# Patient Record
Sex: Male | Born: 1975
Health system: Southern US, Community
[De-identification: ages and names within clinical notes are randomized; demographics above are authoritative.]

## PROBLEM LIST (undated history)

## (undated) ENCOUNTER — Emergency Department (HOSPITAL_COMMUNITY): Payer: BC Managed Care – PPO

## (undated) DIAGNOSIS — C140 Malignant neoplasm of pharynx, unspecified: Secondary | ICD-10-CM

## (undated) DIAGNOSIS — E119 Type 2 diabetes mellitus without complications: Secondary | ICD-10-CM

## (undated) DIAGNOSIS — G4700 Insomnia, unspecified: Secondary | ICD-10-CM

## (undated) DIAGNOSIS — L989 Disorder of the skin and subcutaneous tissue, unspecified: Secondary | ICD-10-CM

## (undated) DIAGNOSIS — M1990 Unspecified osteoarthritis, unspecified site: Secondary | ICD-10-CM

## (undated) DIAGNOSIS — N529 Male erectile dysfunction, unspecified: Secondary | ICD-10-CM

## (undated) DIAGNOSIS — K219 Gastro-esophageal reflux disease without esophagitis: Secondary | ICD-10-CM

## (undated) HISTORY — DX: Male erectile dysfunction, unspecified: N52.9

## (undated) HISTORY — DX: Insomnia, unspecified: G47.00

---

## 2005-09-29 ENCOUNTER — Emergency Department: Payer: Self-pay | Admitting: Emergency Medicine

## 2005-11-01 ENCOUNTER — Emergency Department: Payer: Self-pay | Admitting: Emergency Medicine

## 2010-11-06 ENCOUNTER — Emergency Department (HOSPITAL_COMMUNITY)
Admission: EM | Admit: 2010-11-06 | Discharge: 2010-11-06 | Payer: Self-pay | Source: Home / Self Care | Admitting: Emergency Medicine

## 2014-05-23 ENCOUNTER — Encounter (HOSPITAL_COMMUNITY): Payer: Self-pay | Admitting: Emergency Medicine

## 2014-05-23 ENCOUNTER — Emergency Department (HOSPITAL_COMMUNITY)
Admission: EM | Admit: 2014-05-23 | Discharge: 2014-05-24 | Discharge status: Against Medical Advice | Payer: BC Managed Care – PPO | Attending: Emergency Medicine | Admitting: Emergency Medicine

## 2014-05-23 DIAGNOSIS — H9319 Tinnitus, unspecified ear: Secondary | ICD-10-CM | POA: Insufficient documentation

## 2014-05-23 DIAGNOSIS — R0602 Shortness of breath: Secondary | ICD-10-CM | POA: Insufficient documentation

## 2014-05-23 NOTE — ED Notes (Signed)
Call no answer 

## 2014-05-23 NOTE — ED Notes (Addendum)
Radiology transport came twice to transport pt to xray. Paged pt twice for transport no answer.

## 2014-05-23 NOTE — ED Notes (Signed)
Call and no answer

## 2014-05-23 NOTE — ED Notes (Signed)
Presents with SOb and ringing in ears began approx 2 hours associated with nausea and ringing in ears as well as dizziness that caused patient to pull over while driving. He denies tinnitus and SOB at this time. Alert and orietentd.speaking in full sentences.

## 2015-01-24 ENCOUNTER — Ambulatory Visit (INDEPENDENT_AMBULATORY_CARE_PROVIDER_SITE_OTHER): Payer: BLUE CROSS/BLUE SHIELD | Admitting: Internal Medicine

## 2015-01-24 ENCOUNTER — Encounter: Payer: Self-pay | Admitting: Internal Medicine

## 2015-01-24 DIAGNOSIS — Z7189 Other specified counseling: Secondary | ICD-10-CM | POA: Diagnosis not present

## 2015-01-24 DIAGNOSIS — Z789 Other specified health status: Secondary | ICD-10-CM | POA: Diagnosis not present

## 2015-01-24 DIAGNOSIS — G4725 Circadian rhythm sleep disorder, jet lag type: Secondary | ICD-10-CM

## 2015-01-24 DIAGNOSIS — Z23 Encounter for immunization: Secondary | ICD-10-CM

## 2015-01-24 DIAGNOSIS — Z9189 Other specified personal risk factors, not elsewhere classified: Secondary | ICD-10-CM

## 2015-01-24 DIAGNOSIS — Z7184 Encounter for health counseling related to travel: Secondary | ICD-10-CM

## 2015-01-24 MED ORDER — AZITHROMYCIN 500 MG PO TABS: 500.0000 mg | ORAL_TABLET | Freq: Every day | ORAL | Status: DC

## 2015-01-24 MED ORDER — DIAZEPAM 5 MG PO TABS: 5.0000 mg | ORAL_TABLET | Freq: Every evening | ORAL | Status: DC | PRN

## 2015-01-24 NOTE — Progress Notes (Signed)
Patient ID: Justin Baldwin, male   DOB: Mar 13, 1976, 39 y.o.   MRN: [id number] Subjective:   Justin Baldwin is a 39 y.o. male who presents to the Infectious Disease clinic for travel consultation. Planned departure date: may 1st Planned return date: may 9th Countries of travel: [place], [place] and [place] Areas in country: urban Accommodations:  Company secretary and ryoka Purpose of travel: honeymoon Prior travel out of [place]:  [place], [place], and [place]   Allergies: No Known Allergies   Previously prescribed Lorrin Mais that made him feel too groggy. Has tried melatonin without effect. Has usually taken valium   Prior vaccinations = he has had hep b series   Objective:   Medications: does not take any medication    Assessment:    No contraindications to travel.    Plan:   Traveler's diarrhea = will give rx for azithromycin if needed, gave instructions how to use  Jet lag = valium # 5 tabs, if needed  Pre-travel vaccination = gave rx for hep A, recommended he gets tetanus prior to travel

## 2015-01-31 ENCOUNTER — Encounter: Payer: Self-pay | Admitting: Internal Medicine

## 2016-02-05 DIAGNOSIS — F4323 Adjustment disorder with mixed anxiety and depressed mood: Secondary | ICD-10-CM | POA: Diagnosis not present

## 2016-02-11 DIAGNOSIS — J111 Influenza due to unidentified influenza virus with other respiratory manifestations: Secondary | ICD-10-CM | POA: Diagnosis not present

## 2016-02-16 ENCOUNTER — Ambulatory Visit: Payer: BLUE CROSS/BLUE SHIELD | Admitting: Family Medicine

## 2016-02-23 ENCOUNTER — Ambulatory Visit: Payer: BLUE CROSS/BLUE SHIELD | Admitting: Primary Care

## 2016-03-15 ENCOUNTER — Encounter: Payer: Self-pay | Admitting: Primary Care

## 2016-03-15 ENCOUNTER — Ambulatory Visit (INDEPENDENT_AMBULATORY_CARE_PROVIDER_SITE_OTHER): Payer: BLUE CROSS/BLUE SHIELD | Admitting: Primary Care

## 2016-03-15 VITALS — BP 132/78 | HR 79 | Temp 98.4°F | Ht 72.0 in | Wt 214.4 lb

## 2016-03-15 DIAGNOSIS — G4700 Insomnia, unspecified: Secondary | ICD-10-CM

## 2016-03-15 DIAGNOSIS — N529 Male erectile dysfunction, unspecified: Secondary | ICD-10-CM | POA: Diagnosis not present

## 2016-03-15 MED ORDER — TRAZODONE HCL 50 MG PO TABS: 25.0000 mg | ORAL_TABLET | Freq: Every evening | ORAL | Status: DC | PRN

## 2016-03-15 NOTE — Progress Notes (Signed)
Pre visit review using our clinic review tool, if applicable. No additional management support is needed unless otherwise documented below in the visit note. 

## 2016-03-15 NOTE — Assessment & Plan Note (Signed)
Maintenance insomnia for 4-5 months. No improvement with OTC and conservative treatment. Will initiate low dose Trazodone, discussed potential side effects. He is to communicate with me via My Chart with an update.

## 2016-03-15 NOTE — Patient Instructions (Signed)
Start Trazodone tablets for insomnia. Take 1/2 to 1 full tablet at bedtime.  Please e-mail me with an update in several weeks. If you do not notice improvement with 1 tablet, then you may increase to taking 2 tablets.  Please schedule a physical with me within the next 3-6 months. You may also schedule a lab only appointment 3-4 days prior. We will discuss your lab results in detail during your physical.  It was a pleasure to meet you today! Please don't hesitate to call me with any questions. Welcome to [facility]!

## 2016-03-15 NOTE — Progress Notes (Signed)
Subjective:    Patient ID: Justin Baldwin, male    DOB: Dec 26, 1975, 40 y.o.   MRN: [id number]  HPI  Mr. Ajello is a 40 year old male who presents today to establish care and discuss the problems mentioned below. Will obtain old records. His last physical was several years ago.   1) Insomnia: He has no difficulty falling asleep, but will wake up often. He falls asleep at 10pm, wakes at 2:30am, cannot sleep for 1-2 hours, falls back asleep, and wake up again prior to his alarm. This has been ongoing for the past 4-5 months. He denies increased stress or anxiety, caffeine consumption prior to bed, changes in medications or food. He will watch TV prior to falling asleep and has always done without problems in sleep.   He's tried Melatonin, magnesium, vitamins and supplements without much improvement. He's never been on prescription medication for his sleep. Denies snoring, periods of sleep apnea, daytime napping.  2) Erectile Dysfunction: Present for the past 10 years. Currently following with Urology. Managed on Sildenafil 20 mg tablets. He takes the Sildenafil occasionally, not each time he has intercourse.  Review of Systems  Constitutional: Negative for unexpected weight change.  Respiratory: Negative for shortness of breath.   Cardiovascular: Negative for chest pain.  Genitourinary:       Erectile dysfunction  Musculoskeletal: Negative for myalgias and arthralgias.  Allergic/Immunologic: Negative for environmental allergies.  Psychiatric/Behavioral: Positive for sleep disturbance.       Past Medical History  Diagnosis Date  . Erectile dysfunction   . Insomnia      Social History   Social History  . Marital Status: Married    Spouse Name: N/A  . Number of Children: N/A  . Years of Education: N/A   Occupational History  . Not on file.   Social History Main Topics  . Smoking status: Former Smoker    Types: Cigarettes  . Smokeless tobacco: Former Systems developer    Quit date:  02/14/2016  . Alcohol Use: 0.0 oz/week    0 Standard drinks or equivalent per week     Comment: rarely  . Drug Use: No  . Sexual Activity: Not on file   Other Topics Concern  . Not on file   Social History Narrative   Married.   2 children.   Works as a Nutritional therapist   Enjoys spending time with family.     No past surgical history on file.  Family History  Problem Relation Age of Onset  . Heart attack Father   . Emphysema Paternal Grandmother   . Arthritis Mother     No Known Allergies  No current outpatient prescriptions on file prior to visit.   No current facility-administered medications on file prior to visit.    BP 132/78 mmHg  Pulse 79  Temp(Src) 98.4 F (36.9 C) (Oral)  Ht 6' (1.829 m)  Wt 214 lb 6.4 oz (97.251 kg)  BMI 29.07 kg/m2  SpO2 97%    Objective:   Physical Exam  Constitutional: He is oriented to person, place, and time. He appears well-nourished.  Neck: Neck supple.  Cardiovascular: Normal rate and regular rhythm.   Pulmonary/Chest: Effort normal and breath sounds normal. He has no wheezes. He has no rales.  Neurological: He is alert and oriented to person, place, and time.  Skin: Skin is warm and dry.  Psychiatric: He has a normal mood and affect.          Assessment &  Plan:

## 2016-03-15 NOTE — Assessment & Plan Note (Signed)
Manages with Urology, takes Sildenafil 20 mg PRN.

## 2016-03-18 DIAGNOSIS — F4323 Adjustment disorder with mixed anxiety and depressed mood: Secondary | ICD-10-CM | POA: Diagnosis not present

## 2016-03-29 ENCOUNTER — Ambulatory Visit (INDEPENDENT_AMBULATORY_CARE_PROVIDER_SITE_OTHER): Payer: BLUE CROSS/BLUE SHIELD | Admitting: Primary Care

## 2016-03-29 VITALS — BP 134/82 | HR 82 | Temp 98.9°F | Ht 72.0 in | Wt 215.6 lb

## 2016-03-29 DIAGNOSIS — G4700 Insomnia, unspecified: Secondary | ICD-10-CM

## 2016-03-29 DIAGNOSIS — Z87438 Personal history of other diseases of male genital organs: Secondary | ICD-10-CM

## 2016-03-29 DIAGNOSIS — Z0000 Encounter for general adult medical examination without abnormal findings: Secondary | ICD-10-CM | POA: Diagnosis not present

## 2016-03-29 LAB — COMPREHENSIVE METABOLIC PANEL
ALT: 113 U/L — AB (ref 0–53)
AST: 72 U/L — AB (ref 0–37)
Albumin: 4.1 g/dL (ref 3.5–5.2)
Alkaline Phosphatase: 56 U/L (ref 39–117)
BILIRUBIN TOTAL: 0.6 mg/dL (ref 0.2–1.2)
BUN: 19 mg/dL (ref 6–23)
CALCIUM: 9.4 mg/dL (ref 8.4–10.5)
CHLORIDE: 101 meq/L (ref 96–112)
CO2: 31 mEq/L (ref 19–32)
CREATININE: 1.4 mg/dL (ref 0.40–1.50)
GFR: 59.73 mL/min — ABNORMAL LOW (ref >60.00)
Glucose, Bld: 95 mg/dL (ref 70–99)
Potassium: 4.7 mEq/L (ref 3.5–5.1)
Sodium: 139 mEq/L (ref 135–145)
Total Protein: 6.7 g/dL (ref 6.0–8.3)

## 2016-03-29 LAB — LIPID PANEL
CHOLESTEROL: 285 mg/dL — AB (ref 0–200)
HDL: 15.7 mg/dL — ABNORMAL LOW (ref >39.00)
LDL Cholesterol: 230 mg/dL — ABNORMAL HIGH (ref 0–99)
NonHDL: 269.53
Total CHOL/HDL Ratio: 18
Triglycerides: 200 mg/dL — ABNORMAL HIGH (ref 0.0–149.0)
VLDL: 40 mg/dL (ref 0.0–40.0)

## 2016-03-29 LAB — HEMOGLOBIN A1C: HEMOGLOBIN A1C: 4.9 % (ref 4.6–6.5)

## 2016-03-29 LAB — PSA: PSA: 1.17 ng/mL (ref 0.10–4.00)

## 2016-03-29 NOTE — Assessment & Plan Note (Signed)
Td UTD. Labs pending. Will obtain PSA given recurrent prostatitis history.  Exam unremarkable. Encouraged him to increase water consumption, especially given his regular exercise. Healthy diet overall.  Follow up in 1 year for repeat physical or sooner if needed.

## 2016-03-29 NOTE — Progress Notes (Signed)
Subjective:    Patient ID: Justin Baldwin, male    DOB: 1976-08-03, 40 y.o.   MRN: [id number]  HPI  Justin Baldwin is a 40 year old male who presents today for complete physical.  Immunizations: -Tetanus: Completed in May 2016, he believes. -Influenza: Did not complete last season.    Diet: He endorses a healthy diet.  Breakfast: Skips Lunch: Fruit, protein drink, protein bar, lunch meat, peanuts, cheese Dinner: Meat, 2 vegetables Snacks: Protein bar Desserts: Occasionally  Beverages: Water, selezer water  Exercise: He exercises 6 days weekly for 1.5 hours. Eye exam: Completed in 2016, no changes in prescription. Dental exam: Completed in 2017.   Review of Systems  Constitutional: Negative for unexpected weight change.  HENT: Negative for rhinorrhea.   Respiratory: Negative for cough and shortness of breath.   Cardiovascular: Negative for chest pain.  Gastrointestinal: Positive for constipation. Negative for diarrhea.       Bowel movements every 2-3 days.  Genitourinary: Negative for discharge, difficulty urinating and testicular pain.       History of acute prostatitis since 20's. History of epididymitis.   Musculoskeletal: Negative for myalgias and arthralgias.  Skin: Negative for rash.  Allergic/Immunologic: Negative for environmental allergies.  Neurological: Negative for dizziness, numbness and headaches.  Psychiatric/Behavioral:       Denies concerns for anxiety or depression       Past Medical History  Diagnosis Date  . Erectile dysfunction   . Insomnia      Social History   Social History  . Marital Status: Married    Spouse Name: N/A  . Number of Children: N/A  . Years of Education: N/A   Occupational History  . Not on file.   Social History Main Topics  . Smoking status: Former Smoker    Types: Cigarettes  . Smokeless tobacco: Former Systems developer    Quit date: 02/14/2016  . Alcohol Use: 0.0 oz/week    0 Standard drinks or equivalent per week   Comment: rarely  . Drug Use: No  . Sexual Activity: Not on file   Other Topics Concern  . Not on file   Social History Narrative   Married.   2 children.   Works as a Nutritional therapist   Enjoys spending time with family.     No past surgical history on file.  Family History  Problem Relation Age of Onset  . Heart attack Father   . Emphysema Paternal Grandmother   . Arthritis Mother     No Known Allergies  Current Outpatient Prescriptions on File Prior to Visit  Medication Sig Dispense Refill  . sildenafil (REVATIO) 20 MG tablet Take 20 mg by mouth 3 (three) times daily. Take 1 tablet by mouth prior to intercourse    . traZODone (DESYREL) 50 MG tablet Take 0.5-1 tablets (25-50 mg total) by mouth at bedtime as needed for sleep. 30 tablet 3   No current facility-administered medications on file prior to visit.    BP 134/82 mmHg  Pulse 82  Temp(Src) 98.9 F (37.2 C) (Oral)  Ht 6' (1.829 m)  Wt 215 lb 9.6 oz (97.796 kg)  BMI 29.23 kg/m2  SpO2 97%    Objective:   Physical Exam  Constitutional: He is oriented to person, place, and time. He appears well-nourished.  HENT:  Right Ear: Tympanic membrane and ear canal normal.  Left Ear: Tympanic membrane and ear canal normal.  Nose: Nose normal. Right sinus exhibits no maxillary sinus tenderness and no  frontal sinus tenderness. Left sinus exhibits no maxillary sinus tenderness and no frontal sinus tenderness.  Mouth/Throat: Oropharynx is clear and moist.  Eyes: Conjunctivae and EOM are normal. Pupils are equal, round, and reactive to light.  Neck: Neck supple. Carotid bruit is not present. No thyromegaly present.  Cardiovascular: Normal rate, regular rhythm and normal heart sounds.   Pulmonary/Chest: Effort normal and breath sounds normal. He has no wheezes. He has no rales.  Abdominal: Soft. Bowel sounds are normal. There is no tenderness.  Genitourinary: Penis normal. No penile tenderness.  Small 1/8 size circular nevus to  skin connecting scrotum to penis. Appears benign.  Chaperone present.  Musculoskeletal: Normal range of motion.  Neurological: He is alert and oriented to person, place, and time. He has normal reflexes. No cranial nerve deficit.  Skin: Skin is warm and dry.  Psychiatric: He has a normal mood and affect.          Assessment & Plan:

## 2016-03-29 NOTE — Progress Notes (Signed)
Pre visit review using our clinic review tool, if applicable. No additional management support is needed unless otherwise documented below in the visit note. 

## 2016-03-29 NOTE — Assessment & Plan Note (Signed)
Improved on 75 mg of Trazodone, however feels groggy the next day. Will have him take 50 mg with melatonin. He is to update me if no improvement.

## 2016-03-29 NOTE — Patient Instructions (Signed)
Complete lab work prior to leaving today. I will notify you of your results once received.   Ensure you are consuming 64 ounces of water daily, especially since you are eating increased quantities of fiber.  Follow up in 1 year for repeat physical or sooner if needed.  It was a pleasure to see you today!

## 2016-03-31 DIAGNOSIS — A630 Anogenital (venereal) warts: Secondary | ICD-10-CM | POA: Diagnosis not present

## 2016-04-22 DIAGNOSIS — F4323 Adjustment disorder with mixed anxiety and depressed mood: Secondary | ICD-10-CM | POA: Diagnosis not present

## 2016-05-06 DIAGNOSIS — F4323 Adjustment disorder with mixed anxiety and depressed mood: Secondary | ICD-10-CM | POA: Diagnosis not present

## 2016-05-10 DIAGNOSIS — N5201 Erectile dysfunction due to arterial insufficiency: Secondary | ICD-10-CM | POA: Diagnosis not present

## 2016-05-10 DIAGNOSIS — R339 Retention of urine, unspecified: Secondary | ICD-10-CM | POA: Diagnosis not present

## 2016-05-10 DIAGNOSIS — Z6830 Body mass index (BMI) 30.0-30.9, adult: Secondary | ICD-10-CM | POA: Diagnosis not present

## 2016-05-20 DIAGNOSIS — F4323 Adjustment disorder with mixed anxiety and depressed mood: Secondary | ICD-10-CM | POA: Diagnosis not present

## 2016-05-25 DIAGNOSIS — L821 Other seborrheic keratosis: Secondary | ICD-10-CM | POA: Diagnosis not present

## 2016-05-25 DIAGNOSIS — L080 Pyoderma: Secondary | ICD-10-CM | POA: Diagnosis not present

## 2016-05-25 DIAGNOSIS — L089 Local infection of the skin and subcutaneous tissue, unspecified: Secondary | ICD-10-CM | POA: Diagnosis not present

## 2016-05-25 DIAGNOSIS — D239 Other benign neoplasm of skin, unspecified: Secondary | ICD-10-CM | POA: Diagnosis not present

## 2016-06-04 DIAGNOSIS — Z79899 Other long term (current) drug therapy: Secondary | ICD-10-CM | POA: Diagnosis not present

## 2016-06-04 DIAGNOSIS — L709 Acne, unspecified: Secondary | ICD-10-CM | POA: Diagnosis not present

## 2016-07-20 DIAGNOSIS — L709 Acne, unspecified: Secondary | ICD-10-CM | POA: Diagnosis not present

## 2016-07-20 DIAGNOSIS — Z5181 Encounter for therapeutic drug level monitoring: Secondary | ICD-10-CM | POA: Diagnosis not present

## 2016-07-20 DIAGNOSIS — Z79899 Other long term (current) drug therapy: Secondary | ICD-10-CM | POA: Diagnosis not present

## 2016-07-29 DIAGNOSIS — F4323 Adjustment disorder with mixed anxiety and depressed mood: Secondary | ICD-10-CM | POA: Diagnosis not present

## 2016-08-12 DIAGNOSIS — F4323 Adjustment disorder with mixed anxiety and depressed mood: Secondary | ICD-10-CM | POA: Diagnosis not present

## 2016-08-25 DIAGNOSIS — Z79899 Other long term (current) drug therapy: Secondary | ICD-10-CM | POA: Diagnosis not present

## 2016-08-25 DIAGNOSIS — L732 Hidradenitis suppurativa: Secondary | ICD-10-CM | POA: Diagnosis not present

## 2016-09-29 DIAGNOSIS — L732 Hidradenitis suppurativa: Secondary | ICD-10-CM | POA: Diagnosis not present

## 2016-09-29 DIAGNOSIS — L700 Acne vulgaris: Secondary | ICD-10-CM | POA: Diagnosis not present

## 2016-11-02 DIAGNOSIS — L732 Hidradenitis suppurativa: Secondary | ICD-10-CM | POA: Diagnosis not present

## 2016-11-02 DIAGNOSIS — L709 Acne, unspecified: Secondary | ICD-10-CM | POA: Diagnosis not present

## 2016-12-08 DIAGNOSIS — L732 Hidradenitis suppurativa: Secondary | ICD-10-CM | POA: Diagnosis not present

## 2016-12-08 DIAGNOSIS — L700 Acne vulgaris: Secondary | ICD-10-CM | POA: Diagnosis not present

## 2016-12-21 DIAGNOSIS — R300 Dysuria: Secondary | ICD-10-CM | POA: Diagnosis not present

## 2016-12-21 DIAGNOSIS — Z113 Encounter for screening for infections with a predominantly sexual mode of transmission: Secondary | ICD-10-CM | POA: Diagnosis not present

## 2017-01-11 DIAGNOSIS — L700 Acne vulgaris: Secondary | ICD-10-CM | POA: Diagnosis not present

## 2017-01-11 DIAGNOSIS — L709 Acne, unspecified: Secondary | ICD-10-CM | POA: Diagnosis not present

## 2017-01-11 DIAGNOSIS — F4325 Adjustment disorder with mixed disturbance of emotions and conduct: Secondary | ICD-10-CM | POA: Diagnosis not present

## 2017-01-14 ENCOUNTER — Encounter: Payer: BLUE CROSS/BLUE SHIELD | Admitting: Internal Medicine

## 2017-01-20 DIAGNOSIS — F4325 Adjustment disorder with mixed disturbance of emotions and conduct: Secondary | ICD-10-CM | POA: Diagnosis not present

## 2017-01-24 DIAGNOSIS — F4325 Adjustment disorder with mixed disturbance of emotions and conduct: Secondary | ICD-10-CM | POA: Diagnosis not present

## 2017-01-27 DIAGNOSIS — F4325 Adjustment disorder with mixed disturbance of emotions and conduct: Secondary | ICD-10-CM | POA: Diagnosis not present

## 2017-01-28 ENCOUNTER — Encounter: Payer: BLUE CROSS/BLUE SHIELD | Admitting: Internal Medicine

## 2017-02-04 DIAGNOSIS — F4325 Adjustment disorder with mixed disturbance of emotions and conduct: Secondary | ICD-10-CM | POA: Diagnosis not present

## 2017-02-10 DIAGNOSIS — F4325 Adjustment disorder with mixed disturbance of emotions and conduct: Secondary | ICD-10-CM | POA: Diagnosis not present

## 2017-02-15 DIAGNOSIS — Z79899 Other long term (current) drug therapy: Secondary | ICD-10-CM | POA: Diagnosis not present

## 2017-02-15 DIAGNOSIS — L700 Acne vulgaris: Secondary | ICD-10-CM | POA: Diagnosis not present

## 2017-02-15 DIAGNOSIS — L910 Hypertrophic scar: Secondary | ICD-10-CM | POA: Diagnosis not present

## 2017-02-18 DIAGNOSIS — F4325 Adjustment disorder with mixed disturbance of emotions and conduct: Secondary | ICD-10-CM | POA: Diagnosis not present

## 2017-02-25 DIAGNOSIS — F4325 Adjustment disorder with mixed disturbance of emotions and conduct: Secondary | ICD-10-CM | POA: Diagnosis not present

## 2017-02-28 DIAGNOSIS — N5201 Erectile dysfunction due to arterial insufficiency: Secondary | ICD-10-CM | POA: Diagnosis not present

## 2017-02-28 DIAGNOSIS — Z6832 Body mass index (BMI) 32.0-32.9, adult: Secondary | ICD-10-CM | POA: Diagnosis not present

## 2017-03-04 DIAGNOSIS — F4325 Adjustment disorder with mixed disturbance of emotions and conduct: Secondary | ICD-10-CM | POA: Diagnosis not present

## 2017-03-07 DIAGNOSIS — F4322 Adjustment disorder with anxiety: Secondary | ICD-10-CM | POA: Diagnosis not present

## 2017-03-10 DIAGNOSIS — F4325 Adjustment disorder with mixed disturbance of emotions and conduct: Secondary | ICD-10-CM | POA: Diagnosis not present

## 2017-03-14 DIAGNOSIS — F4322 Adjustment disorder with anxiety: Secondary | ICD-10-CM | POA: Diagnosis not present

## 2017-03-22 DIAGNOSIS — L709 Acne, unspecified: Secondary | ICD-10-CM | POA: Diagnosis not present

## 2017-03-24 DIAGNOSIS — F4325 Adjustment disorder with mixed disturbance of emotions and conduct: Secondary | ICD-10-CM | POA: Diagnosis not present

## 2017-03-28 DIAGNOSIS — F4322 Adjustment disorder with anxiety: Secondary | ICD-10-CM | POA: Diagnosis not present

## 2017-04-04 DIAGNOSIS — F4322 Adjustment disorder with anxiety: Secondary | ICD-10-CM | POA: Diagnosis not present

## 2017-04-08 DIAGNOSIS — F4325 Adjustment disorder with mixed disturbance of emotions and conduct: Secondary | ICD-10-CM | POA: Diagnosis not present

## 2017-04-18 DIAGNOSIS — F4322 Adjustment disorder with anxiety: Secondary | ICD-10-CM | POA: Diagnosis not present

## 2017-04-21 DIAGNOSIS — F4325 Adjustment disorder with mixed disturbance of emotions and conduct: Secondary | ICD-10-CM | POA: Diagnosis not present

## 2017-04-25 DIAGNOSIS — F4322 Adjustment disorder with anxiety: Secondary | ICD-10-CM | POA: Diagnosis not present

## 2017-04-26 DIAGNOSIS — L723 Sebaceous cyst: Secondary | ICD-10-CM | POA: Diagnosis not present

## 2017-04-26 DIAGNOSIS — L709 Acne, unspecified: Secondary | ICD-10-CM | POA: Diagnosis not present

## 2017-05-02 DIAGNOSIS — F4322 Adjustment disorder with anxiety: Secondary | ICD-10-CM | POA: Diagnosis not present

## 2017-05-05 DIAGNOSIS — F4325 Adjustment disorder with mixed disturbance of emotions and conduct: Secondary | ICD-10-CM | POA: Diagnosis not present

## 2017-05-09 DIAGNOSIS — F4322 Adjustment disorder with anxiety: Secondary | ICD-10-CM | POA: Diagnosis not present

## 2017-05-11 ENCOUNTER — Ambulatory Visit (INDEPENDENT_AMBULATORY_CARE_PROVIDER_SITE_OTHER): Payer: BLUE CROSS/BLUE SHIELD | Admitting: Primary Care

## 2017-05-11 VITALS — BP 126/84 | HR 70 | Temp 98.4°F | Ht 72.0 in | Wt 236.4 lb

## 2017-05-11 DIAGNOSIS — Z2089 Contact with and (suspected) exposure to other communicable diseases: Secondary | ICD-10-CM

## 2017-05-11 DIAGNOSIS — R059 Cough, unspecified: Secondary | ICD-10-CM

## 2017-05-11 DIAGNOSIS — R05 Cough: Secondary | ICD-10-CM

## 2017-05-11 DIAGNOSIS — Z20828 Contact with and (suspected) exposure to other viral communicable diseases: Secondary | ICD-10-CM | POA: Diagnosis not present

## 2017-05-11 MED ORDER — AZITHROMYCIN 250 MG PO TABS: ORAL_TABLET | ORAL | 0 refills | Status: DC

## 2017-05-11 NOTE — Progress Notes (Signed)
Subjective:    Patient ID: Justin Baldwin, male    DOB: 06-04-1976, 41 y.o.   MRN: [id number]  HPI  Mr. Rawl is a 41 year old male who presents today with a chief complaint of cough. He also reports nasal congestion, voice hoarseness, burning in his chest. His symptoms began 3-4 days ago. His 41 year old daughter was diagnosed with walking pneumonia on Monday this week and was treated. Her daughter has been coughing for 2 weeks. His cough is non productive. He denies fevers. He's also managed on Humira. He will be leaving for the beach next week.  Review of Systems  Constitutional: Positive for chills and fatigue. Negative for fever.  HENT: Positive for congestion. Negative for ear pain and sore throat.   Respiratory: Positive for cough. Negative for shortness of breath and wheezing.        Past Medical History:  Diagnosis Date  . Erectile dysfunction   . Insomnia      Social History   Social History  . Marital status: Married    Spouse name: N/A  . Number of children: N/A  . Years of education: N/A   Occupational History  . Not on file.   Social History Main Topics  . Smoking status: Former Smoker    Types: Cigarettes  . Smokeless tobacco: Former Systems developer    Quit date: 02/14/2016  . Alcohol use 0.0 oz/week     Comment: rarely  . Drug use: No  . Sexual activity: Not on file   Other Topics Concern  . Not on file   Social History Narrative   Married.   2 children.   Works as a Nutritional therapist   Enjoys spending time with family.     No past surgical history on file.  Family History  Problem Relation Age of Onset  . Heart attack Father   . Emphysema Paternal Grandmother   . Arthritis Mother     No Known Allergies  Current Outpatient Prescriptions on File Prior to Visit  Medication Sig Dispense Refill  . traZODone (DESYREL) 50 MG tablet Take 0.5-1 tablets (25-50 mg total) by mouth at bedtime as needed for sleep. (Patient not taking: Reported on 05/11/2017) 30  tablet 3   No current facility-administered medications on file prior to visit.     BP 126/84   Pulse 70   Temp 98.4 F (36.9 C) (Oral)   Ht 6' (1.829 m)   Wt 236 lb 6.4 oz (107.2 kg)   SpO2 98%   BMI 32.06 kg/m    Objective:   Physical Exam  Constitutional: He appears well-nourished. He appears ill.  HENT:  Right Ear: Tympanic membrane and ear canal normal.  Left Ear: Tympanic membrane and ear canal normal.  Nose: No mucosal edema. Right sinus exhibits no maxillary sinus tenderness and no frontal sinus tenderness. Left sinus exhibits no maxillary sinus tenderness and no frontal sinus tenderness.  Mouth/Throat: Oropharynx is clear and moist.  Eyes: Conjunctivae are normal.  Neck: Neck supple.  Cardiovascular: Normal rate and regular rhythm.   Pulmonary/Chest: Effort normal and breath sounds normal. He has no wheezes. He has no rales.  Skin: Skin is warm and dry.          Assessment & Plan:  URI vs Early Pneumonia:  Exposed to pneumonia by daughter who's been coughing for 2 weeks. He's now feeling worse, managed on Humira.  Exam today overall unremarkable, does appear ill. Given exposure, presentation, and Humira will  treat. Rx for Zpak sent to pharmacy. Discussed fluids, rest, follow up PRN.  Sheral Flow, NP

## 2017-05-11 NOTE — Patient Instructions (Signed)
Start Azithromycin antibiotics. Take 2 tablets by mouth today, then 1 tablet daily for 4 additional days.  Try Delsym or Robitussin over the counter as needed for cough.  Try Mucinex for chest congestion.  Ensure you are staying hydrated and rest.  It was a pleasure to see you today!

## 2017-05-23 DIAGNOSIS — F4325 Adjustment disorder with mixed disturbance of emotions and conduct: Secondary | ICD-10-CM | POA: Diagnosis not present

## 2017-05-26 DIAGNOSIS — F4325 Adjustment disorder with mixed disturbance of emotions and conduct: Secondary | ICD-10-CM | POA: Diagnosis not present

## 2017-05-31 DIAGNOSIS — L732 Hidradenitis suppurativa: Secondary | ICD-10-CM | POA: Diagnosis not present

## 2017-05-31 DIAGNOSIS — L700 Acne vulgaris: Secondary | ICD-10-CM | POA: Diagnosis not present

## 2017-06-06 DIAGNOSIS — F4322 Adjustment disorder with anxiety: Secondary | ICD-10-CM | POA: Diagnosis not present

## 2017-06-09 DIAGNOSIS — F4325 Adjustment disorder with mixed disturbance of emotions and conduct: Secondary | ICD-10-CM | POA: Diagnosis not present

## 2017-06-13 DIAGNOSIS — F4322 Adjustment disorder with anxiety: Secondary | ICD-10-CM | POA: Diagnosis not present

## 2017-06-24 DIAGNOSIS — F4325 Adjustment disorder with mixed disturbance of emotions and conduct: Secondary | ICD-10-CM | POA: Diagnosis not present

## 2017-06-30 DIAGNOSIS — F4325 Adjustment disorder with mixed disturbance of emotions and conduct: Secondary | ICD-10-CM | POA: Diagnosis not present

## 2017-07-04 DIAGNOSIS — F4322 Adjustment disorder with anxiety: Secondary | ICD-10-CM | POA: Diagnosis not present

## 2017-07-05 ENCOUNTER — Emergency Department (HOSPITAL_COMMUNITY)
Admission: EM | Admit: 2017-07-05 | Discharge: 2017-07-05 | Disposition: A | Discharge status: Home / Self Care | Payer: BLUE CROSS/BLUE SHIELD | Attending: Emergency Medicine | Admitting: Emergency Medicine

## 2017-07-05 ENCOUNTER — Emergency Department (HOSPITAL_COMMUNITY): Payer: BLUE CROSS/BLUE SHIELD

## 2017-07-05 ENCOUNTER — Encounter (HOSPITAL_COMMUNITY): Payer: Self-pay | Admitting: Cardiology

## 2017-07-05 DIAGNOSIS — H8112 Benign paroxysmal vertigo, left ear: Secondary | ICD-10-CM | POA: Diagnosis not present

## 2017-07-05 DIAGNOSIS — R110 Nausea: Secondary | ICD-10-CM | POA: Insufficient documentation

## 2017-07-05 DIAGNOSIS — F17210 Nicotine dependence, cigarettes, uncomplicated: Secondary | ICD-10-CM | POA: Insufficient documentation

## 2017-07-05 DIAGNOSIS — Z79899 Other long term (current) drug therapy: Secondary | ICD-10-CM | POA: Diagnosis not present

## 2017-07-05 DIAGNOSIS — H81393 Other peripheral vertigo, bilateral: Secondary | ICD-10-CM | POA: Diagnosis not present

## 2017-07-05 DIAGNOSIS — R42 Dizziness and giddiness: Secondary | ICD-10-CM | POA: Diagnosis not present

## 2017-07-05 DIAGNOSIS — J3489 Other specified disorders of nose and nasal sinuses: Secondary | ICD-10-CM | POA: Diagnosis not present

## 2017-07-05 HISTORY — DX: Disorder of the skin and subcutaneous tissue, unspecified: L98.9

## 2017-07-05 MED ORDER — ONDANSETRON 4 MG PO TBDP: 4.0000 mg | ORAL_TABLET | Freq: Three times a day (TID) | ORAL | 0 refills | Status: DC | PRN

## 2017-07-05 MED ORDER — DIAZEPAM 5 MG PO TABS: 5.0000 mg | ORAL_TABLET | Freq: Two times a day (BID) | ORAL | 0 refills | Status: DC | PRN

## 2017-07-05 MED ORDER — MECLIZINE HCL 25 MG PO TABS: 25.0000 mg | ORAL_TABLET | Freq: Three times a day (TID) | ORAL | 0 refills | Status: DC | PRN

## 2017-07-05 MED ORDER — ONDANSETRON HCL 4 MG/2ML IJ SOLN
4.0000 mg | Freq: Once | INTRAMUSCULAR | Status: AC
  Administered 2017-07-05: 4 mg via INTRAVENOUS
  Filled 2017-07-05: qty 2

## 2017-07-05 MED ORDER — FLUTICASONE PROPIONATE 50 MCG/ACT NA SUSP: NASAL | 1 refills | Status: DC

## 2017-07-05 MED ORDER — ONDANSETRON 4 MG PO TBDP
ORAL_TABLET | ORAL | Status: AC
  Filled 2017-07-05: qty 1

## 2017-07-05 MED ORDER — MECLIZINE HCL 12.5 MG PO TABS
25.0000 mg | ORAL_TABLET | Freq: Once | ORAL | Status: AC
  Administered 2017-07-05: 25 mg via ORAL
  Filled 2017-07-05: qty 2

## 2017-07-05 MED ORDER — DIPHENHYDRAMINE HCL 50 MG/ML IJ SOLN
25.0000 mg | Freq: Once | INTRAMUSCULAR | Status: AC
  Administered 2017-07-05: 25 mg via INTRAVENOUS
  Filled 2017-07-05: qty 1

## 2017-07-05 MED ORDER — ONDANSETRON 4 MG PO TBDP: 4.0000 mg | ORAL_TABLET | Freq: Once | ORAL | Status: DC

## 2017-07-05 NOTE — ED Triage Notes (Signed)
Dizziness since last night at 730 pm.  Denies any pain.

## 2017-07-05 NOTE — ED Provider Notes (Signed)
[facility] DEPT Provider Note   CSN: [id number] Arrival date & time: 07/05/17  1006     History   Chief Complaint Chief Complaint  Patient presents with  . Dizziness    HPI Justin Baldwin is a 41 y.o. male. Chief complaint is dizziness, nausea  HPI:  41 year old male. History of hidradenitis supra T Eva. He is on Humira for this. He was in his normal state of health until a few hours ago. He states he hasn't felt a runny nose and some cold symptoms. He states he "sniffled hard". He self sudden rush her in his ears into his face. He developed sudden severe dizziness. This described as like things are spinning around. He can look to his left. Awakens to move his head arrives to his right he has worsening of symptoms. No headache. No neck pain. No recent fever. Has some seasonal allergies. Has not had other more significant URI symptoms until his episode today.  Past Medical History:  Diagnosis Date  . Erectile dysfunction   . Insomnia   . Skin abnormality    hidradenitis suppurativa     Patient Active Problem List   Diagnosis Date Noted  . Preventative health care 03/29/2016  . Insomnia 03/15/2016  . Erectile dysfunction 03/15/2016    History reviewed. No pertinent surgical history.     Home Medications    Prior to Admission medications   Medication Sig Start Date End Date Taking? Authorizing Provider  Adalimumab (HUMIRA) 40 MG/0.8ML PSKT Inject 40 mg into the skin.   Yes [provider]  CLARAVIS 40 MG capsule Take 1 capsule every other day 06/04/17  Yes [provider]  azithromycin (ZITHROMAX) 250 MG tablet Take 2 tablets by mouth today, then 1 tablet daily for 4 additional days. Patient not taking: Reported on 07/05/2017 05/11/17   Pleas [name], NP  diazepam (VALIUM) 5 MG tablet Take 1 tablet (5 mg total) by mouth every 12 (twelve) hours as needed (vertigo). 07/05/17   Tanna Furry, MD  fluticasone Asencion Islam) 50 MCG/ACT nasal spray 1 spray each  nares bid 07/05/17   Tanna Furry, MD  meclizine (ANTIVERT) 25 MG tablet Take 1 tablet (25 mg total) by mouth 3 (three) times daily as needed. 07/05/17   Tanna Furry, MD  ondansetron (ZOFRAN ODT) 4 MG disintegrating tablet Take 1 tablet (4 mg total) by mouth every 8 (eight) hours as needed for nausea. 07/05/17   Tanna Furry, MD    Family History Family History  Problem Relation Age of Onset  . Heart attack Father   . Emphysema Paternal Grandmother   . Arthritis Mother     Social History Social History  Substance Use Topics  . Smoking status: Current Every Day Smoker    Types: Cigarettes  . Smokeless tobacco: Former Systems developer    Quit date: 02/14/2016  . Alcohol use 0.0 oz/week     Comment: occasinal      Allergies   Patient has no known allergies.   Review of Systems Review of Systems  Constitutional: Negative for appetite change, chills, diaphoresis, fatigue and fever.  HENT: Positive for postnasal drip, rhinorrhea and sinus pain. Negative for mouth sores, sore throat and trouble swallowing.   Eyes: Negative for visual disturbance.  Respiratory: Negative for cough, chest tightness, shortness of breath and wheezing.   Cardiovascular: Negative for chest pain.  Gastrointestinal: Positive for nausea. Negative for abdominal distention, abdominal pain, diarrhea and vomiting.  Endocrine: Negative for polydipsia, polyphagia and polyuria.  Genitourinary:  Negative for dysuria, frequency and hematuria.  Musculoskeletal: Negative for gait problem.  Skin: Negative for color change, pallor and rash.  Neurological: Positive for dizziness. Negative for syncope, light-headedness and headaches.  Hematological: Does not bruise/bleed easily.  Psychiatric/Behavioral: Negative for behavioral problems and confusion.     Physical Exam Updated Vital Signs BP 134/86   Pulse 67   Temp 98.5 F (36.9 C) (Oral)   Resp 16   Ht 6' (1.829 m)   Wt 104.8 kg (231 lb)   SpO2 97%   BMI 31.33 kg/m    Physical Exam  Constitutional: He is oriented to person, place, and time. He appears well-developed and well-nourished. No distress.  HENT:  Head: Normocephalic.  TMs normal. Nares clear. Pharynx benign. He does not have excellent air movement palsy's. He does have nystagmus to lateral to lateral gaze. Horizontal. No vertical or rotatory nystagmus. No additional cranial nerve symptoms.  Eyes: Pupils are equal, round, and reactive to light. Conjunctivae are normal. No scleral icterus.  Neck: Normal range of motion. Neck supple. No thyromegaly present.  Cardiovascular: Normal rate and regular rhythm.  Exam reveals no gallop and no friction rub.   No murmur heard. Pulmonary/Chest: Effort normal and breath sounds normal. No respiratory distress. He has no wheezes. He has no rales.  Abdominal: Soft. Bowel sounds are normal. He exhibits no distension. There is no tenderness. There is no rebound.  Musculoskeletal: Normal range of motion.  Neurological: He is alert and oriented to person, place, and time.  Skin: Skin is warm and dry. No rash noted.  Psychiatric: He has a normal mood and affect. His behavior is normal.     ED Treatments / Results  Labs (all labs ordered are listed, but only abnormal results are displayed) Labs Reviewed - No data to display  EKG  EKG Interpretation None       Radiology Ct Head Wo Contrast  Result Date: 07/05/2017 CLINICAL DATA:  Vertigo.  Dizziness and facial pain. EXAM: CT HEAD WITHOUT CONTRAST CT MAXILLOFACIAL WITHOUT CONTRAST TECHNIQUE: Multidetector CT imaging of the head and maxillofacial structures were performed using the standard protocol without intravenous contrast. Multiplanar CT image reconstructions of the maxillofacial structures were also generated. COMPARISON:  None. FINDINGS: CT HEAD FINDINGS Brain: The brainstem, cerebellum, cerebral peduncles, thalami, basal ganglia, basilar cisterns, and ventricular system appear within normal limits.  No intracranial hemorrhage, mass lesion, or acute CVA. Vascular: Unremarkable Skull: Unremarkable. No cerebellopontine angle mass or obvious asymmetry of the internal auditory canals. Other: No supplemental non-categorized findings. CT MAXILLOFACIAL FINDINGS Osseous: I suspect a small remote left medial orbital wall fracture, images 29-30 series 7. Not thought to be acute. Orbits: Unremarkable Sinuses: Small bilateral mastoid effusions, right greater than left. No middle ear fluid. Mild bilateral ethmoid sinusitis. Soft tissues: Unremarkable. IMPRESSION: 1. A specific cause for the patient's facial pain and dizziness/vertigo is not identified. 2. Small bilateral mastoid effusions, right greater than left. Mild bilateral ethmoid sinusitis. 3. There is potentially a small remote left medial orbital wall fracture. Electronically Signed   By: Van Clines M.D.   On: 07/05/2017 12:28   Ct Maxillofacial Wo Contrast  Result Date: 07/05/2017 CLINICAL DATA:  Vertigo.  Dizziness and facial pain. EXAM: CT HEAD WITHOUT CONTRAST CT MAXILLOFACIAL WITHOUT CONTRAST TECHNIQUE: Multidetector CT imaging of the head and maxillofacial structures were performed using the standard protocol without intravenous contrast. Multiplanar CT image reconstructions of the maxillofacial structures were also generated. COMPARISON:  None. FINDINGS: CT HEAD FINDINGS Brain:  The brainstem, cerebellum, cerebral peduncles, thalami, basal ganglia, basilar cisterns, and ventricular system appear within normal limits. No intracranial hemorrhage, mass lesion, or acute CVA. Vascular: Unremarkable Skull: Unremarkable. No cerebellopontine angle mass or obvious asymmetry of the internal auditory canals. Other: No supplemental non-categorized findings. CT MAXILLOFACIAL FINDINGS Osseous: I suspect a small remote left medial orbital wall fracture, images 29-30 series 7. Not thought to be acute. Orbits: Unremarkable Sinuses: Small bilateral mastoid  effusions, right greater than left. No middle ear fluid. Mild bilateral ethmoid sinusitis. Soft tissues: Unremarkable. IMPRESSION: 1. A specific cause for the patient's facial pain and dizziness/vertigo is not identified. 2. Small bilateral mastoid effusions, right greater than left. Mild bilateral ethmoid sinusitis. 3. There is potentially a small remote left medial orbital wall fracture. Electronically Signed   By: Van Clines M.D.   On: 07/05/2017 12:28    Procedures Procedures (including critical care time)  Medications Ordered in ED Medications  diphenhydrAMINE (BENADRYL) injection 25 mg (25 mg Intravenous Given 07/05/17 1141)  ondansetron (ZOFRAN) injection 4 mg (4 mg Intravenous Given 07/05/17 1142)  meclizine (ANTIVERT) tablet 25 mg (25 mg Oral Given 07/05/17 1142)     Initial Impression / Assessment and Plan / ED Course  I have reviewed the triage vital signs and the nursing notes.  Pertinent labs & imaging results that were available during my care of the patient were reviewed by me and considered in my medical decision making (see chart for details).   CT of head and maxillofacial obtained. Primarily because of patient's immunosuppression make sure there was not marked sinus involvement that would suggest additional pathology. He showed no acute findings. He was given IV Benadryl, and Zofran. Given by mouth meclizine. His symptoms have improved he is able to stand and ambulate. Plan is home, Flonase, meclizine, when necessary Zofran, when necessary nightly Valium. Driving precautions, vertigo precautions.  Final Clinical Impressions(s) / ED Diagnoses   Final diagnoses:  Peripheral vertigo of both ears    New Prescriptions New Prescriptions   DIAZEPAM (VALIUM) 5 MG TABLET    Take 1 tablet (5 mg total) by mouth every 12 (twelve) hours as needed (vertigo).   FLUTICASONE (FLONASE) 50 MCG/ACT NASAL SPRAY    1 spray each nares bid   MECLIZINE (ANTIVERT) 25 MG TABLET    Take 1  tablet (25 mg total) by mouth 3 (three) times daily as needed.   ONDANSETRON (ZOFRAN ODT) 4 MG DISINTEGRATING TABLET    Take 1 tablet (4 mg total) by mouth every 8 (eight) hours as needed for nausea.     Tanna Furry, MD 07/05/17 1309

## 2017-07-05 NOTE — Discharge Instructions (Signed)
Take meclizine until 24 hours without vertigo.  No driving until 24 hours without vertigo.  Valium as needed for severe symptoms, or symptoms at night.

## 2017-07-11 ENCOUNTER — Ambulatory Visit (INDEPENDENT_AMBULATORY_CARE_PROVIDER_SITE_OTHER): Payer: BLUE CROSS/BLUE SHIELD | Admitting: Primary Care

## 2017-07-11 ENCOUNTER — Encounter: Payer: Self-pay | Admitting: Primary Care

## 2017-07-11 VITALS — BP 122/80 | HR 66 | Temp 98.4°F | Ht 72.0 in | Wt 232.0 lb

## 2017-07-11 DIAGNOSIS — R42 Dizziness and giddiness: Secondary | ICD-10-CM | POA: Insufficient documentation

## 2017-07-11 MED ORDER — ONDANSETRON 4 MG PO TBDP: 4.0000 mg | ORAL_TABLET | Freq: Three times a day (TID) | ORAL | 0 refills | Status: DC | PRN

## 2017-07-11 NOTE — Patient Instructions (Signed)
Reduce Meclizine to 1/2 tablet up to three times daily as needed for dizziness/vertigo.   You may take ondansetron every 8 hours as needed for nausea. I sent a refill to your pharmacy.   Perform the Epley's maneuver's as discussed.  Please notify me Friday this week if no improvement in your symptoms.  It was a pleasure to see you today!  How to Perform the Epley Maneuver The Epley maneuver is an exercise that relieves symptoms of vertigo. Vertigo is the feeling that you or your surroundings are moving when they are not. When you feel vertigo, you may feel like the room is spinning and have trouble walking. Dizziness is a little different than vertigo. When you are dizzy, you may feel unsteady or light-headed. You can do this maneuver at home whenever you have symptoms of vertigo. You can do it up to 3 times a day until your symptoms go away. Even though the Epley maneuver may relieve your vertigo for a few weeks, it is possible that your symptoms will return. This maneuver relieves vertigo, but it does not relieve dizziness. What are the risks? If it is done correctly, the Epley maneuver is considered safe. Sometimes it can lead to dizziness or nausea that goes away after a short time. If you develop other symptoms, such as changes in vision, weakness, or numbness, stop doing the maneuver and call your health care provider. How to perform the Epley maneuver 1. Sit on the edge of a bed or table with your back straight and your legs extended or hanging over the edge of the bed or table. 2. Turn your head halfway toward the affected ear or side. 3. Lie backward quickly with your head turned until you are lying flat on your back. You may want to position a pillow under your shoulders. 4. Hold this position for 30 seconds. You may experience an attack of vertigo. This is normal. 5. Turn your head to the opposite direction until your unaffected ear is facing the floor. 6. Hold this position for 30  seconds. You may experience an attack of vertigo. This is normal. Hold this position until the vertigo stops. 7. Turn your whole body to the same side as your head. Hold for another 30 seconds. 8. Sit back up. You can repeat this exercise up to 3 times a day. Follow these instructions at home:  After doing the Epley maneuver, you can return to your normal activities.  Ask your health care provider if there is anything you should do at home to prevent vertigo. He or she may recommend that you: ? Keep your head raised (elevated) with two or more pillows while you sleep. ? Do not sleep on the side of your affected ear. ? Get up slowly from bed. ? Avoid sudden movements during the day. ? Avoid extreme head movement, like looking up or bending over. Contact a health care provider if:  Your vertigo gets worse.  You have other symptoms, including: ? Nausea. ? Vomiting. ? Headache. Get help right away if:  You have vision changes.  You have a severe or worsening headache or neck pain.  You cannot stop vomiting.  You have new numbness or weakness in any part of your body. Summary  Vertigo is the feeling that you or your surroundings are moving when they are not.  The Epley maneuver is an exercise that relieves symptoms of vertigo.  If the Epley maneuver is done correctly, it is considered safe. You can  do it up to 3 times a day. This information is not intended to replace advice given to you by your health care provider. Make sure you discuss any questions you have with your health care provider. Document Released: 10/16/2013 Document Revised: 08/31/2016 Document Reviewed: 08/31/2016 Elsevier Interactive Patient Education  2017 [employer].

## 2017-07-11 NOTE — Progress Notes (Signed)
Subjective:    Patient ID: Justin Baldwin, male    DOB: 1976-04-09, 41 y.o.   MRN: [id number]  HPI  Justin Baldwin is a 41 year old male who presents today for emergency department follow up.   He presented to [facility] emergency department on 07/05/17 with a chief complaint of dizziness. He developed sudden dizziness after "sniffling hard". He also felt the sudden rushing sensation to his face and ears. He describes his dizziness as though the room was spinning.  During his stay in the emergency department he underwent evaluation including CT head and maxillofacial. Both imaging scans were unremarkable for acute findings. He was treated with IV Benadryl and Zofran, by mouth meclizine. His symptoms improved and was able to ambulate. He was discharged home later that afternoon with prescriptions for oral Zofran, meclizine, Valium, Flonase.  Since his ED visit his last week his symptoms have improved. The spinning sensation has resolved, but cannot walk in a straight line. He feels off balanced, some visual disturbances, ears feel "clogged", and has disorientation with loud sounds. He's been using the Zofran, Meclizine, and Valium. The meclizine has helped with his dizziness that causes drowsiness. He denies chest pain, cough, fevers.  Review of Systems  Constitutional: Negative for fever.  HENT: Positive for congestion. Negative for ear pain and sore throat.        Bilateral ear fullness  Eyes: Positive for visual disturbance.  Respiratory: Negative for cough and shortness of breath.   Cardiovascular: Negative for chest pain.  Allergic/Immunologic: Positive for environmental allergies.  Neurological: Positive for dizziness.       Past Medical History:  Diagnosis Date  . Erectile dysfunction   . Insomnia   . Skin abnormality    hidradenitis suppurativa      Social History   Social History  . Marital status: Married    Spouse name: N/A  . Number of children: N/A  . Years of education:  N/A   Occupational History  . Not on file.   Social History Main Topics  . Smoking status: Current Every Day Smoker    Types: Cigarettes  . Smokeless tobacco: Former Systems developer    Quit date: 02/14/2016  . Alcohol use 0.0 oz/week     Comment: occasinal   . Drug use: No  . Sexual activity: Not on file   Other Topics Concern  . Not on file   Social History Narrative   Married.   2 children.   Works as a Nutritional therapist   Enjoys spending time with family.     No past surgical history on file.  Family History  Problem Relation Age of Onset  . Heart attack Father   . Emphysema Paternal Grandmother   . Arthritis Mother     No Known Allergies  Current Outpatient Prescriptions on File Prior to Visit  Medication Sig Dispense Refill  . Adalimumab (HUMIRA) 40 MG/0.8ML PSKT Inject 40 mg into the skin.    Marland Kitchen CLARAVIS 40 MG capsule Take 1 capsule every other day  0  . diazepam (VALIUM) 5 MG tablet Take 1 tablet (5 mg total) by mouth every 12 (twelve) hours as needed (vertigo). 10 tablet 0  . fluticasone (FLONASE) 50 MCG/ACT nasal spray 1 spray each nares bid 10 g 1  . meclizine (ANTIVERT) 25 MG tablet Take 1 tablet (25 mg total) by mouth 3 (three) times daily as needed. 20 tablet 0   No current facility-administered medications on file prior to visit.  BP 122/80   Pulse 66   Temp 98.4 F (36.9 C) (Oral)   Ht 6' (1.829 m)   Wt 232 lb (105.2 kg)   SpO2 98%   BMI 31.46 kg/m    Objective:   Physical Exam  Constitutional: He is oriented to person, place, and time. He appears well-nourished.  HENT:  Right Ear: Tympanic membrane and ear canal normal.  Left Ear: Tympanic membrane and ear canal normal.  Nose: No mucosal edema. Right sinus exhibits no maxillary sinus tenderness and no frontal sinus tenderness. Left sinus exhibits no maxillary sinus tenderness and no frontal sinus tenderness.  Mouth/Throat: Oropharynx is clear and moist.  Eyes: Pupils are equal, round, and reactive to  light. Conjunctivae and EOM are normal.  Neck: Neck supple.  Cardiovascular: Normal rate and regular rhythm.   Pulmonary/Chest: Effort normal and breath sounds normal. He has no wheezes. He has no rales.  Neurological: He is alert and oriented to person, place, and time. No cranial nerve deficit. Coordination normal.  No nystagmus noticed  Skin: Skin is warm and dry.          Assessment & Plan:

## 2017-07-11 NOTE — Assessment & Plan Note (Signed)
Evaluated at [facility] emergency department, diagnosed with vertigo. Overall doing better since treatment, still has some symptoms. Exam today unremarkable. Patient ambulating in clinic without difficulty, also looking down at phone texting while walking without difficulty. Continue meclizine, cut tablets in half given drowsiness. Refill for Zofran sent to pharmacy. Handout for you Epley's maneuvers provided. If no improvement by the end of this week, will send to ENT for further evaluation.  All Hospital imaging and notes reviewed.

## 2017-07-14 DIAGNOSIS — F4325 Adjustment disorder with mixed disturbance of emotions and conduct: Secondary | ICD-10-CM | POA: Diagnosis not present

## 2017-07-18 DIAGNOSIS — F4322 Adjustment disorder with anxiety: Secondary | ICD-10-CM | POA: Diagnosis not present

## 2017-07-21 DIAGNOSIS — H8111 Benign paroxysmal vertigo, right ear: Secondary | ICD-10-CM | POA: Diagnosis not present

## 2017-07-21 DIAGNOSIS — Z0110 Encounter for examination of ears and hearing without abnormal findings: Secondary | ICD-10-CM | POA: Diagnosis not present

## 2017-07-21 DIAGNOSIS — J358 Other chronic diseases of tonsils and adenoids: Secondary | ICD-10-CM | POA: Diagnosis not present

## 2017-07-26 DIAGNOSIS — L732 Hidradenitis suppurativa: Secondary | ICD-10-CM | POA: Diagnosis not present

## 2017-07-26 DIAGNOSIS — L700 Acne vulgaris: Secondary | ICD-10-CM | POA: Diagnosis not present

## 2017-07-26 DIAGNOSIS — F4325 Adjustment disorder with mixed disturbance of emotions and conduct: Secondary | ICD-10-CM | POA: Diagnosis not present

## 2017-08-02 DIAGNOSIS — R42 Dizziness and giddiness: Secondary | ICD-10-CM | POA: Diagnosis not present

## 2017-08-02 DIAGNOSIS — J358 Other chronic diseases of tonsils and adenoids: Secondary | ICD-10-CM | POA: Diagnosis not present

## 2017-08-02 DIAGNOSIS — H8111 Benign paroxysmal vertigo, right ear: Secondary | ICD-10-CM | POA: Diagnosis not present

## 2017-08-08 DIAGNOSIS — F4322 Adjustment disorder with anxiety: Secondary | ICD-10-CM | POA: Diagnosis not present

## 2017-08-09 DIAGNOSIS — F4325 Adjustment disorder with mixed disturbance of emotions and conduct: Secondary | ICD-10-CM | POA: Diagnosis not present

## 2017-08-23 DIAGNOSIS — F4322 Adjustment disorder with anxiety: Secondary | ICD-10-CM | POA: Diagnosis not present

## 2017-08-23 DIAGNOSIS — F4325 Adjustment disorder with mixed disturbance of emotions and conduct: Secondary | ICD-10-CM | POA: Diagnosis not present

## 2017-09-05 DIAGNOSIS — F4322 Adjustment disorder with anxiety: Secondary | ICD-10-CM | POA: Diagnosis not present

## 2017-09-06 DIAGNOSIS — F4325 Adjustment disorder with mixed disturbance of emotions and conduct: Secondary | ICD-10-CM | POA: Diagnosis not present

## 2017-09-12 DIAGNOSIS — F4325 Adjustment disorder with mixed disturbance of emotions and conduct: Secondary | ICD-10-CM | POA: Diagnosis not present

## 2017-09-19 DIAGNOSIS — F4322 Adjustment disorder with anxiety: Secondary | ICD-10-CM | POA: Diagnosis not present

## 2017-09-22 DIAGNOSIS — F4325 Adjustment disorder with mixed disturbance of emotions and conduct: Secondary | ICD-10-CM | POA: Diagnosis not present

## 2017-10-04 DIAGNOSIS — F4325 Adjustment disorder with mixed disturbance of emotions and conduct: Secondary | ICD-10-CM | POA: Diagnosis not present

## 2017-10-10 DIAGNOSIS — F4322 Adjustment disorder with anxiety: Secondary | ICD-10-CM | POA: Diagnosis not present

## 2017-10-13 DIAGNOSIS — F4325 Adjustment disorder with mixed disturbance of emotions and conduct: Secondary | ICD-10-CM | POA: Diagnosis not present

## 2017-10-26 DIAGNOSIS — L400 Psoriasis vulgaris: Secondary | ICD-10-CM | POA: Diagnosis not present

## 2017-11-03 DIAGNOSIS — F4325 Adjustment disorder with mixed disturbance of emotions and conduct: Secondary | ICD-10-CM | POA: Diagnosis not present

## 2017-11-07 DIAGNOSIS — F4322 Adjustment disorder with anxiety: Secondary | ICD-10-CM | POA: Diagnosis not present

## 2017-11-08 ENCOUNTER — Encounter: Payer: Self-pay | Admitting: Primary Care

## 2017-11-08 ENCOUNTER — Ambulatory Visit: Payer: BLUE CROSS/BLUE SHIELD | Admitting: Primary Care

## 2017-11-08 VITALS — BP 116/66 | HR 82 | Temp 98.1°F | Ht 72.0 in | Wt 232.0 lb

## 2017-11-08 DIAGNOSIS — R5383 Other fatigue: Secondary | ICD-10-CM | POA: Diagnosis not present

## 2017-11-08 DIAGNOSIS — J309 Allergic rhinitis, unspecified: Secondary | ICD-10-CM

## 2017-11-08 LAB — COMPREHENSIVE METABOLIC PANEL
ALK PHOS: 60 U/L (ref 39–117)
ALT: 11 U/L (ref 0–53)
AST: 14 U/L (ref 0–37)
Albumin: 4.3 g/dL (ref 3.5–5.2)
BILIRUBIN TOTAL: 0.7 mg/dL (ref 0.2–1.2)
BUN: 15 mg/dL (ref 6–23)
CO2: 27 meq/L (ref 19–32)
CREATININE: 1 mg/dL (ref 0.40–1.50)
Calcium: 9.2 mg/dL (ref 8.4–10.5)
Chloride: 103 mEq/L (ref 96–112)
GFR: 87.37 mL/min (ref >60.00)
Glucose, Bld: 107 mg/dL — ABNORMAL HIGH (ref 70–99)
Potassium: 4 mEq/L (ref 3.5–5.1)
Sodium: 136 mEq/L (ref 135–145)
TOTAL PROTEIN: 7.5 g/dL (ref 6.0–8.3)

## 2017-11-08 LAB — CBC
HCT: 48.1 % (ref 39.0–52.0)
HEMOGLOBIN: 16.4 g/dL (ref 13.0–17.0)
MCHC: 34.1 g/dL (ref 30.0–36.0)
MCV: 96.2 fl (ref 78.0–100.0)
PLATELETS: 195 10*3/uL (ref 150.0–400.0)
RBC: 5 Mil/uL (ref 4.22–5.81)
RDW: 13.2 % (ref 11.5–15.5)
WBC: 8.1 10*3/uL (ref 4.0–10.5)

## 2017-11-08 LAB — TSH: TSH: 1.07 u[IU]/mL (ref 0.35–4.50)

## 2017-11-08 NOTE — Assessment & Plan Note (Signed)
Present for the past 2 years, progressing.  Recommended to ensure good sleep hygiene, regular exercise, healthy diet.  Check TSH, CBC, CMP, testosterone levels today. Will send to Urology for further evaluation if testosterone levels are low.

## 2017-11-08 NOTE — Patient Instructions (Signed)
You may take an antihistamine such as Claritin, Allegra, Zyrtec for sneezing, runny nose, itchy eyes.  Avoid spicy/acidic/irritative foods as they can cause heartburn.  You will be contacted regarding your referral to Allergy testing.  Please let us know if you have not been contacted within one week.   Stop by the lab prior to leaving today. I will notify you of your results once received.   It was a pleasure to see you today!   Food Choices for Gastroesophageal Reflux Disease, Adult When you have gastroesophageal reflux disease (GERD), the foods you eat and your eating habits are very important. Choosing the right foods can help ease your discomfort. What guidelines do I need to follow?  Choose fruits, vegetables, whole grains, and low-fat dairy products.  Choose low-fat meat, fish, and poultry.  Limit fats such as oils, salad dressings, butter, nuts, and avocado.  Keep a food diary. This helps you identify foods that cause symptoms.  Avoid foods that cause symptoms. These may be different for everyone.  Eat small meals often instead of 3 large meals a day.  Eat your meals slowly, in a place where you are relaxed.  Limit fried foods.  Cook foods using methods other than frying.  Avoid drinking alcohol.  Avoid drinking large amounts of liquids with your meals.  Avoid bending over or lying down until 2-3 hours after eating. What foods are not recommended? These are some foods and drinks that may make your symptoms worse: Vegetables Tomatoes. Tomato juice. Tomato and spaghetti sauce. Chili peppers. Onion and garlic. Horseradish. Fruits Oranges, grapefruit, and lemon (fruit and juice). Meats High-fat meats, fish, and poultry. This includes hot dogs, ribs, ham, sausage, salami, and bacon. Dairy Whole milk and chocolate milk. Sour cream. Cream. Butter. Ice cream. Cream cheese. Drinks Coffee and tea. Bubbly (carbonated) drinks or energy drinks. Condiments Hot sauce.  Barbecue sauce. Sweets/Desserts Chocolate and cocoa. Donuts. Peppermint and spearmint. Fats and Oils High-fat foods. This includes [place] fries and potato chips. Other Vinegar. Strong spices. This includes black pepper, white pepper, red pepper, cayenne, curry powder, cloves, ginger, and chili powder. The items listed above may not be a complete list of foods and drinks to avoid. Contact your dietitian for more information. This information is not intended to replace advice given to you by your health care provider. Make sure you discuss any questions you have with your health care provider. Document Released: 04/11/2012 Document Revised: 03/18/2016 Document Reviewed: 08/15/2013 Elsevier Interactive Patient Education  2017 [employer].

## 2017-11-08 NOTE — Progress Notes (Signed)
Subjective:    Patient ID: Justin Baldwin, male    DOB: 1975/12/08, 42 y.o.   MRN: [id number]  HPI  Justin Baldwin is a 42 year old male who presents today for lab testing.  He would like his testosterone and other hormones checked. He's noticed fatigue, low sex-drive which has been problematic for years and believes it's getting worse. He denies personal and family history of testosterone deficiency, prostate cancer.  He's sleeping 7-8 hours of sleep every night, will sometimes wake during the night to use the bathroom but will fall back asleep. He denies increased stress. He denies anxiety and depression, snoring at night.   He would also like allergy testing for food and environmental allergies. He will experience hot flashes and heartburn after eating certain foods, difficulty swallowing certain foods. They recently adopted cats and has noticed more sneezing and watery eyes. He's requesting to see an allergist for full allergy testing.   Review of Systems  Constitutional: Positive for fatigue. Negative for fever.  HENT: Positive for sneezing and trouble swallowing.   Eyes: Positive for itching.  Respiratory: Negative for shortness of breath.        Denies snoring  Cardiovascular: Negative for chest pain.  Gastrointestinal:       Esophageal burning intermittently.        Past Medical History:  Diagnosis Date  . Erectile dysfunction   . Insomnia   . Skin abnormality    hidradenitis suppurativa      Social History   Socioeconomic History  . Marital status: Married    Spouse name: Not on file  . Number of children: Not on file  . Years of education: Not on file  . Highest education level: Not on file  Social Needs  . Financial resource strain: Not on file  . Food insecurity - worry: Not on file  . Food insecurity - inability: Not on file  . Transportation needs - medical: Not on file  . Transportation needs - non-medical: Not on file  Occupational History  . Not on file    Tobacco Use  . Smoking status: Current Every Day Smoker    Types: Cigarettes  . Smokeless tobacco: Former Systems developer    Quit date: 02/14/2016  Substance and Sexual Activity  . Alcohol use: Yes    Alcohol/week: 0.0 oz    Comment: occasinal   . Drug use: No  . Sexual activity: Not on file  Other Topics Concern  . Not on file  Social History Narrative   Married.   2 children.   Works as a Nutritional therapist   Enjoys spending time with family.     No past surgical history on file.  Family History  Problem Relation Age of Onset  . Heart attack Father   . Emphysema Paternal Grandmother   . Arthritis Mother     No Known Allergies  Current Outpatient Medications on File Prior to Visit  Medication Sig Dispense Refill  . Adalimumab (HUMIRA) 40 MG/0.8ML PSKT Inject 40 mg into the skin.     No current facility-administered medications on file prior to visit.     BP 116/66   Pulse 82   Temp 98.1 F (36.7 C) (Oral)   Ht 6' (1.829 m)   Wt 232 lb (105.2 kg)   SpO2 97%   BMI 31.46 kg/m    Objective:   Physical Exam  Constitutional: He appears well-nourished.  HENT:  Right Ear: Tympanic membrane and ear canal normal.  Left Ear: Tympanic membrane and ear canal normal.  Nose: No mucosal edema. Right sinus exhibits no maxillary sinus tenderness and no frontal sinus tenderness. Left sinus exhibits no maxillary sinus tenderness and no frontal sinus tenderness.  Mouth/Throat: Oropharynx is clear and moist.  Eyes: Conjunctivae are normal.  Neck: Neck supple.  Cardiovascular: Normal rate and regular rhythm.  Pulmonary/Chest: Effort normal and breath sounds normal. He has no wheezes. He has no rales.  Skin: Skin is warm and dry.          Assessment & Plan:  Allergy Testing:  Suspect he does have some sensitivity to cat dander, discussed antihistamine during episodes of symptoms.  Recommended Tums or Zantac during GERD symptoms. Avoid spicy/acidic/irritative foods.  Referral placed  for allergy testing.  Sheral Flow, NP

## 2017-11-10 DIAGNOSIS — F4323 Adjustment disorder with mixed anxiety and depressed mood: Secondary | ICD-10-CM | POA: Diagnosis not present

## 2017-11-13 LAB — TESTOS,TOTAL,FREE AND SHBG (FEMALE)
Free Testosterone: 76.1 pg/mL (ref 35.0–155.0)
SEX HORMONE BINDING: 24 nmol/L (ref 10–50)
Testosterone, Total, LC-MS-MS: 488 ng/dL (ref 250–1100)

## 2017-11-14 ENCOUNTER — Telehealth: Payer: Self-pay | Admitting: Primary Care

## 2017-11-14 ENCOUNTER — Telehealth: Payer: Self-pay

## 2017-11-14 DIAGNOSIS — F4322 Adjustment disorder with anxiety: Secondary | ICD-10-CM | POA: Diagnosis not present

## 2017-11-14 NOTE — Telephone Encounter (Signed)
Copied from [place]. Topic: Referral - Status >> Nov 14, 2017  3:20 PM Clack, Laban Emperor wrote: Reason for CRM: Pt would like to know the status of his referral, please f/u with pt.   Didn't see any update information to give to pt other then it was put in.

## 2017-11-14 NOTE — Telephone Encounter (Signed)
Pt called to get testosterone levels. Pt notified as instructed per 11/13/17 result note from Gentry Fitz NP. Pt voiced understanding.

## 2017-11-14 NOTE — Telephone Encounter (Signed)
Copied from [place]. Topic: Quick Communication - See Telephone Encounter >> Nov 14, 2017  3:18 PM Clack, Laban Emperor wrote: CRM for notification. See Telephone encounter for:  Pt calling for the 2nd half of his lab results.  11/14/17.

## 2017-11-15 DIAGNOSIS — F4322 Adjustment disorder with anxiety: Secondary | ICD-10-CM | POA: Diagnosis not present

## 2017-11-15 NOTE — Telephone Encounter (Signed)
Appt made by [facility] and patient contacted.

## 2017-11-21 DIAGNOSIS — F4323 Adjustment disorder with mixed anxiety and depressed mood: Secondary | ICD-10-CM | POA: Diagnosis not present

## 2017-11-24 DIAGNOSIS — F4323 Adjustment disorder with mixed anxiety and depressed mood: Secondary | ICD-10-CM | POA: Diagnosis not present

## 2017-11-28 DIAGNOSIS — F4322 Adjustment disorder with anxiety: Secondary | ICD-10-CM | POA: Diagnosis not present

## 2017-12-05 DIAGNOSIS — F418 Other specified anxiety disorders: Secondary | ICD-10-CM | POA: Diagnosis not present

## 2017-12-06 DIAGNOSIS — L732 Hidradenitis suppurativa: Secondary | ICD-10-CM | POA: Diagnosis not present

## 2017-12-06 DIAGNOSIS — L723 Sebaceous cyst: Secondary | ICD-10-CM | POA: Diagnosis not present

## 2017-12-06 DIAGNOSIS — L309 Dermatitis, unspecified: Secondary | ICD-10-CM | POA: Diagnosis not present

## 2017-12-06 DIAGNOSIS — L089 Local infection of the skin and subcutaneous tissue, unspecified: Secondary | ICD-10-CM | POA: Diagnosis not present

## 2017-12-12 DIAGNOSIS — F4322 Adjustment disorder with anxiety: Secondary | ICD-10-CM | POA: Diagnosis not present

## 2017-12-12 DIAGNOSIS — F418 Other specified anxiety disorders: Secondary | ICD-10-CM | POA: Diagnosis not present

## 2017-12-19 DIAGNOSIS — F418 Other specified anxiety disorders: Secondary | ICD-10-CM | POA: Diagnosis not present

## 2017-12-26 DIAGNOSIS — F418 Other specified anxiety disorders: Secondary | ICD-10-CM | POA: Diagnosis not present

## 2018-01-02 DIAGNOSIS — F418 Other specified anxiety disorders: Secondary | ICD-10-CM | POA: Diagnosis not present

## 2018-01-16 DIAGNOSIS — F418 Other specified anxiety disorders: Secondary | ICD-10-CM | POA: Diagnosis not present

## 2018-01-30 DIAGNOSIS — F418 Other specified anxiety disorders: Secondary | ICD-10-CM | POA: Diagnosis not present

## 2018-02-14 DIAGNOSIS — F418 Other specified anxiety disorders: Secondary | ICD-10-CM | POA: Diagnosis not present

## 2018-02-28 DIAGNOSIS — F418 Other specified anxiety disorders: Secondary | ICD-10-CM | POA: Diagnosis not present

## 2018-03-13 DIAGNOSIS — F418 Other specified anxiety disorders: Secondary | ICD-10-CM | POA: Diagnosis not present

## 2018-04-06 DIAGNOSIS — F418 Other specified anxiety disorders: Secondary | ICD-10-CM | POA: Diagnosis not present

## 2018-04-18 DIAGNOSIS — F418 Other specified anxiety disorders: Secondary | ICD-10-CM | POA: Diagnosis not present

## 2018-05-01 DIAGNOSIS — F4323 Adjustment disorder with mixed anxiety and depressed mood: Secondary | ICD-10-CM | POA: Diagnosis not present

## 2018-05-04 DIAGNOSIS — F418 Other specified anxiety disorders: Secondary | ICD-10-CM | POA: Diagnosis not present

## 2018-05-08 DIAGNOSIS — R399 Unspecified symptoms and signs involving the genitourinary system: Secondary | ICD-10-CM | POA: Diagnosis not present

## 2018-05-08 DIAGNOSIS — N5201 Erectile dysfunction due to arterial insufficiency: Secondary | ICD-10-CM | POA: Diagnosis not present

## 2018-05-15 DIAGNOSIS — F4323 Adjustment disorder with mixed anxiety and depressed mood: Secondary | ICD-10-CM | POA: Diagnosis not present

## 2018-05-18 DIAGNOSIS — F418 Other specified anxiety disorders: Secondary | ICD-10-CM | POA: Diagnosis not present

## 2018-05-29 DIAGNOSIS — F418 Other specified anxiety disorders: Secondary | ICD-10-CM | POA: Diagnosis not present

## 2018-06-05 DIAGNOSIS — F4323 Adjustment disorder with mixed anxiety and depressed mood: Secondary | ICD-10-CM | POA: Diagnosis not present

## 2018-06-08 DIAGNOSIS — F418 Other specified anxiety disorders: Secondary | ICD-10-CM | POA: Diagnosis not present

## 2018-06-19 DIAGNOSIS — F4323 Adjustment disorder with mixed anxiety and depressed mood: Secondary | ICD-10-CM | POA: Diagnosis not present

## 2018-06-22 DIAGNOSIS — F418 Other specified anxiety disorders: Secondary | ICD-10-CM | POA: Diagnosis not present

## 2018-07-03 DIAGNOSIS — F4323 Adjustment disorder with mixed anxiety and depressed mood: Secondary | ICD-10-CM | POA: Diagnosis not present

## 2018-07-06 DIAGNOSIS — F418 Other specified anxiety disorders: Secondary | ICD-10-CM | POA: Diagnosis not present

## 2018-07-20 DIAGNOSIS — F418 Other specified anxiety disorders: Secondary | ICD-10-CM | POA: Diagnosis not present

## 2018-07-25 ENCOUNTER — Encounter (INDEPENDENT_AMBULATORY_CARE_PROVIDER_SITE_OTHER): Payer: Self-pay

## 2018-07-25 ENCOUNTER — Telehealth: Payer: Self-pay | Admitting: Family Medicine

## 2018-07-25 ENCOUNTER — Encounter: Payer: Self-pay | Admitting: Family Medicine

## 2018-07-25 ENCOUNTER — Ambulatory Visit (INDEPENDENT_AMBULATORY_CARE_PROVIDER_SITE_OTHER): Payer: BLUE CROSS/BLUE SHIELD | Admitting: Family Medicine

## 2018-07-25 VITALS — BP 122/70 | HR 80 | Temp 98.3°F | Ht 72.0 in

## 2018-07-25 DIAGNOSIS — R21 Rash and other nonspecific skin eruption: Secondary | ICD-10-CM

## 2018-07-25 LAB — CBC WITH DIFFERENTIAL/PLATELET
BASOS ABS: 0 10*3/uL (ref 0.0–0.1)
Basophils Relative: 0.3 % (ref 0.0–3.0)
EOS ABS: 0 10*3/uL (ref 0.0–0.7)
Eosinophils Relative: 0.5 % (ref 0.0–5.0)
HEMATOCRIT: 52.1 % — AB (ref 39.0–52.0)
Lymphocytes Relative: 33.9 % (ref 12.0–46.0)
Lymphs Abs: 2.8 10*3/uL (ref 0.7–4.0)
MCHC: 34.7 g/dL (ref 30.0–36.0)
MCV: 94.6 fl (ref 78.0–100.0)
Monocytes Absolute: 0.6 10*3/uL (ref 0.1–1.0)
Monocytes Relative: 7.5 % (ref 3.0–12.0)
Neutro Abs: 4.8 10*3/uL (ref 1.4–7.7)
Neutrophils Relative %: 57.8 % (ref 43.0–77.0)
Platelets: 197 10*3/uL (ref 150.0–400.0)
RBC: 5.51 Mil/uL (ref 4.22–5.81)
RDW: 13.8 % (ref 11.5–15.5)
WBC: 8.4 10*3/uL (ref 4.0–10.5)

## 2018-07-25 LAB — COMPREHENSIVE METABOLIC PANEL
ALBUMIN: 4.3 g/dL (ref 3.5–5.2)
ALT: 20 U/L (ref 0–53)
AST: 22 U/L (ref 0–37)
Alkaline Phosphatase: 45 U/L (ref 39–117)
BILIRUBIN TOTAL: 0.7 mg/dL (ref 0.2–1.2)
BUN: 13 mg/dL (ref 6–23)
CALCIUM: 9.5 mg/dL (ref 8.4–10.5)
CO2: 30 mEq/L (ref 19–32)
CREATININE: 1.41 mg/dL (ref 0.40–1.50)
Chloride: 99 mEq/L (ref 96–112)
GFR: 58.57 mL/min — ABNORMAL LOW (ref >60.00)
Glucose, Bld: 96 mg/dL (ref 70–99)
Potassium: 4.4 mEq/L (ref 3.5–5.1)
Sodium: 136 mEq/L (ref 135–145)
Total Protein: 7.6 g/dL (ref 6.0–8.3)

## 2018-07-25 LAB — SEDIMENTATION RATE: SED RATE: 3 mm/h (ref 0–15)

## 2018-07-25 MED ORDER — PERMETHRIN 5 % EX CREA: 1.0000 "application " | TOPICAL_CREAM | Freq: Once | CUTANEOUS | 0 refills | Status: AC

## 2018-07-25 MED ORDER — DOXYCYCLINE HYCLATE 100 MG PO TABS: 100.0000 mg | ORAL_TABLET | Freq: Two times a day (BID) | ORAL | 0 refills | Status: DC

## 2018-07-25 NOTE — Progress Notes (Signed)
BP 122/70 (BP Location: Left Arm, Patient Position: Sitting, Cuff Size: Large)   Pulse 80   Temp 98.3 F (36.8 C) (Oral)   Ht 6' (1.829 m)   SpO2 96%   BMI 31.46 kg/m    CC: HA, rash Subjective:    Patient ID: Justin Baldwin, male    DOB: 1976/02/03, 42 y.o.   MRN: [id number]  HPI: Justin Baldwin is a 42 y.o. male presenting on 07/25/2018 for Headache (C/o HA that started this morning. ) and Rash (Noticed rash all over this morning and was itchy this morning. )   Woke up this morning with HA and itchy rash - noted spots on extremities - arms and legs - as well as on trunk. HA described as "regular headache" pressure pain behind R eye treated with motrin 483m. No vision changes, no phono/photophobia. This doesn't feel like migraine (h/o childhood migraines). Started wearing new pajama pants to bed 2 days ago (he did wash before he used this - doesn't think related) - noticing some burning sensation to left lower leg as well as mild swelling. Some dizziness over last few days. Some congestion - may be seasonal. Episode of abd discomfort last week that lasted 1 days, with nausea and diarrhea.   No new lotions, detergents, soaps or shampoos.  No new medicines.  No new foods.   Denies recent tick bites or outdoor exposure. No mosquito bites.  No fevers/chills. No vomiting. No cough, dyspnea.   Hydradenitis suppurativa on humira managed by dermatologist - on this for last 2 years.   Relevant past medical, surgical, family and social history reviewed and updated as indicated. Interim medical history since our last visit reviewed. Allergies and medications reviewed and updated. Outpatient Medications Prior to Visit  Medication Sig Dispense Refill  . Adalimumab (HUMIRA) 40 MG/0.8ML PSKT Inject 40 mg into the skin.     No facility-administered medications prior to visit.      Per HPI unless specifically indicated in ROS section below Review of Systems     Objective:    BP 122/70 (BP  Location: Left Arm, Patient Position: Sitting, Cuff Size: Large)   Pulse 80   Temp 98.3 F (36.8 C) (Oral)   Ht 6' (1.829 m)   SpO2 96%   BMI 31.46 kg/m   Wt Readings from Last 3 Encounters:  11/08/17 232 lb (105.2 kg)  07/11/17 232 lb (105.2 kg)  07/05/17 231 lb (104.8 kg)    Physical Exam  Constitutional: He appears well-developed and well-nourished. No distress.  HENT:  Head: Normocephalic and atraumatic.  Mouth/Throat: Oropharynx is clear and moist. No oropharyngeal exudate.  Eyes: Pupils are equal, round, and reactive to light. Conjunctivae and EOM are normal.  Neck: Normal range of motion. Neck supple. No thyromegaly present.  Cardiovascular: Normal rate, regular rhythm and normal heart sounds.  No murmur heard. Pulmonary/Chest: Effort normal and breath sounds normal. No respiratory distress. He has no wheezes. He has no rales.  Abdominal: Soft. Bowel sounds are normal. He exhibits no distension and no mass. There is no hepatosplenomegaly. There is no tenderness. There is no rebound, no guarding and no CVA tenderness. No hernia.  Musculoskeletal: He exhibits edema (tr LLE edema).  Lymphadenopathy:    He has no cervical adenopathy.  Skin: Skin is warm and dry. Rash noted.  Diffuse papular mildly pruritic rash throughout trunk and extremities, spares neck face and scalp Erythema with warmth and mild swelling to left medial lower leg 2+  DP  Nursing note and vitals reviewed.  Results for orders placed or performed in visit on 11/08/17  Testos,Total,Free and SHBG (Male)  Result Value Ref Range   Testosterone, Total, LC-MS-MS 488 250 - 1,100 ng/dL   Free Testosterone 76.1 35.0 - 155.0 pg/mL   Sex Hormone Binding 24 10 - 50 nmol/L  CBC  Result Value Ref Range   WBC 8.1 4.0 - 10.5 K/uL   RBC 5.00 4.22 - 5.81 Mil/uL   Platelets 195.0 150.0 - 400.0 K/uL   Hemoglobin 16.4 13.0 - 17.0 g/dL   HCT 48.1 39.0 - 52.0 %   MCV 96.2 78.0 - 100.0 fl   MCHC 34.1 30.0 - 36.0 g/dL    RDW 13.2 11.5 - 15.5 %  TSH  Result Value Ref Range   TSH 1.07 0.35 - 4.50 uIU/mL  Comprehensive metabolic panel  Result Value Ref Range   Sodium 136 135 - 145 mEq/L   Potassium 4.0 3.5 - 5.1 mEq/L   Chloride 103 96 - 112 mEq/L   CO2 27 19 - 32 mEq/L   Glucose, Bld 107 (H) 70 - 99 mg/dL   BUN 15 6 - 23 mg/dL   Creatinine, Ser 1.00 0.40 - 1.50 mg/dL   Total Bilirubin 0.7 0.2 - 1.2 mg/dL   Alkaline Phosphatase 60 39 - 117 U/L   AST 14 0 - 37 U/L   ALT 11 0 - 53 U/L   Total Protein 7.5 6.0 - 8.3 g/dL   Albumin 4.3 3.5 - 5.2 g/dL   Calcium 9.2 8.4 - 10.5 mg/dL   GFR 87.37 >60.00 mL/min      Assessment & Plan:   Problem List Items Addressed This Visit    Skin rash - Primary    1d h/o HA and rash of unclear etiology. Does not give good story for tick bite or bed bugs or scabies.  Exam with possible scabies - will Rx permethrin cream x1.  With redness and warmth medial lower left leg - concern for developing cellulitis - treat with doxy 10d course - with photosensitivity precautions.  Check labs today for rash of unclear cause - CBC, CMP, ESR.       Relevant Orders   CBC with Differential/Platelet   Comprehensive metabolic panel   Sedimentation rate       Meds ordered this encounter  Medications  . doxycycline (VIBRA-TABS) 100 MG tablet    Sig: Take 1 tablet (100 mg total) by mouth 2 (two) times daily.    Dispense:  20 tablet    Refill:  0  . permethrin (ELIMITE) 5 % cream    Sig: Apply 1 application topically once for 1 dose.    Dispense:  60 g    Refill:  0   Orders Placed This Encounter  Procedures  . CBC with Differential/Platelet  . Comprehensive metabolic panel  . Sedimentation rate    Follow up plan: No follow-ups on file.  Ria Bush, MD

## 2018-07-25 NOTE — Patient Instructions (Signed)
Labs today.  Treat possible skin infection left leg with doxycycline antibiotic twice daily for 10 days.  Treat possible scabies with permethrin cream - apply overnight then wash off the next morning.

## 2018-07-25 NOTE — Assessment & Plan Note (Signed)
1d h/o HA and rash of unclear etiology. Does not give good story for tick bite or bed bugs or scabies.  Exam with possible scabies - will Rx permethrin cream x1.  With redness and warmth medial lower left leg - concern for developing cellulitis - treat with doxy 10d course - with photosensitivity precautions.  Check labs today for rash of unclear cause - CBC, CMP, ESR.

## 2018-07-25 NOTE — Telephone Encounter (Signed)
See result note.  

## 2018-07-25 NOTE — Telephone Encounter (Signed)
[facility] called with critical results. Hemoglobin 18.1 Results given verbally to Dr.Gutierrez.

## 2018-07-28 DIAGNOSIS — L309 Dermatitis, unspecified: Secondary | ICD-10-CM | POA: Diagnosis not present

## 2018-08-03 DIAGNOSIS — F418 Other specified anxiety disorders: Secondary | ICD-10-CM | POA: Diagnosis not present

## 2018-08-08 DIAGNOSIS — F418 Other specified anxiety disorders: Secondary | ICD-10-CM | POA: Diagnosis not present

## 2018-08-12 DIAGNOSIS — L0231 Cutaneous abscess of buttock: Secondary | ICD-10-CM | POA: Diagnosis not present

## 2018-08-22 DIAGNOSIS — F418 Other specified anxiety disorders: Secondary | ICD-10-CM | POA: Diagnosis not present

## 2018-08-25 ENCOUNTER — Ambulatory Visit: Payer: BLUE CROSS/BLUE SHIELD | Admitting: Internal Medicine

## 2018-08-25 ENCOUNTER — Encounter: Payer: Self-pay | Admitting: Internal Medicine

## 2018-08-25 VITALS — BP 128/80 | HR 91 | Temp 98.5°F | Ht 72.0 in | Wt 236.0 lb

## 2018-08-25 DIAGNOSIS — T148XXA Other injury of unspecified body region, initial encounter: Secondary | ICD-10-CM | POA: Diagnosis not present

## 2018-08-25 NOTE — Assessment & Plan Note (Signed)
Upper left buttock Finding fits with injury from swinging weight  Doesn't feel like lipoma or distinct tumor Not infection  Reassured  Should resolve over time

## 2018-08-25 NOTE — Progress Notes (Signed)
Subjective:    Patient ID: Justin Baldwin, male    DOB: [birth year], 42 y.o.   MRN: [id number]  HPI Here due to mass on buttock  Has hyperadenitis suppuritiva--on humira  Got antibiotic--for headache and rash Doxy for this Then to derm--- could be humira reaction and taken off Antihistamine and improved  Then lump on left buttock after this (about 3 weeks ago) [facility] to urgent care---got bactrim  Hasn't helped ---just ended recently (bothered stomach so only daily)  No clear change in the lump Some redness around it Tender -- a little Tried heat and cold---no help  Did swing a weight back and hit himself in that area--- before this began  Current Outpatient Medications on File Prior to Visit  Medication Sig Dispense Refill  . Adalimumab (HUMIRA) 40 MG/0.8ML PSKT Inject 40 mg into the skin.     No current facility-administered medications on file prior to visit.     No Known Allergies  Past Medical History:  Diagnosis Date  . Erectile dysfunction   . Insomnia   . Skin abnormality    hidradenitis suppurativa     History reviewed. No pertinent surgical history.  Family History  Problem Relation Age of Onset  . Heart attack Father   . Emphysema Paternal Grandmother   . Arthritis Mother     Social History   Socioeconomic History  . Marital status: Married    Spouse name: Not on file  . Number of children: Not on file  . Years of education: Not on file  . Highest education level: Not on file  Occupational History  . Not on file  Social Needs  . Financial resource strain: Not on file  . Food insecurity:    Worry: Not on file    Inability: Not on file  . Transportation needs:    Medical: Not on file    Non-medical: Not on file  Tobacco Use  . Smoking status: Current Every Day Smoker    Types: Cigarettes  . Smokeless tobacco: Former Systems developer    Quit date: 02/14/2016  Substance and Sexual Activity  . Alcohol use: Yes    Alcohol/week: 0.0 standard drinks   Comment: occasinal   . Drug use: No  . Sexual activity: Not on file  Lifestyle  . Physical activity:    Days per week: Not on file    Minutes per session: Not on file  . Stress: Not on file  Relationships  . Social connections:    Talks on phone: Not on file    Gets together: Not on file    Attends religious service: Not on file    Active member of club or organization: Not on file    Attends meetings of clubs or organizations: Not on file    Relationship status: Not on file  . Intimate partner violence:    Fear of current or ex partner: Not on file    Emotionally abused: Not on file    Physically abused: Not on file    Forced sexual activity: Not on file  Other Topics Concern  . Not on file  Social History Narrative   Married.   2 children.   Works as a Nutritional therapist   Enjoys spending time with family.    Review of Systems  No fever Doesn't feel sick     Objective:   Physical Exam  Constitutional: No distress.  Musculoskeletal:  Firm, slightly tender mass in upper left buttock No overlying contusion  Assessment & Plan:

## 2018-08-29 DIAGNOSIS — F418 Other specified anxiety disorders: Secondary | ICD-10-CM | POA: Diagnosis not present

## 2018-08-31 ENCOUNTER — Ambulatory Visit (HOSPITAL_COMMUNITY)
Admission: RE | Admit: 2018-08-31 | Discharge: 2018-08-31 | Disposition: A | Discharge status: Home / Self Care | Payer: BLUE CROSS/BLUE SHIELD | Source: Ambulatory Visit | Attending: Family Medicine | Admitting: Family Medicine

## 2018-08-31 ENCOUNTER — Ambulatory Visit: Payer: BLUE CROSS/BLUE SHIELD | Admitting: Family Medicine

## 2018-08-31 ENCOUNTER — Telehealth: Payer: Self-pay | Admitting: *Deleted

## 2018-08-31 ENCOUNTER — Encounter: Payer: Self-pay | Admitting: Family Medicine

## 2018-08-31 VITALS — BP 122/70 | HR 84 | Temp 99.2°F | Ht 72.0 in

## 2018-08-31 DIAGNOSIS — R1907 Generalized intra-abdominal and pelvic swelling, mass and lump: Secondary | ICD-10-CM | POA: Insufficient documentation

## 2018-08-31 DIAGNOSIS — N2889 Other specified disorders of kidney and ureter: Secondary | ICD-10-CM | POA: Diagnosis not present

## 2018-08-31 DIAGNOSIS — R229 Localized swelling, mass and lump, unspecified: Secondary | ICD-10-CM | POA: Diagnosis not present

## 2018-08-31 DIAGNOSIS — IMO0002 Reserved for concepts with insufficient information to code with codable children: Secondary | ICD-10-CM

## 2018-08-31 MED ORDER — IOHEXOL 300 MG/ML  SOLN
100.0000 mL | Freq: Once | INTRAMUSCULAR | Status: AC | PRN
  Administered 2018-08-31: 100 mL via INTRAVENOUS

## 2018-08-31 MED ORDER — SODIUM CHLORIDE (PF) 0.9 % IJ SOLN
INTRAMUSCULAR | Status: AC
  Filled 2018-08-31: qty 50

## 2018-08-31 MED ORDER — SULFAMETHOXAZOLE-TRIMETHOPRIM 800-160 MG PO TABS: 1.0000 | ORAL_TABLET | Freq: Two times a day (BID) | ORAL | 0 refills | Status: DC

## 2018-08-31 NOTE — Assessment & Plan Note (Addendum)
Large very deep mass/lump palpated in mid L buttock  Tender and warm  Firm / not mobile  Originally suspected deep tissue injury or hematoma from trauma  Now -running temp /area hurts more  Pt also has hx of hydradenitis (on Humira)  Worrisome for abscess  Disc with radiology- CT abd/pelvis with contrast ordered Ref to gen surg- has appt tomorrow inst to alert [place] and go to ED if symptoms worsen overnight  Bactrim Ds bid px

## 2018-08-31 NOTE — Patient Instructions (Addendum)
I want to refer you to general surgery   Use warm compresses for 10 minutes at a time  Take the bactrim DS as directed  Please alert [place] if no improvement in fever/discomfort or if worse

## 2018-08-31 NOTE — Telephone Encounter (Signed)
See result note please Thanks

## 2018-08-31 NOTE — Telephone Encounter (Signed)
[facility] contacted office to make Dr Glori Bickers aware CT imaging results are available in EMR; no report given. States pt has been sent home and is awaiting a call regarding results

## 2018-08-31 NOTE — Progress Notes (Signed)
Subjective:    Patient ID: Justin Baldwin, male    DOB: 1976/07/25, 42 y.o.   MRN: [id number]  HPI 42 yo pt of NP Clark with hydradenitis- (prev on humira) here with a lump on buttock  Also current smoker   Sees derm,  taken off humira (reaction)-now back on it  Had been on doxy prev for a foot problem  Bactrim from urgent care-- bothered stomach  Particular area on L buttock started after injury from a swinging weight  Saw Dr Silvio Pate on 11/1 - did not think it was infected  diagnosed with deep tissue injury   Tamp of 99.2 today  Feels like the area is more warm and more painful  Was 100.5 at home  Not draining (really deep)   Patient Active Problem List   Diagnosis Date Noted  . Lump 08/31/2018  . Deep tissue injury 08/25/2018  . Skin rash 07/25/2018  . Fatigue 11/08/2017  . Vertigo 07/11/2017  . Preventative health care 03/29/2016  . Insomnia 03/15/2016  . Erectile dysfunction 03/15/2016   Past Medical History:  Diagnosis Date  . Erectile dysfunction   . Insomnia   . Skin abnormality    hidradenitis suppurativa    History reviewed. No pertinent surgical history. Social History   Tobacco Use  . Smoking status: Current Every Day Smoker    Types: Cigarettes  . Smokeless tobacco: Former Systems developer    Quit date: 02/14/2016  Substance Use Topics  . Alcohol use: Yes    Alcohol/week: 0.0 standard drinks    Comment: occasinal   . Drug use: No   Family History  Problem Relation Age of Onset  . Heart attack Father   . Emphysema Paternal Grandmother   . Arthritis Mother    No Known Allergies Current Outpatient Medications on File Prior to Visit  Medication Sig Dispense Refill  . Adalimumab (HUMIRA) 40 MG/0.8ML PSKT Inject 40 mg into the skin.     No current facility-administered medications on file prior to visit.      Review of Systems     Objective:   Physical Exam  Constitutional: He appears well-developed and well-nourished. No distress.  Well appearing     HENT:  Head: Normocephalic.  Eyes: Pupils are equal, round, and reactive to light. Conjunctivae and EOM are normal. No scleral icterus.  Neck: Normal range of motion.  Cardiovascular: Normal rate, regular rhythm and normal heart sounds.  Pulmonary/Chest: Effort normal and breath sounds normal. No respiratory distress. He has no wheezes. He has no rales.  Abdominal: Bowel sounds are normal. He exhibits no distension and no mass. There is no tenderness. There is no guarding.  Musculoskeletal: He exhibits tenderness.  Large deep mass felt in L mid to upper buttock)  No erythema or skin change  No drainage or streaking No rash  Notable warmth  Some tenderness    Lymphadenopathy:    He has no cervical adenopathy.  Neurological: He is alert. No cranial nerve deficit. He exhibits normal muscle tone.  Skin: Skin is warm and dry. No rash noted. No erythema.  See MSK exam   Many tattoos   No active hydradenitis   Psychiatric: He has a normal mood and affect.          Assessment & Plan:   Problem List Items Addressed This Visit      Other   Lump - Primary    Large very deep mass/lump palpated in mid L buttock  Tender and warm  Firm / not mobile  Originally suspected deep tissue injury or hematoma from trauma  Now -running temp /area hurts more  Pt also has hx of hydradenitis (on Humira)  Worrisome for abscess  Disc with radiology- CT abd/pelvis with contrast ordered Ref to gen surg- has appt tomorrow inst to alert [place] and go to ED if symptoms worsen overnight  Bactrim Ds bid px         Relevant Orders   Ambulatory referral to General Surgery    Other Visit Diagnoses    Generalized intra-abdominal and pelvic swelling, mass and lump       Relevant Orders   CT Abdomen Pelvis W Contrast (Completed)

## 2018-09-01 DIAGNOSIS — R229 Localized swelling, mass and lump, unspecified: Secondary | ICD-10-CM | POA: Diagnosis not present

## 2018-09-06 DIAGNOSIS — F418 Other specified anxiety disorders: Secondary | ICD-10-CM | POA: Diagnosis not present

## 2018-09-07 DIAGNOSIS — F419 Anxiety disorder, unspecified: Secondary | ICD-10-CM | POA: Diagnosis not present

## 2018-09-12 ENCOUNTER — Other Ambulatory Visit (HOSPITAL_COMMUNITY): Payer: Self-pay | Admitting: Student

## 2018-09-12 DIAGNOSIS — L03317 Cellulitis of buttock: Secondary | ICD-10-CM

## 2018-09-12 DIAGNOSIS — F419 Anxiety disorder, unspecified: Secondary | ICD-10-CM | POA: Diagnosis not present

## 2018-09-13 ENCOUNTER — Ambulatory Visit (HOSPITAL_COMMUNITY)
Admission: RE | Admit: 2018-09-13 | Discharge: 2018-09-13 | Disposition: A | Discharge status: Home / Self Care | Payer: BLUE CROSS/BLUE SHIELD | Source: Ambulatory Visit | Attending: Student | Admitting: Student

## 2018-09-13 DIAGNOSIS — L03317 Cellulitis of buttock: Secondary | ICD-10-CM | POA: Diagnosis not present

## 2018-09-13 DIAGNOSIS — L0231 Cutaneous abscess of buttock: Secondary | ICD-10-CM | POA: Insufficient documentation

## 2018-09-13 MED ORDER — LIDOCAINE HCL (PF) 1 % IJ SOLN
INTRAMUSCULAR | Status: AC
  Filled 2018-09-13: qty 30

## 2018-09-13 NOTE — Procedures (Signed)
US guided aspiration of fluid in left buttock subcutaneous tissue.  25 ml of cloudy red fluid was removed.  Extensive subcutaneous edema in left buttock.  Minimal blood loss and no immediate complication.  Will send fluid for culture.

## 2018-09-15 DIAGNOSIS — F419 Anxiety disorder, unspecified: Secondary | ICD-10-CM | POA: Diagnosis not present

## 2018-09-18 DIAGNOSIS — F419 Anxiety disorder, unspecified: Secondary | ICD-10-CM | POA: Diagnosis not present

## 2018-09-19 ENCOUNTER — Ambulatory Visit (HOSPITAL_COMMUNITY): Payer: BLUE CROSS/BLUE SHIELD

## 2018-09-19 DIAGNOSIS — L03317 Cellulitis of buttock: Secondary | ICD-10-CM | POA: Diagnosis not present

## 2018-09-20 ENCOUNTER — Encounter: Payer: Self-pay | Admitting: Student

## 2018-09-20 NOTE — Progress Notes (Signed)
[name] Documented: 09/19/2018 3:22 PM Location: [facility] Patient #: [id number] DOB: Jun 24, 1976 Married / Language: English / Race: White Male   History of Present Illness The patient is a 42 year old male who presents with a complaint of an upper outer left buttock mass.  He initially presented to urgent office at [place] on 09/01/18, per request of his PCP for evaluation of a mass which appeared about 5 weeks prior, on his upper, outer left buttock. He has now been dealing with this for 8 weeks. About one month prior to a lump in this area, he was injecting Humira (for hidradenitis) in the surrounding tissue. A month later, he injured this area with a dumbbell. Three weeks prior to seeing me, he went to an urgent care to have this area evaluated and was prescribed Bactrim which he completed. He was also on Doxycycline at some point.  He followed up with his PCP on 08/31/18, who was concerned about his temperature of 99.2, with possibly more warmth and pain in the area. She put him back on Bactrim and ordered a CT scan which showed:  Other: Within the soft tissues of the left buttocks there is soft tissue strandiness consistent with cellulitis. Also there are low-attenuation fluid collections just superficial to the musculature of the gluteus maximus muscle consistent with small abscesses. There may be some involvement of the superficial aspect of the lateral left gluteus maximus musculature. No deeper extension of these probable abscesses is seen. Similar but smaller areas are noted within the right buttocks as well. These could be due to prior injections  IMPRESSION: 1. Probable superficial soft tissue abscesses within the buttocks left much greater than right with adjacent cellulitis left-greater-than-right. These apparent abscesses are immediately superficial to the gluteus maximus musculature. 2. Peripheral wedge-shaped low-attenuation area in the posterior left mid  kidney could represent focal pyelonephritis. Correlate clinically.  When I initially saw him on 09/01/18, his symptoms of pain were improving. Dr. Excell Seltzer also evaluated him. Recommended continuing Bactrim for 2 weeks and follow up with me in 3 weeks.   However, he was running fevers up to 101F, so he was seen again on 09/12/18, when we ordered an ultrasound-guided aspiration to better evaluate for any fluid in the area, given there were no obvious signs of skin infection at that time. 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   He is here for another follow-up on 09/19/18, and states that over the past few days he has been running fevers up to 102F along with shaking chills. He also complains of increased urinary frequency at night. He states he can only stand for about 5-10 minutes max until the pain starts. He feels very fatigued. He has been on a total of 21 days of Bactrim. He believes the area is red and warm today. Denies nausea/vomiting.  Allergies  No Known Drug Allergies [09/01/2018]: Allergies Reconciled   Medication History  No Current Medications Medications Reconciled  Review of Systems  General Present- Fever. Not Present- Appetite Loss, Chills, Fatigue, Night Sweats, Weight Gain and Weight Loss. Neurological Not Present- Decreased Memory, Fainting, Headaches, Numbness, Seizures, Tingling, Tremor, Trouble walking and Weakness.  Vitals 09/19/2018 3:29 PM Weight: 235.38 lb Height: 72in Body Surface Area: 2.28 m Body Mass Index: 31.92 kg/m  Temp.: 101.48F(Oral)  Pulse: 88 (Regular)  BP: 136/90 (Sitting, Left Arm, Standard)  Physical Exam  GENERAL: Well-developed, well nourished male in no acute distress  EYES: No scleral icterus Pupils equal, lids normal  RESPIRATORY: Normal effort, no use of accessory muscles  MUSCULOSKELETAL: Normal gait Grossly normal ROM upper extremities Grossly  normal ROM lower extremities  PSYCHIATRIC: Normal judgement and insight Normal mood and affect Alert, oriented x 3  Integumentary Note: Left upper outer buttock: There is a mass about 10 cm x 10 cm, with 2 areas of soft tissue swelling. There is overlying blanching erythema and warmth. Cellulitis is present on exam for the first time. There is mild-moderate TTP in certain areas, but no pinpoint area No fluctuance or drainable pockets   Assessment & Plan CELLULITIS OF LEFT BUTTOCK (L03.317) He is presenting for another check of his left upper-outer buttock mass which presented about 8 weeks ago. His history of remote Humira injections in this area along with trauma with a dumbbell complicate this case. 25 cc of bloody, cloudy fluid was aspirated on 09/13/18, growing Mycobacterium fortuitum. Dr. Kae Heller also evaluated the patient today and agreed that there is no fluctuance on exam.  Given the growth of this rare organism and persistent fevers, we believe it is best to refer him to infectious disease to better evaluate which medication would help him the most. We will try to put in a stat referral today so that he can possibly be seen before the holidays.  He is aware that if this area becomes more painful, more red/swollen, begin draining, or he develops higher fevers, he should go to the ER for more urgent ID consultation and possible evaluation by surgery if warranted. He is very appreciative of our input, but it is difficult to give him a definitive explanation today.  Signed electronically by Kellie Shropshire, PA C (09/19/2018 4:41 PM)  Addendum: He has an appt with Dr. Linus Salmons, Infectious Disease on 09/27/18. On 09/20/18, he called the office asking for pain medicine.  I called the patient back and he stated his pain is "noticeably worse today."  I spoke to Dr. Kae Heller, and we both agree that he should go to the ER for evaluation and work up for fevers/cellulits, as well as possible ID consult.  The patient agrees.  Carlena Hurl, PA-C 09/20/18

## 2018-09-22 ENCOUNTER — Encounter (HOSPITAL_COMMUNITY): Payer: Self-pay

## 2018-09-22 ENCOUNTER — Inpatient Hospital Stay (HOSPITAL_COMMUNITY): Payer: BLUE CROSS/BLUE SHIELD | Admitting: Anesthesiology

## 2018-09-22 ENCOUNTER — Other Ambulatory Visit: Payer: Self-pay

## 2018-09-22 ENCOUNTER — Encounter (HOSPITAL_COMMUNITY)
Admission: EM | Disposition: A | Discharge status: Home Health | Payer: Self-pay | Source: Home / Self Care | Attending: Internal Medicine

## 2018-09-22 ENCOUNTER — Emergency Department (HOSPITAL_COMMUNITY): Payer: BLUE CROSS/BLUE SHIELD

## 2018-09-22 ENCOUNTER — Inpatient Hospital Stay (HOSPITAL_COMMUNITY)
Admission: EM | Admit: 2018-09-22 | Discharge: 2018-09-27 | DRG: 580 | Disposition: A | Discharge status: Home Health | Payer: BLUE CROSS/BLUE SHIELD | Attending: Family Medicine | Admitting: Family Medicine

## 2018-09-22 DIAGNOSIS — M793 Panniculitis, unspecified: Secondary | ICD-10-CM | POA: Diagnosis not present

## 2018-09-22 DIAGNOSIS — R509 Fever, unspecified: Secondary | ICD-10-CM | POA: Diagnosis not present

## 2018-09-22 DIAGNOSIS — L03317 Cellulitis of buttock: Secondary | ICD-10-CM | POA: Diagnosis not present

## 2018-09-22 DIAGNOSIS — Z825 Family history of asthma and other chronic lower respiratory diseases: Secondary | ICD-10-CM | POA: Diagnosis not present

## 2018-09-22 DIAGNOSIS — A319 Mycobacterial infection, unspecified: Secondary | ICD-10-CM

## 2018-09-22 DIAGNOSIS — L0231 Cutaneous abscess of buttock: Principal | ICD-10-CM | POA: Diagnosis present

## 2018-09-22 DIAGNOSIS — Z8261 Family history of arthritis: Secondary | ICD-10-CM

## 2018-09-22 DIAGNOSIS — Z8249 Family history of ischemic heart disease and other diseases of the circulatory system: Secondary | ICD-10-CM

## 2018-09-22 DIAGNOSIS — A311 Cutaneous mycobacterial infection: Secondary | ICD-10-CM | POA: Diagnosis present

## 2018-09-22 DIAGNOSIS — F17210 Nicotine dependence, cigarettes, uncomplicated: Secondary | ICD-10-CM | POA: Diagnosis present

## 2018-09-22 DIAGNOSIS — L732 Hidradenitis suppurativa: Secondary | ICD-10-CM | POA: Diagnosis present

## 2018-09-22 DIAGNOSIS — A318 Other mycobacterial infections: Secondary | ICD-10-CM

## 2018-09-22 DIAGNOSIS — N182 Chronic kidney disease, stage 2 (mild): Secondary | ICD-10-CM | POA: Diagnosis present

## 2018-09-22 DIAGNOSIS — L0291 Cutaneous abscess, unspecified: Secondary | ICD-10-CM | POA: Diagnosis present

## 2018-09-22 DIAGNOSIS — N289 Disorder of kidney and ureter, unspecified: Secondary | ICD-10-CM | POA: Diagnosis not present

## 2018-09-22 HISTORY — PX: IRRIGATION AND DEBRIDEMENT ABSCESS: SHX5252

## 2018-09-22 LAB — COMPREHENSIVE METABOLIC PANEL
ALBUMIN: 4.3 g/dL (ref 3.5–5.0)
ALT: 17 U/L (ref 0–44)
ANION GAP: 9 (ref 5–15)
AST: 15 U/L (ref 15–41)
Alkaline Phosphatase: 48 U/L (ref 38–126)
BILIRUBIN TOTAL: 0.8 mg/dL (ref 0.3–1.2)
BUN: 14 mg/dL (ref 6–20)
CALCIUM: 9.2 mg/dL (ref 8.9–10.3)
CHLORIDE: 95 mmol/L — AB (ref 98–111)
CO2: 30 mmol/L (ref 22–32)
CREATININE: 1.36 mg/dL — AB (ref 0.61–1.24)
GFR calc Af Amer: >60 mL/min (ref >60)
GLUCOSE: 119 mg/dL — AB (ref 70–99)
POTASSIUM: 3.8 mmol/L (ref 3.5–5.1)
Sodium: 134 mmol/L — ABNORMAL LOW (ref 135–145)
Total Protein: 9.3 g/dL — ABNORMAL HIGH (ref 6.5–8.1)

## 2018-09-22 LAB — CBC WITH DIFFERENTIAL/PLATELET
Abs Immature Granulocytes: 0.06 10*3/uL (ref 0.00–0.07)
BASOS ABS: 0.1 10*3/uL (ref 0.0–0.1)
BASOS PCT: 0 %
EOS ABS: 0 10*3/uL (ref 0.0–0.5)
EOS PCT: 0 %
HEMATOCRIT: 50.3 % (ref 39.0–52.0)
Hemoglobin: 16.8 g/dL (ref 13.0–17.0)
IMMATURE GRANULOCYTES: 0 %
LYMPHS ABS: 3.9 10*3/uL (ref 0.7–4.0)
Lymphocytes Relative: 28 %
MCH: 32.2 pg (ref 26.0–34.0)
MCHC: 33.4 g/dL (ref 30.0–36.0)
MCV: 96.5 fL (ref 80.0–100.0)
Monocytes Absolute: 1.2 10*3/uL — ABNORMAL HIGH (ref 0.1–1.0)
Monocytes Relative: 9 %
NEUTROS PCT: 63 %
NRBC: 0 % (ref 0.0–0.2)
Neutro Abs: 8.5 10*3/uL — ABNORMAL HIGH (ref 1.7–7.7)
PLATELETS: 323 10*3/uL (ref 150–400)
RBC: 5.21 MIL/uL (ref 4.22–5.81)
RDW: 11.8 % (ref 11.5–15.5)
WBC: 13.7 10*3/uL — AB (ref 4.0–10.5)

## 2018-09-22 SURGERY — IRRIGATION AND DEBRIDEMENT ABSCESS
Anesthesia: General | Site: Buttocks

## 2018-09-22 MED ORDER — SODIUM CHLORIDE 0.9 % IV SOLN
500.0000 mg | Freq: Four times a day (QID) | INTRAVENOUS | Status: DC
  Administered 2018-09-22 – 2018-09-26 (×16): 500 mg via INTRAVENOUS
  Filled 2018-09-22 (×17): qty 500

## 2018-09-22 MED ORDER — HYDROMORPHONE HCL 1 MG/ML IJ SOLN
1.0000 mg | INTRAMUSCULAR | Status: DC | PRN
  Administered 2018-09-22: 1 mg via INTRAVENOUS
  Administered 2018-09-23 (×7): 2 mg via INTRAVENOUS
  Administered 2018-09-23: 1 mg via INTRAVENOUS
  Administered 2018-09-24 – 2018-09-25 (×6): 2 mg via INTRAVENOUS
  Filled 2018-09-22 (×2): qty 2
  Filled 2018-09-22: qty 1
  Filled 2018-09-22 (×4): qty 2
  Filled 2018-09-22: qty 1
  Filled 2018-09-22 (×7): qty 2

## 2018-09-22 MED ORDER — PIPERACILLIN-TAZOBACTAM 3.375 G IVPB
INTRAVENOUS | Status: AC
  Filled 2018-09-22: qty 50

## 2018-09-22 MED ORDER — PROPOFOL 10 MG/ML IV BOLUS
INTRAVENOUS | Status: AC
  Filled 2018-09-22: qty 20

## 2018-09-22 MED ORDER — ONDANSETRON HCL 4 MG/2ML IJ SOLN: 4.0000 mg | Freq: Four times a day (QID) | INTRAMUSCULAR | Status: DC | PRN

## 2018-09-22 MED ORDER — ONDANSETRON HCL 4 MG/2ML IJ SOLN
4.0000 mg | Freq: Four times a day (QID) | INTRAMUSCULAR | Status: DC | PRN
  Administered 2018-09-24 – 2018-09-25 (×2): 4 mg via INTRAVENOUS
  Filled 2018-09-22 (×4): qty 2

## 2018-09-22 MED ORDER — SODIUM CHLORIDE 0.9 % IV SOLN: INTRAVENOUS | Status: AC | PRN

## 2018-09-22 MED ORDER — ALUM & MAG HYDROXIDE-SIMETH 200-200-20 MG/5ML PO SUSP
30.0000 mL | Freq: Four times a day (QID) | ORAL | Status: DC | PRN
  Administered 2018-09-22 – 2018-09-27 (×7): 30 mL via ORAL
  Filled 2018-09-22 (×7): qty 30

## 2018-09-22 MED ORDER — LIDOCAINE 2% (20 MG/ML) 5 ML SYRINGE
INTRAMUSCULAR | Status: DC | PRN
  Administered 2018-09-22: 25 mg via INTRAVENOUS
  Administered 2018-09-22: 75 mg via INTRAVENOUS

## 2018-09-22 MED ORDER — PIPERACILLIN-TAZOBACTAM 3.375 G IVPB
3.3750 g | Freq: Once | INTRAVENOUS | Status: AC
  Administered 2018-09-22: 3.375 g via INTRAVENOUS

## 2018-09-22 MED ORDER — SODIUM CHLORIDE 0.9 % IV BOLUS
500.0000 mL | Freq: Once | INTRAVENOUS | Status: AC
  Administered 2018-09-22: 500 mL via INTRAVENOUS

## 2018-09-22 MED ORDER — ACETAMINOPHEN 160 MG/5ML PO SOLN: 325.0000 mg | ORAL | Status: DC | PRN

## 2018-09-22 MED ORDER — TRAMADOL HCL 50 MG PO TABS
50.0000 mg | ORAL_TABLET | Freq: Four times a day (QID) | ORAL | Status: DC | PRN
  Administered 2018-09-22: 50 mg via ORAL
  Filled 2018-09-22 (×2): qty 1

## 2018-09-22 MED ORDER — ONDANSETRON HCL 4 MG/2ML IJ SOLN: 4.0000 mg | Freq: Once | INTRAMUSCULAR | Status: DC | PRN

## 2018-09-22 MED ORDER — MIDAZOLAM HCL 5 MG/5ML IJ SOLN
INTRAMUSCULAR | Status: DC | PRN
  Administered 2018-09-22: 2 mg via INTRAVENOUS

## 2018-09-22 MED ORDER — OXYMETAZOLINE HCL 0.05 % NA SOLN
1.0000 | NASAL | Status: DC | PRN
  Filled 2018-09-22: qty 15

## 2018-09-22 MED ORDER — LEVOFLOXACIN 750 MG PO TABS
750.0000 mg | ORAL_TABLET | Freq: Every day | ORAL | Status: DC
  Administered 2018-09-22 – 2018-09-27 (×6): 750 mg via ORAL
  Filled 2018-09-22 (×6): qty 1

## 2018-09-22 MED ORDER — IOPAMIDOL (ISOVUE-300) INJECTION 61%
100.0000 mL | Freq: Once | INTRAVENOUS | Status: AC | PRN
  Administered 2018-09-22: 100 mL via INTRAVENOUS

## 2018-09-22 MED ORDER — SENNOSIDES-DOCUSATE SODIUM 8.6-50 MG PO TABS
1.0000 | ORAL_TABLET | Freq: Every evening | ORAL | Status: DC | PRN
  Administered 2018-09-23: 1 via ORAL
  Filled 2018-09-22: qty 1

## 2018-09-22 MED ORDER — ACETAMINOPHEN 325 MG PO TABS
650.0000 mg | ORAL_TABLET | Freq: Four times a day (QID) | ORAL | Status: DC | PRN
  Administered 2018-09-23 – 2018-09-24 (×2): 650 mg via ORAL
  Filled 2018-09-22 (×2): qty 2

## 2018-09-22 MED ORDER — LACTATED RINGERS IV SOLN
INTRAVENOUS | Status: DC | PRN
  Administered 2018-09-22: 13:00:00 via INTRAVENOUS

## 2018-09-22 MED ORDER — FENTANYL CITRATE (PF) 100 MCG/2ML IJ SOLN: 25.0000 ug | INTRAMUSCULAR | Status: DC | PRN

## 2018-09-22 MED ORDER — OXYCODONE HCL 5 MG PO TABS
5.0000 mg | ORAL_TABLET | ORAL | Status: DC | PRN
  Administered 2018-09-22: 10 mg via ORAL
  Administered 2018-09-22: 5 mg via ORAL
  Administered 2018-09-23 – 2018-09-27 (×19): 10 mg via ORAL
  Filled 2018-09-22 (×11): qty 2
  Filled 2018-09-22: qty 1
  Filled 2018-09-22 (×9): qty 2

## 2018-09-22 MED ORDER — IOPAMIDOL (ISOVUE-300) INJECTION 61%
INTRAVENOUS | Status: AC
  Filled 2018-09-22: qty 100

## 2018-09-22 MED ORDER — ACETAMINOPHEN 10 MG/ML IV SOLN: 1000.0000 mg | Freq: Once | INTRAVENOUS | Status: AC

## 2018-09-22 MED ORDER — DIPHENHYDRAMINE HCL 50 MG/ML IJ SOLN
INTRAMUSCULAR | Status: AC
  Filled 2018-09-22: qty 1

## 2018-09-22 MED ORDER — HYDROMORPHONE HCL 2 MG/ML IJ SOLN
INTRAMUSCULAR | Status: AC
  Filled 2018-09-22: qty 1

## 2018-09-22 MED ORDER — FENTANYL CITRATE (PF) 250 MCG/5ML IJ SOLN
INTRAMUSCULAR | Status: AC
  Filled 2018-09-22: qty 5

## 2018-09-22 MED ORDER — FENTANYL CITRATE (PF) 100 MCG/2ML IJ SOLN
INTRAMUSCULAR | Status: DC | PRN
  Administered 2018-09-22: 100 ug via INTRAVENOUS
  Administered 2018-09-22 (×2): 50 ug via INTRAVENOUS
  Administered 2018-09-22 (×2): 100 ug via INTRAVENOUS
  Administered 2018-09-22 (×2): 50 ug via INTRAVENOUS

## 2018-09-22 MED ORDER — ONDANSETRON HCL 4 MG/2ML IJ SOLN
4.0000 mg | Freq: Once | INTRAMUSCULAR | Status: AC
  Administered 2018-09-22: 4 mg via INTRAVENOUS
  Filled 2018-09-22: qty 2

## 2018-09-22 MED ORDER — ENOXAPARIN SODIUM 40 MG/0.4ML ~~LOC~~ SOLN
40.0000 mg | SUBCUTANEOUS | Status: DC
  Administered 2018-09-24 – 2018-09-27 (×4): 40 mg via SUBCUTANEOUS
  Filled 2018-09-22 (×4): qty 0.4

## 2018-09-22 MED ORDER — ACETAMINOPHEN 10 MG/ML IV SOLN
INTRAVENOUS | Status: AC
  Filled 2018-09-22: qty 100

## 2018-09-22 MED ORDER — MIDAZOLAM HCL 2 MG/2ML IJ SOLN
INTRAMUSCULAR | Status: AC
  Filled 2018-09-22: qty 2

## 2018-09-22 MED ORDER — ONDANSETRON HCL 4 MG/2ML IJ SOLN
INTRAMUSCULAR | Status: DC | PRN
  Administered 2018-09-22: 4 mg via INTRAVENOUS

## 2018-09-22 MED ORDER — NICOTINE 7 MG/24HR TD PT24
7.0000 mg | MEDICATED_PATCH | Freq: Every day | TRANSDERMAL | Status: DC
  Administered 2018-09-22 – 2018-09-27 (×6): 7 mg via TRANSDERMAL
  Filled 2018-09-22 (×6): qty 1

## 2018-09-22 MED ORDER — HYDROMORPHONE HCL 1 MG/ML IJ SOLN
1.0000 mg | Freq: Once | INTRAMUSCULAR | Status: AC
  Administered 2018-09-22: 1 mg via INTRAVENOUS
  Filled 2018-09-22: qty 1

## 2018-09-22 MED ORDER — OXYCODONE HCL 5 MG/5ML PO SOLN
5.0000 mg | Freq: Once | ORAL | Status: DC | PRN
  Filled 2018-09-22: qty 5

## 2018-09-22 MED ORDER — ACETAMINOPHEN 10 MG/ML IV SOLN
INTRAVENOUS | Status: DC | PRN
  Administered 2018-09-22: 1000 mg via INTRAVENOUS

## 2018-09-22 MED ORDER — ONDANSETRON HCL 4 MG PO TABS: 4.0000 mg | ORAL_TABLET | Freq: Four times a day (QID) | ORAL | Status: DC | PRN

## 2018-09-22 MED ORDER — PROPOFOL 10 MG/ML IV BOLUS
INTRAVENOUS | Status: DC | PRN
  Administered 2018-09-22: 200 mg via INTRAVENOUS

## 2018-09-22 MED ORDER — ACETAMINOPHEN 325 MG PO TABS: 325.0000 mg | ORAL_TABLET | ORAL | Status: DC | PRN

## 2018-09-22 MED ORDER — SUCCINYLCHOLINE CHLORIDE 200 MG/10ML IV SOSY
PREFILLED_SYRINGE | INTRAVENOUS | Status: DC | PRN
  Administered 2018-09-22: 140 mg via INTRAVENOUS

## 2018-09-22 MED ORDER — HYDROMORPHONE HCL 1 MG/ML IJ SOLN
INTRAMUSCULAR | Status: DC | PRN
  Administered 2018-09-22 (×2): 1 mg via INTRAVENOUS

## 2018-09-22 MED ORDER — MEPERIDINE HCL 50 MG/ML IJ SOLN: 6.2500 mg | INTRAMUSCULAR | Status: DC | PRN

## 2018-09-22 MED ORDER — DEXTROSE-NACL 5-0.9 % IV SOLN
INTRAVENOUS | Status: DC
  Administered 2018-09-22 – 2018-09-23 (×2): via INTRAVENOUS

## 2018-09-22 MED ORDER — OXYCODONE HCL 5 MG PO TABS: 5.0000 mg | ORAL_TABLET | Freq: Once | ORAL | Status: DC | PRN

## 2018-09-22 MED ORDER — ENOXAPARIN SODIUM 40 MG/0.4ML ~~LOC~~ SOLN: 40.0000 mg | SUBCUTANEOUS | Status: DC

## 2018-09-22 MED ORDER — DIPHENHYDRAMINE HCL 50 MG/ML IJ SOLN
INTRAMUSCULAR | Status: DC | PRN
  Administered 2018-09-22: 12.5 mg via INTRAVENOUS

## 2018-09-22 MED ORDER — SODIUM CHLORIDE 0.9 % IR SOLN
Status: DC | PRN
  Administered 2018-09-22: 1000 mL

## 2018-09-22 MED ORDER — LACTATED RINGERS IV SOLN: INTRAVENOUS | Status: DC | PRN

## 2018-09-22 MED ORDER — ONDANSETRON 4 MG PO TBDP: 4.0000 mg | ORAL_TABLET | Freq: Four times a day (QID) | ORAL | Status: DC | PRN

## 2018-09-22 SURGICAL SUPPLY — 35 items
BLADE HEX COATED 2.75 (ELECTRODE) ×2 IMPLANT
BLADE SURG SZ10 CARB STEEL (BLADE) ×4 IMPLANT
COVER WAND RF STERILE (DRAPES) IMPLANT
DECANTER SPIKE VIAL GLASS SM (MISCELLANEOUS) IMPLANT
DRAIN PENROSE 18X1/2 LTX STRL (DRAIN) ×2 IMPLANT
DRAPE LAPAROTOMY T 102X78X121 (DRAPES) ×2 IMPLANT
DRAPE SHEET LG 3/4 BI-LAMINATE (DRAPES) IMPLANT
ELECT PENCIL ROCKER SW 15FT (MISCELLANEOUS) ×2 IMPLANT
ELECT REM PT RETURN 15FT ADLT (MISCELLANEOUS) ×2 IMPLANT
EVACUATOR SILICONE 100CC (DRAIN) IMPLANT
GAUZE PACKING IODOFORM 1/4X15 (GAUZE/BANDAGES/DRESSINGS) ×4 IMPLANT
GAUZE SPONGE 4X4 12PLY STRL (GAUZE/BANDAGES/DRESSINGS) ×2 IMPLANT
GLOVE BIO SURGEON STRL SZ7.5 (GLOVE) ×4 IMPLANT
GLOVE BIOGEL PI IND STRL 7.0 (GLOVE) ×1 IMPLANT
GLOVE BIOGEL PI INDICATOR 7.0 (GLOVE) ×1
GOWN STRL REUS W/ TWL XL LVL3 (GOWN DISPOSABLE) ×1 IMPLANT
GOWN STRL REUS W/TWL LRG LVL3 (GOWN DISPOSABLE) ×2 IMPLANT
GOWN STRL REUS W/TWL XL LVL3 (GOWN DISPOSABLE) ×1 IMPLANT
KIT BASIN OR (CUSTOM PROCEDURE TRAY) ×2 IMPLANT
MARKER SKIN DUAL TIP RULER LAB (MISCELLANEOUS) IMPLANT
NEEDLE HYPO 25X1 1.5 SAFETY (NEEDLE) ×2 IMPLANT
NS IRRIG 1000ML POUR BTL (IV SOLUTION) ×2 IMPLANT
PACK BASIC VI WITH GOWN DISP (CUSTOM PROCEDURE TRAY) ×2 IMPLANT
PAD ABD 8X10 STRL (GAUZE/BANDAGES/DRESSINGS) ×2 IMPLANT
SOL PREP POV-IOD 4OZ 10% (MISCELLANEOUS) ×2 IMPLANT
SPONGE LAP 18X18 RF (DISPOSABLE) ×2 IMPLANT
SPONGE LAP 4X18 RFD (DISPOSABLE) ×2 IMPLANT
STAPLER VISISTAT 35W (STAPLE) IMPLANT
SUT ETHILON 2 0 PS N (SUTURE) ×2 IMPLANT
SUT VIC AB 3-0 SH 18 (SUTURE) IMPLANT
SYR CONTROL 10ML LL (SYRINGE) ×2 IMPLANT
TAPE CLOTH SURG 6X10 WHT LF (GAUZE/BANDAGES/DRESSINGS) ×2 IMPLANT
TOWEL OR 17X26 10 PK STRL BLUE (TOWEL DISPOSABLE) ×2 IMPLANT
WATER STERILE IRR 1000ML POUR (IV SOLUTION) ×2 IMPLANT
YANKAUER SUCT BULB TIP 10FT TU (MISCELLANEOUS) IMPLANT

## 2018-09-22 NOTE — Progress Notes (Signed)
Pharmacy Antibiotic Note  Justin Baldwin is a 42 y.o. male admitted on 09/22/2018 with medical history significant for Hidradenitis (previously on Humira) with several weeks of painful mass on left buttocks. Found to have abscess growing mycobacterium fortuitum.  Pharmacy has been consulted for primaxin dosing.  Plan: primaxin 500mg  IV q6h Follow renal function and clinical course  Height: 6' (182.9 cm) Weight: 235 lb (106.6 kg) IBW/kg (Calculated) : 77.6  Temp (24hrs), Avg:99.3 F (37.4 C), Min:98.7 F (37.1 C), Max:99.7 F (37.6 C)  Recent Labs  Lab 09/22/18 0828  WBC 13.7*  CREATININE 1.36*    Estimated Creatinine Clearance: 89.3 mL/min (A) (by C-G formula based on SCr of 1.36 mg/dL (H)).    No Known Allergies  Antimicrobials this admission: 11/29 zosyn x1 11/29 primaxin >>   Dose adjustments this admission:   Microbiology results: 11/29 abscess: 11/29 BCx:  Thank you for allowing pharmacy to be a part of this patient's care.  Dolly Rias RPh 09/22/2018, 5:01 PM Pager [phone]

## 2018-09-22 NOTE — Op Note (Signed)
09/22/2018  2:46 PM  PATIENT:  Justin Baldwin  42 y.o. male  PRE-OPERATIVE DIAGNOSIS:  gluteal abscess  POST-OPERATIVE DIAGNOSIS:  Gluteal abscess  PROCEDURE:  Procedure(s): IRRIGATION AND DEBRIDEMENT ABSCESS (N/A)  SURGEON:  Surgeon(s) and Role:    Ralene Ok, MD - Primary  ANESTHESIA:   general  EBL:  15cc   BLOOD ADMINISTERED:none  DRAINS: 1.5 bottles of 2 inch iodoform gauze  LOCAL MEDICATIONS USED:  NONE  SPECIMEN:  Source of Specimen: Left gluteal culture-aerobic, anaerobic, AFB  DISPOSITION OF SPECIMEN: Microbiology  COUNTS:  YES  TOURNIQUET:  * No tourniquets in log *  DICTATION: .Dragon Dictation  Indication procedure: Patient is a 42 year old male with a history of a left gluteal cellulitis which is coalesced a left gluteal abscess.  Patient underwent CT scan in the ED was seen to have a large gluteal abscess.  Patient was taken back urgently to the operating room for I&D of abscess.  Details of procedure: After the patient was consented he was taken back to the OR and placed in the supine position with bilateral SCDs in place.  He underwent general trach intubation.  He was then placed in the prone position.  Patient was prepped and draped in sterile fashion.  A timeout was called all facts verified.  A 3 to 4 cm incision was made over the area of the greatest amount of erythema.  Cautery was used to maintain hemostasis dissection was taken down to the suprafascial level.  At this time a large amount of purulence was easily expressed.  Aerobic, anaerobic, AFB cultures were then obtained.  There was a large pocket with multiple loculations.  These were broken up with blunt dissection.  The area extended out approximately 10 to 15 cm medially from the incision site.  At this time a superior medial counterincision was made.  The area was irrigated out with sterile saline.  At this time 1/2 inch Penrose drain was then sewn to connect the counterincision to the  incision.  This was sutured in using a 2-0 nylon.  At this time 2 inch iodoform gauze was then used to pack the wound.  1.5 bottles of this was used.  The wound was then dressed with ABD pad, 4 x 4's and mesh panties.  The patient tired procedure well was taken to the recovery in stable condition.    PLAN OF CARE: Admit for overnight observation  PATIENT DISPOSITION:  PACU - hemodynamically stable.   Delay start of Pharmacological VTE agent (>24hrs) due to surgical blood loss or risk of bleeding: yes

## 2018-09-22 NOTE — ED Provider Notes (Addendum)
[facility] DEPT Provider Note   CSN: [id number] Arrival date & time: 09/22/18  0757     History   Chief Complaint Chief Complaint  Patient presents with  . Fever  . Abscess    HPI Justin Baldwin is a 42 y.o. male.  Patient complains of fever for 3 weeks.  He is also had a cellulitis and small abscess to the left buttocks has been taking care of by general surgery.  He was seen this week and the symptoms seem to be getting worse.  He has appoint with infectious disease next week  The history is provided by the patient. No language interpreter was used.  Fever   This is a new problem. The current episode started more than 1 week ago. The problem occurs constantly. The problem has been gradually worsening. His temperature was unmeasured prior to arrival. Pertinent negatives include no chest pain, no diarrhea, no congestion, no headaches and no cough. He has tried nothing for the symptoms. The treatment provided no relief.    Past Medical History:  Diagnosis Date  . Erectile dysfunction   . Insomnia   . Skin abnormality    hidradenitis suppurativa     Patient Active Problem List   Diagnosis Date Noted  . Abscess 09/22/2018  . Lump 08/31/2018  . Deep tissue injury 08/25/2018  . Skin rash 07/25/2018  . Fatigue 11/08/2017  . Vertigo 07/11/2017  . Preventative health care 03/29/2016  . Insomnia 03/15/2016  . Erectile dysfunction 03/15/2016    History reviewed. No pertinent surgical history.      Home Medications    Prior to Admission medications   Medication Sig Start Date End Date Taking? Authorizing Provider  acetaminophen (TYLENOL) 325 MG tablet Take 650 mg by mouth every 6 (six) hours as needed for mild pain or headache.   Yes [provider]  oxymetazoline (AFRIN) 0.05 % nasal spray Place 1-2 sprays into both nostrils as needed for congestion.    Yes [provider]  sulfamethoxazole-trimethoprim (BACTRIM  DS,SEPTRA DS) 800-160 MG tablet Take 1 tablet by mouth 2 (two) times daily. 08/31/18   Tower, Wynelle Fanny, MD    Family History Family History  Problem Relation Age of Onset  . Heart attack Father   . Emphysema Paternal Grandmother   . Arthritis Mother     Social History Social History   Tobacco Use  . Smoking status: Current Every Day Smoker    Packs/day: 1.00    Types: Cigarettes  . Smokeless tobacco: Former Systems developer    Quit date: 02/14/2016  Substance Use Topics  . Alcohol use: Yes    Alcohol/week: 0.0 standard drinks    Comment: occasinal   . Drug use: No     Allergies   Patient has no known allergies.   Review of Systems Review of Systems  Constitutional: Positive for fever. Negative for appetite change and fatigue.  HENT: Negative for congestion, ear discharge and sinus pressure.   Eyes: Negative for discharge.  Respiratory: Negative for cough.   Cardiovascular: Negative for chest pain.  Gastrointestinal: Negative for abdominal pain and diarrhea.  Genitourinary: Negative for frequency and hematuria.  Musculoskeletal: Negative for back pain.       Buttocks with swelling tenderness  Skin: Negative for rash.  Neurological: Negative for seizures and headaches.  Psychiatric/Behavioral: Negative for hallucinations.     Physical Exam Updated Vital Signs BP 133/76 (BP Location: Right Arm)   Pulse (!) 59   Temp  99.1 F (37.3 C) (Oral)   Resp 16   Ht 6' (1.829 m)   Wt 106.6 kg   SpO2 98%   BMI 31.87 kg/m   Physical Exam  Constitutional: He is oriented to person, place, and time. He appears well-developed.  HENT:  Head: Normocephalic.  Eyes: Conjunctivae and EOM are normal. No scleral icterus.  Neck: Neck supple. No thyromegaly present.  Cardiovascular: Normal rate and regular rhythm. Exam reveals no gallop and no friction rub.  No murmur heard. Pulmonary/Chest: No stridor. He has no wheezes. He has no rales. He exhibits no tenderness.  Abdominal: He exhibits  no distension. There is no tenderness. There is no rebound.  Genitourinary:  Genitourinary Comments: Patient has a large area to the left buttocks with red tender indurated  Musculoskeletal: Normal range of motion. He exhibits no edema.  Lymphadenopathy:    He has no cervical adenopathy.  Neurological: He is oriented to person, place, and time. He exhibits normal muscle tone. Coordination normal.  Skin: No rash noted. No erythema.  Psychiatric: He has a normal mood and affect. His behavior is normal.     ED Treatments / Results  Labs (all labs ordered are listed, but only abnormal results are displayed) Labs Reviewed  CBC WITH DIFFERENTIAL/PLATELET - Abnormal; Notable for the following components:      Result Value   WBC 13.7 (*)    Neutro Abs 8.5 (*)    Monocytes Absolute 1.2 (*)    All other components within normal limits  COMPREHENSIVE METABOLIC PANEL - Abnormal; Notable for the following components:   Sodium 134 (*)    Chloride 95 (*)    Glucose, Bld 119 (*)    Creatinine, Ser 1.36 (*)    Total Protein 9.3 (*)    All other components within normal limits  CULTURE, BLOOD (ROUTINE X 2)  CULTURE, BLOOD (ROUTINE X 2)    EKG None  Radiology Ct Abdomen Pelvis W Contrast  Result Date: 09/22/2018 CLINICAL DATA:  Left lower abdominal/pelvic pain EXAM: CT ABDOMEN AND PELVIS WITH CONTRAST TECHNIQUE: Multidetector CT imaging of the abdomen and pelvis was performed using the standard protocol following bolus administration of intravenous contrast. CONTRAST:  128mL ISOVUE-300 IOPAMIDOL (ISOVUE-300) INJECTION 61% COMPARISON:  August 31, 2018 FINDINGS: Lower chest: Lung bases are clear. Hepatobiliary: No focal liver lesions are appreciable. Gallbladder wall is not appreciably thickened. There is no biliary duct dilatation. Pancreas: No pancreatic mass or inflammatory focus. Spleen: No splenic lesions are evident. Adrenals/Urinary Tract: Adrenals bilaterally appear unremarkable. There is  again noted a wedge-shaped area of decreased attenuation in the lateral mid left kidney, unchanged from recent CT. No well-defined mass seen in either kidney. No hydronephrosis evident on either side. No renal or ureteral calculus evident on either side. Urinary bladder is midline with wall thickness within normal limits. Stomach/Bowel: There are occasional sigmoid diverticula without diverticulitis. There is no appreciable bowel wall or mesenteric thickening. No bowel obstruction. No free air or portal venous air. Vascular/Lymphatic: No abdominal aortic aneurysm. No appreciable vascular lesions evident. No adenopathy appreciable in the abdomen or pelvis. Reproductive: Prostate and seminal vesicles are normal in size and contour. There is no evident pelvic mass. Other: Appendix appears unremarkable. No abscess or ascites is noted within the peritoneum or retroperitoneal regions. There is a rather minimal ventral hernia containing only fat. Musculoskeletal: There is a loculated abscess in the left buttocks region which abuts the gluteus maximus muscle in its mid to lateral aspects. This abscess  currently measures 11.1 x 3.7 x 14.5 cm. There is overlying cellulitis and adjacent panniculitis in the fat between the skin surface and in the gluteus maximus muscle. There is milder edema in the soft tissues on the right in the buttocks region adjacent to the gluteus maximus muscle without well-defined fluid. No abscess is seen in this area. There are foci of degenerative change in the lumbar spine. There are no blastic or lytic bone lesions. IMPRESSION: 1. Progression of inflammatory change in the left buttocks region compared to prior CT. There is a loculated abscess in this area currently measuring 11.1 x 3.7 x 14.5 cm. Specifically, this loculated abscess abuts the gluteus maximus muscle posterolaterally. There are areas which suggest early muscle invasion from this loculated abscess. There is adjacent panniculitis and  cellulitis posterolaterally in the left lower pelvic/upper thigh regions. 2. On the right in the posterior buttocks region adjacent to the gluteus maximus muscle, there is soft tissue thickening but no frank abscess or fluid. 3. A small wedge-shaped area of decreased attenuation in the mid left kidney laterally is again noted, stable, a finding that could represent localized lobar nephronia/acute bacterial nephritis. No surrounding perinephric stranding or fluid. No hydronephrosis on either side. No renal or ureteral calculus on either side. 4. Occasional sigmoid diverticula without diverticulitis. No bowel obstruction. No abscess within the peritoneum or retroperitoneum. Appendix appears normal. Electronically Signed   By: Lowella Grip III M.D.   On: 09/22/2018 10:02    Procedures Procedures (including critical care time)  Medications Ordered in ED Medications  iopamidol (ISOVUE-300) 61 % injection (has no administration in time range)  sodium chloride 0.9 % bolus 500 mL (500 mLs Intravenous New Bag/Given 09/22/18 0923)  iopamidol (ISOVUE-300) 61 % injection 100 mL (100 mLs Intravenous Contrast Given 09/22/18 0935)  HYDROmorphone (DILAUDID) injection 1 mg (1 mg Intravenous Given 09/22/18 1105)  ondansetron (ZOFRAN) injection 4 mg (4 mg Intravenous Given 09/22/18 1105)     Initial Impression / Assessment and Plan / ED Course  I have reviewed the triage vital signs and the nursing notes.  Pertinent labs & imaging results that were available during my care of the patient were reviewed by me and considered in my medical decision making (see chart for details).     CT scan shows large abscess to left buttocks.  I spoke with infectious disease and they stated to hold off on antibiotics right now and they will come consult on the patient.  I spoke with hospitalist who will admit the patient and I am waiting on general surgery to call back.  Patient will be admitted to medicine   I spoke to  general surgery and after reviewing the CT scan the general surgeon decided the patient should go to the operating room as soon as possible to drain his abscess. Final Clinical Impressions(s) / ED Diagnoses   Final diagnoses:  None    ED Discharge Orders    None       Milton Ferguson, MD 09/22/18 1134    Milton Ferguson, MD 09/22/18 1243

## 2018-09-22 NOTE — Transfer of Care (Signed)
Immediate Anesthesia Transfer of Care Note  Patient: Justin Baldwin  Procedure(s) Performed: IRRIGATION AND DEBRIDEMENT ABSCESS (N/A Buttocks)  Patient Location: PACU  Anesthesia Type:General  Level of Consciousness: awake, alert , oriented and patient cooperative  Airway & Oxygen Therapy: Patient Spontanous Breathing and Patient connected to face mask oxygen  Post-op Assessment: Report given to RN, Post -op Vital signs reviewed and stable and Patient moving all extremities X 4  Post vital signs: stable  Last Vitals:  Vitals Value Taken Time  BP 146/83 09/22/2018  3:01 PM  Temp 37.6 C 09/22/2018  2:57 PM  Pulse 67 09/22/2018  3:04 PM  Resp 7 09/22/2018  3:04 PM  SpO2 100 % 09/22/2018  3:04 PM  Vitals shown include unvalidated device data.  Last Pain:  Vitals:   09/22/18 1457  TempSrc:   PainSc: 8          Complications: No apparent anesthesia complications

## 2018-09-22 NOTE — Consult Note (Signed)
Reason for Consult: Gluteal abscess Referring Physician: Dr. Galvin Proffer Justin Baldwin is an 42 y.o. male.  HPI: The patient is a 42 year old male who presents with a complaint of an upper outer left buttock mass.  He initially presented to urgent office at [facility] on 09/01/18, per request of his PCP for evaluation of a mass which appeared about 5 weeks prior, on his upper, outer left buttock. He has now been dealing with this for 8 weeks. About one month prior to a lump in this area, he was injecting Humira (for hidradenitis) in the surrounding tissue. A month later, he injured this area with a dumbbell. Three weeks prior to seeing me, he went to an urgent care to have this area evaluated and was prescribed Bactrim which he completed. He was also on Doxycycline at some point.  He followed up with his PCP on 08/31/18, who was concerned about his temperature of 99.2, with possibly more warmth and pain in the area. She put him back on Bactrim and ordered a CT scan which showed:  Other: Within the soft tissues of the left buttocks there is soft tissue strandiness consistent with cellulitis. Also there are low-attenuation fluid collections just superficial to the musculature of the gluteus maximus muscle consistent with small abscesses. There may be some involvement of the superficial aspect of the lateral left gluteus maximus musculature. No deeper extension of these probable abscesses is seen. Similar but smaller areas are noted within the right buttocks as well. These could be due to prior injections  IMPRESSION: 1. Probable superficial soft tissue abscesses within the buttocks left much greater than right with adjacent cellulitis left-greater-than-right. These apparent abscesses are immediately superficial to the gluteus maximus musculature. 2. Peripheral wedge-shaped low-attenuation area in the posterior left mid kidney could represent focal pyelonephritis. Correlate clinically.  When I  initially saw him on 09/01/18, his symptoms of pain were improving. Dr. Excell Seltzer also evaluated him. Recommended continuing Bactrim for 2 weeks and follow up with me in 3 weeks.   However, he was running fevers up to 101F, so he was seen again on 09/12/18, when we ordered an ultrasound-guided aspiration to better evaluate for any fluid in the area, given there were no obvious signs of skin infection at that time. 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   Patient is continue with increased pain to the area.  Patient underwent CT scan the ED which I did review personally.  There was a large abscess seen just superior to the gluteus maximus.  Secondary to continued chronic abscess and infection general surgery was consulted for further evaluation and management  Past Medical History:  Diagnosis Date  . Erectile dysfunction   . Insomnia   . Skin abnormality    hidradenitis suppurativa     History reviewed. No pertinent surgical history.  Family History  Problem Relation Age of Onset  . Heart attack Father   . Emphysema Paternal Grandmother   . Arthritis Mother     Social History:  reports that he has been smoking cigarettes. He has been smoking about 1.00 pack per day. He quit smokeless tobacco use about 2 years ago. He reports that he drinks alcohol. He reports that he does not use drugs.  Allergies: No Known Allergies  Medications: I have reviewed the patient's current medications.  Results for orders placed or performed during the hospital encounter of 09/22/18 (from the past 48 hour(s))  CBC with  Differential/Platelet     Status: Abnormal   Collection Time: 09/22/18  8:28 AM  Result Value Ref Range   WBC 13.7 (H) 4.0 - 10.5 K/uL   RBC 5.21 4.22 - 5.81 MIL/uL   Hemoglobin 16.8 13.0 - 17.0 g/dL   HCT 50.3 39.0 - 52.0 %   MCV 96.5 80.0 - 100.0 fL   MCH 32.2 26.0 - 34.0 pg   MCHC 33.4 30.0 - 36.0 g/dL   RDW 11.8 11.5 - 15.5 %    Platelets 323 150 - 400 K/uL   nRBC 0.0 0.0 - 0.2 %   Neutrophils Relative % 63 %   Neutro Abs 8.5 (H) 1.7 - 7.7 K/uL   Lymphocytes Relative 28 %   Lymphs Abs 3.9 0.7 - 4.0 K/uL   Monocytes Relative 9 %   Monocytes Absolute 1.2 (H) 0.1 - 1.0 K/uL   Eosinophils Relative 0 %   Eosinophils Absolute 0.0 0.0 - 0.5 K/uL   Basophils Relative 0 %   Basophils Absolute 0.1 0.0 - 0.1 K/uL   Immature Granulocytes 0 %   Abs Immature Granulocytes 0.06 0.00 - 0.07 K/uL    Comment: Performed at [facility], [place] [address], [place], [place] [address]  Comprehensive metabolic panel     Status: Abnormal   Collection Time: 09/22/18  8:28 AM  Result Value Ref Range   Sodium 134 (L) 135 - 145 mmol/L   Potassium 3.8 3.5 - 5.1 mmol/L   Chloride 95 (L) 98 - 111 mmol/L   CO2 30 22 - 32 mmol/L   Glucose, Bld 119 (H) 70 - 99 mg/dL   BUN 14 6 - 20 mg/dL   Creatinine, Ser 1.36 (H) 0.61 - 1.24 mg/dL   Calcium 9.2 8.9 - 10.3 mg/dL   Total Protein 9.3 (H) 6.5 - 8.1 g/dL   Albumin 4.3 3.5 - 5.0 g/dL   AST 15 15 - 41 U/L   ALT 17 0 - 44 U/L   Alkaline Phosphatase 48 38 - 126 U/L   Total Bilirubin 0.8 0.3 - 1.2 mg/dL   GFR calc non Af Amer >60 >60 mL/min   GFR calc Af Amer >60 >60 mL/min   Anion gap 9 5 - 15    Comment: Performed at [facility], [place] [address] [place], [place] [address]    Ct Abdomen Pelvis W Contrast  Result Date: 09/22/2018 CLINICAL DATA:  Left lower abdominal/pelvic pain EXAM: CT ABDOMEN AND PELVIS WITH CONTRAST TECHNIQUE: Multidetector CT imaging of the abdomen and pelvis was performed using the standard protocol following bolus administration of intravenous contrast. CONTRAST:  167mL ISOVUE-300 IOPAMIDOL (ISOVUE-300) INJECTION 61% COMPARISON:  August 31, 2018 FINDINGS: Lower chest: Lung bases are clear. Hepatobiliary: No focal liver lesions are appreciable. Gallbladder wall is not appreciably thickened. There is no biliary duct dilatation.  Pancreas: No pancreatic mass or inflammatory focus. Spleen: No splenic lesions are evident. Adrenals/Urinary Tract: Adrenals bilaterally appear unremarkable. There is again noted a wedge-shaped area of decreased attenuation in the lateral mid left kidney, unchanged from recent CT. No well-defined mass seen in either kidney. No hydronephrosis evident on either side. No renal or ureteral calculus evident on either side. Urinary bladder is midline with wall thickness within normal limits. Stomach/Bowel: There are occasional sigmoid diverticula without diverticulitis. There is no appreciable bowel wall or mesenteric thickening. No bowel obstruction. No free air or portal venous air. Vascular/Lymphatic: No abdominal aortic aneurysm. No appreciable vascular lesions evident. No adenopathy appreciable  in the abdomen or pelvis. Reproductive: Prostate and seminal vesicles are normal in size and contour. There is no evident pelvic mass. Other: Appendix appears unremarkable. No abscess or ascites is noted within the peritoneum or retroperitoneal regions. There is a rather minimal ventral hernia containing only fat. Musculoskeletal: There is a loculated abscess in the left buttocks region which abuts the gluteus maximus muscle in its mid to lateral aspects. This abscess currently measures 11.1 x 3.7 x 14.5 cm. There is overlying cellulitis and adjacent panniculitis in the fat between the skin surface and in the gluteus maximus muscle. There is milder edema in the soft tissues on the right in the buttocks region adjacent to the gluteus maximus muscle without well-defined fluid. No abscess is seen in this area. There are foci of degenerative change in the lumbar spine. There are no blastic or lytic bone lesions. IMPRESSION: 1. Progression of inflammatory change in the left buttocks region compared to prior CT. There is a loculated abscess in this area currently measuring 11.1 x 3.7 x 14.5 cm. Specifically, this loculated abscess  abuts the gluteus maximus muscle posterolaterally. There are areas which suggest early muscle invasion from this loculated abscess. There is adjacent panniculitis and cellulitis posterolaterally in the left lower pelvic/upper thigh regions. 2. On the right in the posterior buttocks region adjacent to the gluteus maximus muscle, there is soft tissue thickening but no frank abscess or fluid. 3. A small wedge-shaped area of decreased attenuation in the mid left kidney laterally is again noted, stable, a finding that could represent localized lobar nephronia/acute bacterial nephritis. No surrounding perinephric stranding or fluid. No hydronephrosis on either side. No renal or ureteral calculus on either side. 4. Occasional sigmoid diverticula without diverticulitis. No bowel obstruction. No abscess within the peritoneum or retroperitoneum. Appendix appears normal. Electronically Signed   By: Lowella Grip III M.D.   On: 09/22/2018 10:02    Review of Systems  Constitutional: Positive for chills and fever. Negative for malaise/fatigue.  HENT: Negative for ear discharge, hearing loss and sore throat.   Eyes: Negative for blurred vision and discharge.  Respiratory: Negative for cough and shortness of breath.   Cardiovascular: Negative for chest pain, orthopnea and leg swelling.  Gastrointestinal: Negative for abdominal pain, constipation, diarrhea, heartburn, nausea and vomiting.  Musculoskeletal: Negative for myalgias and neck pain.  Skin: Negative for itching and rash.  Neurological: Negative for dizziness, focal weakness, seizures and loss of consciousness.  Endo/Heme/Allergies: Negative for environmental allergies. Does not bruise/bleed easily.  Psychiatric/Behavioral: Negative for depression and suicidal ideas.  All other systems reviewed and are negative.  Blood pressure 133/76, pulse (!) 59, temperature 99.1 F (37.3 C), temperature source Oral, resp. rate 16, height 6' (1.829 m), weight 106.6  kg, SpO2 98 %. Physical Exam  Constitutional: He is oriented to person, place, and time. Vital signs are normal. He appears well-developed and well-nourished.  Conversant No acute distress  Eyes: Lids are normal. No scleral icterus.  No lid lag Moist conjunctiva  Neck: No tracheal tenderness present. No thyromegaly present.  No cervical lymphadenopathy  Cardiovascular: Normal rate, regular rhythm and intact distal pulses.  No murmur heard. Respiratory: Effort normal and breath sounds normal. He has no wheezes. He has no rales.  GI: There is no hepatosplenomegaly. There is no tenderness. No hernia.  Musculoskeletal:       Legs: Tenderness to touch to the left lateral gluteal region  Neurological: He is alert and oriented to person, place, and time.  Normal gait and station  Skin: Skin is warm. No rash noted. No cyanosis. Nails show no clubbing.  Normal skin turgor  Psychiatric: Judgment normal.  Appropriate affect    Assessment/Plan: 42 year old male with large left gluteal abscess 1.  We will proceed to the operating room for I&D of gluteal abscess. 2.  Discussed with him the risks and benefits of the procedure to include but not limited to: Infection, bleeding, demonstrated structures, possible recurrence and/or need for further surgery.  Patient voiced understanding wishes to proceed.  Ralene Ok 09/22/2018, 1:17 PM

## 2018-09-22 NOTE — Consult Note (Signed)
Date of Admission:  09/22/2018          Reason for Consult: M fortuitum abscess   Referring Provider: Dr. Roderic Palau   Assessment:  1. Mycobacterium fortuitum abscess of buttocks 2. History of treatment for hidradenitis  with Humira 3. Tattoos  Plan:  1. I am initiating therapy with imipenem and levofloxacin though I would personally prefer moxifloxacin. 2. I would consider adding a third drug such as Bactrim though the patient was on Bactrim for a month prior. 3. His Mycobacterium fortuitum culture absolutely needs to be sent for susceptibility testing and I hope this was done with the prior isolate but if not it can be done with the new AFB culture that was obtained in the operating room  Active Problems:   Abscess   Abscess of buttock, left   Hydradenitis   Kidney insufficiency   Scheduled Meds: . diphenhydrAMINE      . [START ON 09/23/2018] enoxaparin (LOVENOX) injection  40 mg Subcutaneous Q24H  . iopamidol      . levofloxacin  750 mg Oral Daily   Continuous Infusions: . sodium chloride    . acetaminophen    . acetaminophen    . dextrose 5 % and 0.9% NaCl 100 mL/hr at 09/22/18 1649  . imipenem-cilastatin    . piperacillin-tazobactam     PRN Meds:.sodium chloride, acetaminophen, HYDROmorphone (DILAUDID) injection, ondansetron **OR** ondansetron (ZOFRAN) IV, oxyCODONE, oxymetazoline, senna-docusate, traMADol  HPI: Justin Baldwin is a 42 y.o. male  with medical history significant for Hidradenitis (previously on Humira) who presents on 09/22/2018 with several weeks of painful mass on left buttocks found to be left buttocks abscess on 11/7 on bactrim x 21 days with failed outpatient treatment with persistent fevers and pain.     He reports 2 months ago hitting himself with a dumbbell where he noticed a lump. It was initially thought to be a hematoma/deep tissue injury. There was then a concern for infection because it didn't go down in size.  11/7 evaluated by PCP who  obtain CT abdomen which showed superficial soft tissue abscess of left buttocks with adjacent cellulitis  prompting initiation of bactrim x 2weeks .  Of note prior to that, he was on doxycycline x 14 days prescribed at an urgent care visit for an "inflamed ankle"  He was then evaluated by [facility] surgery on 11/8 where he was recommended to continue Bactrim for additional 2 weeks.  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  He was referred to be seen by [place] in infectious disease and had an appointment in a few days.  He apparently stopped his Humira more than 2 months ago.  He has been febrile for the past nearly 19 days with subjective chills and sweats.  He came to the ER due to worsening pain fever and concern for worsening infection.  In the ER imaging was done with a CT scan  Progression of inflammatory change in the left buttocks region compared to prior CT. There is a loculated abscess in this area currently measuring 11.1 x 3.7 x 14.5 cm. Specifically, this loculated abscess abuts the gluteus maximus muscle posterolaterally. There are areas which suggest early muscle invasion from this loculated abscess. There is adjacent panniculitis and cellulitis posterolaterally in the left lower pelvic/upper thigh regions.  2. On the right in the posterior buttocks region adjacent to the gluteus maximus muscle, there is soft tissue thickening but no  frank abscess or fluid.  3. A small wedge-shaped area of decreased attenuation in the mid left kidney laterally is again noted, stable, a finding that could represent localized lobar nephronia/acute bacterial nephritis. No surrounding perinephric stranding or fluid. No hydronephrosis on either side. No renal or ureteral calculus on either side.  4. Occasional sigmoid diverticula without diverticulitis. No bowel obstruction. No abscess within the peritoneum or retroperiton  He  was taken to the operating room by general surgery who performed extensive incision and debridement of a gluteal abscess.  Material is been sent for culture.  Fortunately M fortuitum is typically a more sensitive organism I do hope that the sensitivities were sent on the organism isolated on routine bacterial culture.  I will check with the microbiology lab if not we will have to hope that it grows on the culture was in the OR and send it a second time.  In the interim I will start him on imipenem and a fluoroquinolone my preference being for moxifloxacin but we can start levofloxacin since it is not on formulary.  We may then add Bactrim to this and hopefully will have susceptibility data to guide further therapy  I will screen him for HIV and hepatitis C if not already done so.  Review of Systems: Review of Systems  Constitutional: Negative for chills, diaphoresis, fever, malaise/fatigue and weight loss.  HENT: Negative for congestion, hearing loss, sore throat and tinnitus.   Eyes: Negative for blurred vision and double vision.  Respiratory: Negative for cough, sputum production, shortness of breath and wheezing.   Cardiovascular: Negative for chest pain, palpitations and leg swelling.  Gastrointestinal: Negative for abdominal pain, blood in stool, constipation, diarrhea, heartburn, melena, nausea and vomiting.  Genitourinary: Negative for dysuria, flank pain and hematuria.  Musculoskeletal: Positive for back pain and myalgias. Negative for falls and joint pain.  Skin: Negative for itching and rash.  Neurological: Negative for dizziness, sensory change, focal weakness, loss of consciousness, weakness and headaches.  Endo/Heme/Allergies: Does not bruise/bleed easily.  Psychiatric/Behavioral: Negative for depression, memory loss and suicidal ideas. The patient is not nervous/anxious.     Past Medical History:  Diagnosis Date  . Erectile dysfunction   . Insomnia   . Skin abnormality     hidradenitis suppurativa     Social History   Tobacco Use  . Smoking status: Current Every Day Smoker    Packs/day: 1.00    Types: Cigarettes  . Smokeless tobacco: Former Systems developer    Quit date: 02/14/2016  Substance Use Topics  . Alcohol use: Yes    Alcohol/week: 0.0 standard drinks    Comment: occasinal   . Drug use: No    Family History  Problem Relation Age of Onset  . Heart attack Father   . Emphysema Paternal Grandmother   . Arthritis Mother    No Known Allergies  OBJECTIVE: Blood pressure 120/75, pulse (!) 58, temperature 99 F (37.2 C), temperature source Oral, resp. rate 17, height 6' (1.829 m), weight 106.6 kg, SpO2 94 %.  Physical Exam  Constitutional: He is oriented to person, place, and time. He appears well-developed and well-nourished. He is cooperative. He does not appear ill. No distress.  HENT:  Head: Normocephalic and atraumatic.  Right Ear: Hearing and external ear normal.  Left Ear: Hearing and external ear normal.  Nose: No rhinorrhea or nasal deformity. No epistaxis.  Eyes: Pupils are equal, round, and reactive to light. Conjunctivae and EOM are normal. Right conjunctiva is not injected. Left  conjunctiva is not injected. No scleral icterus.  Neck: Normal range of motion. Neck supple. No JVD present.  Cardiovascular: Normal rate, regular rhythm, S1 normal and S2 normal. Exam reveals no friction rub.  Pulmonary/Chest: Effort normal. No respiratory distress. He has no wheezes.  Abdominal: Soft. Normal appearance and bowel sounds are normal. He exhibits no distension and no ascites. There is no hepatosplenomegaly. There is no tenderness.  Musculoskeletal: Normal range of motion. He exhibits no edema or tenderness.       Right shoulder: Normal.       Left shoulder: Normal.       Right hip: Normal.       Left hip: Normal.       Right knee: Normal.       Left knee: Normal.  Lymphadenopathy:       Head (right side): No submandibular, no preauricular and no  posterior auricular adenopathy present.       Head (left side): No submandibular, no preauricular and no posterior auricular adenopathy present.    He has no cervical adenopathy.       Right cervical: No superficial cervical and no deep cervical adenopathy present.      Left cervical: No superficial cervical and no deep cervical adenopathy present.  Neurological: He is alert and oriented to person, place, and time. He has normal strength. No sensory deficit. Coordination and gait normal.  Skin: Skin is warm, dry and intact. No abrasion, no bruising, no ecchymosis, no lesion and no rash noted. He is not diaphoretic. No cyanosis or erythema. No pallor. Nails show no clubbing.  Psychiatric: He has a normal mood and affect. His speech is normal and behavior is normal. Judgment and thought content normal. Cognition and memory are normal. He is attentive.   Wound not examined Lab Results Lab Results  Component Value Date   WBC 13.7 (H) 09/22/2018   HGB 16.8 09/22/2018   HCT 50.3 09/22/2018   MCV 96.5 09/22/2018   PLT 323 09/22/2018    Lab Results  Component Value Date   CREATININE 1.36 (H) 09/22/2018   BUN 14 09/22/2018   NA 134 (L) 09/22/2018   K 3.8 09/22/2018   CL 95 (L) 09/22/2018   CO2 30 09/22/2018    Lab Results  Component Value Date   ALT 17 09/22/2018   AST 15 09/22/2018   ALKPHOS 48 09/22/2018   BILITOT 0.8 09/22/2018     Microbiology: Recent Results (from the past 240 hour(s))  Aerobic/Anaerobic Culture (surgical/deep wound)     Status: None   Collection Time: 09/13/18  4:08 PM  Result Value Ref Range Status   Specimen Description ABSCESS BUTTOCK LEFT  Final   Special Requests NONE  Final   Gram Stain   Final    ABUNDANT WBC PRESENT, PREDOMINANTLY PMN NO ORGANISMS SEEN    Culture   Final    ABUNDANT MYCOBACTERIUM FORTUITUM NO ANAEROBES ISOLATED Performed at [facility], [place] [address] [place], [place] [address]    Report Status 09/19/2018 FINAL  Final    Aerobic/Anaerobic Culture (surgical/deep wound)     Status: None (Preliminary result)   Collection Time: 09/22/18  2:31 PM  Result Value Ref Range Status   Specimen Description   Final    ABSCESS LEFT GLUTEAL Performed at [place] [address] [place], [place] [address]    Special Requests   Final    NONE Performed at [facility], [address] [place], [place] [address]    Gram Stain   Final    MODERATE WBC PRESENT,BOTH PMN AND MONONUCLEAR NO ORGANISMS SEEN Performed at [facility], [place] [address], [place], [place] [address]    Culture PENDING  Incomplete   Report Status PENDING  Incomplete    Alcide Evener, [facility] [phone] pager  09/22/2018, 6:24 PM

## 2018-09-22 NOTE — ED Notes (Signed)
I have just called report to [place], RN on [address]. Will transport shortly. As I write this, the hospitalist is seeing him.

## 2018-09-22 NOTE — ED Notes (Signed)
He maintains his position of comfort (H.O.B. ~ 20 degrees and is watching a movie on his laptop. He has no requests at this time.

## 2018-09-22 NOTE — H&P (Signed)
[facility] History and Physical  Justin Baldwin LHT:[id number] DOB: June 06, 1976 DOA: 09/22/2018  Referring physician: Dr. Milton Ferguson PCP: Pleas [name], NP   Chief Complaint: abscess  HPI: Justin Baldwin is a 42 y.o. male with medical history significant for Hidradenitis (previously on Humira) who presents on 09/22/2018 with several weeks of painful mass on left buttocks found to be left buttocks abscess on 11/7 on bactrim x 21 days with failed outpatient treatment with persistent fevers and pain.     He reports 2 months ago hitting himself with a dumbbell where he noticed a lump. It was initially thought to be a hematoma/deep tissue injury. There was then a concern for infection because it didn't go down in size.  11/7 evaluated by PCP who obtain CT abdomen which showed superficial soft tissue abscess of left buttocks with adjacent cellulitis  prompting initiation of bactrim x 2weeks .  Of note prior to that, he was on doxycycline x 14 days prescribed at an urgent care visit for an "inflamed ankle"  He was then evaluated by [facility] surgery on 11/8 where he was recommended to continue Bactrim for additional 2 weeks.  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  Given the growth of his right organism and persistent fevers surgery and outpatient referral to ID was obtained. Marland Kitchen  He reports having a fever for "19 straight days" its usually 100.5 with a max of 104, he'll have subjective chills and sweats. No drainage from the area.    He was directed to ED because of ongoing pain, fever, and concern for worsening infection.   Hidradenitis. Stopped Humira (was on for 3 years) more than 2 months ago due to concern for potential allergic reaction.  Usually he has lesions in his chest and back but has noticed any active lesions since discontinuing   ED Course: T-max 99.1, respiratory rate 16, normal oxygen saturation,  pulse 78, blood pressure range 133/76-159/97 WBC 13.7 Glucose 119 Creatinine 1.36 Blood cultures x2 were obtained CT abdomen showed progression of left buttocks inflammatory changes, likely abscess abutting the gluteus maximus muscle with concern for early muscle invasion, and adjacent panniculitis and cellulitis. ED physician consulted ID, surgery. [facility] was called for further management.   Review of Systems:  Constitutional:  No weight loss, night sweats, Fevers, chills, fatigue.  HEENT:  No headaches, Difficulty swallowing,Tooth/dental problems,Sore throat,  No sneezing, itching, ear ache, nasal congestion, post nasal drip,  Cardio-vascular:  No chest pain, Orthopnea, PND, swelling in lower extremities, anasarca, dizziness, palpitations  GI:  No heartburn, indigestion, abdominal pain, nausea, vomiting, diarrhea, change in bowel habits, loss of appetite  Resp:  No shortness of breath with exertion or at rest. No excess mucus, no productive cough, No non-productive cough, No coughing up of blood.No change in color of mucus.No wheezing.No chest wall deformity  Skin:  no rash or lesions.  GU:  no dysuria, change in color of urine, no urgency or frequency. No flank pain.  Musculoskeletal:  No joint pain or swelling. No decreased range of motion. No back pain.  Psych:  No change in mood or affect. No depression or anxiety. No memory loss.   Past Medical History:  Diagnosis Date  . Erectile dysfunction   . Insomnia   . Skin abnormality    hidradenitis suppurativa    History reviewed. No pertinent surgical history. Social History:  reports that he has been smoking cigarettes. He has  been smoking about 1.00 pack per day. He quit smokeless tobacco use about 2 years ago. He reports that he drinks alcohol. He reports that he does not use drugs.  No Known Allergies  Family History  Problem Relation Age of Onset  . Heart attack Father   . Emphysema Paternal Grandmother    . Arthritis Mother       Prior to Admission medications   Medication Sig Start Date End Date Taking? Authorizing Provider  acetaminophen (TYLENOL) 325 MG tablet Take 650 mg by mouth every 6 (six) hours as needed for mild pain or headache.   Yes [provider]  oxymetazoline (AFRIN) 0.05 % nasal spray Place 1-2 sprays into both nostrils as needed for congestion.    Yes [provider]  sulfamethoxazole-trimethoprim (BACTRIM DS,SEPTRA DS) 800-160 MG tablet Take 1 tablet by mouth 2 (two) times daily. 08/31/18   Abner Greenspan, MD   Physical Exam: Vitals:   09/22/18 0806 09/22/18 0807 09/22/18 0926  BP: (!) 159/97  133/76  Pulse: 78  (!) 59  Resp: 18  16  Temp: 98.7 F (37.1 C)  99.1 F (37.3 C)  TempSrc: Oral  Oral  SpO2: 99%  98%  Weight:  106.6 kg   Height:  6' (1.829 m)     Wt Readings from Last 3 Encounters:  09/22/18 106.6 kg  08/25/18 107 kg  11/08/17 105.2 kg    Constitutional well-built middle-aged male, no distress Eyes: EOMI, anicteric, normal conjunctivae ENMT: Oropharynx with moist mucous membranes, normal dentition Neck: FROM,  Cardiovascular: RRR no MRGs, with no peripheral edema Respiratory: Normal respiratory effort, clear breath sounds  Abdomen: Soft,non-tender,  Musculoskeletal: no clubbing / cyanosis. No joint deformity upper and lower extremities. Good ROM, no contractures. Normal muscle tone. Skin: Warm, raised area of left buttocks, with obvious surrounding erythema, no open drainage.  Scattered pustules on left posterior shoulder Neurologic: Grossly no focal neuro deficit. Psychiatric:Appropriate affect, and mood. Mental status AAOx3          Labs on Admission:  Basic Metabolic Panel: Recent Labs  Lab 09/22/18 0828  NA 134*  K 3.8  CL 95*  CO2 30  GLUCOSE 119*  BUN 14  CREATININE 1.36*  CALCIUM 9.2   Liver Function Tests: Recent Labs  Lab 09/22/18 0828  AST 15  ALT 17  ALKPHOS 48  BILITOT 0.8  PROT 9.3*    ALBUMIN 4.3   No results for input(s): LIPASE, AMYLASE in the last 168 hours. No results for input(s): AMMONIA in the last 168 hours. CBC: Recent Labs  Lab 09/22/18 0828  WBC 13.7*  NEUTROABS 8.5*  HGB 16.8  HCT 50.3  MCV 96.5  PLT 323   Cardiac Enzymes: No results for input(s): CKTOTAL, CKMB, CKMBINDEX, TROPONINI in the last 168 hours.  BNP (last 3 results) No results for input(s): BNP in the last 8760 hours.  ProBNP (last 3 results) No results for input(s): PROBNP in the last 8760 hours.  CBG: No results for input(s): GLUCAP in the last 168 hours.  Radiological Exams on Admission: Ct Abdomen Pelvis W Contrast  Result Date: 09/22/2018 CLINICAL DATA:  Left lower abdominal/pelvic pain EXAM: CT ABDOMEN AND PELVIS WITH CONTRAST TECHNIQUE: Multidetector CT imaging of the abdomen and pelvis was performed using the standard protocol following bolus administration of intravenous contrast. CONTRAST:  175mL ISOVUE-300 IOPAMIDOL (ISOVUE-300) INJECTION 61% COMPARISON:  August 31, 2018 FINDINGS: Lower chest: Lung bases are clear. Hepatobiliary: No focal liver lesions are appreciable. Gallbladder  wall is not appreciably thickened. There is no biliary duct dilatation. Pancreas: No pancreatic mass or inflammatory focus. Spleen: No splenic lesions are evident. Adrenals/Urinary Tract: Adrenals bilaterally appear unremarkable. There is again noted a wedge-shaped area of decreased attenuation in the lateral mid left kidney, unchanged from recent CT. No well-defined mass seen in either kidney. No hydronephrosis evident on either side. No renal or ureteral calculus evident on either side. Urinary bladder is midline with wall thickness within normal limits. Stomach/Bowel: There are occasional sigmoid diverticula without diverticulitis. There is no appreciable bowel wall or mesenteric thickening. No bowel obstruction. No free air or portal venous air. Vascular/Lymphatic: No abdominal aortic aneurysm. No  appreciable vascular lesions evident. No adenopathy appreciable in the abdomen or pelvis. Reproductive: Prostate and seminal vesicles are normal in size and contour. There is no evident pelvic mass. Other: Appendix appears unremarkable. No abscess or ascites is noted within the peritoneum or retroperitoneal regions. There is a rather minimal ventral hernia containing only fat. Musculoskeletal: There is a loculated abscess in the left buttocks region which abuts the gluteus maximus muscle in its mid to lateral aspects. This abscess currently measures 11.1 x 3.7 x 14.5 cm. There is overlying cellulitis and adjacent panniculitis in the fat between the skin surface and in the gluteus maximus muscle. There is milder edema in the soft tissues on the right in the buttocks region adjacent to the gluteus maximus muscle without well-defined fluid. No abscess is seen in this area. There are foci of degenerative change in the lumbar spine. There are no blastic or lytic bone lesions. IMPRESSION: 1. Progression of inflammatory change in the left buttocks region compared to prior CT. There is a loculated abscess in this area currently measuring 11.1 x 3.7 x 14.5 cm. Specifically, this loculated abscess abuts the gluteus maximus muscle posterolaterally. There are areas which suggest early muscle invasion from this loculated abscess. There is adjacent panniculitis and cellulitis posterolaterally in the left lower pelvic/upper thigh regions. 2. On the right in the posterior buttocks region adjacent to the gluteus maximus muscle, there is soft tissue thickening but no frank abscess or fluid. 3. A small wedge-shaped area of decreased attenuation in the mid left kidney laterally is again noted, stable, a finding that could represent localized lobar nephronia/acute bacterial nephritis. No surrounding perinephric stranding or fluid. No hydronephrosis on either side. No renal or ureteral calculus on either side. 4. Occasional sigmoid  diverticula without diverticulitis. No bowel obstruction. No abscess within the peritoneum or retroperitoneum. Appendix appears normal. Electronically Signed   By: Lowella Grip III M.D.   On: 09/22/2018 10:02      Assessment/Plan Active Problems:   Abscess   Abscess of buttock, left   Hydradenitis   Kidney insufficiency   1. 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  2. Hydradenitis, stable Small pustules on left posterior shoulder otherwise no active lesions Previously on Humira injections  3.  Kidney insufficiency ?  Acuity Previous creatinines in system seem elevated to 1.4  with GFR less than 60 - Give time fluids in case this represents a prerenal etiology in the setting of known infection -Avoid nephrotoxins, monitor output   Consultants: ID, Surgery (consulted by EDP) Code Status: Full code DVT Prophylaxis: Lovenox Family Communication: No family at bedside Disposition Plan: Admitted as inpatient to [facility] unit, anticipate greater than 2 midnight stay given left loculated abscess with need for operative I&D, ID consultation to direct potential antibiotic selection  Time spent: Less than 25 minutes  Desiree Hane MD [facility]  Pager [phone]  If 7PM-7AM, please contact night-coverage [email or web address] Password [facility]  09/22/2018, 12:55 PM

## 2018-09-22 NOTE — Anesthesia Procedure Notes (Addendum)
Procedure Name: Intubation Date/Time: 09/22/2018 2:12 PM Performed by: Lissa Morales, CRNA Pre-anesthesia Checklist: Patient identified, Emergency Drugs available, Suction available and Patient being monitored Patient Re-evaluated:Patient Re-evaluated prior to induction Oxygen Delivery Method: Circle system utilized Preoxygenation: Pre-oxygenation with 100% oxygen Induction Type: IV induction Laryngoscope Size: Mac and 4 Grade View: Grade II Tube type: Oral Tube size: 7.5 mm Number of attempts: 1 Airway Equipment and Method: Stylet and Oral airway (modified rapid sequence) Placement Confirmation: ETT inserted through vocal cords under direct vision,  positive ETCO2 and breath sounds checked- equal and bilateral Secured at: 21 cm Tube secured with: Tape Dental Injury: Teeth and Oropharynx as per pre-operative assessment

## 2018-09-22 NOTE — ED Triage Notes (Signed)
Patient reports having an abscess on the left hip x 2 months and fever x 19 days. Patient reports that at 0300 today his fever was 104.0 orally and he took Tylenol at this time.

## 2018-09-22 NOTE — ED Notes (Signed)
ED TO INPATIENT HANDOFF REPORT  Name/Age/Gender Justin Baldwin 42 y.o. male  Code Status   Home/SNF/Other Home  Chief Complaint hip pain; fever  Level of Care/Admitting Diagnosis ED Disposition    ED Disposition Condition [facility] Area: [place] [[id number]]  Level of Care: Med-Surg [16]  Diagnosis: Abscess of buttock, left [419622]  Admitting Physician: Desiree Hane [[id number]]  Attending Physician: Desiree Hane [[id number]]  Estimated length of stay: past midnight tomorrow  Certification:: I certify this patient will need inpatient services for at least 2 midnights  PT Class (Do Not Modify): Inpatient [101]  PT Acc Code (Do Not Modify): Private [1]       Medical History Past Medical History:  Diagnosis Date  . Erectile dysfunction   . Insomnia   . Skin abnormality    hidradenitis suppurativa     Allergies No Known Allergies  IV Location/Drains/Wounds Patient Lines/Drains/Airways Status   Active Line/Drains/Airways    Name:   Placement date:   Placement time:   Site:   Days:   Peripheral IV 09/22/18 Left Antecubital   09/22/18    0824    Antecubital   less than 1          Labs/Imaging Results for orders placed or performed during the hospital encounter of 09/22/18 (from the past 48 hour(s))  CBC with Differential/Platelet     Status: Abnormal   Collection Time: 09/22/18  8:28 AM  Result Value Ref Range   WBC 13.7 (H) 4.0 - 10.5 K/uL   RBC 5.21 4.22 - 5.81 MIL/uL   Hemoglobin 16.8 13.0 - 17.0 g/dL   HCT 50.3 39.0 - 52.0 %   MCV 96.5 80.0 - 100.0 fL   MCH 32.2 26.0 - 34.0 pg   MCHC 33.4 30.0 - 36.0 g/dL   RDW 11.8 11.5 - 15.5 %   Platelets 323 150 - 400 K/uL   nRBC 0.0 0.0 - 0.2 %   Neutrophils Relative % 63 %   Neutro Abs 8.5 (H) 1.7 - 7.7 K/uL   Lymphocytes Relative 28 %   Lymphs Abs 3.9 0.7 - 4.0 K/uL   Monocytes Relative 9 %   Monocytes Absolute 1.2 (H) 0.1 - 1.0 K/uL   Eosinophils Relative 0 %   Eosinophils Absolute 0.0 0.0 - 0.5 K/uL   Basophils Relative 0 %   Basophils Absolute 0.1 0.0 - 0.1 K/uL   Immature Granulocytes 0 %   Abs Immature Granulocytes 0.06 0.00 - 0.07 K/uL    Comment: Performed at [facility], [place] [address], [place], [place] [address]  Comprehensive metabolic panel     Status: Abnormal   Collection Time: 09/22/18  8:28 AM  Result Value Ref Range   Sodium 134 (L) 135 - 145 mmol/L   Potassium 3.8 3.5 - 5.1 mmol/L   Chloride 95 (L) 98 - 111 mmol/L   CO2 30 22 - 32 mmol/L   Glucose, Bld 119 (H) 70 - 99 mg/dL   BUN 14 6 - 20 mg/dL   Creatinine, Ser 1.36 (H) 0.61 - 1.24 mg/dL   Calcium 9.2 8.9 - 10.3 mg/dL   Total Protein 9.3 (H) 6.5 - 8.1 g/dL   Albumin 4.3 3.5 - 5.0 g/dL   AST 15 15 - 41 U/L   ALT 17 0 - 44 U/L   Alkaline Phosphatase 48 38 - 126 U/L   Total Bilirubin 0.8 0.3 - 1.2 mg/dL   GFR calc  non Af Amer >60 >60 mL/min   GFR calc Af Amer >60 >60 mL/min   Anion gap 9 5 - 15    Comment: Performed at [facility], [place] [address], [place], [place] [address]   Ct Abdomen Pelvis W Contrast  Result Date: 09/22/2018 CLINICAL DATA:  Left lower abdominal/pelvic pain EXAM: CT ABDOMEN AND PELVIS WITH CONTRAST TECHNIQUE: Multidetector CT imaging of the abdomen and pelvis was performed using the standard protocol following bolus administration of intravenous contrast. CONTRAST:  129mL ISOVUE-300 IOPAMIDOL (ISOVUE-300) INJECTION 61% COMPARISON:  August 31, 2018 FINDINGS: Lower chest: Lung bases are clear. Hepatobiliary: No focal liver lesions are appreciable. Gallbladder wall is not appreciably thickened. There is no biliary duct dilatation. Pancreas: No pancreatic mass or inflammatory focus. Spleen: No splenic lesions are evident. Adrenals/Urinary Tract: Adrenals bilaterally appear unremarkable. There is again noted a wedge-shaped area of decreased attenuation in the lateral mid left kidney, unchanged from recent CT. No  well-defined mass seen in either kidney. No hydronephrosis evident on either side. No renal or ureteral calculus evident on either side. Urinary bladder is midline with wall thickness within normal limits. Stomach/Bowel: There are occasional sigmoid diverticula without diverticulitis. There is no appreciable bowel wall or mesenteric thickening. No bowel obstruction. No free air or portal venous air. Vascular/Lymphatic: No abdominal aortic aneurysm. No appreciable vascular lesions evident. No adenopathy appreciable in the abdomen or pelvis. Reproductive: Prostate and seminal vesicles are normal in size and contour. There is no evident pelvic mass. Other: Appendix appears unremarkable. No abscess or ascites is noted within the peritoneum or retroperitoneal regions. There is a rather minimal ventral hernia containing only fat. Musculoskeletal: There is a loculated abscess in the left buttocks region which abuts the gluteus maximus muscle in its mid to lateral aspects. This abscess currently measures 11.1 x 3.7 x 14.5 cm. There is overlying cellulitis and adjacent panniculitis in the fat between the skin surface and in the gluteus maximus muscle. There is milder edema in the soft tissues on the right in the buttocks region adjacent to the gluteus maximus muscle without well-defined fluid. No abscess is seen in this area. There are foci of degenerative change in the lumbar spine. There are no blastic or lytic bone lesions. IMPRESSION: 1. Progression of inflammatory change in the left buttocks region compared to prior CT. There is a loculated abscess in this area currently measuring 11.1 x 3.7 x 14.5 cm. Specifically, this loculated abscess abuts the gluteus maximus muscle posterolaterally. There are areas which suggest early muscle invasion from this loculated abscess. There is adjacent panniculitis and cellulitis posterolaterally in the left lower pelvic/upper thigh regions. 2. On the right in the posterior buttocks  region adjacent to the gluteus maximus muscle, there is soft tissue thickening but no frank abscess or fluid. 3. A small wedge-shaped area of decreased attenuation in the mid left kidney laterally is again noted, stable, a finding that could represent localized lobar nephronia/acute bacterial nephritis. No surrounding perinephric stranding or fluid. No hydronephrosis on either side. No renal or ureteral calculus on either side. 4. Occasional sigmoid diverticula without diverticulitis. No bowel obstruction. No abscess within the peritoneum or retroperitoneum. Appendix appears normal. Electronically Signed   By: Lowella Grip III M.D.   On: 09/22/2018 10:02   None  Pending Labs Unresulted Labs (From admission, onward)    Start     Ordered   09/22/18 0805  Blood culture (routine x 2)  BLOOD CULTURE X 2,   STAT  09/22/18 0804   Signed and Held  HIV antibody (Routine Testing)  Once,   R     Signed and Held   Signed and Held  Creatinine, serum  (enoxaparin (LOVENOX)    CrCl >/= 30 ml/min)  Weekly,   R    Comments:  while on enoxaparin therapy    Signed and Held   Signed and Held  Basic metabolic panel  Tomorrow morning,   R     Signed and Held   Signed and Held  CBC  Tomorrow morning,   R     Signed and Held          Vitals/Pain Today's Vitals   09/22/18 0806 09/22/18 0807 09/22/18 0926  BP: (!) 159/97  133/76  Pulse: 78  (!) 59  Resp: 18  16  Temp: 98.7 F (37.1 C)  99.1 F (37.3 C)  TempSrc: Oral  Oral  SpO2: 99%  98%  Weight:  106.6 kg   Height:  6' (1.829 m)   PainSc: 8       Isolation Precautions No active isolations  Medications Medications  iopamidol (ISOVUE-300) 61 % injection (has no administration in time range)  sodium chloride 0.9 % bolus 500 mL (500 mLs Intravenous New Bag/Given 09/22/18 0923)  iopamidol (ISOVUE-300) 61 % injection 100 mL (100 mLs Intravenous Contrast Given 09/22/18 0935)  HYDROmorphone (DILAUDID) injection 1 mg (1 mg Intravenous Given  09/22/18 1105)  ondansetron (ZOFRAN) injection 4 mg (4 mg Intravenous Given 09/22/18 1105)    Mobility walks

## 2018-09-22 NOTE — ED Notes (Signed)
Patient transported to CT 

## 2018-09-22 NOTE — Anesthesia Preprocedure Evaluation (Signed)
Anesthesia Evaluation  Patient identified by MRN, date of birth, ID band Patient awake    Reviewed: Allergy & Precautions, H&P , NPO status , Patient's Chart, lab work & pertinent test results, reviewed documented beta blocker date and time   Airway Mallampati: I  TM Distance: >3 FB Neck ROM: full    Dental no notable dental hx. (+) Teeth Intact   Pulmonary neg pulmonary ROS, Current Smoker,    Pulmonary exam normal breath sounds clear to auscultation       Cardiovascular Exercise Tolerance: Good negative cardio ROS Normal cardiovascular exam Rhythm:regular Rate:Normal     Neuro/Psych negative neurological ROS  negative psych ROS   GI/Hepatic negative GI ROS, Neg liver ROS,   Endo/Other  negative endocrine ROS  Renal/GU Renal disease  negative genitourinary   Musculoskeletal   Abdominal   Peds  Hematology negative hematology ROS (+)   Anesthesia Other Findings   Reproductive/Obstetrics negative OB ROS                             Anesthesia Physical Anesthesia Plan  ASA: II  Anesthesia Plan: General   Post-op Pain Management:    Induction: Intravenous  PONV Risk Score and Plan: 2 and Ondansetron, Dexamethasone and Treatment may vary due to age or medical condition  Airway Management Planned: Oral ETT and LMA  Additional Equipment:   Intra-op Plan:   Post-operative Plan: Extubation in OR  Informed Consent: I have reviewed the patients History and Physical, chart, labs and discussed the procedure including the risks, benefits and alternatives for the proposed anesthesia with the patient or authorized representative who has indicated his/her understanding and acceptance.   Dental Advisory Given  Plan Discussed with: CRNA, Anesthesiologist and Surgeon  Anesthesia Plan Comments: (  )        Anesthesia Quick Evaluation

## 2018-09-23 DIAGNOSIS — L0231 Cutaneous abscess of buttock: Principal | ICD-10-CM

## 2018-09-23 LAB — BASIC METABOLIC PANEL
Anion gap: 6 (ref 5–15)
BUN: 16 mg/dL (ref 6–20)
CO2: 28 mmol/L (ref 22–32)
Calcium: 8.1 mg/dL — ABNORMAL LOW (ref 8.9–10.3)
Chloride: 101 mmol/L (ref 98–111)
Creatinine, Ser: 1.26 mg/dL — ABNORMAL HIGH (ref 0.61–1.24)
GFR calc Af Amer: >60 mL/min (ref >60)
GFR calc non Af Amer: >60 mL/min (ref >60)
GLUCOSE: 132 mg/dL — AB (ref 70–99)
Potassium: 4.2 mmol/L (ref 3.5–5.1)
Sodium: 135 mmol/L (ref 135–145)

## 2018-09-23 LAB — HIV ANTIBODY (ROUTINE TESTING W REFLEX)
HIV Screen 4th Generation wRfx: NONREACTIVE
HIV Screen 4th Generation wRfx: NONREACTIVE

## 2018-09-23 LAB — CBC
HEMATOCRIT: 44.6 % (ref 39.0–52.0)
Hemoglobin: 14.4 g/dL (ref 13.0–17.0)
MCH: 31.4 pg (ref 26.0–34.0)
MCHC: 32.3 g/dL (ref 30.0–36.0)
MCV: 97.2 fL (ref 80.0–100.0)
Platelets: 293 10*3/uL (ref 150–400)
RBC: 4.59 MIL/uL (ref 4.22–5.81)
RDW: 11.9 % (ref 11.5–15.5)
WBC: 8.8 10*3/uL (ref 4.0–10.5)
nRBC: 0 % (ref 0.0–0.2)

## 2018-09-23 LAB — ACID FAST SMEAR (AFB, MYCOBACTERIA): Acid Fast Smear: NEGATIVE

## 2018-09-23 LAB — C-REACTIVE PROTEIN: CRP: 9.4 mg/dL — AB (ref <1.0)

## 2018-09-23 LAB — SEDIMENTATION RATE: Sed Rate: 55 mm/hr — ABNORMAL HIGH (ref 0–16)

## 2018-09-23 MED ORDER — SODIUM CHLORIDE 0.9 % IV BOLUS
500.0000 mL | Freq: Once | INTRAVENOUS | Status: AC
  Administered 2018-09-23: 500 mL via INTRAVENOUS

## 2018-09-23 MED ORDER — SULFAMETHOXAZOLE-TRIMETHOPRIM 800-160 MG PO TABS
1.0000 | ORAL_TABLET | Freq: Two times a day (BID) | ORAL | Status: DC
  Administered 2018-09-23 (×2): 1 via ORAL
  Filled 2018-09-23 (×3): qty 1

## 2018-09-23 NOTE — Progress Notes (Signed)
PROGRESS NOTE    Justin Baldwin  KKX:[id number] DOB: 11-04-1975 DOA: 09/22/2018 PCP: Pleas [name], NP   Brief Narrative:  HPI on 09/22/2018 by Dr. Oretha Milch Justin Baldwin is a 42 y.o. male with medical history significant for Hidradenitis (previously on Humira) who presents on 09/22/2018 with several weeks of painful mass on left buttocks found to be left buttocks abscess on 11/7 on bactrim x 21 days with failed outpatient treatment with persistent fevers and pain.   He reports 2 months ago hitting himself with a dumbbell where he noticed a lump. It was initially thought to be a hematoma/deep tissue injury. There was then a concern for infection because it didn't go down in size.  11/7 evaluated by PCP who obtain CT abdomen which showed superficial soft tissue abscess of left buttocks with adjacent cellulitis  prompting initiation of bactrim x 2weeks .  Of note prior to that, he was on doxycycline x 14 days prescribed at an urgent care visit for an "inflamed ankle"  He was then evaluated by [facility] surgery on 11/8 where he was recommended to continue Bactrim for additional 2 weeks.  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  Given the growth of his right organism and persistent fevers surgery and outpatient referral to ID was obtained. He reports having a fever for "19 straight days" its usually 100.5 with a max of 104, he'll have subjective chills and sweats. No drainage from the area.    He was directed to ED because of ongoing pain, fever, and concern for worsening infection.  Hidradenitis. Stopped Humira (was on for 3 years) more than 2 months ago due to concern for potential allergic reaction.  Usually he has lesions in his chest and back but has noticed any active lesions since discontinuing  Assessment & Plan   Left buttock loculated abscess with surrounding panniculitis and cellulitis -CT scan and pelvis  showed progression of inflammatory change in the left buttocks region compared to prior CT, loculated abscess 11.1 x 3.7 x 14.5 cm. 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  Recommending daily wound changes and feels patient may likely need several days before he is able to tolerate dressing changes at home -Infectious disease consulted and appreciated, recommending imipenem plus Levaquin and Bactrim.  Upon discharge, recommended Avelox, Bactrim, Zyvox -Blood cultures show no growth to date -Continue imipenem -Bactrim and Levaquin added today by infectious disease -Continue pain control  Hidradenitis -Stable, small pustules on left posterior shoulder otherwise no active lesions -Was on Humira injections  Chronic kidney disease, stage II -Creatinine currently 1.26, appears to be at baseline -Creatinine back in 2017 was 1.4  DVT Prophylaxis Lovenox  Code Status: Full  Family Communication: None at bedside  Disposition Plan: Admitted.  Suspect discharged home in a few days.  Consultants General surgery Infectious disease  Procedures  I&D of left buttock abscess  Antibiotics   Anti-infectives (From admission, onward)   Start     Dose/Rate Route Frequency Ordered Stop   09/23/18 1200  sulfamethoxazole-trimethoprim (BACTRIM DS,SEPTRA DS) 800-160 MG per tablet 1 tablet     1 tablet Oral Every 12 hours 09/23/18 1131     09/22/18 1800  imipenem-cilastatin (PRIMAXIN) 500 mg in sodium chloride 0.9 % 100 mL IVPB     500 mg 200 mL/hr over 30 Minutes Intravenous Every 6 hours 09/22/18 1702  09/22/18 1700  levofloxacin (LEVAQUIN) tablet 750 mg    Note to Pharmacy:  I would prefer AVELOX  But ok if cannot get it   750 mg Oral Daily 09/22/18 1640     09/22/18 1400  piperacillin-tazobactam (ZOSYN) IVPB 3.375 g     3.375 g 100 mL/hr over 30 Minutes Intravenous  Once 09/22/18 1355 09/22/18 1436    09/22/18 1309  piperacillin-tazobactam (ZOSYN) 3.375 GM/50ML IVPB    Note to Pharmacy:  [name]   : cabinet override      09/22/18 1309 09/23/18 0114      Subjective:   Justin Baldwin seen and examined today.  Has no complaints this morning.  Does complain of some pain on his left buttock when he lays on it.  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  Denies current chest pain, shortness of breath, abdominal pain, nausea vomiting, diarrhea or constipation.  Objective:   Vitals:   09/22/18 1640 09/22/18 1737 09/22/18 2204 09/23/18 0514  BP: 130/77 120/75 136/82 126/77  Pulse: 70 (!) 58 (!) 53 73  Resp: 15 17 17 17   Temp: 99 F (37.2 C)  99 F (37.2 C) 99.1 F (37.3 C)  TempSrc: Oral  Oral Oral  SpO2: 99% 94% 96% 98%  Weight:      Height:        Intake/Output Summary (Last 24 hours) at 09/23/2018 1503 Last data filed at 09/23/2018 1300 Gross per 24 hour  Intake 2626.38 ml  Output -  Net 2626.38 ml   Filed Weights   09/22/18 0807  Weight: 106.6 kg    Exam  General: Well developed, well nourished, NAD, appears stated age  HEENT: NCAT, mucous membranes moist.   Neck: Supple  Cardiovascular: S1 S2 auscultated, RRR, no murmur  Respiratory: Clear to auscultation bilaterally with equal chest rise  Abdomen: Soft, nontender, nondistended, + bowel sounds  Extremities: warm dry without cyanosis clubbing or edema  Neuro: AAOx3, nonfocal  Skin: Without rashes exudates or nodules, multiple tattoos.  Dressing on left buttock  Psych: Normal affect and demeanor with intact judgement and insight   Data Reviewed: I have personally reviewed following labs and imaging studies  CBC: Recent Labs  Lab 09/22/18 0828 09/23/18 0513  WBC 13.7* 8.8  NEUTROABS 8.5*  --   HGB 16.8 14.4  HCT 50.3 44.6  MCV 96.5 97.2  PLT 323 604   Basic Metabolic Panel: Recent Labs  Lab 09/22/18 0828 09/23/18 0513  NA 134* 135  K  3.8 4.2  CL 95* 101  CO2 30 28  GLUCOSE 119* 132*  BUN 14 16  CREATININE 1.36* 1.26*  CALCIUM 9.2 8.1*   GFR: Estimated Creatinine Clearance: 96.4 mL/min (A) (by C-G formula based on SCr of 1.26 mg/dL (H)). Liver Function Tests: Recent Labs  Lab 09/22/18 0828  AST 15  ALT 17  ALKPHOS 48  BILITOT 0.8  PROT 9.3*  ALBUMIN 4.3   No results for input(s): LIPASE, AMYLASE in the last 168 hours. No results for input(s): AMMONIA in the last 168 hours. Coagulation Profile: No results for input(s): INR, PROTIME in the last 168 hours. Cardiac Enzymes: No results for input(s): CKTOTAL, CKMB, CKMBINDEX, TROPONINI in the last 168 hours. BNP (last 3 results) No results for input(s): PROBNP in the last 8760 hours. HbA1C: No results for input(s): HGBA1C in the last 72 hours. CBG: No results for input(s): GLUCAP  in the last 168 hours. Lipid Profile: No results for input(s): CHOL, HDL, LDLCALC, TRIG, CHOLHDL, LDLDIRECT in the last 72 hours. Thyroid Function Tests: No results for input(s): TSH, T4TOTAL, FREET4, T3FREE, THYROIDAB in the last 72 hours. Anemia Panel: No results for input(s): VITAMINB12, FOLATE, FERRITIN, TIBC, IRON, RETICCTPCT in the last 72 hours. Urine analysis: No results found for: COLORURINE, APPEARANCEUR, [place], [place], GLUCOSEU, [place], BILIRUBINUR, [place], [place], UROBILINOGEN, NITRITE, LEUKOCYTESUR Sepsis Labs: @LABRCNTIP (procalcitonin:4,lacticidven:4)  ) Recent Results (from the past 240 hour(s))  Aerobic/Anaerobic Culture (surgical/deep wound)     Status: None   Collection Time: 09/13/18  4:08 PM  Result Value Ref Range Status   Specimen Description ABSCESS BUTTOCK LEFT  Final   Special Requests NONE  Final   Gram Stain   Final    ABUNDANT WBC PRESENT, PREDOMINANTLY PMN NO ORGANISMS SEEN    Culture   Final    ABUNDANT MYCOBACTERIUM FORTUITUM NO ANAEROBES ISOLATED Performed at [facility], [address], [place], [place] [address]     Report Status 09/19/2018 FINAL  Final  Blood culture (routine x 2)     Status: None (Preliminary result)   Collection Time: 09/22/18  8:28 AM  Result Value Ref Range Status   Specimen Description   Final    BLOOD LEFT ANTECUBITAL Performed at [facility] [address], [place], [place] [address]    Special Requests   Final    BOTTLES DRAWN AEROBIC AND ANAEROBIC Blood Culture adequate volume Performed at [facility] [address] [place], [place] [address]    Culture   Final    NO GROWTH 1 DAY Performed at [facility], [place] [address], [place], [place] [address]    Report Status PENDING  Incomplete  Blood culture (routine x 2)     Status: None (Preliminary result)   Collection Time: 09/22/18  8:56 AM  Result Value Ref Range Status   Specimen Description   Final    BLOOD RIGHT ANTECUBITAL Performed at [facility] [address], [place], [place] [address]    Special Requests   Final    BOTTLES DRAWN AEROBIC AND ANAEROBIC Blood Culture results may not be optimal due to an excessive volume of blood received in culture bottles Performed at [facility] [address] [place], [place] [address]    Culture   Final    NO GROWTH 1 DAY Performed at [facility], [place] [address] [place], [place] [address]    Report Status PENDING  Incomplete  Aerobic/Anaerobic Culture (surgical/deep wound)     Status: None (Preliminary result)   Collection Time: 09/22/18  2:31 PM  Result Value Ref Range Status   Specimen Description   Final    ABSCESS LEFT GLUTEAL Performed at [facility] [address] [place], [place] [address]    Special Requests   Final    NONE Performed at [facility], [place] [address] [place], [place] [address]    Gram Stain   Final    MODERATE WBC PRESENT,BOTH PMN AND MONONUCLEAR NO ORGANISMS SEEN    Culture   Final    NO GROWTH < 24  HOURS Performed at [facility], [place] [address], [place], [place] [address]    Report Status PENDING  Incomplete  Acid Fast Smear (AFB)     Status: None   Collection Time: 09/22/18  2:31 PM  Result Value Ref  Range Status   AFB Specimen Processing Concentration  Final   Acid Fast Smear Negative  Final    Comment: (NOTE) Performed At: [facility] [place], [place] [id number] Rush Farmer MD [id number]    Source (AFB) ABSCESS  Final    Comment: LEFT GLUTEAL Performed at [facility] [address], [place], [place] [address]       Radiology Studies: Ct Abdomen Pelvis W Contrast  Result Date: 09/22/2018 CLINICAL DATA:  Left lower abdominal/pelvic pain EXAM: CT ABDOMEN AND PELVIS WITH CONTRAST TECHNIQUE: Multidetector CT imaging of the abdomen and pelvis was performed using the standard protocol following bolus administration of intravenous contrast. CONTRAST:  164mL ISOVUE-300 IOPAMIDOL (ISOVUE-300) INJECTION 61% COMPARISON:  August 31, 2018 FINDINGS: Lower chest: Lung bases are clear. Hepatobiliary: No focal liver lesions are appreciable. Gallbladder wall is not appreciably thickened. There is no biliary duct dilatation. Pancreas: No pancreatic mass or inflammatory focus. Spleen: No splenic lesions are evident. Adrenals/Urinary Tract: Adrenals bilaterally appear unremarkable. There is again noted a wedge-shaped area of decreased attenuation in the lateral mid left kidney, unchanged from recent CT. No well-defined mass seen in either kidney. No hydronephrosis evident on either side. No renal or ureteral calculus evident on either side. Urinary bladder is midline with wall thickness within normal limits. Stomach/Bowel: There are occasional sigmoid diverticula without diverticulitis. There is no appreciable bowel wall or mesenteric thickening. No bowel obstruction. No free air or portal venous air. Vascular/Lymphatic: No abdominal aortic  aneurysm. No appreciable vascular lesions evident. No adenopathy appreciable in the abdomen or pelvis. Reproductive: Prostate and seminal vesicles are normal in size and contour. There is no evident pelvic mass. Other: Appendix appears unremarkable. No abscess or ascites is noted within the peritoneum or retroperitoneal regions. There is a rather minimal ventral hernia containing only fat. Musculoskeletal: There is a loculated abscess in the left buttocks region which abuts the gluteus maximus muscle in its mid to lateral aspects. This abscess currently measures 11.1 x 3.7 x 14.5 cm. There is overlying cellulitis and adjacent panniculitis in the fat between the skin surface and in the gluteus maximus muscle. There is milder edema in the soft tissues on the right in the buttocks region adjacent to the gluteus maximus muscle without well-defined fluid. No abscess is seen in this area. There are foci of degenerative change in the lumbar spine. There are no blastic or lytic bone lesions. IMPRESSION: 1. Progression of inflammatory change in the left buttocks region compared to prior CT. There is a loculated abscess in this area currently measuring 11.1 x 3.7 x 14.5 cm. Specifically, this loculated abscess abuts the gluteus maximus muscle posterolaterally. There are areas which suggest early muscle invasion from this loculated abscess. There is adjacent panniculitis and cellulitis posterolaterally in the left lower pelvic/upper thigh regions. 2. On the right in the posterior buttocks region adjacent to the gluteus maximus muscle, there is soft tissue thickening but no frank abscess or fluid. 3. A small wedge-shaped area of decreased attenuation in the mid left kidney laterally is again noted, stable, a finding that could represent localized lobar nephronia/acute bacterial nephritis. No surrounding perinephric stranding or fluid. No hydronephrosis on either side. No renal or ureteral calculus on either side. 4. Occasional  sigmoid diverticula without diverticulitis. No bowel obstruction. No abscess within the peritoneum or retroperitoneum. Appendix appears normal. Electronically Signed   By: Lowella Grip III M.D.   On: 09/22/2018 10:02     Scheduled Meds: .  enoxaparin (LOVENOX) injection  40 mg Subcutaneous Q24H  . levofloxacin  750 mg Oral Daily  . nicotine  7 mg Transdermal Daily  . sulfamethoxazole-trimethoprim  1 tablet Oral Q12H   Continuous Infusions: . dextrose 5 % and 0.9% NaCl 100 mL/hr at 09/23/18 0512  . imipenem-cilastatin 500 mg (09/23/18 1311)     LOS: 1 day   Time Spent in minutes   30 minutes  Ashlin Kreps D.O. on 09/23/2018 at 3:03 PM  Between 7am to 7pm - Please see pager noted on [email or web address]  After 7pm go to [email or web address]  And look for the night coverage person covering for me after hours  [facility] Office  [phone]

## 2018-09-23 NOTE — Progress Notes (Signed)
1 Day Post-Op I&D Subjective: Pt did well overnight  Objective: Vital signs in last 24 hours: Temp:  [98.7 F (37.1 C)-99.7 F (37.6 C)] 99.1 F (37.3 C) (11/30 0514) Pulse Rate:  [53-84] 73 (11/30 0514) Resp:  [10-18] 17 (11/30 0514) BP: (117-163)/(69-97) 126/77 (11/30 0514) SpO2:  [93 %-100 %] 98 % (11/30 0514) Weight:  [106.6 kg] 106.6 kg (11/29 0807)   Intake/Output from previous day: 11/29 0701 - 11/30 0700 In: 2146.4 [P.O.:340; I.V.:1606.4; IV Piggyback:200] Out: 50 [Blood:50] Intake/Output this shift: No intake/output data recorded.   General appearance: alert and cooperative  Incision: packed  Lab Results:  Recent Labs    09/22/18 0828 09/23/18 0513  WBC 13.7* 8.8  HGB 16.8 14.4  HCT 50.3 44.6  PLT 323 293   BMET Recent Labs    09/22/18 0828 09/23/18 0513  NA 134* 135  K 3.8 4.2  CL 95* 101  CO2 30 28  GLUCOSE 119* 132*  BUN 14 16  CREATININE 1.36* 1.26*  CALCIUM 9.2 8.1*   PT/INR No results for input(s): LABPROT, INR in the last 72 hours. ABG No results for input(s): PHART, HCO3 in the last 72 hours.  Invalid input(s): PCO2, PO2  MEDS, Scheduled . enoxaparin (LOVENOX) injection  40 mg Subcutaneous Q24H  . levofloxacin  750 mg Oral Daily  . nicotine  7 mg Transdermal Daily    Studies/Results: Ct Abdomen Pelvis W Contrast  Result Date: 09/22/2018 CLINICAL DATA:  Left lower abdominal/pelvic pain EXAM: CT ABDOMEN AND PELVIS WITH CONTRAST TECHNIQUE: Multidetector CT imaging of the abdomen and pelvis was performed using the standard protocol following bolus administration of intravenous contrast. CONTRAST:  183mL ISOVUE-300 IOPAMIDOL (ISOVUE-300) INJECTION 61% COMPARISON:  August 31, 2018 FINDINGS: Lower chest: Lung bases are clear. Hepatobiliary: No focal liver lesions are appreciable. Gallbladder wall is not appreciably thickened. There is no biliary duct dilatation. Pancreas: No pancreatic mass or inflammatory focus. Spleen: No splenic  lesions are evident. Adrenals/Urinary Tract: Adrenals bilaterally appear unremarkable. There is again noted a wedge-shaped area of decreased attenuation in the lateral mid left kidney, unchanged from recent CT. No well-defined mass seen in either kidney. No hydronephrosis evident on either side. No renal or ureteral calculus evident on either side. Urinary bladder is midline with wall thickness within normal limits. Stomach/Bowel: There are occasional sigmoid diverticula without diverticulitis. There is no appreciable bowel wall or mesenteric thickening. No bowel obstruction. No free air or portal venous air. Vascular/Lymphatic: No abdominal aortic aneurysm. No appreciable vascular lesions evident. No adenopathy appreciable in the abdomen or pelvis. Reproductive: Prostate and seminal vesicles are normal in size and contour. There is no evident pelvic mass. Other: Appendix appears unremarkable. No abscess or ascites is noted within the peritoneum or retroperitoneal regions. There is a rather minimal ventral hernia containing only fat. Musculoskeletal: There is a loculated abscess in the left buttocks region which abuts the gluteus maximus muscle in its mid to lateral aspects. This abscess currently measures 11.1 x 3.7 x 14.5 cm. There is overlying cellulitis and adjacent panniculitis in the fat between the skin surface and in the gluteus maximus muscle. There is milder edema in the soft tissues on the right in the buttocks region adjacent to the gluteus maximus muscle without well-defined fluid. No abscess is seen in this area. There are foci of degenerative change in the lumbar spine. There are no blastic or lytic bone lesions. IMPRESSION: 1. Progression of inflammatory change in the left buttocks region compared to prior CT.  There is a loculated abscess in this area currently measuring 11.1 x 3.7 x 14.5 cm. Specifically, this loculated abscess abuts the gluteus maximus muscle posterolaterally. There are areas which  suggest early muscle invasion from this loculated abscess. There is adjacent panniculitis and cellulitis posterolaterally in the left lower pelvic/upper thigh regions. 2. On the right in the posterior buttocks region adjacent to the gluteus maximus muscle, there is soft tissue thickening but no frank abscess or fluid. 3. A small wedge-shaped area of decreased attenuation in the mid left kidney laterally is again noted, stable, a finding that could represent localized lobar nephronia/acute bacterial nephritis. No surrounding perinephric stranding or fluid. No hydronephrosis on either side. No renal or ureteral calculus on either side. 4. Occasional sigmoid diverticula without diverticulitis. No bowel obstruction. No abscess within the peritoneum or retroperitoneum. Appendix appears normal. Electronically Signed   By: Lowella Grip III M.D.   On: 09/22/2018 10:02    Assessment: s/p Procedure(s): IRRIGATION AND DEBRIDEMENT ABSCESS Patient Active Problem List   Diagnosis Date Noted  . Abscess 09/22/2018  . Abscess of buttock, left 09/22/2018  . Hydradenitis 09/22/2018  . Kidney insufficiency 09/22/2018  . Lump 08/31/2018  . Deep tissue injury 08/25/2018  . Skin rash 07/25/2018  . Fatigue 11/08/2017  . Vertigo 07/11/2017  . Preventative health care 03/29/2016  . Insomnia 03/15/2016  . Erectile dysfunction 03/15/2016      Plan: start dressing changes today.  Pt will probably need several days before he will be able to tolerate dressing changes at home Abx per ID   LOS: 1 day     .Rosario Adie, [facility], [place]   09/23/2018 7:42 AM

## 2018-09-23 NOTE — Anesthesia Postprocedure Evaluation (Signed)
Anesthesia Post Note  Patient: STEPHANE NIEMANN  Procedure(s) Performed: IRRIGATION AND DEBRIDEMENT ABSCESS (N/A Buttocks)     Patient location during evaluation: PACU Anesthesia Type: General Level of consciousness: awake and alert Pain management: pain level controlled Vital Signs Assessment: post-procedure vital signs reviewed and stable Respiratory status: spontaneous breathing, nonlabored ventilation, respiratory function stable and patient connected to nasal cannula oxygen Cardiovascular status: blood pressure returned to baseline and stable Postop Assessment: no apparent nausea or vomiting Anesthetic complications: no    Last Vitals:  Vitals:   09/23/18 2038 09/23/18 2201  BP: 135/67   Pulse: 76   Resp: 18   Temp: (!) 39.2 C 37.6 C  SpO2: 96%     Last Pain:  Vitals:   09/23/18 2242  TempSrc:   PainSc: 6                  Jaquesha Boroff

## 2018-09-23 NOTE — Progress Notes (Signed)
Pt's temp elevated 103.3. Message sent to Dr. Marcello Moores to notify of results

## 2018-09-23 NOTE — Progress Notes (Addendum)
Subjective: No new complaints   Antibiotics:  Anti-infectives (From admission, onward)   Start     Dose/Rate Route Frequency Ordered Stop   09/23/18 1200  sulfamethoxazole-trimethoprim (BACTRIM DS,SEPTRA DS) 800-160 MG per tablet 1 tablet     1 tablet Oral Every 12 hours 09/23/18 1131     09/22/18 1800  imipenem-cilastatin (PRIMAXIN) 500 mg in sodium chloride 0.9 % 100 mL IVPB     500 mg 200 mL/hr over 30 Minutes Intravenous Every 6 hours 09/22/18 1702     09/22/18 1700  levofloxacin (LEVAQUIN) tablet 750 mg    Note to Pharmacy:  I would prefer AVELOX  But ok if cannot get it   750 mg Oral Daily 09/22/18 1640     09/22/18 1400  piperacillin-tazobactam (ZOSYN) IVPB 3.375 g     3.375 g 100 mL/hr over 30 Minutes Intravenous  Once 09/22/18 1355 09/22/18 1436   09/22/18 1309  piperacillin-tazobactam (ZOSYN) 3.375 GM/50ML IVPB    Note to Pharmacy:  [name]   : cabinet override      09/22/18 1309 09/23/18 0114      Medications: Scheduled Meds: . enoxaparin (LOVENOX) injection  40 mg Subcutaneous Q24H  . levofloxacin  750 mg Oral Daily  . nicotine  7 mg Transdermal Daily  . sulfamethoxazole-trimethoprim  1 tablet Oral Q12H   Continuous Infusions: . dextrose 5 % and 0.9% NaCl 100 mL/hr at 09/23/18 0512  . imipenem-cilastatin 500 mg (09/23/18 1311)   PRN Meds:.acetaminophen, alum & mag hydroxide-simeth, HYDROmorphone (DILAUDID) injection, ondansetron **OR** ondansetron (ZOFRAN) IV, oxyCODONE, oxymetazoline, senna-docusate, traMADol    Objective: Weight change:   Intake/Output Summary (Last 24 hours) at 09/23/2018 1457 Last data filed at 09/23/2018 1300 Gross per 24 hour  Intake 2626.38 ml  Output -  Net 2626.38 ml   Blood pressure 126/77, pulse 73, temperature 99.1 F (37.3 C), temperature source Oral, resp. rate 17, height 6' (1.829 m), weight 106.6 kg, SpO2 98 %. Temp:  [99 F (37.2 C)-99.5 F (37.5 C)] 99.1 F (37.3 C) (11/30 0514) Pulse Rate:   [53-80] 73 (11/30 0514) Resp:  [11-17] 17 (11/30 0514) BP: (117-146)/(69-83) 126/77 (11/30 0514) SpO2:  [93 %-100 %] 98 % (11/30 0514)  Physical Exam: General: Alert and awake, oriented x3, not in any acute distress. HEENT: anicteric sclera, EOMI CVS regular rate, normal  Chest: , no wheezing, no respiratory distress Abdomen: soft non-distended,  Extremities: no edema or deformity noted bilaterally Skin: no rashes wound not examined  Neuro: nonfocal  CBC:    BMET Recent Labs    09/22/18 0828 09/23/18 0513  NA 134* 135  K 3.8 4.2  CL 95* 101  CO2 30 28  GLUCOSE 119* 132*  BUN 14 16  CREATININE 1.36* 1.26*  CALCIUM 9.2 8.1*     Liver Panel  Recent Labs    09/22/18 0828  PROT 9.3*  ALBUMIN 4.3  AST 15  ALT 17  ALKPHOS 48  BILITOT 0.8       Sedimentation Rate Recent Labs    09/23/18 0513  ESRSEDRATE 55*   C-Reactive Protein Recent Labs    09/23/18 0513  CRP 9.4*    Micro Results: Recent Results (from the past 720 hour(s))  Aerobic/Anaerobic Culture (surgical/deep wound)     Status: None   Collection Time: 09/13/18  4:08 PM  Result Value Ref Range Status   Specimen Description ABSCESS BUTTOCK LEFT  Final   Special Requests NONE  Final  Gram Stain   Final    ABUNDANT WBC PRESENT, PREDOMINANTLY PMN NO ORGANISMS SEEN    Culture   Final    ABUNDANT MYCOBACTERIUM FORTUITUM NO ANAEROBES ISOLATED Performed at [facility], [address], [place], [place] [address]    Report Status 09/19/2018 FINAL  Final  Blood culture (routine x 2)     Status: None (Preliminary result)   Collection Time: 09/22/18  8:28 AM  Result Value Ref Range Status   Specimen Description   Final    BLOOD LEFT ANTECUBITAL Performed at [facility] [address], [place], [place] [address]    Special Requests   Final    BOTTLES DRAWN AEROBIC AND ANAEROBIC Blood Culture adequate volume Performed at [facility]  [address] [place], [place] [address]    Culture   Final    NO GROWTH 1 DAY Performed at [facility], [place] [address] [place], [place] [address]    Report Status PENDING  Incomplete  Blood culture (routine x 2)     Status: None (Preliminary result)   Collection Time: 09/22/18  8:56 AM  Result Value Ref Range Status   Specimen Description   Final    BLOOD RIGHT ANTECUBITAL Performed at [facility] [address], [place], [place] [address]    Special Requests   Final    BOTTLES DRAWN AEROBIC AND ANAEROBIC Blood Culture results may not be optimal due to an excessive volume of blood received in culture bottles Performed at [facility] [address] [place], [place] [address]    Culture   Final    NO GROWTH 1 DAY Performed at [facility], [place] [address], [place], [place] [address]    Report Status PENDING  Incomplete  Aerobic/Anaerobic Culture (surgical/deep wound)     Status: None (Preliminary result)   Collection Time: 09/22/18  2:31 PM  Result Value Ref Range Status   Specimen Description   Final    ABSCESS LEFT GLUTEAL Performed at [facility] [address] [place], [place] [address]    Special Requests   Final    NONE Performed at [facility], [place] [address], [place], [place] [address]    Gram Stain   Final    MODERATE WBC PRESENT,BOTH PMN AND MONONUCLEAR NO ORGANISMS SEEN    Culture   Final    NO GROWTH < 24 HOURS Performed at [facility], [place] [address], [place], [place] [address]    Report Status PENDING  Incomplete  Acid Fast Smear (AFB)     Status: None   Collection Time: 09/22/18  2:31 PM  Result Value Ref Range Status   AFB Specimen Processing Concentration  Final   Acid Fast Smear Negative  Final    Comment: (NOTE) Performed At: [facility] [place], [place] [id number] Rush Farmer MD OM:[id number]    Source (AFB) ABSCESS   Final    Comment: LEFT GLUTEAL Performed at [facility], [place] [address], [place], [place] [address]     Studies/Results: Ct Abdomen Pelvis W Contrast  Result Date: 09/22/2018 CLINICAL DATA:  Left lower abdominal/pelvic pain EXAM: CT ABDOMEN AND PELVIS WITH CONTRAST TECHNIQUE: Multidetector CT imaging of the abdomen and pelvis was performed using the standard protocol following bolus administration of intravenous contrast. CONTRAST:  172mL ISOVUE-300 IOPAMIDOL (ISOVUE-300) INJECTION 61% COMPARISON:  August 31, 2018 FINDINGS: Lower  chest: Lung bases are clear. Hepatobiliary: No focal liver lesions are appreciable. Gallbladder wall is not appreciably thickened. There is no biliary duct dilatation. Pancreas: No pancreatic mass or inflammatory focus. Spleen: No splenic lesions are evident. Adrenals/Urinary Tract: Adrenals bilaterally appear unremarkable. There is again noted a wedge-shaped area of decreased attenuation in the lateral mid left kidney, unchanged from recent CT. No well-defined mass seen in either kidney. No hydronephrosis evident on either side. No renal or ureteral calculus evident on either side. Urinary bladder is midline with wall thickness within normal limits. Stomach/Bowel: There are occasional sigmoid diverticula without diverticulitis. There is no appreciable bowel wall or mesenteric thickening. No bowel obstruction. No free air or portal venous air. Vascular/Lymphatic: No abdominal aortic aneurysm. No appreciable vascular lesions evident. No adenopathy appreciable in the abdomen or pelvis. Reproductive: Prostate and seminal vesicles are normal in size and contour. There is no evident pelvic mass. Other: Appendix appears unremarkable. No abscess or ascites is noted within the peritoneum or retroperitoneal regions. There is a rather minimal ventral hernia containing only fat. Musculoskeletal: There is a loculated abscess in the left buttocks region which abuts the  gluteus maximus muscle in its mid to lateral aspects. This abscess currently measures 11.1 x 3.7 x 14.5 cm. There is overlying cellulitis and adjacent panniculitis in the fat between the skin surface and in the gluteus maximus muscle. There is milder edema in the soft tissues on the right in the buttocks region adjacent to the gluteus maximus muscle without well-defined fluid. No abscess is seen in this area. There are foci of degenerative change in the lumbar spine. There are no blastic or lytic bone lesions. IMPRESSION: 1. Progression of inflammatory change in the left buttocks region compared to prior CT. There is a loculated abscess in this area currently measuring 11.1 x 3.7 x 14.5 cm. Specifically, this loculated abscess abuts the gluteus maximus muscle posterolaterally. There are areas which suggest early muscle invasion from this loculated abscess. There is adjacent panniculitis and cellulitis posterolaterally in the left lower pelvic/upper thigh regions. 2. On the right in the posterior buttocks region adjacent to the gluteus maximus muscle, there is soft tissue thickening but no frank abscess or fluid. 3. A small wedge-shaped area of decreased attenuation in the mid left kidney laterally is again noted, stable, a finding that could represent localized lobar nephronia/acute bacterial nephritis. No surrounding perinephric stranding or fluid. No hydronephrosis on either side. No renal or ureteral calculus on either side. 4. Occasional sigmoid diverticula without diverticulitis. No bowel obstruction. No abscess within the peritoneum or retroperitoneum. Appendix appears normal. Electronically Signed   By: Lowella Grip III M.D.   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   Active Problems:   Abscess   Abscess of  buttock, left   Hydradenitis   Kidney insufficiency    Justin Baldwin is a 42 y.o. 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  He has had worsening despite being treated with Bactrim monotherapy and required surgery to I&D this extensive abscess.  1.  Buttocks abscess with Mycobacterium fortuitum:]  We had extensive discussion today about the nature of these types of infections and how they require multiple agents.  He does work as a English as a second language teacher and is not eager to be on an IV antibiotic when he leaves the hospital.  While he is in the hospital I would like to continue him on IV imipenem plus levofloxacin plus Bactrim which I am adding today.  When he LEAVES  I would recommend that he go out on Avelox 400 mg daily, Bactrim 1 double strength tablet twice daily and Zyvox 600 mg twice daily with 30 day supply and rerfills  This will require protracted therapy  Will need a CBC with differential in next 2 weeks.  While he has an appointment with [place] this week that should be rescheduled for a few weeks from now we have more data including hopefully susceptibility testing results.  I will try to schedule him scores shortly before the Christmas holidays  Otherwise I will sign off for now please call with further questions.   LOS: 1 day   Alcide Evener 09/23/2018, 2:57 PM

## 2018-09-24 ENCOUNTER — Encounter (HOSPITAL_COMMUNITY): Payer: Self-pay | Admitting: General Surgery

## 2018-09-24 LAB — CBC
HCT: 42.6 % (ref 39.0–52.0)
HEMOGLOBIN: 13.9 g/dL (ref 13.0–17.0)
MCH: 31.8 pg (ref 26.0–34.0)
MCHC: 32.6 g/dL (ref 30.0–36.0)
MCV: 97.5 fL (ref 80.0–100.0)
Platelets: 260 10*3/uL (ref 150–400)
RBC: 4.37 MIL/uL (ref 4.22–5.81)
RDW: 11.8 % (ref 11.5–15.5)
WBC: 12 10*3/uL — AB (ref 4.0–10.5)
nRBC: 0 % (ref 0.0–0.2)

## 2018-09-24 LAB — BASIC METABOLIC PANEL
Anion gap: 5 (ref 5–15)
BUN: 13 mg/dL (ref 6–20)
CHLORIDE: 101 mmol/L (ref 98–111)
CO2: 26 mmol/L (ref 22–32)
Calcium: 7.8 mg/dL — ABNORMAL LOW (ref 8.9–10.3)
Creatinine, Ser: 1.28 mg/dL — ABNORMAL HIGH (ref 0.61–1.24)
GFR calc Af Amer: >60 mL/min (ref >60)
GFR calc non Af Amer: >60 mL/min (ref >60)
Glucose, Bld: 124 mg/dL — ABNORMAL HIGH (ref 70–99)
Potassium: 4.1 mmol/L (ref 3.5–5.1)
Sodium: 132 mmol/L — ABNORMAL LOW (ref 135–145)

## 2018-09-24 LAB — HEPATITIS C ANTIBODY (REFLEX): HCV Ab: 0.1 s/co ratio (ref 0.0–0.9)

## 2018-09-24 LAB — HCV COMMENT:

## 2018-09-24 MED ORDER — DIPHENHYDRAMINE HCL 25 MG PO CAPS
25.0000 mg | ORAL_CAPSULE | Freq: Once | ORAL | Status: AC
  Administered 2018-09-24: 25 mg via ORAL
  Filled 2018-09-24: qty 1

## 2018-09-24 MED ORDER — LINEZOLID 600 MG PO TABS
600.0000 mg | ORAL_TABLET | Freq: Two times a day (BID) | ORAL | Status: DC
  Administered 2018-09-24 – 2018-09-27 (×7): 600 mg via ORAL
  Filled 2018-09-24 (×8): qty 1

## 2018-09-24 NOTE — Progress Notes (Addendum)
PROGRESS NOTE    Justin Baldwin  WRU:[id number] DOB: 1976/04/03 DOA: 09/22/2018 PCP: Pleas [name], NP   Brief Narrative:  HPI on 09/22/2018 by Dr. Oretha Milch Justin Baldwin is a 42 y.o. male with medical history significant for Hidradenitis (previously on Humira) who presents on 09/22/2018 with several weeks of painful mass on left buttocks found to be left buttocks abscess on 11/7 on bactrim x 21 days with failed outpatient treatment with persistent fevers and pain.   He reports 2 months ago hitting himself with a dumbbell where he noticed a lump. It was initially thought to be a hematoma/deep tissue injury. There was then a concern for infection because it didn't go down in size.  11/7 evaluated by PCP who obtain CT abdomen which showed superficial soft tissue abscess of left buttocks with adjacent cellulitis  prompting initiation of bactrim x 2weeks .  Of note prior to that, he was on doxycycline x 14 days prescribed at an urgent care visit for an "inflamed ankle"  He was then evaluated by [facility] surgery on 11/8 where he was recommended to continue Bactrim for additional 2 weeks.  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  Given the growth of his right organism and persistent fevers surgery and outpatient referral to ID was obtained. He reports having a fever for "19 straight days" its usually 100.5 with a max of 104, he'll have subjective chills and sweats. No drainage from the area.    He was directed to ED because of ongoing pain, fever, and concern for worsening infection.  Hidradenitis. Stopped Humira (was on for 3 years) more than 2 months ago due to concern for potential allergic reaction.  Usually he has lesions in his chest and back but has noticed any active lesions since discontinuing  Interim history Admitted for left buttock abcess, s/p I&D by general surgery. ID consulted.   Assessment & Plan   Left  buttock loculated abscess with surrounding panniculitis and cellulitis -CT scan and pelvis showed progression of inflammatory change in the left buttocks region compared to prior CT, loculated abscess 11.1 x 3.7 x 14.5 cm. 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  Recommending daily wound changes and feels patient may likely need several days before he is able to tolerate dressing changes at home -Infectious disease consulted and appreciated, recommending imipenem plus Levaquin and Bactrim.  Upon discharge, recommended Avelox, Bactrim, Zyvox -Blood cultures show no growth to date -Continue imipenem -Bactrim and Levaquin started by infectious disease on 09/23/2018. -Continue pain control -Despite being on antibiotics, patient spiked a fever of 103 F on 09/23/2018.  Discussed with infectious disease, Dr. Tommy Medal today.  Will discontinue Bactrim placed on Zyvox and continue to monitor.  Patient stated that he has been having evening fevers for the past 20 days, however, when he ran out of Bactrim prior to admission, noted that his fevers subsided. -Previous wound culture sent for sensitivities, however, per ID, may take up to 1 month to return.  Hidradenitis -Stable, small pustules on left posterior shoulder otherwise no active lesions -Was on Humira injections  Chronic kidney disease, stage II -Creatinine currently 1.28, appears to be at baseline -Creatinine back in 2017 was 1.4  DVT Prophylaxis Lovenox  Code Status: Full  Family Communication: None at bedside  Disposition Plan: Admitted.  Suspect discharged home in a few days when fevers  subside and patient is able to change dressing.  Consultants General surgery Infectious disease  Procedures  I&D of left buttock abscess  Antibiotics   Anti-infectives (From admission, onward)   Start     Dose/Rate Route Frequency Ordered Stop   09/24/18  1100  linezolid (ZYVOX) tablet 600 mg     600 mg Oral Every 12 hours 09/24/18 1048     09/23/18 1200  sulfamethoxazole-trimethoprim (BACTRIM DS,SEPTRA DS) 800-160 MG per tablet 1 tablet  Status:  Discontinued     1 tablet Oral Every 12 hours 09/23/18 1131 09/24/18 1046   09/22/18 1800  imipenem-cilastatin (PRIMAXIN) 500 mg in sodium chloride 0.9 % 100 mL IVPB     500 mg 200 mL/hr over 30 Minutes Intravenous Every 6 hours 09/22/18 1702     09/22/18 1700  levofloxacin (LEVAQUIN) tablet 750 mg    Note to Pharmacy:  I would prefer AVELOX  But ok if cannot get it   750 mg Oral Daily 09/22/18 1640     09/22/18 1400  piperacillin-tazobactam (ZOSYN) IVPB 3.375 g     3.375 g 100 mL/hr over 30 Minutes Intravenous  Once 09/22/18 1355 09/22/18 1436   09/22/18 1309  piperacillin-tazobactam (ZOSYN) 3.375 GM/50ML IVPB    Note to Pharmacy:  [name]   : cabinet override      09/22/18 1309 09/23/18 0114      Subjective:   [name] seen and examined today.  Has no complaints this morning.  Does complain of left buttock pain occasionally.  Denies current chest pain, shortness of breath, abdominal pain, nausea or vomiting, diarrhea or constipation, dizziness or headache.  Does endorse having fevers in the evening time for the last 20 days or so.  Objective:   Vitals:   09/23/18 1838 09/23/18 2038 09/23/18 2201 09/24/18 0531  BP: (!) 145/82 135/67  (!) 157/83  Pulse: 84 76  70  Resp:  18  18  Temp: (!) 103.3 F (39.6 C) (!) 102.5 F (39.2 C) 99.7 F (37.6 C) 98.7 F (37.1 C)  TempSrc: Oral Oral Oral Oral  SpO2: 100% 96%  96%  Weight:      Height:        Intake/Output Summary (Last 24 hours) at 09/24/2018 1048 Last data filed at 09/24/2018 7371 Gross per 24 hour  Intake 2860 ml  Output -  Net 2860 ml   Filed Weights   09/22/18 0807  Weight: 106.6 kg   Exam  General: Well developed, well nourished, NAD, appears stated age  HEENT: NCAT, mucous membranes moist.   Neck:  Supple  Cardiovascular: S1 S2 auscultated, no rubs, murmurs or gallops. Regular rate and rhythm.  Respiratory: Clear to auscultation bilaterally with equal chest rise  Abdomen: Soft, nontender, nondistended, + bowel sounds  Extremities: warm dry without cyanosis clubbing or edema  Neuro: AAOx3, nonfocal  Skin: Multiple tattoos.  Dressing on left buttock  Psych: Normal affect and demeanor with intact judgement and insight, pleasant  Data Reviewed: I have personally reviewed following labs and imaging studies  CBC: Recent Labs  Lab 09/22/18 0828 09/23/18 0513 09/24/18 0347  WBC 13.7* 8.8 12.0*  NEUTROABS 8.5*  --   --   HGB 16.8 14.4 13.9  HCT 50.3 44.6 42.6  MCV 96.5 97.2 97.5  PLT 323 293 062   Basic Metabolic Panel: Recent Labs  Lab 09/22/18 0828 09/23/18 0513 09/24/18 0347  NA 134* 135 132*  K 3.8 4.2 4.1  CL 95* 101 101  CO2 30 28 26   GLUCOSE 119* 132* 124*  BUN 14 16 13   CREATININE 1.36* 1.26* 1.28*  CALCIUM 9.2 8.1* 7.8*   GFR: Estimated Creatinine Clearance: 94.9 mL/min (A) (by C-G formula based on SCr of 1.28 mg/dL (H)). Liver Function Tests: Recent Labs  Lab 09/22/18 0828  AST 15  ALT 17  ALKPHOS 48  BILITOT 0.8  PROT 9.3*  ALBUMIN 4.3   No results for input(s): LIPASE, AMYLASE in the last 168 hours. No results for input(s): AMMONIA in the last 168 hours. Coagulation Profile: No results for input(s): INR, PROTIME in the last 168 hours. Cardiac Enzymes: No results for input(s): CKTOTAL, CKMB, CKMBINDEX, TROPONINI in the last 168 hours. BNP (last 3 results) No results for input(s): PROBNP in the last 8760 hours. HbA1C: No results for input(s): HGBA1C in the last 72 hours. CBG: No results for input(s): GLUCAP in the last 168 hours. Lipid Profile: No results for input(s): CHOL, HDL, LDLCALC, TRIG, CHOLHDL, LDLDIRECT in the last 72 hours. Thyroid Function Tests: No results for input(s): TSH, T4TOTAL, FREET4, T3FREE, THYROIDAB in the last 72  hours. Anemia Panel: No results for input(s): VITAMINB12, FOLATE, FERRITIN, TIBC, IRON, RETICCTPCT in the last 72 hours. Urine analysis: No results found for: COLORURINE, APPEARANCEUR, LABSPEC, PHURINE, GLUCOSEU, HGBUR, BILIRUBINUR, KETONESUR, PROTEINUR, UROBILINOGEN, NITRITE, LEUKOCYTESUR Sepsis Labs: @LABRCNTIP (procalcitonin:4,lacticidven:4)  ) Recent Results (from the past 240 hour(s))  Blood culture (routine x 2)     Status: None (Preliminary result)   Collection Time: 09/22/18  8:28 AM  Result Value Ref Range Status   Specimen Description   Final    BLOOD LEFT ANTECUBITAL Performed at [facility] [address] [place], [place] [address]    Special Requests   Final    BOTTLES DRAWN AEROBIC AND ANAEROBIC Blood Culture adequate volume Performed at [facility] [address], [place], [place] [address]    Culture   Final    NO GROWTH 2 DAYS Performed at [facility] [address], [place], [place] [address]    Report Status PENDING  Incomplete  Blood culture (routine x 2)     Status: None (Preliminary result)   Collection Time: 09/22/18  8:56 AM  Result Value Ref Range Status   Specimen Description   Final    BLOOD RIGHT ANTECUBITAL Performed at [facility] [address], [place], [place] [address]    Special Requests   Final    BOTTLES DRAWN AEROBIC AND ANAEROBIC Blood Culture results may not be optimal due to an excessive volume of blood received in culture bottles Performed at [facility] [address], [place], [place] [address]    Culture   Final    NO GROWTH 2 DAYS Performed at [facility] [address] [place], [place] [address]    Report Status PENDING  Incomplete  Aerobic/Anaerobic Culture (surgical/deep wound)     Status: None (Preliminary result)   Collection Time: 09/22/18  2:31 PM  Result Value Ref Range Status   Specimen Description   Final    ABSCESS  LEFT GLUTEAL Performed at [facility] [address] [place], [place] [address]    Special Requests   Final    NONE Performed at [facility], [place] [address], [place], [place] [address]    Gram Stain   Final    MODERATE WBC PRESENT,BOTH PMN AND MONONUCLEAR NO ORGANISMS SEEN    Culture  Final    NO GROWTH < 24 HOURS Performed at [facility], [place] [address], [place], [place] [address]    Report Status PENDING  Incomplete  Acid Fast Smear (AFB)     Status: None   Collection Time: 09/22/18  2:31 PM  Result Value Ref Range Status   AFB Specimen Processing Concentration  Final   Acid Fast Smear Negative  Final    Comment: (NOTE) Performed At: [facility] [place], [place] [id number] Rush Farmer MD UY:[id number]    Source (AFB) ABSCESS  Final    Comment: LEFT GLUTEAL Performed at [facility], [place] [address], [place], [place] [address]       Radiology Studies: No results found.   Scheduled Meds: . enoxaparin (LOVENOX) injection  40 mg Subcutaneous Q24H  . levofloxacin  750 mg Oral Daily  . linezolid  600 mg Oral Q12H  . nicotine  7 mg Transdermal Daily   Continuous Infusions: . imipenem-cilastatin 500 mg (09/24/18 0501)     LOS: 2 days   Time Spent in minutes   45 minutes (greater than 50% of time spent with patient face to face, as well as reviewing records, discussing with consultants, and formulating a plan)   Cristal Ford D.O. on 09/24/2018 at 10:48 AM  Between 7am to 7pm - Please see pager noted on [email or web address]  After 7pm go to [email or web address]  And look for the night coverage person covering for me after hours  [facility] Office  [phone]

## 2018-09-24 NOTE — Progress Notes (Signed)
2 Days Post-Op I&D Subjective: Fever overnight, dressing change yesterday was painful  Objective: Vital signs in last 24 hours: Temp:  [98.7 F (37.1 C)-103.3 F (39.6 C)] 98.7 F (37.1 C) (12/01 0531) Pulse Rate:  [70-84] 70 (12/01 0531) Resp:  [18] 18 (12/01 0531) BP: (135-157)/(67-83) 157/83 (12/01 0531) SpO2:  [96 %-100 %] 96 % (12/01 0531)   Intake/Output from previous day: 11/30 0701 - 12/01 0700 In: 3100 [P.O.:1800; I.V.:1100; IV Piggyback:200] Out: -  Intake/Output this shift: No intake/output data recorded.   General appearance: alert and cooperative  Incision: packed  Lab Results:  Recent Labs    09/23/18 0513 09/24/18 0347  WBC 8.8 12.0*  HGB 14.4 13.9  HCT 44.6 42.6  PLT 293 260   BMET Recent Labs    09/23/18 0513 09/24/18 0347  NA 135 132*  K 4.2 4.1  CL 101 101  CO2 28 26  GLUCOSE 132* 124*  BUN 16 13  CREATININE 1.26* 1.28*  CALCIUM 8.1* 7.8*   PT/INR No results for input(s): LABPROT, INR in the last 72 hours. ABG No results for input(s): PHART, HCO3 in the last 72 hours.  Invalid input(s): PCO2, PO2  MEDS, Scheduled . enoxaparin (LOVENOX) injection  40 mg Subcutaneous Q24H  . levofloxacin  750 mg Oral Daily  . nicotine  7 mg Transdermal Daily  . sulfamethoxazole-trimethoprim  1 tablet Oral Q12H    Studies/Results: Ct Abdomen Pelvis W Contrast  Result Date: 09/22/2018 CLINICAL DATA:  Left lower abdominal/pelvic pain EXAM: CT ABDOMEN AND PELVIS WITH CONTRAST TECHNIQUE: Multidetector CT imaging of the abdomen and pelvis was performed using the standard protocol following bolus administration of intravenous contrast. CONTRAST:  146mL ISOVUE-300 IOPAMIDOL (ISOVUE-300) INJECTION 61% COMPARISON:  August 31, 2018 FINDINGS: Lower chest: Lung bases are clear. Hepatobiliary: No focal liver lesions are appreciable. Gallbladder wall is not appreciably thickened. There is no biliary duct dilatation. Pancreas: No pancreatic mass or inflammatory  focus. Spleen: No splenic lesions are evident. Adrenals/Urinary Tract: Adrenals bilaterally appear unremarkable. There is again noted a wedge-shaped area of decreased attenuation in the lateral mid left kidney, unchanged from recent CT. No well-defined mass seen in either kidney. No hydronephrosis evident on either side. No renal or ureteral calculus evident on either side. Urinary bladder is midline with wall thickness within normal limits. Stomach/Bowel: There are occasional sigmoid diverticula without diverticulitis. There is no appreciable bowel wall or mesenteric thickening. No bowel obstruction. No free air or portal venous air. Vascular/Lymphatic: No abdominal aortic aneurysm. No appreciable vascular lesions evident. No adenopathy appreciable in the abdomen or pelvis. Reproductive: Prostate and seminal vesicles are normal in size and contour. There is no evident pelvic mass. Other: Appendix appears unremarkable. No abscess or ascites is noted within the peritoneum or retroperitoneal regions. There is a rather minimal ventral hernia containing only fat. Musculoskeletal: There is a loculated abscess in the left buttocks region which abuts the gluteus maximus muscle in its mid to lateral aspects. This abscess currently measures 11.1 x 3.7 x 14.5 cm. There is overlying cellulitis and adjacent panniculitis in the fat between the skin surface and in the gluteus maximus muscle. There is milder edema in the soft tissues on the right in the buttocks region adjacent to the gluteus maximus muscle without well-defined fluid. No abscess is seen in this area. There are foci of degenerative change in the lumbar spine. There are no blastic or lytic bone lesions. IMPRESSION: 1. Progression of inflammatory change in the left buttocks region compared  to prior CT. There is a loculated abscess in this area currently measuring 11.1 x 3.7 x 14.5 cm. Specifically, this loculated abscess abuts the gluteus maximus muscle  posterolaterally. There are areas which suggest early muscle invasion from this loculated abscess. There is adjacent panniculitis and cellulitis posterolaterally in the left lower pelvic/upper thigh regions. 2. On the right in the posterior buttocks region adjacent to the gluteus maximus muscle, there is soft tissue thickening but no frank abscess or fluid. 3. A small wedge-shaped area of decreased attenuation in the mid left kidney laterally is again noted, stable, a finding that could represent localized lobar nephronia/acute bacterial nephritis. No surrounding perinephric stranding or fluid. No hydronephrosis on either side. No renal or ureteral calculus on either side. 4. Occasional sigmoid diverticula without diverticulitis. No bowel obstruction. No abscess within the peritoneum or retroperitoneum. Appendix appears normal. Electronically Signed   By: Lowella Grip III M.D.   On: 09/22/2018 10:02    Assessment: s/p Procedure(s): IRRIGATION AND DEBRIDEMENT ABSCESS Patient Active Problem List   Diagnosis Date Noted  . Abscess 09/22/2018  . Abscess of buttock, left 09/22/2018  . Hydradenitis 09/22/2018  . Kidney insufficiency 09/22/2018  . Lump 08/31/2018  . Deep tissue injury 08/25/2018  . Skin rash 07/25/2018  . Fatigue 11/08/2017  . Vertigo 07/11/2017  . Preventative health care 03/29/2016  . Insomnia 03/15/2016  . Erectile dysfunction 03/15/2016      Plan: Cont dressing changes.  Pt will probably need several days before he will be able to tolerate dressing changes at home Abx per ID   LOS: 2 days     .Rosario Adie, [facility], [place]   09/24/2018 8:51 AM

## 2018-09-25 LAB — AEROBIC/ANAEROBIC CULTURE W GRAM STAIN (SURGICAL/DEEP WOUND)

## 2018-09-25 LAB — BASIC METABOLIC PANEL
Anion gap: 7 (ref 5–15)
BUN: 16 mg/dL (ref 6–20)
CO2: 24 mmol/L (ref 22–32)
Calcium: 8 mg/dL — ABNORMAL LOW (ref 8.9–10.3)
Chloride: 103 mmol/L (ref 98–111)
Creatinine, Ser: 1.28 mg/dL — ABNORMAL HIGH (ref 0.61–1.24)
GFR calc Af Amer: >60 mL/min (ref >60)
Glucose, Bld: 148 mg/dL — ABNORMAL HIGH (ref 70–99)
Potassium: 4 mmol/L (ref 3.5–5.1)
SODIUM: 134 mmol/L — AB (ref 135–145)

## 2018-09-25 LAB — AEROBIC/ANAEROBIC CULTURE (SURGICAL/DEEP WOUND)

## 2018-09-25 MED ORDER — METHOCARBAMOL 500 MG PO TABS
500.0000 mg | ORAL_TABLET | Freq: Three times a day (TID) | ORAL | Status: DC
  Administered 2018-09-25: 500 mg via ORAL
  Filled 2018-09-25: qty 1

## 2018-09-25 MED ORDER — BISACODYL 5 MG PO TBEC
5.0000 mg | DELAYED_RELEASE_TABLET | Freq: Once | ORAL | Status: AC
  Administered 2018-09-25: 5 mg via ORAL
  Filled 2018-09-25: qty 1

## 2018-09-25 MED ORDER — ACETAMINOPHEN 500 MG PO TABS
1000.0000 mg | ORAL_TABLET | Freq: Four times a day (QID) | ORAL | Status: DC
  Administered 2018-09-25 – 2018-09-27 (×9): 1000 mg via ORAL
  Filled 2018-09-25 (×9): qty 2

## 2018-09-25 MED ORDER — METHOCARBAMOL 500 MG PO TABS
500.0000 mg | ORAL_TABLET | Freq: Three times a day (TID) | ORAL | Status: DC | PRN
  Administered 2018-09-25 – 2018-09-27 (×5): 500 mg via ORAL
  Filled 2018-09-25 (×5): qty 1

## 2018-09-25 MED ORDER — HYDROMORPHONE HCL 1 MG/ML IJ SOLN
0.5000 mg | INTRAMUSCULAR | Status: DC | PRN
  Administered 2018-09-25: 0.5 mg via INTRAVENOUS
  Administered 2018-09-26 – 2018-09-27 (×2): 1 mg via INTRAVENOUS
  Filled 2018-09-25 (×3): qty 1

## 2018-09-25 NOTE — Progress Notes (Signed)
Patient ID: Justin Baldwin, male   DOB: July 01, 1976, 42 y.o.   MRN: [id number]    3 Days Post-Op  Subjective: Patient c/o itching ever since Zyvox added yesterday apparently.  No other new complaints.  Objective: Vital signs in last 24 hours: Temp:  [98.4 F (36.9 C)-101.3 F (38.5 C)] 99.5 F (37.5 C) (12/02 0630) Pulse Rate:  [74-76] 76 (12/02 0630) Resp:  [16-18] 18 (12/02 0630) BP: (128-167)/(62-79) 167/62 (12/02 0630) SpO2:  [97 %-99 %] 97 % (12/02 0630) Last BM Date: 09/22/18  Intake/Output from previous day: 12/01 0701 - 12/02 0700 In: 1880 [P.O.:1680; IV Piggyback:200] Out: -  Intake/Output this shift: No intake/output data recorded.  PE: Skin: packing removed.  Wound is clean with no purulent drainage noted.  Penrose in place.  No significant erythema or induration noted, but still with some anasarca around this area.  Lab Results:  Recent Labs    09/23/18 0513 09/24/18 0347  WBC 8.8 12.0*  HGB 14.4 13.9  HCT 44.6 42.6  PLT 293 260   BMET Recent Labs    09/24/18 0347 09/25/18 0352  NA 132* 134*  K 4.1 4.0  CL 101 103  CO2 26 24  GLUCOSE 124* 148*  BUN 13 16  CREATININE 1.28* 1.28*  CALCIUM 7.8* 8.0*   PT/INR No results for input(s): LABPROT, INR in the last 72 hours. CMP     Component Value Date/Time   NA 134 (L) 09/25/2018 0352   K 4.0 09/25/2018 0352   CL 103 09/25/2018 0352   CO2 24 09/25/2018 0352   GLUCOSE 148 (H) 09/25/2018 0352   BUN 16 09/25/2018 0352   CREATININE 1.28 (H) 09/25/2018 0352   CALCIUM 8.0 (L) 09/25/2018 0352   PROT 9.3 (H) 09/22/2018 0828   ALBUMIN 4.3 09/22/2018 0828   AST 15 09/22/2018 0828   ALT 17 09/22/2018 0828   ALKPHOS 48 09/22/2018 0828   BILITOT 0.8 09/22/2018 0828   GFRNONAA >60 09/25/2018 0352   GFRAA >60 09/25/2018 0352   Lipase  No results found for: LIPASE     Studies/Results: No results found.  Anti-infectives: Anti-infectives (From admission, onward)   Start     Dose/Rate Route Frequency  Ordered Stop   09/24/18 1200  linezolid (ZYVOX) tablet 600 mg     600 mg Oral Every 12 hours 09/24/18 1048     09/23/18 1200  sulfamethoxazole-trimethoprim (BACTRIM DS,SEPTRA DS) 800-160 MG per tablet 1 tablet  Status:  Discontinued     1 tablet Oral Every 12 hours 09/23/18 1131 09/24/18 1046   09/22/18 1800  imipenem-cilastatin (PRIMAXIN) 500 mg in sodium chloride 0.9 % 100 mL IVPB     500 mg 200 mL/hr over 30 Minutes Intravenous Every 6 hours 09/22/18 1702     09/22/18 1700  levofloxacin (LEVAQUIN) tablet 750 mg    Note to Pharmacy:  I would prefer AVELOX  But ok if cannot get it   750 mg Oral Daily 09/22/18 1640     09/22/18 1400  piperacillin-tazobactam (ZOSYN) IVPB 3.375 g     3.375 g 100 mL/hr over 30 Minutes Intravenous  Once 09/22/18 1355 09/22/18 1436   09/22/18 1309  piperacillin-tazobactam (ZOSYN) 3.375 GM/50ML IVPB    Note to Pharmacy:  [name]   : cabinet override      09/22/18 1309 09/23/18 0114       Assessment/Plan POD 3, s/p I&D of gluteal abscess -CX: mycobacterium fortuitum -still with fever overnight.   -on multiple abx  therapy.  Will defer to ID about Zyvox and itching. -wound is clean and he tolerated his dressing change without dilaudid today.  Scheduled tylenol, prn oxy and robaxin -will try to have wife look at dressing change tomorrow to see if she can do this for him at home. -wound is stable. -will need to hold humera injections until this heals  FEN - regular VTE - Lovenox ID - primaxin, zyvox, levaquin   LOS: 3 days    Henreitta Cea , [facility] 09/25/2018, 10:36 AM Pager: [phone]

## 2018-09-25 NOTE — Care Management Note (Signed)
Case Management Note  Patient Details  Name: Justin Baldwin MRN: [id number] Date of Birth: 03/09/1976  Subjective/Objective:Gluteal abscess. From home w/spouse. Recc for HHRN-packing,drain care. [place] chosen-rep Santiago Glad aware. Will need face to face order for [facility].                    Action/Plan:dc home w/HHC.   Expected Discharge Date:  (unknown)               Expected Discharge Plan:  [place]  In-House Referral:     Discharge planning Services  CM Consult  Post Acute Care Choice:    Choice offered to:  Patient  DME Arranged:    DME Agency:     HH Arranged:  RN [place] Agency:  [place]  Status of Service:  In process, will continue to follow  If discussed at Long Length of Stay Meetings, dates discussed:    Additional Comments:  Dessa Phi, RN 09/25/2018, 12:55 PM

## 2018-09-25 NOTE — Progress Notes (Signed)
PROGRESS NOTE    Justin Baldwin  YQI:[id number] DOB: 03/15/1976 DOA: 09/22/2018 PCP: Pleas [name], NP   Brief Narrative:  HPI on 09/22/2018 by Dr. Oretha Milch Justin Baldwin is a 42 y.o. male with medical history significant for Hidradenitis (previously on Humira) who presents on 09/22/2018 with several weeks of painful mass on left buttocks found to be left buttocks abscess on 11/7 on bactrim x 21 days with failed outpatient treatment with persistent fevers and pain.   He reports 2 months ago hitting himself with a dumbbell where he noticed a lump. It was initially thought to be a hematoma/deep tissue injury. There was then a concern for infection because it didn't go down in size.  11/7 evaluated by PCP who obtain CT abdomen which showed superficial soft tissue abscess of left buttocks with adjacent cellulitis  prompting initiation of bactrim x 2weeks .  Of note prior to that, he was on doxycycline x 14 days prescribed at an urgent care visit for an "inflamed ankle"  He was then evaluated by [facility] surgery on 11/8 where he was recommended to continue Bactrim for additional 2 weeks.  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  Given the growth of his right organism and persistent fevers surgery and outpatient referral to ID was obtained. He reports having a fever for "19 straight days" its usually 100.5 with a max of 104, he'll have subjective chills and sweats. No drainage from the area.    He was directed to ED because of ongoing pain, fever, and concern for worsening infection.  Hidradenitis. Stopped Humira (was on for 3 years) more than 2 months ago due to concern for potential allergic reaction.  Usually he has lesions in his chest and back but has noticed any active lesions since discontinuing  Interim history Admitted for left buttock abcess, s/p I&D by general surgery. ID consulted.   Assessment & Plan   Left  buttock loculated abscess with surrounding panniculitis and cellulitis -CT scan and pelvis showed progression of inflammatory change in the left buttocks region compared to prior CT, loculated abscess 11.1 x 3.7 x 14.5 cm. 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  Recommending daily wound changes and feels patient may likely need several days before he is able to tolerate dressing changes at home -Infectious disease consulted and appreciated, recommending imipenem plus Levaquin and Bactrim.  Upon discharge, recommended Avelox, Bactrim, Zyvox -Blood cultures show no growth to date -Continue imipenem -Bactrim and Levaquin started by infectious disease on 09/23/2018. -Continue pain control -Despite being on antibiotics, patient spiked a fever of 103 F on 09/23/2018.  Discussed with infectious disease, Dr. Tommy Medal 09/24/2018.  Will discontinue Bactrim placed on Zyvox and continue to monitor.  Patient stated that he has been having evening fevers for the past 20 days, however, when he ran out of Bactrim prior to admission, noted that his fevers subsided. -Previous wound culture sent for sensitivities, however, per ID, may take up to 1 month to return. -Discussed with patient, he will need to have his wife learn and watch his wound care.  -Case management consulted for [facility] wound care  Hidradenitis -Stable, small pustules on left posterior shoulder otherwise no active lesions -Was on Humira injections  Chronic kidney disease, stage II -Creatinine currently 1.28, appears to be at baseline -Creatinine back in 2017 was 1.4  DVT Prophylaxis Lovenox  Code Status: Full  Family Communication: None at bedside  Disposition Plan: Admitted.  Suspect discharged home in a few days when fevers subside and patient is able to change dressing.  Consultants General surgery Infectious disease  Procedures  I&D of left  buttock abscess  Antibiotics   Anti-infectives (From admission, onward)   Start     Dose/Rate Route Frequency Ordered Stop   09/24/18 1200  linezolid (ZYVOX) tablet 600 mg     600 mg Oral Every 12 hours 09/24/18 1048     09/23/18 1200  sulfamethoxazole-trimethoprim (BACTRIM DS,SEPTRA DS) 800-160 MG per tablet 1 tablet  Status:  Discontinued     1 tablet Oral Every 12 hours 09/23/18 1131 09/24/18 1046   09/22/18 1800  imipenem-cilastatin (PRIMAXIN) 500 mg in sodium chloride 0.9 % 100 mL IVPB     500 mg 200 mL/hr over 30 Minutes Intravenous Every 6 hours 09/22/18 1702     09/22/18 1700  levofloxacin (LEVAQUIN) tablet 750 mg    Note to Pharmacy:  I would prefer AVELOX  But ok if cannot get it   750 mg Oral Daily 09/22/18 1640     09/22/18 1400  piperacillin-tazobactam (ZOSYN) IVPB 3.375 g     3.375 g 100 mL/hr over 30 Minutes Intravenous  Once 09/22/18 1355 09/22/18 1436   09/22/18 1309  piperacillin-tazobactam (ZOSYN) 3.375 GM/50ML IVPB    Note to Pharmacy:  [name]   : cabinet override      09/22/18 1309 09/23/18 0114      Subjective:   Justin Baldwin seen and examined today.  No new complaints this morning. Worried about his wound care and changing his dressing. Denies current chest pain, shortness of breath, abdominal pain, N/V/D/C.   Objective:   Vitals:   09/24/18 1918 09/24/18 2114 09/25/18 0001 09/25/18 0630  BP:  (!) 162/79  (!) 167/62  Pulse:  74  76  Resp:  18  18  Temp: (!) 101.3 F (38.5 C) 100 F (37.8 C) 98.4 F (36.9 C) 99.5 F (37.5 C)  TempSrc: Oral Oral Oral Oral  SpO2:  97%  97%  Weight:      Height:        Intake/Output Summary (Last 24 hours) at 09/25/2018 1159 Last data filed at 09/25/2018 0600 Gross per 24 hour  Intake 1880 ml  Output -  Net 1880 ml   Filed Weights   09/22/18 0807  Weight: 106.6 kg   Exam  General: Well developed, well nourished, NAD, appears stated age  62: NCAT, mucous membranes moist.   Neck:  Supple  Cardiovascular: S1 S2 auscultated, no murmurs, RRR  Respiratory: Clear to auscultation bilaterally with equal chest rise  Abdomen: Soft, nontender, nondistended, + bowel sounds  Extremities: warm dry without cyanosis clubbing or edema  Neuro: AAOx3, nonfocal  Skin: Multiple tattoos. Dressing on left buttock.   Psych: Appropriate mood and affect, pleasant   Data Reviewed: I have personally reviewed following labs and imaging studies  CBC: Recent Labs  Lab 09/22/18 0828 09/23/18 0513 09/24/18 0347  WBC 13.7* 8.8 12.0*  NEUTROABS 8.5*  --   --   HGB 16.8 14.4 13.9  HCT 50.3 44.6 42.6  MCV 96.5 97.2 97.5  PLT 323 293 409   Basic Metabolic Panel: Recent Labs  Lab 09/22/18 0828 09/23/18 0513 09/24/18 0347 09/25/18 0352  NA 134* 135 132* 134*  K 3.8 4.2 4.1 4.0  CL 95* 101 101 103  CO2 30 28 26  24  GLUCOSE 119* 132* 124* 148*  BUN 14 16 13 16   CREATININE 1.36* 1.26* 1.28* 1.28*  CALCIUM 9.2 8.1* 7.8* 8.0*   GFR: Estimated Creatinine Clearance: 94.9 mL/min (A) (by C-G formula based on SCr of 1.28 mg/dL (H)). Liver Function Tests: Recent Labs  Lab 09/22/18 0828  AST 15  ALT 17  ALKPHOS 48  BILITOT 0.8  PROT 9.3*  ALBUMIN 4.3   No results for input(s): LIPASE, AMYLASE in the last 168 hours. No results for input(s): AMMONIA in the last 168 hours. Coagulation Profile: No results for input(s): INR, PROTIME in the last 168 hours. Cardiac Enzymes: No results for input(s): CKTOTAL, CKMB, CKMBINDEX, TROPONINI in the last 168 hours. BNP (last 3 results) No results for input(s): PROBNP in the last 8760 hours. HbA1C: No results for input(s): HGBA1C in the last 72 hours. CBG: No results for input(s): GLUCAP in the last 168 hours. Lipid Profile: No results for input(s): CHOL, HDL, LDLCALC, TRIG, CHOLHDL, LDLDIRECT in the last 72 hours. Thyroid Function Tests: No results for input(s): TSH, T4TOTAL, FREET4, T3FREE, THYROIDAB in the last 72 hours. Anemia  Panel: No results for input(s): VITAMINB12, FOLATE, FERRITIN, TIBC, IRON, RETICCTPCT in the last 72 hours. Urine analysis: No results found for: COLORURINE, APPEARANCEUR, LABSPEC, PHURINE, GLUCOSEU, HGBUR, BILIRUBINUR, KETONESUR, PROTEINUR, UROBILINOGEN, NITRITE, LEUKOCYTESUR Sepsis Labs: @LABRCNTIP (procalcitonin:4,lacticidven:4)  ) Recent Results (from the past 240 hour(s))  Blood culture (routine x 2)     Status: None (Preliminary result)   Collection Time: 09/22/18  8:28 AM  Result Value Ref Range Status   Specimen Description   Final    BLOOD LEFT ANTECUBITAL Performed at [facility] [address], [place], [place] [address]    Special Requests   Final    BOTTLES DRAWN AEROBIC AND ANAEROBIC Blood Culture adequate volume Performed at [facility] [address], [place], [place] [address]    Culture   Final    NO GROWTH 3 DAYS Performed at [facility], [place] [address], [place], [place] [address]    Report Status PENDING  Incomplete  Blood culture (routine x 2)     Status: None (Preliminary result)   Collection Time: 09/22/18  8:56 AM  Result Value Ref Range Status   Specimen Description   Final    BLOOD RIGHT ANTECUBITAL Performed at [facility] [address], [place], [place] [address]    Special Requests   Final    BOTTLES DRAWN AEROBIC AND ANAEROBIC Blood Culture results may not be optimal due to an excessive volume of blood received in culture bottles Performed at [facility] [address] [place], [place] [address]    Culture   Final    NO GROWTH 3 DAYS Performed at [facility], [place] [address], [place],  [address]    Report Status PENDING  Incomplete  Aerobic/Anaerobic Culture (surgical/deep wound)     Status: None (Preliminary result)   Collection Time: 09/22/18  2:31 PM  Result Value Ref Range Status   Specimen Description   Final    ABSCESS LEFT  GLUTEAL Performed at [facility] [address] [place],  [address]    Special Requests   Final    NONE Performed at [facility], [place] [address], [place], [place] [address]    Gram Stain   Final    MODERATE WBC PRESENT,BOTH PMN AND MONONUCLEAR NO ORGANISMS SEEN    Culture  Final    CULTURE REINCUBATED FOR BETTER GROWTH Performed at [facility], [place] [address] [place], [place] [address]    Report Status PENDING  Incomplete  Acid Fast Smear (AFB)     Status: None   Collection Time: 09/22/18  2:31 PM  Result Value Ref Range Status   AFB Specimen Processing Concentration  Final   Acid Fast Smear Negative  Final    Comment: (NOTE) Performed At: [facility] [place], [place] [id number] Rush Farmer MD YP:[id number]    Source (AFB) ABSCESS  Final    Comment: LEFT GLUTEAL Performed at [facility], [place] [address] [place], [place] [address]       Radiology Studies: No results found.   Scheduled Meds: . acetaminophen  1,000 mg Oral Q6H  . enoxaparin (LOVENOX) injection  40 mg Subcutaneous Q24H  . levofloxacin  750 mg Oral Daily  . linezolid  600 mg Oral Q12H  . nicotine  7 mg Transdermal Daily   Continuous Infusions: . imipenem-cilastatin 500 mg (09/25/18 1117)     LOS: 3 days   Time Spent in minutes   30 minutes   Loann Chahal D.O. on 09/25/2018 at 11:59 AM  Between 7am to 7pm - Please see pager noted on [email or web address]  After 7pm go to [email or web address]  And look for the night coverage person covering for me after hours  [facility] Office  [phone]

## 2018-09-25 NOTE — Progress Notes (Signed)
Pharmacy Antibiotic Note  Justin Baldwin is a 42 y.o. male admitted on 09/22/2018 with medical history significant for Hidradenitis (previously on Humira) with several weeks of painful mass on left buttocks. Found to have abscess growing mycobacterium fortuitum.  Pharmacy has been consulted for primaxin dosing.  Plan: Continue primaxin 500mg  IV q6h along with Levaquin 750 mg daily and Zyvox 600 mg q 12h per ID Follow renal function, cultures, and clinical course  Height: 6' (182.9 cm) Weight: 235 lb (106.6 kg) IBW/kg (Calculated) : 77.6  Temp (24hrs), Avg:99.8 F (37.7 C), Min:98.4 F (36.9 C), Max:101.3 F (38.5 C)  Recent Labs  Lab 09/22/18 0828 09/23/18 0513 09/24/18 0347 09/25/18 0352  WBC 13.7* 8.8 12.0*  --   CREATININE 1.36* 1.26* 1.28* 1.28*    Estimated Creatinine Clearance: 94.9 mL/min (A) (by C-G formula based on SCr of 1.28 mg/dL (H)).    No Known Allergies  Antimicrobials this admission: 11/29 zosyn x1 11/29 primaxin >> 11/29 Levaquin >> 11/30 Bactrim x 2 12/1 Zyvox >>   Dose adjustments this admission:   Microbiology results: 11/29 abscess: ngtd 11/29 BCx: ngtd  Thank you for allowing pharmacy to be a part of this patient's care.  Ulice Dash, PharmD Clinical Pharmacist Pager # [phone]  09/25/2018, 1:35 PM

## 2018-09-26 ENCOUNTER — Inpatient Hospital Stay: Payer: Self-pay

## 2018-09-26 DIAGNOSIS — A319 Mycobacterial infection, unspecified: Secondary | ICD-10-CM

## 2018-09-26 MED ORDER — POLYETHYLENE GLYCOL 3350 17 G PO PACK
17.0000 g | PACK | Freq: Every day | ORAL | Status: DC
  Administered 2018-09-26 – 2018-09-27 (×2): 17 g via ORAL
  Filled 2018-09-26 (×2): qty 1

## 2018-09-26 MED ORDER — SODIUM CHLORIDE 0.9 % IV SOLN
1000.0000 mg | Freq: Three times a day (TID) | INTRAVENOUS | Status: DC
  Administered 2018-09-26 – 2018-09-27 (×3): 1000 mg via INTRAVENOUS
  Filled 2018-09-26 (×4): qty 1000

## 2018-09-26 MED ORDER — LINEZOLID 600 MG PO TABS: 600.0000 mg | ORAL_TABLET | Freq: Every day | ORAL | 0 refills | Status: DC

## 2018-09-26 MED ORDER — DIPHENHYDRAMINE HCL 25 MG PO CAPS
25.0000 mg | ORAL_CAPSULE | Freq: Four times a day (QID) | ORAL | Status: DC | PRN
  Administered 2018-09-26: 25 mg via ORAL
  Filled 2018-09-26: qty 1

## 2018-09-26 NOTE — Progress Notes (Addendum)
PROGRESS NOTE    Justin Baldwin  VZD:[id number] DOB: 24-Dec-1975 DOA: 09/22/2018 PCP: Justin [name], NP   Brief Narrative:  HPI on 09/22/2018 by Dr. Oretha Milch Justin Baldwin is a 42 y.o. male with medical history significant for Hidradenitis (previously on Humira) who presents on 09/22/2018 with several weeks of painful mass on left buttocks found to be left buttocks abscess on 11/7 on bactrim x 21 days with failed outpatient treatment with persistent fevers and pain.   He reports 2 months ago hitting himself with a dumbbell where he noticed a lump. It was initially thought to be a hematoma/deep tissue injury. There was then a concern for infection because it didn't go down in size.  11/7 evaluated by PCP who obtain CT abdomen which showed superficial soft tissue abscess of left buttocks with adjacent cellulitis  prompting initiation of bactrim x 2weeks .  Of note prior to that, he was on doxycycline x 14 days prescribed at an urgent care visit for an "inflamed ankle"  He was then evaluated by [facility] surgery on 11/8 where he was recommended to continue Bactrim for additional 2 weeks.  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  Given the growth of his right organism and persistent fevers surgery and outpatient referral to ID was obtained. He reports having a fever for "19 straight days" its usually 100.5 with a max of 104, he'll have subjective chills and sweats. No drainage from the area.    He was directed to ED because of ongoing pain, fever, and concern for worsening infection.  Hidradenitis. Stopped Humira (was on for 3 years) more than 2 months ago due to concern for potential allergic reaction.  Usually he has lesions in his chest and back but has noticed any active lesions since discontinuing  Interim history Admitted for left buttock abcess, s/p I&D by general surgery. ID consulted.   Assessment & Plan   Left  buttock loculated abscess with surrounding panniculitis and cellulitis -CT scan and pelvis showed progression of inflammatory change in the left buttocks region compared to prior CT, loculated abscess 11.1 x 3.7 x 14.5 cm. 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  Recommending daily wound changes and feels patient may likely need several days before he is able to tolerate dressing changes at home -Infectious disease consulted and appreciated, recommending imipenem plus Levaquin and Bactrim.  Upon discharge, recommended Avelox, Bactrim, Zyvox -Blood cultures show no growth to date -Continue imipenem -Bactrim and Levaquin started by infectious disease on 09/23/2018. -Continue pain control -Despite being on antibiotics, patient spiked a fever of 103 F on 09/23/2018.  Discussed with infectious disease, Dr. Tommy Medal 09/24/2018.  Will discontinue Bactrim placed on Zyvox and continue to monitor.  Patient stated that he has been having evening fevers for the past 20 days, however, when he ran out of Bactrim prior to admission, noted that his fevers subsided. -Previous wound culture sent for sensitivities, however, per ID, may take up to 1 month to return. -Discussed with patient, he will need to have his wife learn and watch his wound care. She will need to also practice -Case management consulted for [facility] wound care -Discussed with Dr. Baxter Flattery today, recommended continuing imipenem at home for 4 weeks. Discussed with patient- he would like more information from ID.  Hidradenitis -Stable, small pustules on left posterior shoulder otherwise no active lesions -  Was on Humira injections  Chronic kidney disease, stage II -Creatinine currently 1.28, appears to be at baseline -Creatinine back in 2017 was 1.4  DVT Prophylaxis Lovenox  Code Status: Full  Family Communication: None at bedside. Wife via phone    Disposition Plan: Admitted.  Suspect patient can be discharged on 09/27/2018- pending wife assisting with dressing changes.   Consultants General surgery Infectious disease  Procedures  I&D of left buttock abscess  Antibiotics   Anti-infectives (From admission, onward)   Start     Dose/Rate Route Frequency Ordered Stop   09/24/18 1200  linezolid (ZYVOX) tablet 600 mg     600 mg Oral Every 12 hours 09/24/18 1048     09/23/18 1200  sulfamethoxazole-trimethoprim (BACTRIM DS,SEPTRA DS) 800-160 MG per tablet 1 tablet  Status:  Discontinued     1 tablet Oral Every 12 hours 09/23/18 1131 09/24/18 1046   09/22/18 1800  imipenem-cilastatin (PRIMAXIN) 500 mg in sodium chloride 0.9 % 100 mL IVPB     500 mg 200 mL/hr over 30 Minutes Intravenous Every 6 hours 09/22/18 1702     09/22/18 1700  levofloxacin (LEVAQUIN) tablet 750 mg    Note to Pharmacy:  I would prefer AVELOX  But ok if cannot get it   750 mg Oral Daily 09/22/18 1640     09/22/18 1400  piperacillin-tazobactam (ZOSYN) IVPB 3.375 g     3.375 g 100 mL/hr over 30 Minutes Intravenous  Once 09/22/18 1355 09/22/18 1436   09/22/18 1309  piperacillin-tazobactam (ZOSYN) 3.375 GM/50ML IVPB    Note to Pharmacy:  [name]   : cabinet override      09/22/18 1309 09/23/18 0114      Subjective:   [name] seen and examined today.  Patient has no complaints this morning.  States that he has not used Dilaudid and has been able to tolerate his dressing changes.  Currently denies chest pain, shortness of breath, abdominal pain, nausea or vomiting, diarrhea or constipation, dizziness or headache.    Objective:   Vitals:   09/25/18 2049 09/26/18 0605 09/26/18 0610 09/26/18 0800  BP: (!) 172/78 (!) 149/81  (!) 164/66  Pulse: 72 66  (!) 55  Resp: 18 18  17   Temp: 100.3 F (37.9 C) 98.9 F (37.2 C)  98.2 F (36.8 C)  TempSrc: Oral Oral  Oral  SpO2: 99% 96%  100%  Weight:   108.9 kg 108.8 kg  Height:        Intake/Output Summary  (Last 24 hours) at 09/26/2018 1156 Last data filed at 09/26/2018 0725 Gross per 24 hour  Intake 1560 ml  Output 250 ml  Net 1310 ml   Filed Weights   09/22/18 0807 09/26/18 0610 09/26/18 0800  Weight: 106.6 kg 108.9 kg 108.8 kg   Exam  General: Well developed, well nourished, NAD, appears stated age  5: NCAT, mucous membranes moist.   Neck: Supple  Cardiovascular: S1 S2 auscultated, no murmurs, RRR  Respiratory: Clear to auscultation bilaterally with equal chest rise  Abdomen: Soft, nontender, nondistended, + bowel sounds  Extremities: warm dry without cyanosis clubbing or edema  Neuro: AAOx3, nonfocal  Skin: Multiple tattoos. Dressing on left buttock  Psych: Appropriate mood and affect, pleasant   Data Reviewed: I have personally reviewed following labs and imaging studies  CBC: Recent Labs  Lab 09/22/18 0828 09/23/18 0513 09/24/18 0347  WBC 13.7* 8.8 12.0*  NEUTROABS 8.5*  --   --   HGB 16.8 14.4 13.9  HCT 50.3 44.6 42.6  MCV 96.5 97.2 97.5  PLT 323 293 397   Basic Metabolic Panel: Recent Labs  Lab 09/22/18 0828 09/23/18 0513 09/24/18 0347 09/25/18 0352  NA 134* 135 132* 134*  K 3.8 4.2 4.1 4.0  CL 95* 101 101 103  CO2 30 28 26 24   GLUCOSE 119* 132* 124* 148*  BUN 14 16 13 16   CREATININE 1.36* 1.26* 1.28* 1.28*  CALCIUM 9.2 8.1* 7.8* 8.0*   GFR: Estimated Creatinine Clearance: 95.8 mL/min (A) (by C-G formula based on SCr of 1.28 mg/dL (H)). Liver Function Tests: Recent Labs  Lab 09/22/18 0828  AST 15  ALT 17  ALKPHOS 48  BILITOT 0.8  PROT 9.3*  ALBUMIN 4.3   No results for input(s): LIPASE, AMYLASE in the last 168 hours. No results for input(s): AMMONIA in the last 168 hours. Coagulation Profile: No results for input(s): INR, PROTIME in the last 168 hours. Cardiac Enzymes: No results for input(s): CKTOTAL, CKMB, CKMBINDEX, TROPONINI in the last 168 hours. BNP (last 3 results) No results for input(s): PROBNP in the last 8760  hours. HbA1C: No results for input(s): HGBA1C in the last 72 hours. CBG: No results for input(s): GLUCAP in the last 168 hours. Lipid Profile: No results for input(s): CHOL, HDL, LDLCALC, TRIG, CHOLHDL, LDLDIRECT in the last 72 hours. Thyroid Function Tests: No results for input(s): TSH, T4TOTAL, FREET4, T3FREE, THYROIDAB in the last 72 hours. Anemia Panel: No results for input(s): VITAMINB12, FOLATE, FERRITIN, TIBC, IRON, RETICCTPCT in the last 72 hours. Urine analysis: No results found for: COLORURINE, APPEARANCEUR, LABSPEC, PHURINE, GLUCOSEU, HGBUR, BILIRUBINUR, KETONESUR, PROTEINUR, UROBILINOGEN, NITRITE, LEUKOCYTESUR Sepsis Labs: @LABRCNTIP (procalcitonin:4,lacticidven:4)  ) Recent Results (from the past 240 hour(s))  Blood culture (routine x 2)     Status: None (Preliminary result)   Collection Time: 09/22/18  8:28 AM  Result Value Ref Range Status   Specimen Description   Final    BLOOD LEFT ANTECUBITAL Performed at [facility] [address] [place], [place] [address]    Special Requests   Final    BOTTLES DRAWN AEROBIC AND ANAEROBIC Blood Culture adequate volume Performed at [facility] [address] [place], [place] [address]    Culture   Final    NO GROWTH 3 DAYS Performed at [facility], [place] [address] [place], [place] [address]    Report Status PENDING  Incomplete  Blood culture (routine x 2)     Status: None (Preliminary result)   Collection Time: 09/22/18  8:56 AM  Result Value Ref Range Status   Specimen Description   Final    BLOOD RIGHT ANTECUBITAL Performed at [facility] [address] [place], [place] [address]    Special Requests   Final    BOTTLES DRAWN AEROBIC AND ANAEROBIC Blood Culture results may not be optimal due to an excessive volume of blood received in culture bottles Performed at [facility] [address], [place], [place] [address]    Culture    Final    NO GROWTH 3 DAYS Performed at [facility], [place] [address] [place], [place] [address]    Report Status PENDING  Incomplete  Aerobic/Anaerobic Culture (surgical/deep wound)     Status: None (Preliminary result)   Collection Time: 09/22/18  2:31 PM  Result Value Ref Range Status   Specimen Description   Final    ABSCESS LEFT GLUTEAL Performed at [facility], [address] [place], [place] [address]    Special Requests   Final    NONE Performed at [facility], [place] [address], [place], [place] [address]    Gram Stain   Final    MODERATE WBC PRESENT,BOTH PMN AND MONONUCLEAR NO ORGANISMS SEEN    Culture   Final    CULTURE REINCUBATED FOR BETTER GROWTH Performed at [facility], [place] [address], [place], [place] [address]    Report Status PENDING  Incomplete  Acid Fast Smear (AFB)     Status: None   Collection Time: 09/22/18  2:31 PM  Result Value Ref Range Status   AFB Specimen Processing Concentration  Final   Acid Fast Smear Negative  Final    Comment: (NOTE) Performed At: [facility] [place], [place] [id number] Rush Farmer MD XL:[id number]    Source (AFB) ABSCESS  Final    Comment: LEFT GLUTEAL Performed at [facility], [place] [address] [place], [place] [address]       Radiology Studies: No results found.   Scheduled Meds: . acetaminophen  1,000 mg Oral Q6H  . enoxaparin (LOVENOX) injection  40 mg Subcutaneous Q24H  . levofloxacin  750 mg Oral Daily  . linezolid  600 mg Oral Q12H  . nicotine  7 mg Transdermal Daily  . polyethylene glycol  17 g Oral Daily   Continuous Infusions: . imipenem-cilastatin Stopped (09/26/18 0700)     LOS: 4 days   Time Spent in minutes   30 minutes   Trevel Dillenbeck D.O. on 09/26/2018 at 11:56 AM  Between 7am to 7pm - Please see pager noted on [email or web address]  After 7pm go to [email or web address]  And look for the night coverage  person covering for me after hours  [facility] Office  [phone]

## 2018-09-26 NOTE — Progress Notes (Signed)
Patient ID: Justin Baldwin, male   DOB: September 01, 1976, 42 y.o.   MRN: [id number]    4 Days Post-Op  Subjective: Patient with no new complaints today.  Itching is better.  Objective: Vital signs in last 24 hours: Temp:  [98 F (36.7 C)-100.3 F (37.9 C)] 98.2 F (36.8 C) (12/03 0800) Pulse Rate:  [55-72] 55 (12/03 0800) Resp:  [17-18] 17 (12/03 0800) BP: (143-172)/(66-81) 164/66 (12/03 0800) SpO2:  [96 %-100 %] 100 % (12/03 0800) Weight:  [108.8 kg-108.9 kg] 108.8 kg (12/03 0800) Last BM Date: 09/22/18  Intake/Output from previous day: 12/02 0701 - 12/03 0700 In: 1660 [P.O.:1260; IV Piggyback:400] Out: 250 [Urine:250] Intake/Output this shift: Total I/O In: 120 [P.O.:120] Out: -   PE: Skin: wound is clean and dressing change done by RN and wife observed.  No further erythema or cellulitis noted around the wound. No purulent drainage.  Penrose in place.  Lab Results:  Recent Labs    09/24/18 0347  WBC 12.0*  HGB 13.9  HCT 42.6  PLT 260   BMET Recent Labs    09/24/18 0347 09/25/18 0352  NA 132* 134*  K 4.1 4.0  CL 101 103  CO2 26 24  GLUCOSE 124* 148*  BUN 13 16  CREATININE 1.28* 1.28*  CALCIUM 7.8* 8.0*   PT/INR No results for input(s): LABPROT, INR in the last 72 hours. CMP     Component Value Date/Time   NA 134 (L) 09/25/2018 0352   K 4.0 09/25/2018 0352   CL 103 09/25/2018 0352   CO2 24 09/25/2018 0352   GLUCOSE 148 (H) 09/25/2018 0352   BUN 16 09/25/2018 0352   CREATININE 1.28 (H) 09/25/2018 0352   CALCIUM 8.0 (L) 09/25/2018 0352   PROT 9.3 (H) 09/22/2018 0828   ALBUMIN 4.3 09/22/2018 0828   AST 15 09/22/2018 0828   ALT 17 09/22/2018 0828   ALKPHOS 48 09/22/2018 0828   BILITOT 0.8 09/22/2018 0828   GFRNONAA >60 09/25/2018 0352   GFRAA >60 09/25/2018 0352   Lipase  No results found for: LIPASE     Studies/Results: No results found.  Anti-infectives: Anti-infectives (From admission, onward)   Start     Dose/Rate Route Frequency Ordered  Stop   09/24/18 1200  linezolid (ZYVOX) tablet 600 mg     600 mg Oral Every 12 hours 09/24/18 1048     09/23/18 1200  sulfamethoxazole-trimethoprim (BACTRIM DS,SEPTRA DS) 800-160 MG per tablet 1 tablet  Status:  Discontinued     1 tablet Oral Every 12 hours 09/23/18 1131 09/24/18 1046   09/22/18 1800  imipenem-cilastatin (PRIMAXIN) 500 mg in sodium chloride 0.9 % 100 mL IVPB     500 mg 200 mL/hr over 30 Minutes Intravenous Every 6 hours 09/22/18 1702     09/22/18 1700  levofloxacin (LEVAQUIN) tablet 750 mg    Note to Pharmacy:  I would prefer AVELOX  But ok if cannot get it   750 mg Oral Daily 09/22/18 1640     09/22/18 1400  piperacillin-tazobactam (ZOSYN) IVPB 3.375 g     3.375 g 100 mL/hr over 30 Minutes Intravenous  Once 09/22/18 1355 09/22/18 1436   09/22/18 1309  piperacillin-tazobactam (ZOSYN) 3.375 GM/50ML IVPB    Note to Pharmacy:  [name]   : cabinet override      09/22/18 1309 09/23/18 0114       Assessment/Plan POD 4, s/p I&D of gluteal abscess -CX: mycobacterium fortuitum -fevers have essentially resolved -on multiple abx  therapy. -wound is clean and he tolerated his dressing change without dilaudid today.  Scheduled tylenol, prn oxy and robaxin -wife to learn dressing changes -wound is stable. -will need to hold humera injections until this heals -patient is surgically stable  FEN - regular VTE - Lovenox ID - primaxin, zyvox, levaquin   LOS: 4 days    Henreitta Cea , [facility] Surgery 09/26/2018, 12:43 PM Pager: [phone]

## 2018-09-26 NOTE — Progress Notes (Signed)
Pharmacy - Brief Note   Linezolid acquisition cost will be $0 per [facility] pharmacy Prescription sent for linezolid 600mg  PO daily x 23 days  Doreene Eland, PharmD, BCPS.   Work Cell: [phone] 09/26/2018 4:42 PM

## 2018-09-26 NOTE — Progress Notes (Signed)
    [facility] for Infectious Disease    Date of Admission:  09/22/2018   Total days of antibiotics 5           ID: Justin Baldwin is a 42 y.o. male with  M.fortuitum gluteal abscess s/p I x D Active Problems:   Abscess   Abscess of buttock, left   Hydradenitis   Kidney insufficiency    Subjective: Afebrile. Concern that he won't be able to work with picc line. He tolerated dressing change by surgery, wound bed looks good per their report.  Medications:  . acetaminophen  1,000 mg Oral Q6H  . enoxaparin (LOVENOX) injection  40 mg Subcutaneous Q24H  . levofloxacin  750 mg Oral Daily  . linezolid  600 mg Oral Q12H  . nicotine  7 mg Transdermal Daily  . polyethylene glycol  17 g Oral Daily    Objective: Vital signs in last 24 hours: Temp:  [98.2 F (36.8 C)-100.3 F (37.9 C)] 98.9 F (37.2 C) (12/03 1417) Pulse Rate:  [55-72] 61 (12/03 1417) Resp:  [17-18] 18 (12/03 1417) BP: (149-172)/(66-89) 156/89 (12/03 1417) SpO2:  [96 %-100 %] 100 % (12/03 1417) Weight:  [108.8 kg-108.9 kg] 108.8 kg (12/03 0800) Physical Exam  Constitutional: He is oriented to person, place, and time. He appears well-developed and well-nourished. No distress.  HENT:  Mouth/Throat: Oropharynx is clear and moist. No oropharyngeal exudate.  Cardiovascular: Normal rate, regular rhythm and normal heart sounds. Exam reveals no gallop and no friction rub.  No murmur heard.  Pulmonary/Chest: Effort normal and breath sounds normal. No respiratory distress. He has no wheezes.  Abdominal: Soft. Bowel sounds are normal. He exhibits no distension. There is no tenderness.  Lymphadenopathy:  He has no cervical adenopathy.  Neurological: He is alert and oriented to person, place, and time.  Skin: Skin is warm and dry. No rash noted. No erythema.  Psychiatric: He has a normal mood and affect. His behavior is normal.     Lab Results Recent Labs    09/24/18 0347 09/25/18 0352  WBC 12.0*  --   HGB 13.9   --   HCT 42.6  --   NA 132* 134*  K 4.1 4.0  CL 101 103  CO2 26 24  BUN 13 16  CREATININE 1.28* 1.28*   Lab Results  Component Value Date   ESRSEDRATE 51 (H) 09/23/2018    Microbiology: m.fortuitum Studies/Results: [place] Ekg Site Rite  Result Date: 09/26/2018 If Site [place] not attached, placement could not be confirmed due to current cardiac rhythm.    Assessment/Plan: M.fortuitum soft tissue infection s/p debridement = will have picc line placed so that can do imipenem 1gm IV Q 8hr plus oral levofloxacin 750mg  daily plus linezolid 600mg  daily x 4 wks ( 26 more days from today)  Will arrange opat orders and follow up in the ID clinic  Spent 35 min with patient with greater than 50% in discussing the treatment of disease process, how picc line works, and mentioned that he could work with picc line if wanted to.  Will arrange follow up in the ID clinic in 3-4 wk  [facility] for Infectious Diseases Cell: [phone] Pager: [phone]  09/26/2018, 5:01 PM

## 2018-09-26 NOTE — Progress Notes (Signed)
PHARMACY CONSULT NOTE FOR:  OUTPATIENT  PARENTERAL ANTIBIOTIC THERAPY (OPAT)  Indication: wound infection with mycobacterium fortuitum Regimen: imipenem 1gm IV q8h End date: 10/20/2018  IV antibiotic discharge orders are pended. To discharging provider:  please sign these orders via discharge navigator,  Select New Orders & click on the button choice - Manage This Unsigned Work.     Thank you for allowing pharmacy to be a part of this patient's care.  Doreene Eland, PharmD, BCPS.   Work Cell: [phone] 09/26/2018 4:25 PM

## 2018-09-27 ENCOUNTER — Ambulatory Visit: Payer: BLUE CROSS/BLUE SHIELD | Admitting: Internal Medicine

## 2018-09-27 DIAGNOSIS — L0231 Cutaneous abscess of buttock: Secondary | ICD-10-CM | POA: Diagnosis not present

## 2018-09-27 DIAGNOSIS — A319 Mycobacterial infection, unspecified: Secondary | ICD-10-CM

## 2018-09-27 LAB — CBC
HCT: 50.2 % (ref 39.0–52.0)
Hemoglobin: 16.2 g/dL (ref 13.0–17.0)
MCH: 31.8 pg (ref 26.0–34.0)
MCHC: 32.3 g/dL (ref 30.0–36.0)
MCV: 98.4 fL (ref 80.0–100.0)
NRBC: 0 % (ref 0.0–0.2)
Platelets: 255 10*3/uL (ref 150–400)
RBC: 5.1 MIL/uL (ref 4.22–5.81)
RDW: 11.7 % (ref 11.5–15.5)
WBC: 10.5 10*3/uL (ref 4.0–10.5)

## 2018-09-27 LAB — BASIC METABOLIC PANEL
Anion gap: 10 (ref 5–15)
BUN: 15 mg/dL (ref 6–20)
CO2: 25 mmol/L (ref 22–32)
Calcium: 8.6 mg/dL — ABNORMAL LOW (ref 8.9–10.3)
Chloride: 102 mmol/L (ref 98–111)
Creatinine, Ser: 1.26 mg/dL — ABNORMAL HIGH (ref 0.61–1.24)
GFR calc Af Amer: >60 mL/min (ref >60)
GFR calc non Af Amer: >60 mL/min (ref >60)
Glucose, Bld: 116 mg/dL — ABNORMAL HIGH (ref 70–99)
Potassium: 4.5 mmol/L (ref 3.5–5.1)
Sodium: 137 mmol/L (ref 135–145)

## 2018-09-27 LAB — CULTURE, BLOOD (ROUTINE X 2)
Culture: NO GROWTH
Culture: NO GROWTH
SPECIAL REQUESTS: ADEQUATE

## 2018-09-27 MED ORDER — ACETAMINOPHEN 500 MG PO TABS: 1000.0000 mg | ORAL_TABLET | Freq: Four times a day (QID) | ORAL | 0 refills | Status: DC | PRN

## 2018-09-27 MED ORDER — OXYCODONE HCL 5 MG PO TABS: 5.0000 mg | ORAL_TABLET | Freq: Four times a day (QID) | ORAL | 0 refills | Status: DC | PRN

## 2018-09-27 MED ORDER — LIP MEDEX EX OINT
TOPICAL_OINTMENT | CUTANEOUS | Status: AC
  Administered 2018-09-27: 11:00:00
  Filled 2018-09-27: qty 7

## 2018-09-27 MED ORDER — METHOCARBAMOL 500 MG PO TABS: 500.0000 mg | ORAL_TABLET | Freq: Three times a day (TID) | ORAL | 0 refills | Status: DC | PRN

## 2018-09-27 MED ORDER — LEVOFLOXACIN 750 MG PO TABS: 750.0000 mg | ORAL_TABLET | Freq: Every day | ORAL | 0 refills | Status: AC

## 2018-09-27 MED ORDER — SODIUM CHLORIDE 0.9% FLUSH: 10.0000 mL | INTRAVENOUS | Status: DC | PRN

## 2018-09-27 MED ORDER — IMIPENEM-CILASTATIN IV (FOR PTA / DISCHARGE USE ONLY): 1000.0000 mg | Freq: Three times a day (TID) | INTRAVENOUS | 0 refills | Status: AC

## 2018-09-27 NOTE — Progress Notes (Signed)
Patient ID: Justin Baldwin, male   DOB: Nov 24, 1975, 42 y.o.   MRN: [id number] .    5 Days Post-Op  Subjective: Patient with no new complaints.  Ready to go home.  Objective: Vital signs in last 24 hours: Temp:  [98.2 F (36.8 C)-100.2 F (37.9 C)] 98.2 F (36.8 C) (12/04 0518) Pulse Rate:  [57-75] 57 (12/04 0518) Resp:  [16-18] 16 (12/04 0518) BP: (124-187)/(69-89) 124/69 (12/04 0518) SpO2:  [96 %-100 %] 96 % (12/04 0518) Weight:  [106.3 kg] 106.3 kg (12/04 0518) Last BM Date: 09/22/18  Intake/Output from previous day: 12/03 0701 - 12/04 0700 In: 1491.5 [P.O.:980; IV Piggyback:511.5] Out: -  Intake/Output this shift: No intake/output data recorded.  PE: Skin: wound is stable and packing and dressing in place.  Lab Results:  Recent Labs    09/27/18 0359  WBC 10.5  HGB 16.2  HCT 50.2  PLT 255   BMET Recent Labs    09/25/18 0352 09/27/18 0359  NA 134* 137  K 4.0 4.5  CL 103 102  CO2 24 25  GLUCOSE 148* 116*  BUN 16 15  CREATININE 1.28* 1.26*  CALCIUM 8.0* 8.6*   PT/INR No results for input(s): LABPROT, INR in the last 72 hours. CMP     Component Value Date/Time   NA 137 09/27/2018 0359   K 4.5 09/27/2018 0359   CL 102 09/27/2018 0359   CO2 25 09/27/2018 0359   GLUCOSE 116 (H) 09/27/2018 0359   BUN 15 09/27/2018 0359   CREATININE 1.26 (H) 09/27/2018 0359   CALCIUM 8.6 (L) 09/27/2018 0359   PROT 9.3 (H) 09/22/2018 0828   ALBUMIN 4.3 09/22/2018 0828   AST 15 09/22/2018 0828   ALT 17 09/22/2018 0828   ALKPHOS 48 09/22/2018 0828   BILITOT 0.8 09/22/2018 0828   GFRNONAA >60 09/27/2018 0359   GFRAA >60 09/27/2018 0359   Lipase  No results found for: LIPASE     Studies/Results: [place] Ekg Site Rite  Result Date: 09/26/2018 If Site Rite image not attached, placement could not be confirmed due to current cardiac rhythm.   Anti-infectives: Anti-infectives (From admission, onward)   Start     Dose/Rate Route Frequency Ordered Stop   09/27/18 0000   linezolid (ZYVOX) 600 MG tablet     600 mg Oral Daily 09/26/18 1628     09/26/18 2200  imipenem-cilastatin (PRIMAXIN) 1,000 mg in sodium chloride 0.9 % 250 mL IVPB     1,000 mg 250 mL/hr over 60 Minutes Intravenous Every 8 hours 09/26/18 1557     09/24/18 1200  linezolid (ZYVOX) tablet 600 mg     600 mg Oral Every 12 hours 09/24/18 1048     09/23/18 1200  sulfamethoxazole-trimethoprim (BACTRIM DS,SEPTRA DS) 800-160 MG per tablet 1 tablet  Status:  Discontinued     1 tablet Oral Every 12 hours 09/23/18 1131 09/24/18 1046   09/22/18 1800  imipenem-cilastatin (PRIMAXIN) 500 mg in sodium chloride 0.9 % 100 mL IVPB  Status:  Discontinued     500 mg 200 mL/hr over 30 Minutes Intravenous Every 6 hours 09/22/18 1702 09/26/18 1557   09/22/18 1700  levofloxacin (LEVAQUIN) tablet 750 mg    Note to Pharmacy:  I would prefer AVELOX  But ok if cannot get it   750 mg Oral Daily 09/22/18 1640     09/22/18 1400  piperacillin-tazobactam (ZOSYN) IVPB 3.375 g     3.375 g 100 mL/hr over 30 Minutes Intravenous  Once 09/22/18 1355  09/22/18 1436   09/22/18 1309  piperacillin-tazobactam (ZOSYN) 3.375 GM/50ML IVPB    Note to Pharmacy:  [name]   : cabinet override      09/22/18 1309 09/23/18 0114       Assessment/Plan POD 5, s/p I&D of gluteal abscess -CX: mycobacterium fortuitum -fevers have essentially resolved -on multiple abx therapy. -wound is clean and he tolerated his dressing change without dilaudid today. Scheduled tylenol, prn oxy and robaxin -wife to learn dressing changes -wound is stable. -will need to hold humera injections until this heals -patient is surgically stable -follow up with our office in 2 weeks for a wound check  FEN -regular VTE -Lovenox ID -primaxin, zyvox, levaquin   LOS: 5 days    Henreitta Cea , [facility] Surgery 09/27/2018, 10:48 AM Pager: [phone]

## 2018-09-27 NOTE — Discharge Instructions (Signed)
You may shower.  Remove packing in the shower and let the water clean out your wound.  Repack your wound when you have gotten out of the shower.  Dressing change only needs to be once a day at home.  Penrose drain is sutured in place and should remain.

## 2018-09-27 NOTE — Care Management Note (Signed)
Case Management Note  Patient Details  Name: KO BARDON MRN: [id number] Date of Birth: June 17, 1976  Subjective/Objective:  For PICC today. Long term iv abx, & HHRN-dsg changes,wound packing-[facility] rep Karen/Pam(iv infusion) aware of d/c & HHC services. No further CM needs.                  Action/Plan:dc home w/HHC.   Expected Discharge Date:  (unknown)               Expected Discharge Plan:  [place]  In-House Referral:     Discharge planning Services  CM Consult  Post Acute Care Choice:    Choice offered to:  Patient  DME Arranged:    DME Agency:     HH Arranged:  RN, IV Antibiotics HH Agency:  [facility]  Status of Service:  Completed, signed off  If discussed at [place] of Stay Meetings, dates discussed:    Additional Comments:  Dessa Phi, RN 09/27/2018, 11:25 AM

## 2018-09-27 NOTE — Progress Notes (Signed)
Discharge instructions discussed with patient and family,verbalized agreement and understanding, prescriptions given to patient.  Instructed wife on wound care with return demonsttrations

## 2018-09-27 NOTE — Progress Notes (Signed)
Peripherally Inserted Central Catheter/Midline Placement  The IV Nurse has discussed with the patient and/or persons authorized to consent for the patient, the purpose of this procedure and the potential benefits and risks involved with this procedure.  The benefits include less needle sticks, lab draws from the catheter, and the patient may be discharged home with the catheter. Risks include, but not limited to, infection, bleeding, blood clot (thrombus formation), and puncture of an artery; nerve damage and irregular heartbeat and possibility to perform a PICC exchange if needed/ordered by physician.  Alternatives to this procedure were also discussed.  Bard Power PICC patient education guide, fact sheet on infection prevention and patient information card has been provided to patient /or left at bedside.    PICC/Midline Placement Documentation        Darlyn Read 09/27/2018, 3:46 PM

## 2018-09-27 NOTE — Discharge Summary (Signed)
Physician Discharge Summary  AXZEL ROCKHILL VQX:[id number] DOB: 07-Dec-1975 DOA: 09/22/2018  PCP: Pleas [name], NP  Admit date: 09/22/2018 Discharge date: 09/28/2018  Time spent: 35 minutes  Recommendations for Outpatient Follow-up:  1. Follow up outpatient CBC/CMP 2. Ensure follow up with surgery and infectious disease 3. Continue dressing changes  4. Continue abx course 5. Of note, pt with wedge shaped area of decreased attentuation in mid left kidney concerning for lobar nephronia/nephritis.  Follow up UA/BMP, consider repeat imaging as outpatient (discussed with pt) 6. Remove PICC when abx complete   Discharge Diagnoses:  Active Problems:   Abscess   Abscess of buttock, left   Hydradenitis   Kidney insufficiency   Atypical mycobacterium infection   Discharge Condition: stable  Diet recommendation: heart healthy  Filed Weights   09/26/18 0610 09/26/18 0800 09/27/18 0518  Weight: 108.9 kg 108.8 kg 106.3 kg    History of present illness:  HPI on 09/22/2018 by Dr. Ty Hilts Careyis a 42 y.o.malewith medical history significant for Hidradenitis (previously on Humira) who presents on 11/29/2019with several weeks of painful mass on left buttocks found to be left buttocks abscess on 11/7 on bactrim x 21 days with failed outpatient treatment with persistent fevers and pain.  He reports 2 months ago hitting himself with a dumbbell where he noticed a lump. It was initially thought to be a hematoma/deep tissue injury. There was then a concern for infection because it didn't go down in size. 11/7 evaluated by PCP who obtain CT abdomen which showed superficial soft tissue abscess of left buttocks with adjacent cellulitis prompting initiation of bactrim x 2weeks . Of note prior to that, he was on doxycycline x 14 days prescribed at an urgent care visit for an "inflamed ankle" He was then evaluated by [facility] surgery on 11/8 where he was recommended to  continue Bactrim for additional 2 weeks. 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 Given the growth of his right organism and persistent fevers surgery and outpatient referral to ID was obtained. He reports having a fever for "19 straight days" its usually 100.5 with a max of 104, he'll have subjective chills and sweats. No drainage from the area.  He was directed to ED because of ongoing pain, fever, and concern for worsening infection.  Hidradenitis. Stopped Humira (was on for 3 years) more than 2 months ago due to concern for potential allergic reaction. Usually he has lesions in his chest and back but has noticed any active lesions since discontinuing  He was admitted for a loculated abscess in the L buttocks.  This had previously grown mycobacterium fortuitum in previous aspiration.  He was seen by surgery who I&D'd abscess.  ID following and recommended imipenem, levaquin, and linezolid x 4 weeks.  See below for additional details Hospital Course:  Left buttock loculated abscess with surrounding panniculitis and cellulitis -CT scan and pelvis showed progression of inflammatory change in the left buttocks region compared to prior CT, loculated abscess 11.1 x 3.7 x 14.5 cm. -Patient had aspiration 09/13/2018 which grew Mycobacterium fortuitum  -General surgery consulted and appreciated, status post I&D -Infectious disease consulted and appreciated, recommending imipenem plus Levaquin and Bactrim.   -Blood cultures show no growth to date - wound cx pending (acid fast cx, AFB, cx) suspicion for drug fevers on bactrim  -Previous wound culture sent for sensitivities, however, per ID, may take up to 1 month to return. -Case  management consulted for [facility] wound care  Hidradenitis -Stable, small pustules on left posterior shoulder otherwise no active lesions -Was on Humira injections  Chronic kidney disease, stage  II -Creatinine currently 1.28, appears to be at baseline -Creatinine back in 2017 was 1.4  Abnormal L Kidney on imaging: concern for nephronia/nephritis on imaging.  Pt renal fx stable.  No UA from this hospitalization.  Discussed f/u outpatient with renal function and UA and possible repeat imaging.  Procedures: PICC 12/4 I&D 11/29  Consultations:  ID  Surgery  Discharge Exam: Vitals:   09/27/18 0518 09/27/18 1343  BP: 124/69 124/63  Pulse: (!) 57 (!) 57  Resp: 16 17  Temp: 98.2 F (36.8 C) (!) 97.5 F (36.4 C)  SpO2: 96% 98%   Feeling ok Ready for discharge Wife at bedside  General: No acute distress. Cardiovascular: Heart sounds show Shepherd Finnan regular rate, and rhythm Lungs: Clear to auscultation bilaterally Abdomen: Soft, nontender, nondistended  No cva tenderness Neurological: Alert and oriented 3. Moves all extremities 4. Cranial nerves II through XII grossly intact. Skin: L buttocks with penrose drain, wound appears clean  Extremities: No clubbing or cyanosis. No edema.  Psychiatric: Mood and affect are normal. Insight and judgment are appropriate.  Discharge Instructions   Discharge Instructions    Call MD for:  difficulty breathing, headache or visual disturbances   Complete by:  As directed    Call MD for:  extreme fatigue   Complete by:  As directed    Call MD for:  hives   Complete by:  As directed    Call MD for:  persistant dizziness or light-headedness   Complete by:  As directed    Call MD for:  persistant nausea and vomiting   Complete by:  As directed    Call MD for:  redness, tenderness, or signs of infection (pain, swelling, redness, odor or green/yellow discharge around incision site)   Complete by:  As directed    Call MD for:  severe uncontrolled pain   Complete by:  As directed    Call MD for:  temperature >100.4   Complete by:  As directed    Diet - low sodium heart healthy   Complete by:  As directed    Discharge instructions    Complete by:  As directed    You were seen for Jenni Thew left buttock abscess.  This has improved after incision and drainage and antibiotics.  You will be discharged home with Leylanie Woodmansee prolonged course of oral and intravenous antibiotics.  Please follow up with surgery and infectious disease as scheduled.    You had some abnormal findings to your left kidney on imaging, please follow up with your PCP regarding this and follow up an outpatient urinalysis and kidney function test.  Please follow up with your PCP within Vinisha Faxon few days.  Continue wound care as directed.  Return for new, recurrent, or worsening symptoms.  Please ask your PCP to request records from this hospitalization so they know what was done and what the next steps will be.   Discharge wound care:   Complete by:  As directed    1.  Remove cover dressing & packing.  May moisten to help remove. 2.  Wash wound gently with soap & warm tap water 3.  Repack wound with lightly normal saline moistened gauze 4.  Cover with dry dressing (gauze or ABD pads) & paper tape.   Home infusion instructions [facility] May follow [place]  Dosing Protocol; May administer Cathflo as needed to maintain patency of vascular access device.; Flushing of vascular access device: per [facility] Protocol: 0.9% NaCl pre/post medica...   Complete by:  As directed    Instructions:  May follow [place] Dosing Protocol   Instructions:  May administer Cathflo as needed to maintain patency of vascular access device.   Instructions:  Flushing of vascular access device: per [facility] Protocol: 0.9% NaCl pre/post medication administration and prn patency; Heparin 100 u/ml, 69m for implanted ports and Heparin 10u/ml, 578mfor all other central venous catheters.   Instructions:  May follow [facility] Anaphylaxis Protocol for First Dose Administration in the home: 0.9% NaCl at 25-50 ml/hr to maintain IV access for protocol meds. Epinephrine 0.3 ml IV/IM PRN and Benadryl 25-50 IV/IM PRN  s/s of anaphylaxis.   Instructions:  [facility] Coordinator (RN) to assist per patient IV care needs in the home PRN.   Increase activity slowly   Complete by:  As directed      Allergies as of 09/27/2018   No Known Allergies     Medication List    STOP taking these medications   sulfamethoxazole-trimethoprim 800-160 MG tablet Commonly known as:  BACTRIM DS,SEPTRA DS     TAKE these medications   acetaminophen 500 MG tablet Commonly known as:  TYLENOL Take 2 tablets (1,000 mg total) by mouth every 6 (six) hours as needed. What changed:    medication strength  how much to take  reasons to take this   imipenem-cilastatin  IVPB Commonly known as:  PRIMAXIN Inject 1,000 mg into the vein every 8 (eight) hours for 23 days. Indication:  Wound infection with mycobacterium fortuitum Last Day of Therapy:  10/20/2018 Labs - Once weekly:  CBC/D and BMP, Labs - Every other week:  ESR and CRP   levofloxacin 750 MG tablet Commonly known as:  LEVAQUIN Take 1 tablet (750 mg total) by mouth daily for 23 days.   linezolid 600 MG tablet Commonly known as:  ZYVOX Take 1 tablet (600 mg total) by mouth daily.   methocarbamol 500 MG tablet Commonly known as:  ROBAXIN Take 1 tablet (500 mg total) by mouth every 8 (eight) hours as needed for muscle spasms.   oxyCODONE 5 MG immediate release tablet Commonly known as:  Oxy IR/ROXICODONE Take 1-2 tablets (5-10 mg total) by mouth every 6 (six) hours as needed for moderate pain.   oxymetazoline 0.05 % nasal spray Commonly known as:  AFRIN Place 1-2 sprays into both nostrils as needed for congestion.            Home Infusion Instuctions  (From admission, onward)         Start     Ordered   09/27/18 0000  Home infusion instructions [facility] May follow [facility] Protocol; May administer Cathflo as needed to maintain patency of vascular access device.; Flushing of vascular access device: [facility]: 0.9% NaCl pre/post medica...    Question Answer Comment  Instructions May follow [facility] Protocol   Instructions May administer Cathflo as needed to maintain patency of vascular access device.   Instructions Flushing of vascular access device: per [facility]: 0.9% NaCl pre/post medication administration and prn patency; Heparin 100 u/ml, 62m68mor implanted ports and Heparin 10u/ml, 62ml39mr all other central venous catheters.   Instructions May follow [facility] Anaphylaxis Protocol for First Dose Administration in the home: 0.9% NaCl at 25-50 ml/hr to maintain IV  access for protocol meds. Epinephrine 0.3 ml IV/IM PRN and Benadryl 25-50 IV/IM PRN s/s of anaphylaxis.   Instructions [facility] Infusion Coordinator (RN) to assist per patient IV care needs in the home PRN.      09/27/18 1637           Discharge Care Instructions  (From admission, onward)         Start     Ordered   09/27/18 0000  Discharge wound care:    Comments:  1.  Remove cover dressing & packing.  May moisten to help remove. 2.  Wash wound gently with soap & warm tap water 3.  Repack wound with lightly normal saline moistened gauze 4.  Cover with dry dressing (gauze or ABD pads) & paper tape.   09/27/18 1641         No Known Allergies Follow-up Information    Comer, Okey Regal, MD Follow up.   Specialty:  Infectious Diseases Why:  12/23 @ 9:15 am. Please call office to reschedule if you are not able to make appointment.  Contact information: [address] [address] [phone]        Health, [facility] Follow up.   Specialty:  Home Health Services Why:  [facility] nursing Contact information: [place] [address] [phone]        Surgery, [place] Follow up on 10/12/2018.   Specialty:  General Surgery Why:  2:00pm, arrive by 1:30pm for paperwork and check in.  This appointment is for your wound follow up Contact  information: [address] [place] [address] [phone]        Pleas [name], NP Follow up.   Specialty:  Internal Medicine Contact information: [place] [address] [phone]            The results of significant diagnostics from this hospitalization (including imaging, microbiology, ancillary and laboratory) are listed below for reference.    Significant Diagnostic Studies: US Guided Needle Placement  Result Date: 09/13/2018 INDICATION: 42 year old with cellulitis in the left buttock. Evaluate for subcutaneous fluid collection and aspiration. Patient is having fevers despite being on antibiotics. EXAM: ULTRASOUND-GUIDED ASPIRATION OF LEFT BUTTOCK SUBCUTANEOUS FLUID MEDICATIONS: None ANESTHESIA/SEDATION: None COMPLICATIONS: None immediate. PROCEDURE: The procedure was explained to the patient. The risks and benefits of the procedure were discussed and the patient's questions were addressed. Informed consent was obtained from the patient. The left buttock was evaluated with ultrasound. Extensive subcutaneous edema identified with ultrasound. Small amount of fluid was identified. The skin was prepped with chlorhexidine and sterile field was created. Skin and soft tissues anesthetized with 1% lidocaine. Yueh catheter directed into the fluid collection with ultrasound guidance. 25 mL of cloudy red fluid was removed. Needle was directed into other areas but no additional fluid could be aspirated. Catheter was removed. Bandage placed over the puncture site. FINDINGS: Subcutaneous edema in the left buttock. Small subcutaneous fluid collection was targeted and 25 mL of bloody cloudy fluid was removed. IMPRESSION: Ultrasound-guided aspiration of cloudy bloody fluid from the left buttock subcutaneous tissues. Fluid sent for culture. Electronically Signed   By: Markus Daft M.D.   On: 09/13/2018 18:39   Ct Abdomen Pelvis W Contrast  Result Date:  09/22/2018 CLINICAL DATA:  Left lower abdominal/pelvic pain EXAM: CT ABDOMEN AND PELVIS WITH CONTRAST TECHNIQUE: Multidetector CT imaging of the abdomen and pelvis was performed using the standard protocol following  bolus administration of intravenous contrast. CONTRAST:  117m ISOVUE-300 IOPAMIDOL (ISOVUE-300) INJECTION 61% COMPARISON:  August 31, 2018 FINDINGS: Lower chest: Lung bases are clear. Hepatobiliary: No focal liver lesions are appreciable. Gallbladder wall is not appreciably thickened. There is no biliary duct dilatation. Pancreas: No pancreatic mass or inflammatory focus. Spleen: No splenic lesions are evident. Adrenals/Urinary Tract: Adrenals bilaterally appear unremarkable. There is again noted Egan Berkheimer wedge-shaped area of decreased attenuation in the lateral mid left kidney, unchanged from recent CT. No well-defined mass seen in either kidney. No hydronephrosis evident on either side. No renal or ureteral calculus evident on either side. Urinary bladder is midline with wall thickness within normal limits. Stomach/Bowel: There are occasional sigmoid diverticula without diverticulitis. There is no appreciable bowel wall or mesenteric thickening. No bowel obstruction. No free air or portal venous air. Vascular/Lymphatic: No abdominal aortic aneurysm. No appreciable vascular lesions evident. No adenopathy appreciable in the abdomen or pelvis. Reproductive: Prostate and seminal vesicles are normal in size and contour. There is no evident pelvic mass. Other: Appendix appears unremarkable. No abscess or ascites is noted within the peritoneum or retroperitoneal regions. There is Nickolas Chalfin rather minimal ventral hernia containing only fat. Musculoskeletal: There is Elpidio Thielen loculated abscess in the left buttocks region which abuts the gluteus maximus muscle in its mid to lateral aspects. This abscess currently measures 11.1 x 3.7 x 14.5 cm. There is overlying cellulitis and adjacent panniculitis in the fat between the skin  surface and in the gluteus maximus muscle. There is milder edema in the soft tissues on the right in the buttocks region adjacent to the gluteus maximus muscle without well-defined fluid. No abscess is seen in this area. There are foci of degenerative change in the lumbar spine. There are no blastic or lytic bone lesions. IMPRESSION: 1. Progression of inflammatory change in the left buttocks region compared to prior CT. There is Rafeef Lau loculated abscess in this area currently measuring 11.1 x 3.7 x 14.5 cm. Specifically, this loculated abscess abuts the gluteus maximus muscle posterolaterally. There are areas which suggest early muscle invasion from this loculated abscess. There is adjacent panniculitis and cellulitis posterolaterally in the left lower pelvic/upper thigh regions. 2. On the right in the posterior buttocks region adjacent to the gluteus maximus muscle, there is soft tissue thickening but no frank abscess or fluid. 3. Avari Gelles small wedge-shaped area of decreased attenuation in the mid left kidney laterally is again noted, stable, Moody Robben finding that could represent localized lobar nephronia/acute bacterial nephritis. No surrounding perinephric stranding or fluid. No hydronephrosis on either side. No renal or ureteral calculus on either side. 4. Occasional sigmoid diverticula without diverticulitis. No bowel obstruction. No abscess within the peritoneum or retroperitoneum. Appendix appears normal. Electronically Signed   By: WLowella GripIII M.D.   On: 09/22/2018 10:02   Ct Abdomen Pelvis W Contrast  Result Date: 08/31/2018 CLINICAL DATA:  Knot the left buttocks/hip for 5 weeks, becoming more painful, now with fever EXAM: CT ABDOMEN AND PELVIS WITH CONTRAST TECHNIQUE: Multidetector CT imaging of the abdomen and pelvis was performed using the standard protocol following bolus administration of intravenous contrast. CONTRAST:  1071mOMNIPAQUE IOHEXOL 300 MG/ML  SOLN COMPARISON:  None. FINDINGS: Lower chest: The  lung bases are clear. The heart is within normal limits in size. Hepatobiliary: The parenchyma of the liver is unremarkable. No calcified gallstones are seen within the somewhat decompressed gallbladder. Pancreas: The pancreas is normal in size and the pancreatic duct is not dilated. Spleen: The spleen is  unremarkable. Adrenals/Urinary Tract: The adrenal glands appear normal. The kidneys enhance with no calculus or mass. On delayed images, the pelvocaliceal systems are unremarkable. A peripheral wedge-shaped opacity in the posterior left mid kidney on image 14 series 7 probably is artifactual, but a focal pyelonephritis would be difficult to exclude. The ureters are normal in caliber. The urinary bladder is not well distended but no abnormality is seen. Stomach/Bowel: The stomach is largely decompressed. No small bowel dilatation or edema is seen. The rectosigmoid colon is very elongated and tortuous with a few scattered diverticula noted. There is a moderate amount of feces throughout the colon. The terminal ileum is unremarkable. The appendix is unremarkable as well with no inflammatory process noted. Vascular/Lymphatic: The abdominal aorta is normal in caliber with no adenopathy present. Only small retroperitoneal nodes are noted. Reproductive: The prostate is within normal limits in size for age. Other: Within the soft tissues of the left buttocks there is soft tissue strandiness consistent with cellulitis. Also there are low-attenuation fluid collections just superficial to the musculature of the gluteus maximus muscle consistent with small abscesses. There may be some involvement of the superficial aspect of the lateral left gluteus maximus musculature. No deeper extension of these probable abscesses is seen. Similar but smaller areas are noted within the right buttocks as well. These could be due to prior injections Musculoskeletal: Both hips are in normal position and the SI joints appear well corticated.  The lumbar vertebrae are in normal alignment with mild degenerative disc disease at L1-2 and L5-S1 levels. IMPRESSION: 1. Probable superficial soft tissue abscesses within the buttocks left much greater than right with adjacent cellulitis left-greater-than-right. These apparent abscesses are immediately superficial to the gluteus maximus musculature. 2. Peripheral wedge-shaped low-attenuation area in the posterior left mid kidney could represent focal pyelonephritis. Correlate clinically. Electronically Signed   By: Ivar Drape M.D.   On: 08/31/2018 14:54   [place] Ekg Site Rite  Result Date: 09/26/2018 If Site Rite image not attached, placement could not be confirmed due to current cardiac rhythm.   Microbiology: Recent Results (from the past 240 hour(s))  Blood culture (routine x 2)     Status: None   Collection Time: 09/22/18  8:28 AM  Result Value Ref Range Status   Specimen Description   Final    BLOOD LEFT ANTECUBITAL Performed at [facility] [address] [place], [place] [address]    Special Requests   Final    BOTTLES DRAWN AEROBIC AND ANAEROBIC Blood Culture adequate volume Performed at [facility] [address] [place], [place] [address]    Culture   Final    NO GROWTH 5 DAYS Performed at [facility], [place] [address] [place], [place] [address]    Report Status 09/27/2018 FINAL  Final  Blood culture (routine x 2)     Status: None   Collection Time: 09/22/18  8:56 AM  Result Value Ref Range Status   Specimen Description   Final    BLOOD RIGHT ANTECUBITAL Performed at [facility] [address] [place], [place] [address]    Special Requests   Final    BOTTLES DRAWN AEROBIC AND ANAEROBIC Blood Culture results may not be optimal due to an excessive volume of blood received in culture bottles Performed at [facility] [address] [place], [place] [address]    Culture   Final    NO  GROWTH 5 DAYS Performed at [facility], [place] [address], [place], [place] [address]    Report Status 09/27/2018 FINAL  Final  Aerobic/Anaerobic Culture (surgical/deep wound)     Status: None (Preliminary result)   Collection Time: 09/22/18  2:31 PM  Result Value Ref Range Status   Specimen Description   Final    ABSCESS LEFT GLUTEAL Performed at [facility] [address], [place], [place] [address]    Special Requests   Final    NONE Performed at [facility], [place] [address] [place], [place] [address]    Gram Stain   Final    MODERATE WBC PRESENT,BOTH PMN AND MONONUCLEAR NO ORGANISMS SEEN    Culture   Final    CULTURE REINCUBATED FOR BETTER GROWTH Performed at [facility], [place] [address] [place], [place] [address]    Report Status PENDING  Incomplete  Acid Fast Smear (AFB)     Status: None   Collection Time: 09/22/18  2:31 PM  Result Value Ref Range Status   AFB Specimen Processing Concentration  Final   Acid Fast Smear Negative  Final    Comment: (NOTE) Performed At: [facility] [place], [place] [id number] Rush Farmer MD YT:[id number]    Source (AFB) ABSCESS  Final    Comment: LEFT GLUTEAL Performed at [facility], [place] [address], [place], [place] [address]      Labs: Basic Metabolic Panel: Recent Labs  Lab 09/22/18 0828 09/23/18 0513 09/24/18 0347 09/25/18 0352 09/27/18 0359  NA 134* 135 132* 134* 137  K 3.8 4.2 4.1 4.0 4.5  CL 95* 101 101 103 102  CO2 _0 GLUCOSE 119* 132* 124* 148* 116*  BUN _1 CREATININE 1.36* 1.26* 1.28* 1.28* 1.26*  CALCIUM 9.2 8.1* 7.8* 8.0* 8.6*   Liver Function Tests: Recent Labs  Lab 09/22/18 0828  AST 15  ALT 17  ALKPHOS 48  BILITOT 0.8  PROT 9.3*  ALBUMIN 4.3   No results for input(s): LIPASE, AMYLASE in the last 168 hours. No results for input(s): AMMONIA in the last 168 hours. CBC: Recent  Labs  Lab 09/22/18 0828 09/23/18 0513 09/24/18 0347 09/27/18 0359  WBC 13.7* 8.8 12.0* 10.5  NEUTROABS 8.5*  --   --   --   HGB 16.8 14.4 13.9 16.2  HCT 50.3 44.6 42.6 50.2  MCV 96.5 97.2 97.5 98.4  PLT 323 293 260 255   Cardiac Enzymes: No results for input(s): CKTOTAL, CKMB, CKMBINDEX, TROPONINI in the last 168 hours. BNP: BNP (last 3 results) No results for input(s): BNP in the last 8760 hours.  ProBNP (last 3 results) No results for input(s): PROBNP in the last 8760 hours.  CBG: No results for input(s): GLUCAP in the last 168 hours.     Signed:  Fayrene Helper MD.  [facility] 09/28/2018, 1:41 AM

## 2018-09-28 ENCOUNTER — Telehealth: Payer: Self-pay

## 2018-09-28 DIAGNOSIS — L0231 Cutaneous abscess of buttock: Secondary | ICD-10-CM | POA: Diagnosis not present

## 2018-09-28 LAB — AEROBIC/ANAEROBIC CULTURE W GRAM STAIN (SURGICAL/DEEP WOUND)

## 2018-09-28 NOTE — Telephone Encounter (Signed)
Patient needs a hospital follow up visit in the next 1-2 weeks with [name], NP.  He has Alpha and is not eligible for TCM.  Raquel Sarna, can you please call patient and get him scheduled?  Let me know if he has any questions or concerns following his discharge.  Thanks.

## 2018-09-28 NOTE — Telephone Encounter (Signed)
Attempted to reach person with no answer. No voicemail set up.

## 2018-09-30 DIAGNOSIS — R6889 Other general symptoms and signs: Secondary | ICD-10-CM | POA: Diagnosis not present

## 2018-09-30 DIAGNOSIS — L0231 Cutaneous abscess of buttock: Secondary | ICD-10-CM | POA: Diagnosis not present

## 2018-10-02 DIAGNOSIS — Z5181 Encounter for therapeutic drug level monitoring: Secondary | ICD-10-CM | POA: Diagnosis not present

## 2018-10-03 DIAGNOSIS — L0231 Cutaneous abscess of buttock: Secondary | ICD-10-CM | POA: Diagnosis not present

## 2018-10-03 DIAGNOSIS — R6889 Other general symptoms and signs: Secondary | ICD-10-CM | POA: Diagnosis not present

## 2018-10-04 ENCOUNTER — Encounter: Payer: Self-pay | Admitting: Primary Care

## 2018-10-04 ENCOUNTER — Ambulatory Visit: Payer: BLUE CROSS/BLUE SHIELD | Admitting: Primary Care

## 2018-10-04 VITALS — BP 128/72 | HR 74 | Temp 98.3°F | Ht 72.0 in | Wt 226.5 lb

## 2018-10-04 DIAGNOSIS — L0231 Cutaneous abscess of buttock: Secondary | ICD-10-CM | POA: Diagnosis not present

## 2018-10-04 DIAGNOSIS — N289 Disorder of kidney and ureter, unspecified: Secondary | ICD-10-CM | POA: Diagnosis not present

## 2018-10-04 DIAGNOSIS — R93429 Abnormal radiologic findings on diagnostic imaging of unspecified kidney: Secondary | ICD-10-CM | POA: Diagnosis not present

## 2018-10-04 DIAGNOSIS — L732 Hidradenitis suppurativa: Secondary | ICD-10-CM | POA: Diagnosis not present

## 2018-10-04 LAB — POC URINALSYSI DIPSTICK (AUTOMATED)
Bilirubin, UA: NEGATIVE
Blood, UA: NEGATIVE
Glucose, UA: NEGATIVE
LEUKOCYTES UA: NEGATIVE
Nitrite, UA: NEGATIVE
Protein, UA: NEGATIVE
Spec Grav, UA: 1.015 (ref 1.010–1.025)
Urobilinogen, UA: 0.2 E.U./dL
pH, UA: 6 (ref 5.0–8.0)

## 2018-10-04 LAB — BASIC METABOLIC PANEL
BUN: 16 mg/dL (ref 6–23)
CO2: 32 mEq/L (ref 19–32)
Calcium: 8.7 mg/dL (ref 8.4–10.5)
Chloride: 99 mEq/L (ref 96–112)
Creatinine, Ser: 1.35 mg/dL (ref 0.40–1.50)
GFR: 61.52 mL/min (ref >60.00)
GLUCOSE: 88 mg/dL (ref 70–99)
Potassium: 3.9 mEq/L (ref 3.5–5.1)
Sodium: 134 mEq/L — ABNORMAL LOW (ref 135–145)

## 2018-10-04 MED ORDER — OXYCODONE HCL 5 MG PO TABS: 5.0000 mg | ORAL_TABLET | Freq: Two times a day (BID) | ORAL | 0 refills | Status: DC | PRN

## 2018-10-04 NOTE — Assessment & Plan Note (Signed)
Recent admission for ongoing abscess that initially began in late September/early October 2019.  Treated with IV antibiotics, underwent incision and drainage of abscess.  Compliant to antibiotics as prescribed.  Home health nursing has evaluated his PICC line and is completing dressing changes.  He will follow-up with infectious disease and general surgery as scheduled.  Repeat imaging of the kidney pending.  BMP pending.  UA today overall unremarkable.  We will provide him with a 5-day supply of oxycodone until seen by general surgery.  Hospital notes, labs, imaging reviewed.  Return precautions provided.

## 2018-10-04 NOTE — Assessment & Plan Note (Signed)
Previously followed with dermatology, no longer on Humira.

## 2018-10-04 NOTE — Assessment & Plan Note (Signed)
UA today with trace ketones, otherwise unremarkable.  Repeat BMP pending.  Repeat imaging ordered and pending.

## 2018-10-04 NOTE — Progress Notes (Signed)
Subjective:    Patient ID: Justin Baldwin, male    DOB: [birth year], 42 y.o.   MRN: [id number]  HPI  Justin Baldwin is a 42 year old male with a history of hidranitis managed off and on with Humira through Dermatology who presents today for hospital follow up.  He was originally evaluated on 08/25/18 per Dr. Silvio Pate for complaints of lump to left buttocks that began three weeks prior while at the gym and accidentally swinging a weight. He endorsed tenderness and mild erythema during this visit but his lump was not thought to be infectious. Of note he was placed on Bactrim DS per urgent care for whom he saw prior to his visit on 08/25/18, no improvement. Also on Doxycycline in early October for suspicious rash.   He returned on 08/31/18 with reports of continued symptoms to the left buttocks, also with fevers. He was noted to have large deep mass to the mid left buttocks that was worrisome for abscess so he was sent for CT abdomen/pelvis and referred to general surgery for further evaluation.  CT abdomen/pelvis showed soft tissue abscess within the biotics.  He was evaluated at [facility] surgery on 09/01/2018, symptoms had been improving at that time so he was encouraged to continue Bactrim for another 2 weeks and follow-up 3 weeks later.  He returned to [place] surgery on 09/12/2018 due to persistent high fevers.  An ultrasound-guided aspiration to the abscess was performed, culture later grew Mycobacterium fortuitum.  He return for follow-up to [facility] surgery on 09/19/2018 with continued fevers, chills, urinary frequency, weakness.  At this point he had been on a total of 21 days of Bactrim so he was referred to infectious disease.  He call back later that day with increased pain so he was referred to the emergency department for further evaluation.  He presented to [facility] on 09/22/18 as requested for further evaluation and treatment of left buttocks abscess.  He endorsed having fevers  for "19 straight days" and was noted to have fever on arrival.  He underwent repeat CT abdomen/pelvis which showed progression of the left buttocks abscess with concerns for early muscle invasion and adjacent panniculitis and cellulitis.  He was admitted for further evaluation and treatment.  During his hospital stay he underwent I&D of the abscess per general surgery.  Blood cultures were drawn which did not show growth.  Infectious disease consulted and recommended IV antibiotics in the outpatient setting via PICC line.  He was initiated on imipenem plus oral levofloxacin and Bactrim.  Of note, CT abdomen pelvis did show abnormality to left kidney which was concerning for nephronia/nephritis.  It was recommended he undergo repeat imaging in the outpatient setting. He was set up for home health nursing care and discharged on 09/27/2018.   Since discharge home he denies fevers. He is visited by home health nursing staff every other day for evaluation of his PICC line and dressing changes to the left buttocks.  He has noted some moisture around the site of the PICC line dressing and has notified home health several times.  He is compliant to all antibiotics.  He is nearly out of his oxycodone and is requesting a refill today.  He has been taking this twice daily recently in an attempt to wean down.  He experiences most of his pain during dressing changes.  He has follow-up appointment scheduled with both infectious disease and general surgery in the outpatient setting.   Review of Systems  Constitutional: Positive for fatigue. Negative for fever.  Respiratory: Negative for shortness of breath.   Cardiovascular: Negative for chest pain.  Skin: Positive for wound. Negative for color change.  Neurological: Negative for dizziness.       Past Medical History:  Diagnosis Date  . Erectile dysfunction   . Insomnia   . Skin abnormality    hidradenitis suppurativa      Social History   Socioeconomic  History  . Marital status: Married    Spouse name: Not on file  . Number of children: Not on file  . Years of education: Not on file  . Highest education level: Not on file  Occupational History  . Not on file  Social Needs  . Financial resource strain: Not on file  . Food insecurity:    Worry: Not on file    Inability: Not on file  . Transportation needs:    Medical: Not on file    Non-medical: Not on file  Tobacco Use  . Smoking status: Current Every Day Smoker    Packs/day: 1.00    Types: Cigarettes  . Smokeless tobacco: Former Systems developer    Quit date: 02/14/2016  Substance and Sexual Activity  . Alcohol use: Yes    Alcohol/week: 0.0 standard drinks    Comment: occasinal   . Drug use: No  . Sexual activity: Not on file  Lifestyle  . Physical activity:    Days per week: Not on file    Minutes per session: Not on file  . Stress: Not on file  Relationships  . Social connections:    Talks on phone: Not on file    Gets together: Not on file    Attends religious service: Not on file    Active member of club or organization: Not on file    Attends meetings of clubs or organizations: Not on file    Relationship status: Not on file  . Intimate partner violence:    Fear of current or ex partner: Not on file    Emotionally abused: Not on file    Physically abused: Not on file    Forced sexual activity: Not on file  Other Topics Concern  . Not on file  Social History Narrative   Married.   2 children.   Works as a Nutritional therapist   Enjoys spending time with family.     Past Surgical History:  Procedure Laterality Date  . IRRIGATION AND DEBRIDEMENT ABSCESS N/A 09/22/2018   Procedure: IRRIGATION AND DEBRIDEMENT ABSCESS;  Surgeon: Ralene Ok, MD;  Location: [facility] ORS;  Service: General;  Laterality: N/A;    Family History  Problem Relation Age of Onset  . Heart attack Father   . Emphysema Paternal Grandmother   . Arthritis Mother     No Known Allergies  Current  Outpatient Medications on File Prior to Visit  Medication Sig Dispense Refill  . acetaminophen (TYLENOL) 500 MG tablet Take 2 tablets (1,000 mg total) by mouth every 6 (six) hours as needed. 30 tablet 0  . imipenem-cilastatin (PRIMAXIN) IVPB Inject 1,000 mg into the vein every 8 (eight) hours for 23 days. Indication:  Wound infection with mycobacterium fortuitum Last Day of Therapy:  10/20/2018 Labs - Once weekly:  CBC/D and BMP, Labs - Every other week:  ESR and CRP 69 Units 0  . levofloxacin (LEVAQUIN) 750 MG tablet Take 1 tablet (750 mg total) by mouth daily for 23 days. 23 tablet 0  . linezolid (ZYVOX) 600 MG  tablet Take 1 tablet (600 mg total) by mouth daily. 23 tablet 0  . methocarbamol (ROBAXIN) 500 MG tablet Take 1 tablet (500 mg total) by mouth every 8 (eight) hours as needed for muscle spasms. 20 tablet 0  . oxymetazoline (AFRIN) 0.05 % nasal spray Place 1-2 sprays into both nostrils as needed for congestion.      No current facility-administered medications on file prior to visit.     BP 128/72   Pulse 74   Temp 98.3 F (36.8 C) (Oral)   Ht 6' (1.829 m)   Wt 226 lb 8 oz (102.7 kg)   SpO2 98%   BMI 30.72 kg/m    Objective:   Physical Exam  Constitutional: He appears well-nourished.  Cardiovascular: Normal rate and regular rhythm.  Respiratory: Effort normal and breath sounds normal.  Skin: Skin is warm and dry.  Full dressing noted over left buttocks, no surrounding erythema.  No obvious moisture or erythema surrounding PICC line to right upper extremity.           Assessment & Plan:

## 2018-10-04 NOTE — Patient Instructions (Addendum)
Stop by the lab prior to leaving today. I will notify you of your results once received.   Use the oxycodone tablets sparingly as needed for severe pain.  Follow up with the surgeon and infectious disease as scheduled.  I'll be in touch regarding repeat imaging of the kidney.   It was a pleasure to see you today!

## 2018-10-05 DIAGNOSIS — L0231 Cutaneous abscess of buttock: Secondary | ICD-10-CM | POA: Diagnosis not present

## 2018-10-07 DIAGNOSIS — L0231 Cutaneous abscess of buttock: Secondary | ICD-10-CM | POA: Diagnosis not present

## 2018-10-09 DIAGNOSIS — L0231 Cutaneous abscess of buttock: Secondary | ICD-10-CM | POA: Diagnosis not present

## 2018-10-09 DIAGNOSIS — Z5181 Encounter for therapeutic drug level monitoring: Secondary | ICD-10-CM | POA: Diagnosis not present

## 2018-10-11 DIAGNOSIS — L0231 Cutaneous abscess of buttock: Secondary | ICD-10-CM | POA: Diagnosis not present

## 2018-10-11 LAB — MISC LABCORP TEST (SEND OUT): Labcorp test code: 182915

## 2018-10-12 ENCOUNTER — Telehealth: Payer: Self-pay | Admitting: Primary Care

## 2018-10-12 NOTE — Telephone Encounter (Signed)
[facility] called  CT and asked them to ask you to change the CT order to a CT Abdomen and Pelvis with and without contrast. They also need to look at the abscess on the patients left buttock and fluid collection and they dont want to have to make him have a separate CT just for that as we already have him scheduled at [facility] for 10/20/18. Stacey at [facility] can change your order for you if you will agree to do it. Just send Rosaria Ferries back your message and she will call [place] CT. I already have the Approval from [facility] for our CT. Please advise.

## 2018-10-13 ENCOUNTER — Other Ambulatory Visit: Payer: Self-pay | Admitting: Primary Care

## 2018-10-13 DIAGNOSIS — L0231 Cutaneous abscess of buttock: Secondary | ICD-10-CM

## 2018-10-13 DIAGNOSIS — R93429 Abnormal radiologic findings on diagnostic imaging of unspecified kidney: Secondary | ICD-10-CM

## 2018-10-13 DIAGNOSIS — N289 Disorder of kidney and ureter, unspecified: Secondary | ICD-10-CM

## 2018-10-13 NOTE — Telephone Encounter (Signed)
Noted and approved. Thank you!

## 2018-10-14 DIAGNOSIS — L0231 Cutaneous abscess of buttock: Secondary | ICD-10-CM | POA: Diagnosis not present

## 2018-10-16 ENCOUNTER — Encounter: Payer: Self-pay | Admitting: Internal Medicine

## 2018-10-16 ENCOUNTER — Telehealth: Payer: Self-pay | Admitting: *Deleted

## 2018-10-16 ENCOUNTER — Ambulatory Visit: Payer: BLUE CROSS/BLUE SHIELD | Admitting: Internal Medicine

## 2018-10-16 VITALS — BP 126/71 | HR 82 | Temp 98.6°F | Ht 71.0 in | Wt 242.0 lb

## 2018-10-16 DIAGNOSIS — Z5181 Encounter for therapeutic drug level monitoring: Secondary | ICD-10-CM | POA: Diagnosis not present

## 2018-10-16 DIAGNOSIS — A319 Mycobacterial infection, unspecified: Secondary | ICD-10-CM

## 2018-10-16 DIAGNOSIS — L0231 Cutaneous abscess of buttock: Secondary | ICD-10-CM | POA: Diagnosis not present

## 2018-10-16 DIAGNOSIS — Z452 Encounter for adjustment and management of vascular access device: Secondary | ICD-10-CM | POA: Diagnosis not present

## 2018-10-16 DIAGNOSIS — R21 Rash and other nonspecific skin eruption: Secondary | ICD-10-CM

## 2018-10-16 MED ORDER — FLUCONAZOLE 200 MG PO TABS: 200.0000 mg | ORAL_TABLET | Freq: Every day | ORAL | 1 refills | Status: DC

## 2018-10-16 NOTE — Assessment & Plan Note (Addendum)
Continue with current medications 1 more week to complete 28 days.  Will follow up in 2 weeks to reevaluate off of antibiotics. ADDENDUM: will continue treatment with oral linezolid and oral moxifloxacin after his month of IV therapy with linezolid and levaquin.  Patient notified.

## 2018-10-16 NOTE — Progress Notes (Signed)
[place] and spoke with Jackelyn Poling and gave orders per Dr. Linus Salmons to pull picc line once IV antibiotics are completed. Patient made aware during office visit. Also request labs to be sent to [phone].

## 2018-10-16 NOTE — Progress Notes (Signed)
   Subjective:    Patient ID: Justin Baldwin, male    DOB: 16-Oct-1976, 42 y.o.   MRN: [id number]  HPI Here for HSFU Has a soft tissue infection and cluture with M fortuitum and placed on IV imipenem, oral levofloxacin and oral linezolid.  No labs available for review at this time.  No associated diarrhea. Some groin rash.  Has had issues with the picc line with serosanguinous draiange.  No pus, no pain, no swelling.  Xray per patient report noted line in appropriate area.  No warmth.  Taking antibiotics as prescribed.  Notes a metallic taste.  Has seen the surgeon and feels it is going well; drain remains in place.    Review of Systems  Constitutional: Positive for fatigue. Negative for chills and fever.  Gastrointestinal: Negative for diarrhea and nausea.  Skin: Positive for rash.       Objective:   Physical Exam Constitutional:      Appearance: Normal appearance.  HENT:     Mouth/Throat:     Pharynx: No posterior oropharyngeal erythema.  Eyes:     General: No scleral icterus. Cardiovascular:     Rate and Rhythm: Normal rate and regular rhythm.  Pulmonary:     Effort: Pulmonary effort is normal. No respiratory distress.     Breath sounds: Normal breath sounds.  Musculoskeletal:     Comments: picc area with skin irritation, no drainage noted now  Neurological:     Mental Status: He is alert.    SH: + tobacco       Assessment & Plan:

## 2018-10-16 NOTE — Assessment & Plan Note (Addendum)
New problem. Will send in fluconazole for Candidal rash

## 2018-10-16 NOTE — Assessment & Plan Note (Signed)
Will have it removed after treatment completion in about 7 days.

## 2018-10-16 NOTE — Telephone Encounter (Signed)
Justin Baldwin called to advise the patient PICC is out about 10 cm and she feels it is ok to continue the medication he is on as peripheral dosing is acceptable she wanted the provider to know. Advised will let him know and give a call back once he says ok or not.

## 2018-10-17 MED ORDER — MOXIFLOXACIN HCL 400 MG PO TABS: 400.0000 mg | ORAL_TABLET | Freq: Every day | ORAL | 1 refills | Status: DC

## 2018-10-17 MED ORDER — LINEZOLID 600 MG PO TABS: 600.0000 mg | ORAL_TABLET | Freq: Two times a day (BID) | ORAL | 1 refills | Status: DC

## 2018-10-17 NOTE — Addendum Note (Signed)
Addended by: Thayer Headings on: 10/17/2018 10:44 AM   Modules accepted: Orders

## 2018-10-17 NOTE — Telephone Encounter (Signed)
That is ok.  I also was thinking about him as well after discussing with the other doctors that I would like him to continue with 2 oral medications after his picc is pulled next week.  I will send those in to his pharmacy today.  He already has follow up with me in 2 weeks.   thanks

## 2018-10-19 DIAGNOSIS — R6882 Decreased libido: Secondary | ICD-10-CM | POA: Diagnosis not present

## 2018-10-19 DIAGNOSIS — E291 Testicular hypofunction: Secondary | ICD-10-CM | POA: Diagnosis not present

## 2018-10-19 DIAGNOSIS — L0231 Cutaneous abscess of buttock: Secondary | ICD-10-CM | POA: Diagnosis not present

## 2018-10-19 NOTE — Telephone Encounter (Signed)
Called patient, let him know that there were 2 antibiotic prescriptions sent to the [facility] for him to start once his PICC has been pulled.  Confirmed one was a continuation (zyvox/linezolid) and the other was new (avalox/moxifloxacin).  Confirmed his upcoming appointment 1/7 with Dr Linus Salmons. Landis Gandy, RN

## 2018-10-20 ENCOUNTER — Ambulatory Visit (INDEPENDENT_AMBULATORY_CARE_PROVIDER_SITE_OTHER)
Admission: RE | Admit: 2018-10-20 | Discharge: 2018-10-20 | Disposition: A | Discharge status: Home / Self Care | Payer: BLUE CROSS/BLUE SHIELD | Source: Ambulatory Visit | Attending: Primary Care | Admitting: Primary Care

## 2018-10-20 DIAGNOSIS — R93429 Abnormal radiologic findings on diagnostic imaging of unspecified kidney: Secondary | ICD-10-CM

## 2018-10-20 DIAGNOSIS — N289 Disorder of kidney and ureter, unspecified: Secondary | ICD-10-CM

## 2018-10-20 DIAGNOSIS — L03317 Cellulitis of buttock: Secondary | ICD-10-CM | POA: Diagnosis not present

## 2018-10-20 DIAGNOSIS — N281 Cyst of kidney, acquired: Secondary | ICD-10-CM | POA: Diagnosis not present

## 2018-10-20 DIAGNOSIS — L0231 Cutaneous abscess of buttock: Secondary | ICD-10-CM

## 2018-10-20 MED ORDER — IOPAMIDOL (ISOVUE-300) INJECTION 61%
100.0000 mL | Freq: Once | INTRAVENOUS | Status: AC | PRN
  Administered 2018-10-20: 100 mL via INTRAVENOUS

## 2018-10-21 DIAGNOSIS — L0231 Cutaneous abscess of buttock: Secondary | ICD-10-CM | POA: Diagnosis not present

## 2018-10-23 ENCOUNTER — Other Ambulatory Visit: Payer: Self-pay | Admitting: Primary Care

## 2018-10-23 DIAGNOSIS — N281 Cyst of kidney, acquired: Secondary | ICD-10-CM

## 2018-10-25 ENCOUNTER — Other Ambulatory Visit: Payer: Self-pay | Admitting: Primary Care

## 2018-10-25 DIAGNOSIS — N281 Cyst of kidney, acquired: Secondary | ICD-10-CM

## 2018-10-26 ENCOUNTER — Ambulatory Visit: Payer: BLUE CROSS/BLUE SHIELD | Admitting: Internal Medicine

## 2018-10-31 ENCOUNTER — Ambulatory Visit: Payer: BLUE CROSS/BLUE SHIELD | Admitting: Internal Medicine

## 2018-10-31 ENCOUNTER — Encounter: Payer: Self-pay | Admitting: Internal Medicine

## 2018-10-31 VITALS — BP 114/79 | HR 80 | Temp 98.3°F | Ht 72.0 in | Wt 230.0 lb

## 2018-10-31 DIAGNOSIS — Z452 Encounter for adjustment and management of vascular access device: Secondary | ICD-10-CM

## 2018-10-31 DIAGNOSIS — L0231 Cutaneous abscess of buttock: Secondary | ICD-10-CM | POA: Diagnosis not present

## 2018-10-31 DIAGNOSIS — Z5181 Encounter for therapeutic drug level monitoring: Secondary | ICD-10-CM | POA: Diagnosis not present

## 2018-10-31 DIAGNOSIS — A319 Mycobacterial infection, unspecified: Secondary | ICD-10-CM

## 2018-10-31 NOTE — Progress Notes (Signed)
   Subjective:    Patient ID: Justin Baldwin, male    DOB: 1976/02/01, 43 y.o.   MRN: [id number]  HPI Here for HSFU Has a soft tissue infection and cluture with M fortuitum and placed on IV imipenem, oral levofloxacin and oral linezolid.  No labs available for review at this time.  No associated diarrhea. Had two new pockets of pus and drained last week.  Discussed with  PA with [facility].  Patient reports he saw the surgeon yesterday and looks good, no further pus.  Continues on linezolid and moxifloxacin.  Some dizziness with moxifloxacin.    Review of Systems  Constitutional: Negative for chills, fatigue and fever.  Gastrointestinal: Negative for diarrhea and nausea.  Skin: Positive for rash.       Some small areas of erythema and itchy       Objective:   Physical Exam Constitutional:      Appearance: Normal appearance.  HENT:     Mouth/Throat:     Pharynx: No posterior oropharyngeal erythema.  Eyes:     General: No scleral icterus. Cardiovascular:     Rate and Rhythm: Normal rate and regular rhythm.  Pulmonary:     Effort: Pulmonary effort is normal. No respiratory distress.     Breath sounds: Normal breath sounds.  Musculoskeletal:     Comments: picc area with skin irritation, no drainage noted now  Neurological:     Mental Status: He is alert.    SH: + tobacco       Assessment & Plan:

## 2018-10-31 NOTE — Assessment & Plan Note (Signed)
Resolved now with drainage.

## 2018-10-31 NOTE — Assessment & Plan Note (Signed)
Has been removed.  No issues.

## 2018-10-31 NOTE — Assessment & Plan Note (Signed)
Having no peripheral neuropathy, will check cbc today

## 2018-10-31 NOTE — Assessment & Plan Note (Signed)
Seems to be resolving now by his report.  Will continue another month of oral antibiotics and consider stopping at his next visit. rtc 2-3 weeks

## 2018-11-01 LAB — CBC WITH DIFFERENTIAL/PLATELET
Absolute Monocytes: 536 cells/uL (ref 200–950)
BASOS ABS: 41 {cells}/uL (ref 0–200)
Basophils Relative: 0.4 %
Eosinophils Absolute: 62 cells/uL (ref 15–500)
Eosinophils Relative: 0.6 %
HEMATOCRIT: 44.4 % (ref 38.5–50.0)
HEMOGLOBIN: 15.3 g/dL (ref 13.2–17.1)
Lymphs Abs: 3131 cells/uL (ref 850–3900)
MCH: 31 pg (ref 27.0–33.0)
MCHC: 34.5 g/dL (ref 32.0–36.0)
MCV: 89.9 fL (ref 80.0–100.0)
MPV: 10.6 fL (ref 7.5–12.5)
Monocytes Relative: 5.2 %
Neutro Abs: 6530 cells/uL (ref 1500–7800)
Neutrophils Relative %: 63.4 %
Platelets: 215 10*3/uL (ref 140–400)
RBC: 4.94 10*6/uL (ref 4.20–5.80)
RDW: 12.9 % (ref 11.0–15.0)
Total Lymphocyte: 30.4 %
WBC: 10.3 10*3/uL (ref 3.8–10.8)

## 2018-11-01 LAB — COMPREHENSIVE METABOLIC PANEL
AG Ratio: 1.4 (calc) (ref 1.0–2.5)
ALKALINE PHOSPHATASE (APISO): 63 U/L (ref 40–115)
ALT: 14 U/L (ref 9–46)
AST: 13 U/L (ref 10–40)
Albumin: 4.3 g/dL (ref 3.6–5.1)
BUN: 17 mg/dL (ref 7–25)
CO2: 27 mmol/L (ref 20–32)
Calcium: 9.2 mg/dL (ref 8.6–10.3)
Chloride: 104 mmol/L (ref 98–110)
Creat: 1.29 mg/dL (ref 0.60–1.35)
Globulin: 3.1 g/dL (calc) (ref 1.9–3.7)
Glucose, Bld: 97 mg/dL (ref 65–99)
Potassium: 4.7 mmol/L (ref 3.5–5.3)
Sodium: 139 mmol/L (ref 135–146)
Total Bilirubin: 0.6 mg/dL (ref 0.2–1.2)
Total Protein: 7.4 g/dL (ref 6.1–8.1)

## 2018-11-08 LAB — RAPID GROWER BROTH SUSCEP.
Clarithromycin: 16
Doxycycline: >16
Imipenem: 4
Tigecycline: 0.03

## 2018-11-08 LAB — ORG ID BY SEQUENCING RFLX AST

## 2018-11-08 LAB — AFB ORGANISM ID BY DNA PROBE
M avium complex: NEGATIVE
M tuberculosis complex: NEGATIVE

## 2018-11-08 LAB — ACID FAST CULTURE WITH REFLEXED SENSITIVITIES (MYCOBACTERIA): Acid Fast Culture: POSITIVE — AB

## 2018-11-13 ENCOUNTER — Ambulatory Visit
Admission: RE | Admit: 2018-11-13 | Discharge: 2018-11-13 | Disposition: A | Discharge status: Home / Self Care | Payer: BLUE CROSS/BLUE SHIELD | Source: Ambulatory Visit | Attending: Primary Care | Admitting: Primary Care

## 2018-11-13 DIAGNOSIS — N281 Cyst of kidney, acquired: Secondary | ICD-10-CM

## 2018-11-13 DIAGNOSIS — N2889 Other specified disorders of kidney and ureter: Secondary | ICD-10-CM | POA: Diagnosis not present

## 2018-11-13 MED ORDER — GADOBENATE DIMEGLUMINE 529 MG/ML IV SOLN
20.0000 mL | Freq: Once | INTRAVENOUS | Status: AC | PRN
  Administered 2018-11-13: 20 mL via INTRAVENOUS

## 2018-11-16 DIAGNOSIS — L03317 Cellulitis of buttock: Secondary | ICD-10-CM | POA: Diagnosis not present

## 2018-11-20 DIAGNOSIS — F419 Anxiety disorder, unspecified: Secondary | ICD-10-CM | POA: Diagnosis not present

## 2018-11-22 ENCOUNTER — Encounter: Payer: Self-pay | Admitting: *Deleted

## 2018-11-27 ENCOUNTER — Other Ambulatory Visit: Payer: Self-pay

## 2018-11-27 ENCOUNTER — Other Ambulatory Visit: Payer: Self-pay | Admitting: Internal Medicine

## 2018-11-27 MED ORDER — MOXIFLOXACIN HCL 400 MG PO TABS: 400.0000 mg | ORAL_TABLET | Freq: Every day | ORAL | 1 refills | Status: DC

## 2018-11-28 ENCOUNTER — Telehealth: Payer: Self-pay | Admitting: *Deleted

## 2018-11-28 DIAGNOSIS — E785 Hyperlipidemia, unspecified: Secondary | ICD-10-CM

## 2018-11-28 NOTE — Telephone Encounter (Signed)
Spoke to pt who is requesting a call back to discuss MRI results further. Pt advised of [place] comments but he is wanting additional info about the letter he received.

## 2018-11-29 NOTE — Telephone Encounter (Signed)
Spoken to patient and he stated that he had question regarding the report. I told him to let me pull up his chart but he stated that he would like to see a specialist to have a better understanding of what is going on.

## 2018-11-29 NOTE — Telephone Encounter (Signed)
Discussed results with patient via phone, also discussed the advanced atherosclerosis. Last lipid panel with LDL of 230 in 2017, also family history of heart disease.  At that time we recommended treatment for which he was undecided.  He never contacted our office back regarding his decision.  Discussed atherosclerosis with patient and recommended we repeat his lipid panel and consider statin therapy.  He will come in for labs fasting this week and will think about statin treatment.  Await lab results.  He had no further questions regarding the kidney cyst.

## 2018-11-30 ENCOUNTER — Ambulatory Visit: Payer: BLUE CROSS/BLUE SHIELD | Admitting: Internal Medicine

## 2018-11-30 ENCOUNTER — Encounter: Payer: Self-pay | Admitting: Internal Medicine

## 2018-11-30 VITALS — BP 118/73 | HR 82 | Temp 98.3°F | Ht 73.0 in | Wt 226.2 lb

## 2018-11-30 DIAGNOSIS — Z5181 Encounter for therapeutic drug level monitoring: Secondary | ICD-10-CM | POA: Diagnosis not present

## 2018-11-30 DIAGNOSIS — I700 Atherosclerosis of aorta: Secondary | ICD-10-CM | POA: Diagnosis not present

## 2018-11-30 DIAGNOSIS — R42 Dizziness and giddiness: Secondary | ICD-10-CM | POA: Diagnosis not present

## 2018-11-30 DIAGNOSIS — A319 Mycobacterial infection, unspecified: Secondary | ICD-10-CM | POA: Diagnosis not present

## 2018-11-30 HISTORY — DX: Dizziness and giddiness: R42

## 2018-11-30 LAB — CBC WITH DIFFERENTIAL/PLATELET
ABSOLUTE MONOCYTES: 517 {cells}/uL (ref 200–950)
Basophils Absolute: 41 cells/uL (ref 0–200)
Basophils Relative: 0.5 %
Eosinophils Absolute: 57 cells/uL (ref 15–500)
Eosinophils Relative: 0.7 %
HCT: 44.1 % (ref 38.5–50.0)
HEMOGLOBIN: 15.4 g/dL (ref 13.2–17.1)
Lymphs Abs: 3206 cells/uL (ref 850–3900)
MCH: 32.5 pg (ref 27.0–33.0)
MCHC: 34.9 g/dL (ref 32.0–36.0)
MCV: 93 fL (ref 80.0–100.0)
MPV: 11 fL (ref 7.5–12.5)
Monocytes Relative: 6.3 %
Neutro Abs: 4379 cells/uL (ref 1500–7800)
Neutrophils Relative %: 53.4 %
Platelets: 192 10*3/uL (ref 140–400)
RBC: 4.74 10*6/uL (ref 4.20–5.80)
RDW: 16.2 % — ABNORMAL HIGH (ref 11.0–15.0)
Total Lymphocyte: 39.1 %
WBC: 8.2 10*3/uL (ref 3.8–10.8)

## 2018-11-30 LAB — COMPLETE METABOLIC PANEL WITH GFR
AG RATIO: 1.5 (calc) (ref 1.0–2.5)
ALT: 30 U/L (ref 9–46)
AST: 19 U/L (ref 10–40)
Albumin: 4.5 g/dL (ref 3.6–5.1)
Alkaline phosphatase (APISO): 62 U/L (ref 36–130)
BUN/Creatinine Ratio: 13 (calc) (ref 6–22)
BUN: 18 mg/dL (ref 7–25)
CO2: 27 mmol/L (ref 20–32)
Calcium: 9.7 mg/dL (ref 8.6–10.3)
Chloride: 104 mmol/L (ref 98–110)
Creat: 1.38 mg/dL — ABNORMAL HIGH (ref 0.60–1.35)
GFR, Est African American: 73 mL/min/{1.73_m2} (ref >=60)
GFR, Est Non African American: 63 mL/min/{1.73_m2} (ref >=60)
Globulin: 3.1 g/dL (calc) (ref 1.9–3.7)
Glucose, Bld: 140 mg/dL — ABNORMAL HIGH (ref 65–99)
POTASSIUM: 4.6 mmol/L (ref 3.5–5.3)
Sodium: 141 mmol/L (ref 135–146)
Total Bilirubin: 0.6 mg/dL (ref 0.2–1.2)
Total Protein: 7.6 g/dL (ref 6.1–8.1)

## 2018-11-30 LAB — LIPID PANEL
Cholesterol: 227 mg/dL — ABNORMAL HIGH (ref <200)
HDL: 42 mg/dL (ref >=40)
LDL Cholesterol (Calc): 154 mg/dL (calc) — ABNORMAL HIGH
Non-HDL Cholesterol (Calc): 185 mg/dL (calc) — ABNORMAL HIGH (ref <130)
Total CHOL/HDL Ratio: 5.4 (calc) — ABNORMAL HIGH (ref <5.0)
Triglycerides: 179 mg/dL — ABNORMAL HIGH (ref <150)

## 2018-11-30 NOTE — Assessment & Plan Note (Signed)
He has been on a prolonged course for this infection.  I discussed things to watch for if his infection returns but at this time, will stop his antibiotics and observe off of treatment.

## 2018-11-30 NOTE — Progress Notes (Signed)
   Subjective:    Patient ID: Justin Baldwin, male    DOB: 06-Nov-1975, 43 y.o.   MRN: [id number]  HPI Here for HSFU Has a soft tissue infection and cluture with M fortuitum and placed on IV imipenem, oral levofloxacin and oral linezolid and stopped the IV medication after 28 days.  I then had him continue with oral linezolid and moxifloxacin now about 6 weeks.  His wounds had some persistent pus but now about completely healed.  No associated swelling, erythema.  He is continuing to have some dizziness with the medication.  No further follow up with the surgeon now.  CBC at his last surgical visit was wnl.  No associated peripheral neuropathy.     Review of Systems  Constitutional: Negative for chills, fatigue and fever.  Gastrointestinal: Negative for diarrhea and nausea.  Skin: Negative for rash.       Some small areas of erythema and itchy  Neurological: Positive for dizziness.       Objective:   Physical Exam Constitutional:      Appearance: Normal appearance.  Eyes:     General: No scleral icterus. Cardiovascular:     Rate and Rhythm: Normal rate and regular rhythm.  Pulmonary:     Effort: Pulmonary effort is normal. No respiratory distress.     Breath sounds: Normal breath sounds.  Skin:    Comments: Back areas with indentation but very minimal opening with a small amount of drainage.  No erythema, no tenderness.    Neurological:     Mental Status: He is alert.    SH: + tobacco       Assessment & Plan:

## 2018-11-30 NOTE — Assessment & Plan Note (Signed)
Will check cbc and cmp today

## 2018-11-30 NOTE — Assessment & Plan Note (Signed)
Will check his lipid panel which Gentry Fitz NP has ordered with results to her.

## 2018-11-30 NOTE — Assessment & Plan Note (Signed)
From the medication likely.  Will stop today and should resolve in the next week or so.

## 2018-12-04 DIAGNOSIS — F419 Anxiety disorder, unspecified: Secondary | ICD-10-CM | POA: Diagnosis not present

## 2018-12-05 ENCOUNTER — Telehealth: Payer: Self-pay

## 2018-12-05 DIAGNOSIS — I700 Atherosclerosis of aorta: Secondary | ICD-10-CM

## 2018-12-05 DIAGNOSIS — E785 Hyperlipidemia, unspecified: Secondary | ICD-10-CM

## 2018-12-05 MED ORDER — ATORVASTATIN CALCIUM 40 MG PO TABS: 40.0000 mg | ORAL_TABLET | Freq: Every day | ORAL | 3 refills | Status: DC

## 2018-12-05 NOTE — Telephone Encounter (Signed)
Please notify patient that I reviewed his cholesterol lab drawn last week. I recommend we start a medication called atorvastatin 40 mg.  This is taken once daily and I will send it to his pharmacy now.  We will need to repeat his cholesterol in 6 weeks to ensure the medication is working effectively.  Please schedule lab only appointment for that time.  He will need to be fasting for hours prior.

## 2018-12-05 NOTE — Telephone Encounter (Signed)
Pt left v/m; pt was to come in for lipid labs and pt saw Dr Linus Salmons with inf disease on 11/30/18; lipid labs were also done on 11/30/18. Pt request Gentry Fitz NP to review lipid labs done on 11/30/18 and contact pt. Pt last saw Gentry Fitz NP for HFU on 10/04/18.

## 2018-12-05 NOTE — Telephone Encounter (Signed)
Spoken and notified patient of Kate Clark's comments. Patient verbalized understanding.  

## 2018-12-11 DIAGNOSIS — F419 Anxiety disorder, unspecified: Secondary | ICD-10-CM | POA: Diagnosis not present

## 2018-12-18 DIAGNOSIS — L03317 Cellulitis of buttock: Secondary | ICD-10-CM | POA: Diagnosis not present

## 2018-12-28 DIAGNOSIS — F419 Anxiety disorder, unspecified: Secondary | ICD-10-CM | POA: Diagnosis not present

## 2019-01-10 DIAGNOSIS — F419 Anxiety disorder, unspecified: Secondary | ICD-10-CM | POA: Diagnosis not present

## 2019-01-17 ENCOUNTER — Other Ambulatory Visit (INDEPENDENT_AMBULATORY_CARE_PROVIDER_SITE_OTHER): Payer: BLUE CROSS/BLUE SHIELD

## 2019-01-17 ENCOUNTER — Other Ambulatory Visit: Payer: Self-pay

## 2019-01-17 DIAGNOSIS — E785 Hyperlipidemia, unspecified: Secondary | ICD-10-CM | POA: Diagnosis not present

## 2019-01-17 DIAGNOSIS — F418 Other specified anxiety disorders: Secondary | ICD-10-CM | POA: Diagnosis not present

## 2019-01-17 LAB — HEPATIC FUNCTION PANEL
ALK PHOS: 39 U/L (ref 39–117)
ALT: 19 U/L (ref 0–53)
AST: 18 U/L (ref 0–37)
Albumin: 4.3 g/dL (ref 3.5–5.2)
Bilirubin, Direct: 0.1 mg/dL (ref 0.0–0.3)
Total Bilirubin: 0.3 mg/dL (ref 0.2–1.2)
Total Protein: 7.5 g/dL (ref 6.0–8.3)

## 2019-01-17 LAB — LIPID PANEL
Cholesterol: 163 mg/dL (ref 0–200)
HDL: 26.3 mg/dL — ABNORMAL LOW (ref >39.00)
LDL Cholesterol: 106 mg/dL — ABNORMAL HIGH (ref 0–99)
NonHDL: 136.43
Total CHOL/HDL Ratio: 6
Triglycerides: 153 mg/dL — ABNORMAL HIGH (ref 0.0–149.0)
VLDL: 30.6 mg/dL (ref 0.0–40.0)

## 2019-01-24 DIAGNOSIS — F419 Anxiety disorder, unspecified: Secondary | ICD-10-CM | POA: Diagnosis not present

## 2019-01-31 DIAGNOSIS — F418 Other specified anxiety disorders: Secondary | ICD-10-CM | POA: Diagnosis not present

## 2019-03-06 ENCOUNTER — Ambulatory Visit (INDEPENDENT_AMBULATORY_CARE_PROVIDER_SITE_OTHER): Payer: BLUE CROSS/BLUE SHIELD | Admitting: Primary Care

## 2019-03-06 DIAGNOSIS — K219 Gastro-esophageal reflux disease without esophagitis: Secondary | ICD-10-CM | POA: Diagnosis not present

## 2019-03-06 MED ORDER — OMEPRAZOLE 40 MG PO CPDR: 40.0000 mg | DELAYED_RELEASE_CAPSULE | Freq: Every day | ORAL | 0 refills | Status: DC

## 2019-03-06 NOTE — Assessment & Plan Note (Signed)
Symptoms suggestive of GERD. Discussed triggers of GERD including food, beverages, smoking. Also discussed to avoid laying flat for two hours after eating. Given severity of symptoms and lack of improvement with OTC treatment, we will start with one month of omeprazole 40 mg daily, then transition to omeprazole 20 mg daily if needed. Rx sent to pharmacy. He will update.

## 2019-03-06 NOTE — Progress Notes (Signed)
Subjective:    Patient ID: Justin Baldwin, male    DOB: Oct 18, 1976, 43 y.o.   MRN: [id number]  HPI  Virtual Visit via Video Note  I connected with Justin Baldwin on 03/06/19 at  9:20 AM EDT by a video enabled telemedicine application and verified that I am speaking with the correct person using two identifiers.  Location: Patient: Home Provider: Office   I discussed the limitations of evaluation and management by telemedicine and the availability of in person appointments. The patient expressed understanding and agreed to proceed.  History of Present Illness:  [name] is a 43 year old male with a history of atherosclerosis of aorta, kidney sufficiency, atypical mycobacterium infection, tobacco abuse who presents today with a chief complaint of acid reflux.  He also reports epigastric and bilateral upper abdomen pain, esophageal burning, chest pressure, smaller stools. He denies nausea, throat fullness. He's noticed that dairy, coffee will make symptoms worse. His reflux will wake him at night. He's tried taking OTC Pepcid and Prilosec infrequently, and using Ginger Ale without much improvement.  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   Observations/Objective:  Alert and oriented. Appears well, not sickly. No distress. Speaking in complete sentences.   Assessment and Plan:  Symptoms suggestive of GERD. Discussed triggers of GERD including food, beverages, smoking. Also discussed to avoid laying flat for two hours after eating. Given severity of symptoms and lack of improvement with OTC treatment, we will start with one month of omeprazole 40 mg daily, then transition to omeprazole 20 mg daily if needed. Rx sent to pharmacy. He will update.   Follow Up Instructions:  Start omeprazole 40 mg capsules daily for esophageal reflux. Take 1 capsule by mouth once daily for one month.  If your symptoms return then try consistently taking  omeprazole 20 mg daily for another month.   Reduce exposure to triggers for heartburn such as smoking, spicy/acidic/greasy food, caffeine.  Avoid laying flat within 2 hours after eating.  Please update me in 1-2 weeks if no improvement. It was a pleasure to see you today! Justin Bossier, NP-C    I discussed the assessment and treatment plan with the patient. The patient was provided an opportunity to ask questions and all were answered. The patient agreed with the plan and demonstrated an understanding of the instructions.   The patient was advised to call back or seek an in-person evaluation if the symptoms worsen or if the condition fails to improve as anticipated.     Pleas [name], NP    Review of Systems  Constitutional: Negative for fever.  Respiratory: Negative for shortness of breath.   Cardiovascular: Negative for chest pain.  Gastrointestinal: Positive for abdominal pain. Negative for constipation, diarrhea and nausea.       Esophageal burning       Past Medical History:  Diagnosis Date  . Erectile dysfunction   . Insomnia   . Skin abnormality    hidradenitis suppurativa      Social History   Socioeconomic History  . Marital status: Married    Spouse name: Not on file  . Number of children: Not on file  . Years of education: Not on file  . Highest education level: Not on file  Occupational History  . Not on file  Social Needs  . Financial resource strain: Not on file  . Food insecurity:    Worry: Not on file  Inability: Not on file  . Transportation needs:    Medical: Not on file    Non-medical: Not on file  Tobacco Use  . Smoking status: Current Every Day Smoker    Packs/day: 1.00    Types: Cigarettes  . Smokeless tobacco: Former Systems developer    Quit date: 02/14/2016  Substance and Sexual Activity  . Alcohol use: Yes    Alcohol/week: 0.0 standard drinks    Comment: occasinal   . Drug use: No  . Sexual activity: Not on file  Lifestyle  .  Physical activity:    Days per week: Not on file    Minutes per session: Not on file  . Stress: Not on file  Relationships  . Social connections:    Talks on phone: Not on file    Gets together: Not on file    Attends religious service: Not on file    Active member of club or organization: Not on file    Attends meetings of clubs or organizations: Not on file    Relationship status: Not on file  . Intimate partner violence:    Fear of current or ex partner: Not on file    Emotionally abused: Not on file    Physically abused: Not on file    Forced sexual activity: Not on file  Other Topics Concern  . Not on file  Social History Narrative   Married.   2 children.   Works as a Nutritional therapist   Enjoys spending time with family.     Past Surgical History:  Procedure Laterality Date  . IRRIGATION AND DEBRIDEMENT ABSCESS N/A 09/22/2018   Procedure: IRRIGATION AND DEBRIDEMENT ABSCESS;  Surgeon: Ralene Ok, MD;  Location: [facility];  Service: General;  Laterality: N/A;    Family History  Problem Relation Age of Onset  . Heart attack Father   . Emphysema Paternal Grandmother   . Arthritis Mother     No Known Allergies  Current Outpatient Medications on File Prior to Visit  Medication Sig Dispense Refill  . atorvastatin (LIPITOR) 40 MG tablet Take 1 tablet (40 mg total) by mouth daily. For cholesterol. 90 tablet 3  . oxymetazoline (AFRIN) 0.05 % nasal spray Place 1-2 sprays into both nostrils as needed for congestion.     Marland Kitchen oxyCODONE (OXY IR/ROXICODONE) 5 MG immediate release tablet Take 1 tablet (5 mg total) by mouth 2 (two) times daily as needed for severe pain. (Patient not taking: Reported on 03/06/2019) 10 tablet 0   No current facility-administered medications on file prior to visit.     There were no vitals taken for this visit.   Objective:   Physical Exam  Constitutional: He is oriented to person, place, and time. He appears well-nourished. He does not have a  sickly appearance. He does not appear ill.  Respiratory: Effort normal.  Neurological: He is alert and oriented to person, place, and time.  Psychiatric: He has a normal mood and affect.           Assessment & Plan:

## 2019-03-06 NOTE — Patient Instructions (Signed)
Start omeprazole 40 mg capsules daily for esophageal reflux. Take 1 capsule by mouth once daily for one month.  If your symptoms return then try consistently taking omeprazole 20 mg daily for another month.   Reduce exposure to triggers for heartburn such as smoking, spicy/acidic/greasy food, caffeine.  Avoid laying flat within 2 hours after eating.  Please update me in 1-2 weeks if no improvement. It was a pleasure to see you today! [name], NP-C

## 2019-03-26 IMAGING — MR MR ABDOMEN WO/W CM
17 series · 48 of 48 positions shown · IV contrast (20 ml multihance)
Comparison: 10/20/2018 CT.  Lumbar spine MR of 04/26/2011.

CLINICAL DATA: Left renal lesion on CT.

EXAM:
MRI ABDOMEN WITHOUT AND WITH CONTRAST
TECHNIQUE: Multiplanar multisequence MR imaging of the abdomen was performed
both before and after the administration of intravenous contrast.
CONTRAST:  20mL MULTIHANCE GADOBENATE DIMEGLUMINE 529 MG/ML IV SOLN

[Series 4: T2 · axial · 5.0mm · 1.48mm/px · 1 of 38 slices shown (1 of 3)]
[im 1/38]
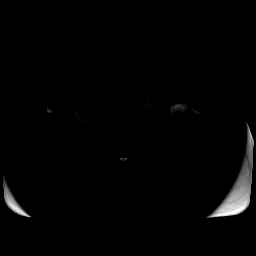

[Series 5: DWI · axial · 5.0mm · 1.42mm/px · z∈[-71,+151]mm · 5 of 114 slices shown (1 of 2)]
[im 1/114]
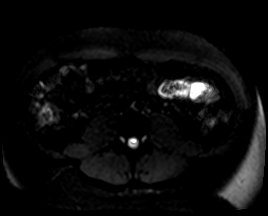
[im 29/114]
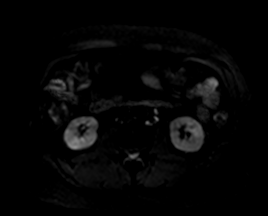
[im 57/114]
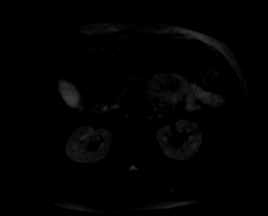
[im 85/114]
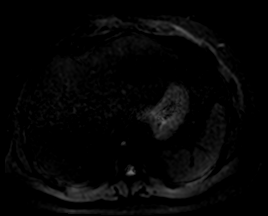
[im 114/114]
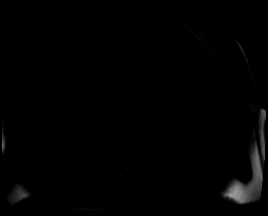

[Series 6: DWI · axial · 5.0mm · 1.42mm/px · z∈[-71,+151]mm · 2 of 38 slices shown (2 of 2)]
[im 1/38]
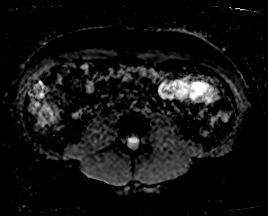
[im 38/38]
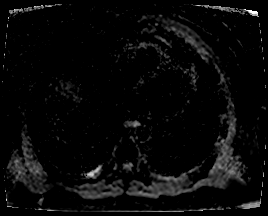

[Series 7: T2 · coronal · 5.0mm · 1.56mm/px · 1 of 36 slices shown (2 of 3)]
[im 1/36]
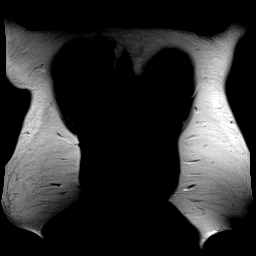

[Series 8: T1 · axial · 3.0mm · 1.19mm/px · z∈[-77,+136]mm · 6 of 144 slices shown]
[im 1/144]
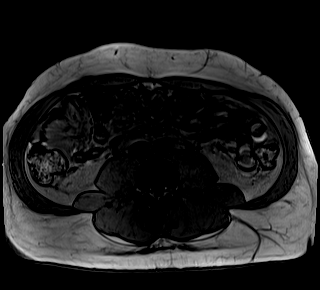
[im 29/144]
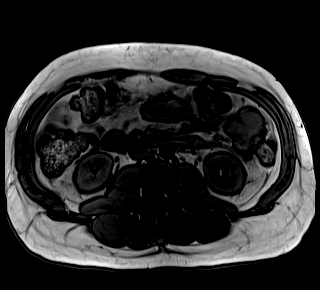
[im 58/144]
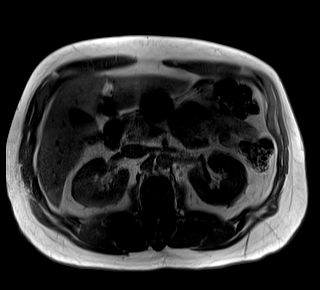
[im 86/144]
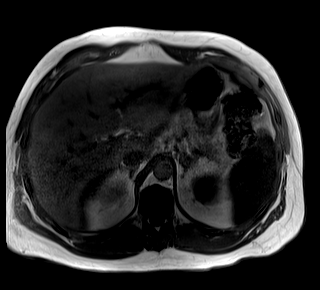
[im 115/144]
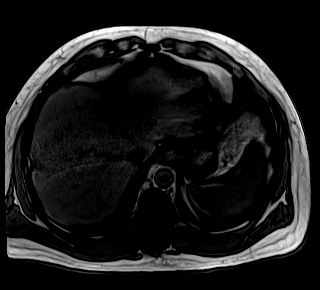
[im 144/144]
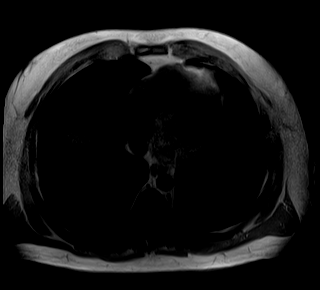

[Series 9: T2 · axial · 6.0mm · 1.22mm/px · 1 of 30 slices shown (3 of 3)]
[im 1/30]
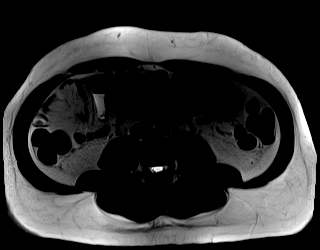

[Series 10: bSSFP · axial · 5.0mm · 1.25mm/px · z∈[-81,+141]mm · 2 of 38 slices shown]
[im 1/38]
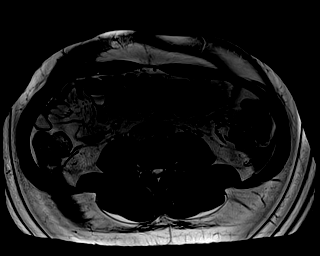
[im 38/38]
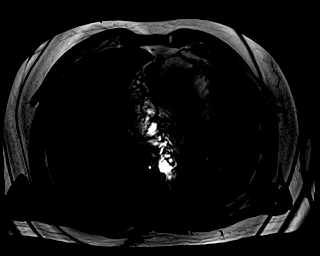

[Series 11: T1 dynamic · axial · non-contrast · 3.2mm · 1.25mm/px · z∈[-84,+143]mm · 3 of 72 slices shown]
[im 1/72]
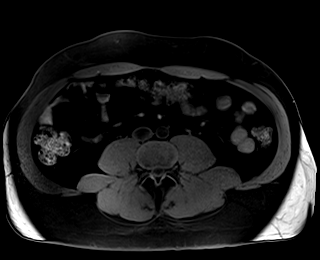
[im 36/72]
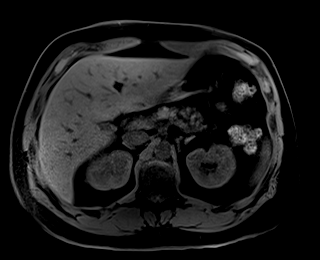
[im 72/72]
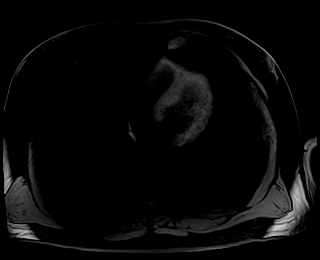

[Series 12: T1 dynamic post-contrast · axial · 3.2mm · 1.25mm/px · z∈[-84,+143]mm · 3 of 72 slices shown (1 of 9)]
[im 1/72]
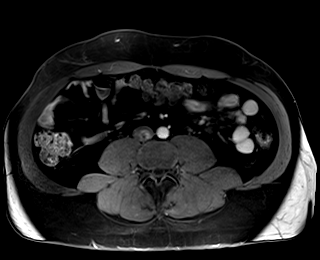
[im 36/72]
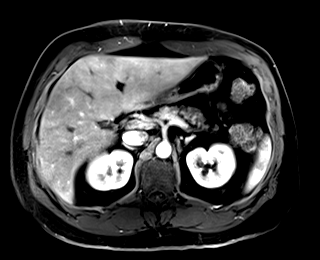
[im 72/72]
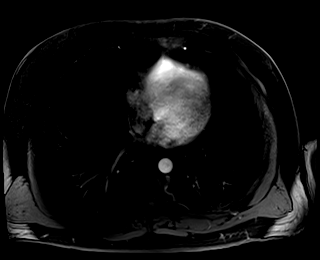

[Series 13: T1 dynamic post-contrast · axial · 3.2mm · 1.25mm/px · z∈[-84,+143]mm · 3 of 72 slices shown (2 of 9)]
[im 1/72]
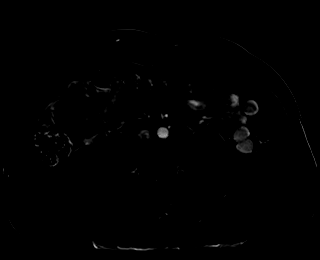
[im 36/72]
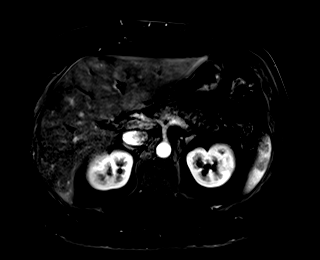
[im 72/72]
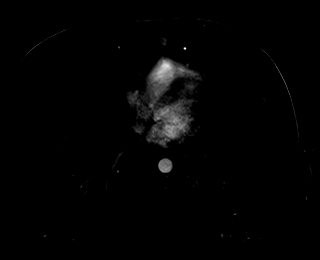

[Series 14: T1 dynamic post-contrast · axial · 3.2mm · 1.25mm/px · z∈[-84,+143]mm · 3 of 72 slices shown (3 of 9)]
[im 1/72]
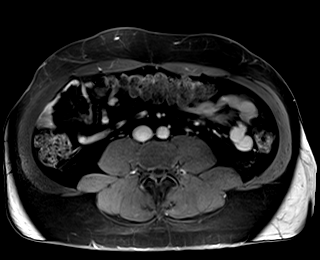
[im 36/72]
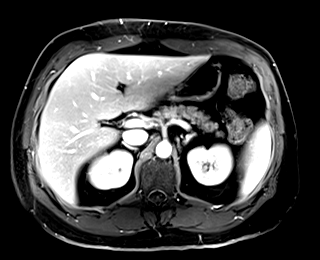
[im 72/72]
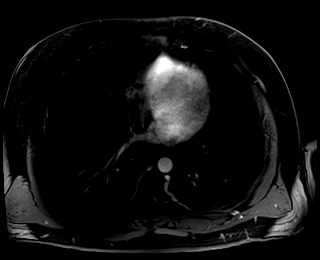

[Series 15: T1 dynamic post-contrast · axial · 3.2mm · 1.25mm/px · z∈[-84,+143]mm · 3 of 72 slices shown (4 of 9)]
[im 1/72]
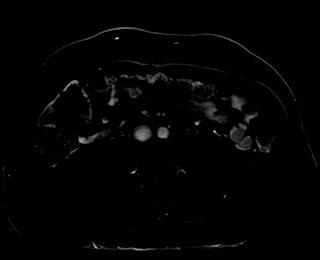
[im 36/72]
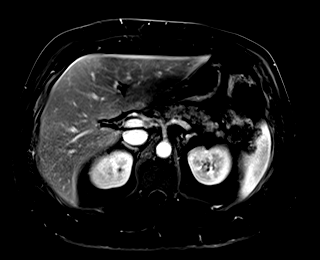
[im 72/72]
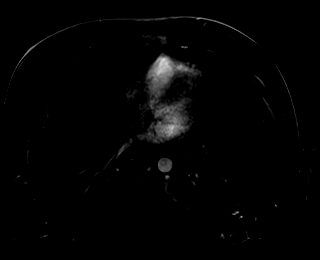

[Series 16: T1 dynamic post-contrast · axial · 3.2mm · 1.25mm/px · z∈[-84,+143]mm · 3 of 72 slices shown (5 of 9)]
[im 1/72]
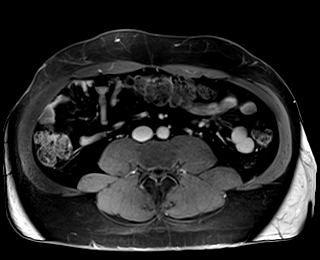
[im 36/72]
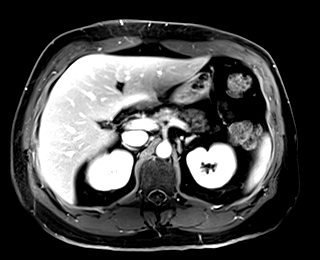
[im 72/72]
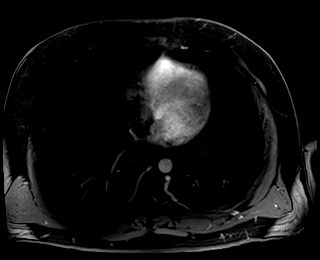

[Series 17: T1 dynamic post-contrast · axial · 3.2mm · 1.25mm/px · z∈[-84,+143]mm · 3 of 72 slices shown (6 of 9)]
[im 1/72]
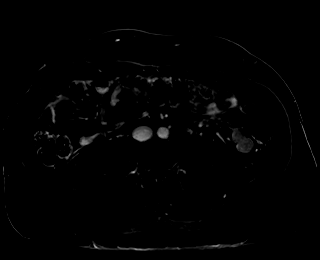
[im 36/72]
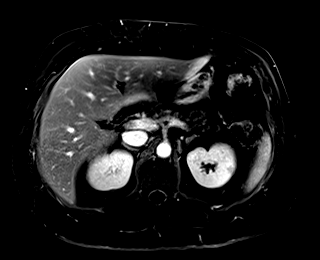
[im 72/72]
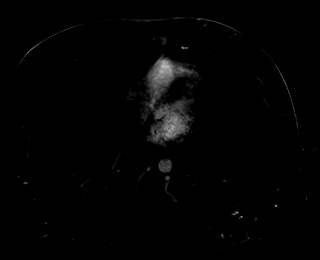

[Series 18: T1 dynamic post-contrast · coronal · 3.0mm · 1.25mm/px · 3 of 72 slices shown (7 of 9)]
[im 1/72]
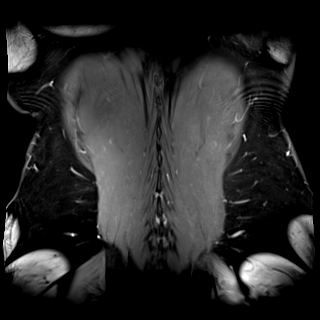
[im 36/72]
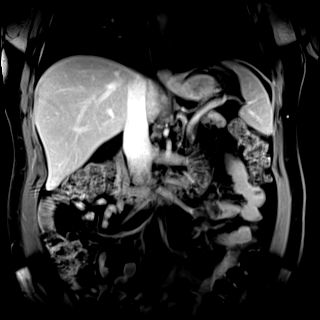
[im 72/72]
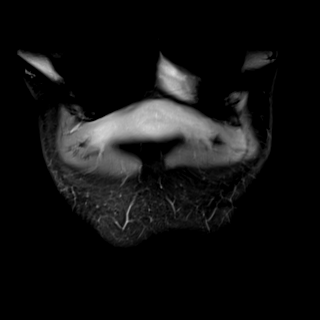

[Series 19: T1 dynamic post-contrast · axial · 3.2mm · 1.25mm/px · z∈[-84,+143]mm · 3 of 72 slices shown (8 of 9)]
[im 1/72]
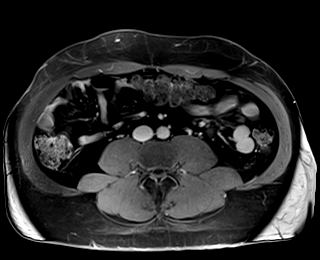
[im 36/72]
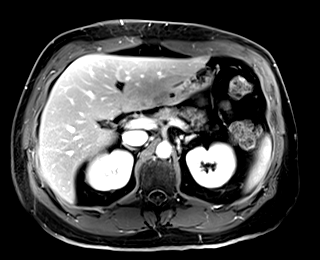
[im 72/72]
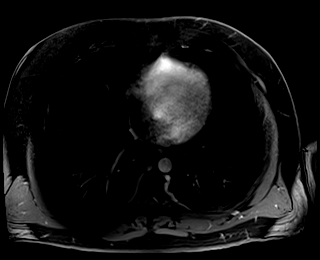

[Series 20: T1 dynamic post-contrast · axial · 3.2mm · 1.25mm/px · z∈[-84,+143]mm · 3 of 72 slices shown (9 of 9)]
[im 1/72]
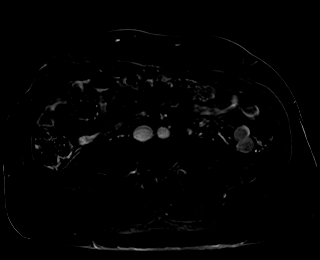
[im 36/72]
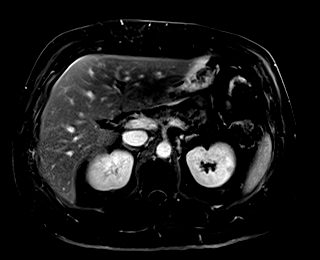
[im 72/72]
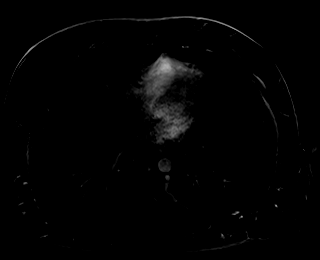

[48 of 48 positions shown; findings below may reference images not displayed]

FINDINGS: Lower chest: Normal heart size without pericardial or pleural
effusion.

Hepatobiliary: Arterial phase focus of hyperenhancement within the
high right hepatic lobe measures 8 mm and is likely a perfusion
anomaly. Normal gallbladder, without biliary ductal dilatation.

Pancreas:  Normal, without mass or ductal dilatation.

Spleen:  Normal in size, without focal abnormality.

Adrenals/Urinary Tract: Normal adrenal glands. Normal right kidney.

Corresponding to the CT abnormality, within the posterolateral
interpolar left kidney, is a 8 mm lesion which demonstrates
complex/heterogeneous T2 signal on image [DATE]. Minimal precontrast
T1 hyperintensity, including on series 11. No post-contrast
enhancement, including on subtracted images.

Stomach/Bowel: Normal stomach and abdominal bowel loops.

Vascular/Lymphatic: Aortic atherosclerosis. No retroperitoneal or
retrocrural adenopathy.

Other:  No ascites.

Musculoskeletal: No acute osseous abnormality.
IMPRESSION: 1. The lesion of concern, in the interpolar left kidney, is
consistent with a minimally complex cyst.
2. Aortic Atherosclerosis ([id number].O). This is significantly age
advanced.

## 2019-03-31 ENCOUNTER — Other Ambulatory Visit: Payer: Self-pay | Admitting: Primary Care

## 2019-03-31 DIAGNOSIS — K219 Gastro-esophageal reflux disease without esophagitis: Secondary | ICD-10-CM

## 2019-04-02 NOTE — Telephone Encounter (Signed)
Last prescribed on 03/06/2019. Last appointment on 03/06/2019. No future appointment

## 2019-04-02 NOTE — Telephone Encounter (Signed)
How's he doing on omeprazole 40 mg? Has it helped with his symptoms? If his symptoms are better then he can try stopping the medication. If his symptoms return then have him start omeprazole 20 mg.   If he's not better then let me know.

## 2019-04-02 NOTE — Telephone Encounter (Signed)
Tried to call patient but could not leave message. It stated that wireless is not available.

## 2019-04-06 MED ORDER — OMEPRAZOLE 20 MG PO CPDR: 20.0000 mg | DELAYED_RELEASE_CAPSULE | Freq: Every day | ORAL | 0 refills | Status: DC

## 2019-04-06 NOTE — Telephone Encounter (Signed)
Spoken and notified patient of Justin Baldwin comments. Patient verbalized understanding. Patient stated that he noticed that without it, the symptoms comes back. Patient is agreeable to the 20 mg. Please send to [facility]

## 2019-06-25 DIAGNOSIS — R399 Unspecified symptoms and signs involving the genitourinary system: Secondary | ICD-10-CM | POA: Diagnosis not present

## 2019-06-25 DIAGNOSIS — N5201 Erectile dysfunction due to arterial insufficiency: Secondary | ICD-10-CM | POA: Diagnosis not present

## 2019-07-06 ENCOUNTER — Other Ambulatory Visit: Payer: Self-pay | Admitting: Primary Care

## 2019-07-06 DIAGNOSIS — K219 Gastro-esophageal reflux disease without esophagitis: Secondary | ICD-10-CM

## 2019-07-10 DIAGNOSIS — D229 Melanocytic nevi, unspecified: Secondary | ICD-10-CM | POA: Diagnosis not present

## 2019-07-10 DIAGNOSIS — L918 Other hypertrophic disorders of the skin: Secondary | ICD-10-CM | POA: Diagnosis not present

## 2019-07-10 DIAGNOSIS — L732 Hidradenitis suppurativa: Secondary | ICD-10-CM | POA: Diagnosis not present

## 2019-09-03 ENCOUNTER — Encounter (HOSPITAL_COMMUNITY): Payer: Self-pay | Admitting: Emergency Medicine

## 2019-09-03 ENCOUNTER — Other Ambulatory Visit: Payer: Self-pay

## 2019-09-03 ENCOUNTER — Ambulatory Visit (INDEPENDENT_AMBULATORY_CARE_PROVIDER_SITE_OTHER): Payer: BC Managed Care – PPO | Admitting: Primary Care

## 2019-09-03 ENCOUNTER — Encounter: Payer: Self-pay | Admitting: Primary Care

## 2019-09-03 ENCOUNTER — Observation Stay (HOSPITAL_COMMUNITY)
Admission: EM | Admit: 2019-09-03 | Discharge: 2019-09-04 | Disposition: A | Discharge status: Home / Self Care | Payer: BC Managed Care – PPO | Attending: Internal Medicine | Admitting: Internal Medicine

## 2019-09-03 ENCOUNTER — Telehealth: Payer: Self-pay

## 2019-09-03 ENCOUNTER — Telehealth: Payer: Self-pay | Admitting: Primary Care

## 2019-09-03 ENCOUNTER — Emergency Department (HOSPITAL_COMMUNITY): Payer: BC Managed Care – PPO

## 2019-09-03 VITALS — BP 130/86 | HR 89 | Temp 98.0°F | Ht 73.0 in | Wt 234.5 lb

## 2019-09-03 DIAGNOSIS — E1165 Type 2 diabetes mellitus with hyperglycemia: Principal | ICD-10-CM | POA: Insufficient documentation

## 2019-09-03 DIAGNOSIS — E139 Other specified diabetes mellitus without complications: Secondary | ICD-10-CM

## 2019-09-03 DIAGNOSIS — E119 Type 2 diabetes mellitus without complications: Secondary | ICD-10-CM | POA: Diagnosis not present

## 2019-09-03 DIAGNOSIS — R739 Hyperglycemia, unspecified: Secondary | ICD-10-CM | POA: Diagnosis present

## 2019-09-03 DIAGNOSIS — Z79899 Other long term (current) drug therapy: Secondary | ICD-10-CM | POA: Diagnosis not present

## 2019-09-03 DIAGNOSIS — E785 Hyperlipidemia, unspecified: Secondary | ICD-10-CM | POA: Diagnosis not present

## 2019-09-03 DIAGNOSIS — R5383 Other fatigue: Secondary | ICD-10-CM | POA: Diagnosis not present

## 2019-09-03 DIAGNOSIS — F17210 Nicotine dependence, cigarettes, uncomplicated: Secondary | ICD-10-CM | POA: Insufficient documentation

## 2019-09-03 DIAGNOSIS — R350 Frequency of micturition: Secondary | ICD-10-CM | POA: Diagnosis not present

## 2019-09-03 DIAGNOSIS — Z20828 Contact with and (suspected) exposure to other viral communicable diseases: Secondary | ICD-10-CM | POA: Diagnosis not present

## 2019-09-03 DIAGNOSIS — E871 Hypo-osmolality and hyponatremia: Secondary | ICD-10-CM

## 2019-09-03 HISTORY — DX: Type 2 diabetes mellitus without complications: E11.9

## 2019-09-03 LAB — LIPID PANEL
Cholesterol: 350 mg/dL — ABNORMAL HIGH (ref 0–200)
HDL: 18.7 mg/dL — ABNORMAL LOW (ref >39.00)
Total CHOL/HDL Ratio: 19
Triglycerides: 471 mg/dL — ABNORMAL HIGH (ref 0.0–149.0)

## 2019-09-03 LAB — BASIC METABOLIC PANEL
BUN: 14 mg/dL (ref 6–23)
CO2: 31 mEq/L (ref 19–32)
Calcium: 9.5 mg/dL (ref 8.4–10.5)
Chloride: 87 mEq/L — ABNORMAL LOW (ref 96–112)
Creatinine, Ser: 1.41 mg/dL (ref 0.40–1.50)
GFR: 54.81 mL/min — ABNORMAL LOW (ref >60.00)
Glucose, Bld: 545 mg/dL (ref 70–99)
Potassium: 6 mEq/L — ABNORMAL HIGH (ref 3.5–5.1)
Sodium: 124 mEq/L — ABNORMAL LOW (ref 135–145)

## 2019-09-03 LAB — URINALYSIS, ROUTINE W REFLEX MICROSCOPIC
Bacteria, UA: NONE SEEN
Bilirubin Urine: NEGATIVE
Glucose, UA: >=500 mg/dL — AB
Ketones, ur: 5 mg/dL — AB
Leukocytes,Ua: NEGATIVE
Nitrite: NEGATIVE
Protein, ur: NEGATIVE mg/dL
Specific Gravity, Urine: 1.032 — ABNORMAL HIGH (ref 1.005–1.030)
pH: 6 (ref 5.0–8.0)

## 2019-09-03 LAB — RAPID URINE DRUG SCREEN, HOSP PERFORMED
Amphetamines: NOT DETECTED
Barbiturates: NOT DETECTED
Benzodiazepines: NOT DETECTED
Cocaine: NOT DETECTED
Opiates: NOT DETECTED
Tetrahydrocannabinol: NOT DETECTED

## 2019-09-03 LAB — CBC WITH DIFFERENTIAL/PLATELET
Abs Immature Granulocytes: 0.04 10*3/uL (ref 0.00–0.07)
Basophils Absolute: 0.1 10*3/uL (ref 0.0–0.1)
Basophils Absolute: 0.1 10*3/uL (ref 0.0–0.1)
Basophils Relative: 0.8 % (ref 0.0–3.0)
Basophils Relative: 1 %
Eosinophils Absolute: 0 10*3/uL (ref 0.0–0.7)
Eosinophils Absolute: 0 10*3/uL (ref 0.0–0.5)
Eosinophils Relative: 0.6 % (ref 0.0–5.0)
Eosinophils Relative: 0 %
HCT: 54.4 % — ABNORMAL HIGH (ref 39.0–52.0)
HCT: 51.9 % (ref 39.0–52.0)
Hemoglobin: 18.2 g/dL (ref 13.0–17.0)
Hemoglobin: 17.4 g/dL — ABNORMAL HIGH (ref 13.0–17.0)
Immature Granulocytes: 0 %
Lymphocytes Relative: 30.9 % (ref 12.0–46.0)
Lymphocytes Relative: 29 %
Lymphs Abs: 2.4 10*3/uL (ref 0.7–4.0)
Lymphs Abs: 2.7 10*3/uL (ref 0.7–4.0)
MCH: 30.3 pg (ref 26.0–34.0)
MCHC: 33.4 g/dL (ref 30.0–36.0)
MCHC: 33.5 g/dL (ref 30.0–36.0)
MCV: 91.6 fl (ref 78.0–100.0)
MCV: 90.4 fL (ref 80.0–100.0)
Monocytes Absolute: 0.6 10*3/uL (ref 0.1–1.0)
Monocytes Absolute: 0.7 10*3/uL (ref 0.1–1.0)
Monocytes Relative: 7 % (ref 3.0–12.0)
Monocytes Relative: 8 %
Neutro Abs: 4.8 10*3/uL (ref 1.4–7.7)
Neutro Abs: 5.8 10*3/uL (ref 1.7–7.7)
Neutrophils Relative %: 60.7 % (ref 43.0–77.0)
Neutrophils Relative %: 62 %
Platelets: 227 10*3/uL (ref 150.0–400.0)
Platelets: 253 10*3/uL (ref 150–400)
RBC: 5.94 Mil/uL — ABNORMAL HIGH (ref 4.22–5.81)
RBC: 5.74 MIL/uL (ref 4.22–5.81)
RDW: 14.1 % (ref 11.5–15.5)
RDW: 13.2 % (ref 11.5–15.5)
WBC: 7.9 10*3/uL (ref 4.0–10.5)
WBC: 9.4 10*3/uL (ref 4.0–10.5)
nRBC: 0 % (ref 0.0–0.2)

## 2019-09-03 LAB — COMPREHENSIVE METABOLIC PANEL
ALT: 57 U/L — ABNORMAL HIGH (ref 0–44)
AST: 36 U/L (ref 15–41)
Albumin: 3.9 g/dL (ref 3.5–5.0)
Alkaline Phosphatase: 41 U/L (ref 38–126)
Anion gap: 11 (ref 5–15)
BUN: 17 mg/dL (ref 6–20)
CO2: 26 mmol/L (ref 22–32)
Calcium: 8.7 mg/dL — ABNORMAL LOW (ref 8.9–10.3)
Chloride: 88 mmol/L — ABNORMAL LOW (ref 98–111)
Creatinine, Ser: 1.52 mg/dL — ABNORMAL HIGH (ref 0.61–1.24)
GFR calc Af Amer: >60 mL/min (ref >60)
GFR calc non Af Amer: 55 mL/min — ABNORMAL LOW (ref >60)
Glucose, Bld: 406 mg/dL — ABNORMAL HIGH (ref 70–99)
Potassium: 4.2 mmol/L (ref 3.5–5.1)
Sodium: 125 mmol/L — ABNORMAL LOW (ref 135–145)
Total Bilirubin: 1.6 mg/dL — ABNORMAL HIGH (ref 0.3–1.2)
Total Protein: 7.7 g/dL (ref 6.5–8.1)

## 2019-09-03 LAB — TROPONIN I (HIGH SENSITIVITY)
Troponin I (High Sensitivity): 20 ng/L — ABNORMAL HIGH (ref <18)
Troponin I (High Sensitivity): 22 ng/L — ABNORMAL HIGH (ref <18)

## 2019-09-03 LAB — POC URINALSYSI DIPSTICK (AUTOMATED)
Bilirubin, UA: NEGATIVE
Blood, UA: POSITIVE
Glucose, UA: POSITIVE — AB
Ketones, UA: POSITIVE
Leukocytes, UA: NEGATIVE
Nitrite, UA: NEGATIVE
Protein, UA: NEGATIVE
Spec Grav, UA: 1.015 (ref 1.010–1.025)
Urobilinogen, UA: 0.2 E.U./dL
pH, UA: 6 (ref 5.0–8.0)

## 2019-09-03 LAB — CBG MONITORING, ED
Glucose-Capillary: 437 mg/dL — ABNORMAL HIGH (ref 70–99)
Glucose-Capillary: 318 mg/dL — ABNORMAL HIGH (ref 70–99)
Glucose-Capillary: 246 mg/dL — ABNORMAL HIGH (ref 70–99)
Glucose-Capillary: 230 mg/dL — ABNORMAL HIGH (ref 70–99)

## 2019-09-03 LAB — POCT GLYCOSYLATED HEMOGLOBIN (HGB A1C): Hemoglobin A1C: 13.3 % — AB (ref 4.0–5.6)

## 2019-09-03 LAB — MICROALBUMIN / CREATININE URINE RATIO
Creatinine,U: 70.8 mg/dL
Microalb Creat Ratio: 3.5 mg/g (ref 0.0–30.0)
Microalb, Ur: 2.5 mg/dL — ABNORMAL HIGH (ref 0.0–1.9)

## 2019-09-03 LAB — LDL CHOLESTEROL, DIRECT: Direct LDL: 286 mg/dL

## 2019-09-03 MED ORDER — INSULIN ASPART 100 UNIT/ML ~~LOC~~ SOLN
7.0000 [IU] | Freq: Once | SUBCUTANEOUS | Status: AC
  Administered 2019-09-03: 7 [IU] via SUBCUTANEOUS
  Filled 2019-09-03: qty 1

## 2019-09-03 MED ORDER — POTASSIUM CHLORIDE IN NACL 20-0.9 MEQ/L-% IV SOLN
INTRAVENOUS | Status: DC
  Administered 2019-09-03 – 2019-09-04 (×2): via INTRAVENOUS
  Filled 2019-09-03 (×2): qty 1000

## 2019-09-03 MED ORDER — ACETAMINOPHEN 325 MG PO TABS
650.0000 mg | ORAL_TABLET | Freq: Four times a day (QID) | ORAL | Status: DC | PRN
  Administered 2019-09-03: 650 mg via ORAL
  Filled 2019-09-03: qty 2

## 2019-09-03 MED ORDER — ONDANSETRON HCL 4 MG/2ML IJ SOLN: 4.0000 mg | Freq: Four times a day (QID) | INTRAMUSCULAR | Status: DC | PRN

## 2019-09-03 MED ORDER — PANTOPRAZOLE SODIUM 40 MG PO TBEC
40.0000 mg | DELAYED_RELEASE_TABLET | Freq: Every day | ORAL | Status: DC
  Administered 2019-09-04: 40 mg via ORAL
  Filled 2019-09-03: qty 1

## 2019-09-03 MED ORDER — NICOTINE 14 MG/24HR TD PT24
14.0000 mg | MEDICATED_PATCH | Freq: Every day | TRANSDERMAL | Status: DC
  Administered 2019-09-03 – 2019-09-04 (×2): 14 mg via TRANSDERMAL
  Filled 2019-09-03 (×2): qty 1

## 2019-09-03 MED ORDER — SODIUM CHLORIDE 0.9 % IV BOLUS (SEPSIS)
1000.0000 mL | Freq: Once | INTRAVENOUS | Status: AC
  Administered 2019-09-03: 1000 mL via INTRAVENOUS

## 2019-09-03 MED ORDER — SODIUM CHLORIDE 0.9 % IV SOLN
1000.0000 mL | INTRAVENOUS | Status: DC
  Administered 2019-09-03: 1000 mL via INTRAVENOUS

## 2019-09-03 MED ORDER — POLYETHYLENE GLYCOL 3350 17 G PO PACK: 17.0000 g | PACK | Freq: Every day | ORAL | Status: DC | PRN

## 2019-09-03 MED ORDER — ACETAMINOPHEN 650 MG RE SUPP: 650.0000 mg | Freq: Four times a day (QID) | RECTAL | Status: DC | PRN

## 2019-09-03 MED ORDER — ENOXAPARIN SODIUM 40 MG/0.4ML ~~LOC~~ SOLN
40.0000 mg | SUBCUTANEOUS | Status: DC
  Administered 2019-09-03: 40 mg via SUBCUTANEOUS
  Filled 2019-09-03: qty 0.4

## 2019-09-03 MED ORDER — ONDANSETRON HCL 4 MG PO TABS: 4.0000 mg | ORAL_TABLET | Freq: Four times a day (QID) | ORAL | Status: DC | PRN

## 2019-09-03 MED ORDER — SODIUM CHLORIDE 0.9 % IV BOLUS
1000.0000 mL | Freq: Once | INTRAVENOUS | Status: AC
  Administered 2019-09-03: 1000 mL via INTRAVENOUS

## 2019-09-03 MED ORDER — ATORVASTATIN CALCIUM 40 MG PO TABS
40.0000 mg | ORAL_TABLET | Freq: Every day | ORAL | Status: DC
  Administered 2019-09-04: 40 mg via ORAL
  Filled 2019-09-03: qty 1

## 2019-09-03 MED ORDER — INSULIN GLARGINE 100 UNIT/ML ~~LOC~~ SOLN
10.0000 [IU] | Freq: Every day | SUBCUTANEOUS | Status: DC
  Filled 2019-09-03 (×2): qty 0.1

## 2019-09-03 MED ORDER — BLOOD GLUCOSE MONITOR KIT: PACK | 0 refills | Status: DC

## 2019-09-03 MED ORDER — INSULIN ASPART 100 UNIT/ML ~~LOC~~ SOLN
0.0000 [IU] | SUBCUTANEOUS | Status: DC
  Administered 2019-09-03: 3 [IU] via SUBCUTANEOUS
  Administered 2019-09-04: 2 [IU] via SUBCUTANEOUS
  Administered 2019-09-04: 3 [IU] via SUBCUTANEOUS
  Administered 2019-09-04: 5 [IU] via SUBCUTANEOUS
  Administered 2019-09-04: 3 [IU] via SUBCUTANEOUS
  Filled 2019-09-03 (×3): qty 1

## 2019-09-03 MED ORDER — METFORMIN HCL 1000 MG PO TABS: 1000.0000 mg | ORAL_TABLET | Freq: Two times a day (BID) | ORAL | 1 refills | Status: DC

## 2019-09-03 NOTE — Telephone Encounter (Signed)
Patient notified of results via phone. He was advised to see treatment in the ED due to potential DKA.

## 2019-09-03 NOTE — H&P (Addendum)
History and Physical    Justin Baldwin QPY:[id number] DOB: [birth year] DOA: 09/03/2019  PCP: Pleas [name], NP   Patient coming from: Home  I have personally briefly reviewed patient's old medical records in [place]  Chief Complaint: Elevated blood sugar  HPI: Justin Baldwin is a 43 y.o. male with medical history significant for hidradenitis, atypical mycobacterial infection, now newly diagnosed diabetic.  Patient saw his primary care provider today, with complaints of polyuria and polydipsia for 1 month.  HgbA1c checked and was 13.3.  UA showed ketones and glucose.  BMP subsequently ordered showed glucose of 545.  Patient was sent to the ED. Denies family history of diabetes.  Drinks 1 alcoholic drink on workdays- ~5 days per week.  1 bottle of beer or 1 mixed drinks.  Denies history of alcohol withdrawal.  No Covid positive contacts.  ED Course: Stable vitals.  Glucose 406, anion gap 11, serum bicarb 26.  Sodium 125.  Creatinine 1.52, close to baseline.  EKG showed sinus rhythm with nonspecific abnormalities.  Troponin mildly elevated at 20.  2L IV fluid given in ED.  Hospitalist admit for further evaluation and management.  Review of Systems: As per HPI all other systems reviewed and negative.  Past Medical History:  Diagnosis Date  . Diabetes mellitus without complication ([place])   . Erectile dysfunction   . Insomnia   . Skin abnormality    hidradenitis suppurativa     Past Surgical History:  Procedure Laterality Date  . IRRIGATION AND DEBRIDEMENT ABSCESS N/A 09/22/2018   Procedure: IRRIGATION AND DEBRIDEMENT ABSCESS;  Surgeon: Ralene Ok, MD;  Location: [facility];  Service: General;  Laterality: N/A;     reports that he has been smoking cigarettes. He has been smoking about 1.00 pack per day. He quit smokeless tobacco use about 3 years ago. He reports current alcohol use of about 5.0 standard drinks of alcohol per week. He reports that he does not use drugs.  No  Known Allergies  Family History  Problem Relation Age of Onset  . Heart attack Father   . Emphysema Paternal Grandmother   . Arthritis Mother     Prior to Admission medications   Medication Sig Start Date End Date Taking? Authorizing Provider  atorvastatin (LIPITOR) 40 MG tablet Take 1 tablet (40 mg total) by mouth daily. For cholesterol. 12/05/18  Yes Pleas [name], NP  omeprazole (PRILOSEC) 20 MG capsule TAKE 1 CAPSULE(20 MG) BY MOUTH DAILY FOR HEARTBURN Patient taking differently: Take 20 mg by mouth daily.  07/06/19  Yes Pleas [name], NP  oxymetazoline (AFRIN) 0.05 % nasal spray Place 1-2 sprays into both nostrils as needed for congestion.    Yes [provider]  tadalafil (CIALIS) 5 MG tablet Take by mouth. 06/25/19  Yes [provider]  blood glucose meter kit and supplies KIT Dispense based on patient and insurance preference. Use up to four times daily as directed. (FOR ICD-9 250.00, 250.01). 09/03/19   Pleas [name], NP  metFORMIN (GLUCOPHAGE) 1000 MG tablet Take 1 tablet (1,000 mg total) by mouth 2 (two) times daily with a meal. For diabetes. 09/03/19   Pleas [name], NP    Physical Exam: Vitals:   09/03/19 1630 09/03/19 1645 09/03/19 1700 09/03/19 1745  BP: (!) 157/89     Pulse: 81 80 77 75  Resp: 14 19 20 19   Temp:      TempSrc:      SpO2: 97% 97% 96% 98%  Weight:      Height:        Constitutional: NAD, calm, comfortable Vitals:   09/03/19 1630 09/03/19 1645 09/03/19 1700 09/03/19 1745  BP: (!) 157/89     Pulse: 81 80 77 75  Resp: 14 19 20 19   Temp:      TempSrc:      SpO2: 97% 97% 96% 98%  Weight:      Height:       Eyes: PERRL, lids and conjunctivae normal ENMT: Mucous membranes are moist. Posterior pharynx clear of any exudate or lesions.Normal dentition.  Neck: normal, supple, no masses, no thyromegaly Respiratory: clear to auscultation bilaterally, no wheezing, no crackles. Normal respiratory effort. No accessory  muscle use.  Cardiovascular: Regular rate and rhythm, no murmurs / rubs / gallops. No extremity edema. 2+ pedal pulses. No carotid bruits.  Abdomen: no tenderness, no masses palpated. No hepatosplenomegaly. Bowel sounds positive.  Musculoskeletal: no clubbing / cyanosis. No joint deformity upper and lower extremities. Good ROM, no contractures. Normal muscle tone.  Skin: Tattoos, no rashes, lesions, ulcers. No induration Neurologic: CN 2-12 grossly intact. Sensation intact, DTR normal. Strength 5/5 in all 4.  Psychiatric: Normal judgment and insight. Alert and oriented x 3. Normal mood.   Labs on Admission: I have personally reviewed following labs and imaging studies  CBC: Recent Labs  Lab 09/03/19 0927 09/03/19 1625  WBC 7.9 9.4  NEUTROABS 4.8 5.8  HGB 18.2 Repeated and verified X2.* 17.4*  HCT 54.4 Repeated and verified X2.* 51.9  MCV 91.6 90.4  PLT 227.0 758   Basic Metabolic Panel: Recent Labs  Lab 09/03/19 0927 09/03/19 1625  NA 124* 125*  K 6.0* 4.2  CL 87* 88*  CO2 31 26  GLUCOSE 545* 406*  BUN 14 17  CREATININE 1.41 1.52*  CALCIUM 9.5 8.7*   Liver Function Tests: Recent Labs  Lab 09/03/19 1625  AST 36  ALT 57*  ALKPHOS 41  BILITOT 1.6*  PROT 7.7  ALBUMIN 3.9   HbA1C: Recent Labs    09/03/19 0900  HGBA1C 13.3*   CBG: Recent Labs  Lab 09/03/19 1527 09/03/19 1721  GLUCAP 437* 318*   Lipid Profile: Recent Labs    09/03/19 0927  CHOL 350*  HDL 18.70*  TRIG 471.0 Triglyceride is over 400; calculations on Lipids are invalid.*  CHOLHDL 19  LDLDIRECT 286.0   Urine analysis:    Component Value Date/Time   COLORURINE YELLOW 09/03/2019 1542   APPEARANCEUR CLEAR 09/03/2019 1542   LABSPEC 1.032 (H) 09/03/2019 1542   PHURINE 6.0 09/03/2019 1542   GLUCOSEU >=500 (A) 09/03/2019 1542   HGBUR MODERATE (A) 09/03/2019 1542   BILIRUBINUR NEGATIVE 09/03/2019 1542   BILIRUBINUR Negative 09/03/2019 0910   KETONESUR 5 (A) 09/03/2019 1542   PROTEINUR  NEGATIVE 09/03/2019 1542   UROBILINOGEN 0.2 09/03/2019 0910   NITRITE NEGATIVE 09/03/2019 1542   LEUKOCYTESUR NEGATIVE 09/03/2019 1542    Radiological Exams on Admission: Dg Chest Portable 1 View  Result Date: 09/03/2019 CLINICAL DATA:  New onset diabetes EXAM: PORTABLE CHEST 1 VIEW COMPARISON:  11/01/2005 FINDINGS: No focal airspace disease or effusion. Normal heart size. Accentuated vascular opacities. No edema. No pneumothorax. IMPRESSION: No active disease. Electronically Signed   By: Donavan Foil M.D.   On: 09/03/2019 16:41    EKG: Independently reviewed.  Sinus rhythm, QTC 421, T wave abnormalities in lead III and aVF are old but slightly more pronounced today.  Assessment/Plan Active Problems:   Hyperglycemia  Hyperglycemia newly diagnosed diabetic- glucose here 406 downtrend to 318 just with hydration.  Hemoglobin A1c 13.3.  Hemoglobin 17.4 suggesting hemoconcentration. Started on Metformin by PCP.  -SSI q4H -Hold Metformin while inpatient May resume on discharge -Diabetic coordinator consult for education -Considering how high his hemoglobin A1c is, will start him on Lantus 10 units nightly -2 L bolus given, give additional 1L , then continue N/s + 20 Kcl 100cc/hr  - BMP, CBC a.m  Pseudohyponatremia-sodium 125, corrected sodium for hyperglycemia - 134.  - Hydrate. - BMP a.m  Elevated troponin- 20, EKG with slightly more pronounced abnormalities.  No chest pain or difficulty breathing. -Trend troponin  Tobacco abuse-smokes 1 pack of cigarettes daily -Nicotine patch   DVT prophylaxis: Lovenox Code Status: Full code Family Communication: None at bedside Disposition Plan: 1 to 2 days Consults called: None Admission status: Obs, MedSurg  Bethena Roys MD [facility]  09/03/2019, 7:25 PM

## 2019-09-03 NOTE — Telephone Encounter (Signed)
[facility] called again to report another critical result @ 1405  Glucose 545 Potassium 6.0

## 2019-09-03 NOTE — ED Notes (Signed)
Pt given heart healthy meal

## 2019-09-03 NOTE — Telephone Encounter (Signed)
[facility] called @ 1354 to result a critical lab result  Hemoglobin 18.2 Hematocrit 54.5

## 2019-09-03 NOTE — ED Triage Notes (Signed)
Pt sent from his PCP for possible DKA he was just dx with DM.

## 2019-09-03 NOTE — Telephone Encounter (Signed)
Patient went to [facility]..  Patient needs a rx for diabetic supplies.

## 2019-09-03 NOTE — Patient Instructions (Addendum)
Stop by the lab prior to leaving today. I will notify you of your results once received.   Start metformin 1000 mg tablets for diabetes. Take 1 tablet by mouth with breakfast for two weeks, then increase to 1 tablet twice daily thereafter.  Start checking your blood sugar levels.  Appropriate times to check your blood sugar levels are:  -Before any meal (breakfast, lunch, dinner) -Two hours after any meal (breakfast, lunch, dinner) -Bedtime  Record your readings and notify me if you continue to consistently run at or above 200 after one month.   It is important that you improve your diet. Please limit carbohydrates in the form of white bread, rice, pasta, sweets, fast food, fried food, sugary drinks, etc. Increase your consumption of fresh fruits and vegetables, whole grains, lean protein.  Ensure you are consuming 64 ounces of water daily.  Please schedule a follow up appointment in 3 months for diabetes check.  It was a pleasure to see you today!    Diabetes Mellitus and Nutrition, Adult When you have diabetes (diabetes mellitus), it is very important to have healthy eating habits because your blood sugar (glucose) levels are greatly affected by what you eat and drink. Eating healthy foods in the appropriate amounts, at about the same times every day, can help you:  Control your blood glucose.  Lower your risk of heart disease.  Improve your blood pressure.  Reach or maintain a healthy weight. Every person with diabetes is different, and each person has different needs for a meal plan. Your health care provider may recommend that you work with a diet and nutrition specialist (dietitian) to make a meal plan that is best for you. Your meal plan may vary depending on factors such as:  The calories you need.  The medicines you take.  Your weight.  Your blood glucose, blood pressure, and cholesterol levels.  Your activity level.  Other health conditions you have, such as  heart or kidney disease. How do carbohydrates affect me? Carbohydrates, also called carbs, affect your blood glucose level more than any other type of food. Eating carbs naturally raises the amount of glucose in your blood. Carb counting is a method for keeping track of how many carbs you eat. Counting carbs is important to keep your blood glucose at a healthy level, especially if you use insulin or take certain oral diabetes medicines. It is important to know how many carbs you can safely have in each meal. This is different for every person. Your dietitian can help you calculate how many carbs you should have at each meal and for each snack. Foods that contain carbs include:  Bread, cereal, rice, pasta, and crackers.  Potatoes and corn.  Peas, beans, and lentils.  Milk and yogurt.  Fruit and juice.  Desserts, such as cakes, cookies, ice cream, and candy. How does alcohol affect me? Alcohol can cause a sudden decrease in blood glucose (hypoglycemia), especially if you use insulin or take certain oral diabetes medicines. Hypoglycemia can be a life-threatening condition. Symptoms of hypoglycemia (sleepiness, dizziness, and confusion) are similar to symptoms of having too much alcohol. If your health care provider says that alcohol is safe for you, follow these guidelines:  Limit alcohol intake to no more than 1 drink per day for nonpregnant women and 2 drinks per day for men. One drink equals 12 oz of beer, 5 oz of wine, or 1 oz of hard liquor.  Do not drink on an empty stomach.  Keep yourself hydrated with water, diet soda, or unsweetened iced tea.  Keep in mind that regular soda, juice, and other mixers may contain a lot of sugar and must be counted as carbs. What are tips for following this plan?  Reading food labels  Start by checking the serving size on the "Nutrition Facts" label of packaged foods and drinks. The amount of calories, carbs, fats, and other nutrients listed on the  label is based on one serving of the item. Many items contain more than one serving per package.  Check the total grams (g) of carbs in one serving. You can calculate the number of servings of carbs in one serving by dividing the total carbs by 15. For example, if a food has 30 g of total carbs, it would be equal to 2 servings of carbs.  Check the number of grams (g) of saturated and trans fats in one serving. Choose foods that have low or no amount of these fats.  Check the number of milligrams (mg) of salt (sodium) in one serving. Most people should limit total sodium intake to less than 2,300 mg per day.  Always check the nutrition information of foods labeled as "low-fat" or "nonfat". These foods may be higher in added sugar or refined carbs and should be avoided.  Talk to your dietitian to identify your daily goals for nutrients listed on the label. Shopping  Avoid buying canned, premade, or processed foods. These foods tend to be high in fat, sodium, and added sugar.  Shop around the outside edge of the grocery store. This includes fresh fruits and vegetables, bulk grains, fresh meats, and fresh dairy. Cooking  Use low-heat cooking methods, such as baking, instead of high-heat cooking methods like deep frying.  Cook using healthy oils, such as olive, canola, or sunflower oil.  Avoid cooking with butter, cream, or high-fat meats. Meal planning  Eat meals and snacks regularly, preferably at the same times every day. Avoid going long periods of time without eating.  Eat foods high in fiber, such as fresh fruits, vegetables, beans, and whole grains. Talk to your dietitian about how many servings of carbs you can eat at each meal.  Eat 4-6 ounces (oz) of lean protein each day, such as lean meat, chicken, fish, eggs, or tofu. One oz of lean protein is equal to: ? 1 oz of meat, chicken, or fish. ? 1 egg. ?  cup of tofu.  Eat some foods each day that contain healthy fats, such as  avocado, nuts, seeds, and fish. Lifestyle  Check your blood glucose regularly.  Exercise regularly as told by your health care provider. This may include: ? 150 minutes of moderate-intensity or vigorous-intensity exercise each week. This could be brisk walking, biking, or water aerobics. ? Stretching and doing strength exercises, such as yoga or weightlifting, at least 2 times a week.  Take medicines as told by your health care provider.  Do not use any products that contain nicotine or tobacco, such as cigarettes and e-cigarettes. If you need help quitting, ask your health care provider.  Work with a Social worker or diabetes educator to identify strategies to manage stress and any emotional and social challenges. Questions to ask a health care provider  Do I need to meet with a diabetes educator?  Do I need to meet with a dietitian?  What number can I call if I have questions?  When are the best times to check my blood glucose? Where to find more  information:  American Diabetes Association: diabetes.org  Academy of Nutrition and Dietetics: www.eatright.[employer] of Diabetes and Digestive and Kidney Diseases (NIH): [employer] Summary  A healthy meal plan will help you control your blood glucose and maintain a healthy lifestyle.  Working with a diet and nutrition specialist (dietitian) can help you make a meal plan that is best for you.  Keep in mind that carbohydrates (carbs) and alcohol have immediate effects on your blood glucose levels. It is important to count carbs and to use alcohol carefully. This information is not intended to replace advice given to you by your health care provider. Make sure you discuss any questions you have with your health care provider. Document Released: 07/08/2005 Document Revised: 09/23/2017 Document Reviewed: 11/15/2016 Elsevier Patient Education  2020 [employer].

## 2019-09-03 NOTE — ED Provider Notes (Signed)
Medical screening examination/treatment/procedure(s) were conducted as a shared visit with non-physician practitioner(s) and myself.  I personally evaluated the patient during the encounter.  Clinical Impression:   Final diagnoses:  Hyperglycemia  Hyponatremia  Other specified diabetes mellitus without complication, without long-term current use of insulin [facility])    This patient is a 43 year old male, presents with increasing urinary frequency as well as increasing thirst, was seen at his family doctor's office today where he was found to be hyperglycemic with signs of diabetic ketoacidosis including lots of ketones in the urine.  He reports that he has no nausea or vomiting, no pain, on my exam he has a very soft abdomen, he is not tachycardic, he has dry the mucous membranes.  Labs were evaluated from his prehospital evaluation show a hemoconcentration with hemoglobin of over 18 suggesting dehydration, creatinine was 1.4, he had ketones in the urine.  Labs will be ordered here to see if he has true diabetic ketoacidosis, he will get IV fluids, likely be started on some insulin to correct and he will likely need to stay overnight for diabetic ketoacidosis.  Critical care provided with insulin drip  .Critical Care Performed by: Noemi Chapel, MD Authorized by: Noemi Chapel, MD   Critical care provider statement:    Critical care time (minutes):  45   Critical care was necessary to treat or prevent imminent or life-threatening deterioration of the following conditions:  Endocrine crisis   Critical care was time spent personally by me on the following activities:  Discussions with consultants, evaluation of patient's response to treatment, examination of patient, ordering and performing treatments and interventions, ordering and review of laboratory studies, ordering and review of radiographic studies, pulse oximetry, re-evaluation of patient's condition, obtaining history from patient or  surrogate and review of old charts      Noemi Chapel, MD 09/05/19 [phone]

## 2019-09-03 NOTE — Telephone Encounter (Signed)
Patient advised via phone. Potassium is likely less than 6.0 as his glucose is 545 and is likely in DKA. Patient advised to go to the ED for treatment.

## 2019-09-03 NOTE — Progress Notes (Signed)
Subjective:    Patient ID: Justin Baldwin, male    DOB: 06/25/1976, 43 y.o.   MRN: [id number]  HPI  Justin Baldwin is a 43 year old male with a history of atherosclerosis of the aorta, GERD, renal insufficiency, acute prostatitis who presents today with a chief complaint of urinary frequency.  He also reports polydipsia. Symptoms began about one month ago with urinary frequency during the day and also nocturia every 1-2 hours. During the night he will crave water and will drink one bottle of water each time.   He denies hematuria, acute back pain, loss of bladder control, polyphagia, numbness/tingling, difficulty urinating.   BP Readings from Last 3 Encounters:  09/03/19 130/86  11/30/18 118/73  10/31/18 114/79     Review of Systems  Constitutional: Negative for fever.  Gastrointestinal: Negative for abdominal pain.  Endocrine: Positive for polydipsia and polyuria. Negative for polyphagia.  Genitourinary: Positive for frequency. Negative for difficulty urinating, dysuria, hematuria and urgency.  Neurological: Negative for dizziness and numbness.       Past Medical History:  Diagnosis Date  . Erectile dysfunction   . Insomnia   . Skin abnormality    hidradenitis suppurativa      Social History   Socioeconomic History  . Marital status: Married    Spouse name: Not on file  . Number of children: Not on file  . Years of education: Not on file  . Highest education level: Not on file  Occupational History  . Not on file  Social Needs  . Financial resource strain: Not on file  . Food insecurity    Worry: Not on file    Inability: Not on file  . Transportation needs    Medical: Not on file    Non-medical: Not on file  Tobacco Use  . Smoking status: Current Every Day Smoker    Packs/day: 1.00    Types: Cigarettes  . Smokeless tobacco: Former Systems developer    Quit date: 02/14/2016  Substance and Sexual Activity  . Alcohol use: Yes    Alcohol/week: 0.0 standard drinks    Comment:  occasinal   . Drug use: No  . Sexual activity: Not on file  Lifestyle  . Physical activity    Days per week: Not on file    Minutes per session: Not on file  . Stress: Not on file  Relationships  . Social Herbalist on phone: Not on file    Gets together: Not on file    Attends religious service: Not on file    Active member of club or organization: Not on file    Attends meetings of clubs or organizations: Not on file    Relationship status: Not on file  . Intimate partner violence    Fear of current or ex partner: Not on file    Emotionally abused: Not on file    Physically abused: Not on file    Forced sexual activity: Not on file  Other Topics Concern  . Not on file  Social History Narrative   Married.   2 children.   Works as a Nutritional therapist   Enjoys spending time with family.     Past Surgical History:  Procedure Laterality Date  . IRRIGATION AND DEBRIDEMENT ABSCESS N/A 09/22/2018   Procedure: IRRIGATION AND DEBRIDEMENT ABSCESS;  Surgeon: Ralene Ok, MD;  Location: [facility];  Service: General;  Laterality: N/A;    Family History  Problem Relation Age of Onset  .  Heart attack Father   . Emphysema Paternal Grandmother   . Arthritis Mother     No Known Allergies  Current Outpatient Medications on File Prior to Visit  Medication Sig Dispense Refill  . atorvastatin (LIPITOR) 40 MG tablet Take 1 tablet (40 mg total) by mouth daily. For cholesterol. 90 tablet 3  . omeprazole (PRILOSEC) 20 MG capsule TAKE 1 CAPSULE(20 MG) BY MOUTH DAILY FOR HEARTBURN 90 capsule 0  . oxymetazoline (AFRIN) 0.05 % nasal spray Place 1-2 sprays into both nostrils as needed for congestion.      No current facility-administered medications on file prior to visit.     BP 130/86   Pulse 89   Temp 98 F (36.7 C) (Temporal)   Ht 6\' 1"  (1.854 m)   Wt 234 lb 8 oz (106.4 kg)   SpO2 98%   BMI 30.94 kg/m    Objective:   Physical Exam  Constitutional: He appears  well-nourished.  Neck: Neck supple.  Cardiovascular: Normal rate and regular rhythm.  Respiratory: Effort normal and breath sounds normal.  Skin: Skin is warm and dry.  Psychiatric: He has a normal mood and affect.           Assessment & Plan:

## 2019-09-03 NOTE — Assessment & Plan Note (Addendum)
Polyuria and polydipsia x one month. A1C today of 13.3 without prior history of diabetes. UA today with ketones, 3+ glucose.   Discussed new diagnosis of diabetes including glucose checks, diet, exercise, treatment.  Rx for metformin sent to pharmacy. Consider further treatment once other labs return. Rx for glucometer kit provided.   Checking labs today including urine microalbumin, lipids, renal function, glucose (he did eat prior to his visit today). He appears very stable today.  Managed on statin. Foot exam next visit. Consider adding ACE/ARB for renal protection. Pneumonia vaccination next visit.  Follow up in 3 months or sooner if glucose readings are at or greater than 200.  Update:  BMP returned with glucose reading of 545, potassium of 6.0, sodium of 124. Presumed DKA given presence of ketones and glucose in the urine. He was notified of results and sent to [facility] for treatment.

## 2019-09-03 NOTE — Telephone Encounter (Signed)
This has been address earlier today

## 2019-09-03 NOTE — ED Provider Notes (Signed)
Pt has elevated glucose, sodium is low at 125.  Pt has a positive troponin and nonspecific EKG changes.  I will discuss admission with hospitalist     Sidney Ace 09/03/19 1737    Noemi Chapel, MD 09/05/19 [phone]

## 2019-09-03 NOTE — ED Provider Notes (Signed)
[facility] EMERGENCY DEPARTMENT Provider Note   CSN: [id number] Arrival date & time: 09/03/19  1511     History   Chief Complaint Chief Complaint  Patient presents with  . Diabetic Ketoacidosis    HPI Justin Baldwin is a 43 y.o. male.     Patient is a 43 year old male who presents to the emergency department with a possible diabetic ketoacidosis.  The patient states that he has been having problems with increased urination and increased thirst for nearly a month.  He stated the problems seem to be getting worse and he went to his doctor's office today.  He had blood work done.  He was diagnosed with diabetes, he was given instructions on what to do, but he received a call after he got home that his blood work showed that he may be in diabetic ketoacidosis and he should go to the emergency department for evaluation and management.  The patient states that he has at least 10-12 episodes of going to the bathroom every night, and he has just about the same number during the day.  He is Thursday, and cannot seem to quench the thirst.  He has had some fatigue, he has not had any, or loss of consciousness of any kind.  He does not have any infections that he is aware of.  Is been no recent fever or chills that he is aware of.  He has not been around anyone with a known diagnosis of the COVID-19 virus.  He has not been traveling recently.  The history is provided by the patient.    Past Medical History:  Diagnosis Date  . Diabetes mellitus without complication ([place])   . Erectile dysfunction   . Insomnia   . Skin abnormality    hidradenitis suppurativa     Patient Active Problem List   Diagnosis Date Noted  . Type 2 diabetes mellitus without complication, without long-term current use of insulin ([place]) 09/03/2019  . Gastroesophageal reflux disease 03/06/2019  . Atherosclerosis of aorta ([place]) 11/30/2018  . Dizziness 11/30/2018  . Medication monitoring encounter 10/31/2018  . Atypical  mycobacterium infection   . Abscess of buttock, left 09/22/2018  . Hydradenitis 09/22/2018  . Kidney insufficiency 09/22/2018  . Lump 08/31/2018  . Deep tissue injury 08/25/2018  . Skin rash 07/25/2018  . Fatigue 11/08/2017  . Vertigo 07/11/2017  . Preventative health care 03/29/2016  . Insomnia 03/15/2016  . Erectile dysfunction 03/15/2016    Past Surgical History:  Procedure Laterality Date  . IRRIGATION AND DEBRIDEMENT ABSCESS N/A 09/22/2018   Procedure: IRRIGATION AND DEBRIDEMENT ABSCESS;  Surgeon: Ralene Ok, MD;  Location: [facility] ORS;  Service: General;  Laterality: N/A;        Home Medications    Prior to Admission medications   Medication Sig Start Date End Date Taking? Authorizing Provider  atorvastatin (LIPITOR) 40 MG tablet Take 1 tablet (40 mg total) by mouth daily. For cholesterol. 12/05/18   Pleas [name], NP  blood glucose meter kit and supplies KIT Dispense based on patient and insurance preference. Use up to four times daily as directed. (FOR ICD-9 250.00, 250.01). 09/03/19   Pleas [name], NP  metFORMIN (GLUCOPHAGE) 1000 MG tablet Take 1 tablet (1,000 mg total) by mouth 2 (two) times daily with a meal. For diabetes. 09/03/19   Pleas [name], NP  omeprazole (PRILOSEC) 20 MG capsule TAKE 1 CAPSULE(20 MG) BY MOUTH DAILY FOR HEARTBURN 07/06/19   Pleas [name], NP  oxymetazoline Melissa Montane)  0.05 % nasal spray Place 1-2 sprays into both nostrils as needed for congestion.     [provider]    Family History Family History  Problem Relation Age of Onset  . Heart attack Father   . Emphysema Paternal Grandmother   . Arthritis Mother     Social History Social History   Tobacco Use  . Smoking status: Current Every Day Smoker    Packs/day: 1.00    Types: Cigarettes  . Smokeless tobacco: Former Systems developer    Quit date: 02/14/2016  Substance Use Topics  . Alcohol use: Yes    Alcohol/week: 5.0 standard drinks    Types: 5 Shots of liquor per  week    Comment: occasinal   . Drug use: No     Allergies   Patient has no known allergies.   Review of Systems Review of Systems  Constitutional: Positive for fatigue. Negative for activity change and appetite change.  HENT: Negative for congestion, ear discharge, ear pain, facial swelling, nosebleeds, rhinorrhea, sneezing and tinnitus.   Eyes: Negative for photophobia, pain and discharge.  Respiratory: Negative for cough, choking, shortness of breath and wheezing.   Cardiovascular: Negative for chest pain, palpitations and leg swelling.  Gastrointestinal: Negative for abdominal pain, blood in stool, constipation, diarrhea, nausea and vomiting.  Endocrine: Positive for polydipsia and polyuria.  Genitourinary: Negative for difficulty urinating, dysuria, flank pain, frequency and hematuria.  Musculoskeletal: Negative for back pain, gait problem, myalgias and neck pain.  Skin: Negative for color change, rash and wound.  Neurological: Negative for dizziness, seizures, syncope, facial asymmetry, speech difficulty, weakness and numbness.  Hematological: Negative for adenopathy. Does not bruise/bleed easily.  Psychiatric/Behavioral: Negative for agitation, confusion, hallucinations, self-injury and suicidal ideas. The patient is not nervous/anxious.      Physical Exam Updated Vital Signs BP (!) 161/103 (BP Location: Right Arm)   Pulse 88   Temp 98.3 F (36.8 C) (Oral)   Resp 18   Ht 6' 1"  (1.854 m)   Wt 105.7 kg   SpO2 98%   BMI 30.74 kg/m   Physical Exam Vitals signs and nursing note reviewed.  Constitutional:      General: He is not in acute distress.    Appearance: He is well-developed.  HENT:     Head: Normocephalic and atraumatic.     Right Ear: External ear normal.     Left Ear: External ear normal.  Eyes:     General: No scleral icterus.       Right eye: No discharge.        Left eye: No discharge.     Conjunctiva/sclera: Conjunctivae normal.  Neck:      Musculoskeletal: Neck supple.     Trachea: No tracheal deviation.  Cardiovascular:     Rate and Rhythm: Normal rate and regular rhythm.  Pulmonary:     Effort: Pulmonary effort is normal. No respiratory distress.     Breath sounds: Normal breath sounds. No stridor. No wheezing or rales.  Abdominal:     General: Bowel sounds are normal. There is no distension.     Palpations: Abdomen is soft.     Tenderness: There is no abdominal tenderness. There is no guarding or rebound.  Musculoskeletal:        General: No tenderness or signs of injury.     Right lower leg: No edema.     Left lower leg: No edema.  Skin:    General: Skin is warm and dry.  Findings: No rash.  Neurological:     Mental Status: He is alert.     Cranial Nerves: No cranial nerve deficit (no facial droop, extraocular movements intact, no slurred speech).     Sensory: No sensory deficit.     Motor: No abnormal muscle tone or seizure activity.     Coordination: Coordination normal.      ED Treatments / Results  Labs (all labs ordered are listed, but only abnormal results are displayed) Labs Reviewed - No data to display  EKG None  Radiology No results found.  Procedures Procedures (including critical care time) CRITICAL CARE Performed by: Lily Kocher Total critical care time: *35** minutes Critical care time was exclusive of separately billable procedures and treating other patients. Critical care was necessary to treat or prevent imminent or life-threatening deterioration. Critical care was time spent personally by me on the following activities: development of treatment plan with patient and/or surrogate as well as nursing, discussions with consultants, evaluation of patient's response to treatment, examination of patient, obtaining history from patient or surrogate, ordering and performing treatments and interventions, ordering and review of laboratory studies, ordering and review of radiographic  studies, pulse oximetry and re-evaluation of patient's condition.  Medications Ordered in ED Medications - No data to display   Initial Impression / Assessment and Plan / ED Course  I have reviewed the triage vital signs and the nursing notes.  Pertinent labs & imaging results that were available during my care of the patient were reviewed by me and considered in my medical decision making (see chart for details).          Final Clinical Impressions(s) / ED Diagnoses MDM  Vital signs reviewed.  Blood pressure is elevated at 161/103, otherwise vital signs are within normal limits.  Pulse oximetry is 98% on room air.  Within normal limits by my interpretation.  The patient currently is not tachycardic or tachypneic.  According to his labs at the office his blood sugar was up to 545, and he had ketones in the urine.  Repeat labs are pending.  I have ordered the patient IV fluids while we are getting his other labs.  CBG -437, Labs pending.    Pt's care to be continued by K. Sofia, PA-C.   Final diagnoses:  None    ED Discharge Orders    None       Lily Kocher, Hershal Coria 09/03/19 2104    Noemi Chapel, MD 09/05/19 [phone]

## 2019-09-04 DIAGNOSIS — R739 Hyperglycemia, unspecified: Secondary | ICD-10-CM | POA: Diagnosis not present

## 2019-09-04 LAB — BASIC METABOLIC PANEL
Anion gap: 9 (ref 5–15)
BUN: 16 mg/dL (ref 6–20)
CO2: 23 mmol/L (ref 22–32)
Calcium: 7.8 mg/dL — ABNORMAL LOW (ref 8.9–10.3)
Chloride: 99 mmol/L (ref 98–111)
Creatinine, Ser: 0.97 mg/dL (ref 0.61–1.24)
GFR calc Af Amer: >60 mL/min (ref >60)
GFR calc non Af Amer: >60 mL/min (ref >60)
Glucose, Bld: 201 mg/dL — ABNORMAL HIGH (ref 70–99)
Potassium: 3.5 mmol/L (ref 3.5–5.1)
Sodium: 131 mmol/L — ABNORMAL LOW (ref 135–145)

## 2019-09-04 LAB — CBC
HCT: 49 % (ref 39.0–52.0)
Hemoglobin: 16.5 g/dL (ref 13.0–17.0)
MCH: 30.3 pg (ref 26.0–34.0)
MCHC: 33.7 g/dL (ref 30.0–36.0)
MCV: 90.1 fL (ref 80.0–100.0)
Platelets: 221 10*3/uL (ref 150–400)
RBC: 5.44 MIL/uL (ref 4.22–5.81)
RDW: 13 % (ref 11.5–15.5)
WBC: 10 10*3/uL (ref 4.0–10.5)
nRBC: 0 % (ref 0.0–0.2)

## 2019-09-04 LAB — CBG MONITORING, ED
Glucose-Capillary: 207 mg/dL — ABNORMAL HIGH (ref 70–99)
Glucose-Capillary: 195 mg/dL — ABNORMAL HIGH (ref 70–99)

## 2019-09-04 LAB — SARS CORONAVIRUS 2 (TAT 6-24 HRS): SARS Coronavirus 2: NEGATIVE

## 2019-09-04 LAB — GLUCOSE, CAPILLARY
Glucose-Capillary: 215 mg/dL — ABNORMAL HIGH (ref 70–99)
Glucose-Capillary: 286 mg/dL — ABNORMAL HIGH (ref 70–99)

## 2019-09-04 MED ORDER — LIVING WELL WITH DIABETES BOOK
Freq: Once | Status: AC
  Administered 2019-09-04: 10:00:00
  Filled 2019-09-04: qty 1

## 2019-09-04 MED ORDER — INSULIN GLARGINE 100 UNIT/ML ~~LOC~~ SOLN: 10.0000 [IU] | Freq: Every day | SUBCUTANEOUS | 11 refills | Status: DC

## 2019-09-04 MED ORDER — INSULIN STARTER KIT- PEN NEEDLES (ENGLISH)
1.0000 | Freq: Once | Status: DC
  Filled 2019-09-04: qty 1

## 2019-09-04 MED ORDER — INSULIN STARTER KIT- PEN NEEDLES (ENGLISH): 1.0000 | Freq: Once | 0 refills | Status: AC

## 2019-09-04 NOTE — Progress Notes (Signed)
Nutrition Education   RD consulted for nutrition education regarding diabetes.   Patient is a a pleasant 43 yo male who is newly diagnosed with diabetes. He has been reviewing the Living Well with Diabetes book and is receptive to receiving education.   Lab Results  Component Value Date   HGBA1C 13.3 (A) 09/03/2019    RD provided "Carbohydrate Counting for People with Diabetes" handout from the Academy of Nutrition and Dietetics. Discussed different food groups and their effects on blood sugar, emphasizing carbohydrate-containing foods.   Provided list of carbohydrates and recommended serving sizes of common foods.  Discussed importance of controlled and consistent carbohydrate intake throughout the day. Provided examples of ways to balance meals/snacks and encouraged intake of high-fiber, whole grain complex carbohydrates. Teach back method used.  Expect good compliance. Patient is very engaged during education today. He works out at [employer] 6 days per week and has tried various types of diets including low cho. He is proficient at reading labels and motivated to make diet/lifestyle adjustments in order to manage his blood glucose.  Body mass index is 32.92 kg/m. Pt meets criteria for obese based on current BMI.  Current diet order is CHO Modified, patient is consuming approximately 75% of meals at this time.  Labs and medications reviewed. SSI, Lantus. RN is providing insulin starter kit and reviewing how to use.   CBG (last 3)  Recent Labs    09/04/19 0448 09/04/19 0929 09/04/19 1115  GLUCAP 195* 215* 286*   BMP Latest Ref Rng & Units 09/04/2019 09/03/2019 09/03/2019  Glucose 70 - 99 mg/dL 201(H) 406(H) 545(HH)  BUN 6 - 20 mg/dL 16 17 14   Creatinine 0.61 - 1.24 mg/dL 0.97 1.52(H) 1.41  BUN/Creat Ratio 6 - 22 (calc) - - -  Sodium 135 - 145 mmol/L 131(L) 125(L) 124(L)  Potassium 3.5 - 5.1 mmol/L 3.5 4.2 6.0(H)  Chloride 98 - 111 mmol/L 99 88(L) 87(L)  CO2 22 - 32 mmol/L 23  26 31   Calcium 8.9 - 10.3 mg/dL 7.8(L) 8.7(L) 9.5      No further nutrition interventions warranted at this time. RD contact information provided. Patient is in process of being discharged home.    Colman Cater MS,RD,CSG,LDN Office: [phone] Pager: [phone]

## 2019-09-04 NOTE — Progress Notes (Signed)
Inpatient Diabetes Program Recommendations  AACE/ADA: New Consensus Statement on Inpatient Glycemic Control (2015)  Target Ranges:  Prepandial:   less than 140 mg/dL      Peak postprandial:   less than 180 mg/dL (1-2 hours)      Critically ill patients:  140 - 180 mg/dL   Lab Results  Component Value Date   GLUCAP 286 (H) 09/04/2019   HGBA1C 13.3 (A) 09/03/2019    Review of Glycemic Control Results for Justin Baldwin, Justin Baldwin (MRN [id number]) as of 09/04/2019 13:09  Ref. Range 09/03/2019 21:32 09/04/2019 01:04 09/04/2019 04:48 09/04/2019 09:29 09/04/2019 11:15  Glucose-Capillary Latest Ref Range: 70 - 99 mg/dL 230 (H) 207 (H) 195 (H) 215 (H) 286 (H)   Diabetes history: New diagnosis DM Outpatient Diabetes medications: Metformin started in Dr's ofice  Inpatient Diabetes Program Recommendations:    Patient d/c'd home from ER.  Called patient to discuss insulin use and new diagnosis of DM.  Sent patient video link of insulin pen as well for review.  He states that he has given injection in the past for Humira and thinks this is similar.  We also reviewed by phone results of A1C, goal blood sugars, hypoglycemia/signs/symptoms, treatment of hypoglycemia, and the difference between type 1 and type 2 diabetes.  Encouraged patient to follow-up with PCP as well to discuss DM and blood sugars.  We discussed Lantus as basal insulin that affects blood sugars fasting.  Encouraged monitoring tid before meals and HS.  Patient verbalized understanding and seemed very engaged.  Put in order for outpatient DM education as well (co-sign required).  Reviewed DM survival skills.   Thanks Adah Perl, RN, BC-ADM Inpatient Diabetes Coordinator Pager [phone] (8a-5p)

## 2019-09-04 NOTE — ED Notes (Signed)
Pt. Sleeping with equal rise and chest fall. Will check CBG once patient is awake.

## 2019-09-04 NOTE — Progress Notes (Signed)
Inpatient Diabetes Program Recommendations  AACE/ADA: New Consensus Statement on Inpatient Glycemic Control   Target Ranges:  Prepandial:   less than 140 mg/dL      Peak postprandial:   less than 180 mg/dL (1-2 hours)      Critically ill patients:  140 - 180 mg/dL   Results for JULIOCESAR, NAUTA (MRN [id number]) as of 09/04/2019 07:40  Ref. Range 09/03/2019 15:27 09/03/2019 17:21 09/03/2019 19:19 09/03/2019 21:32 09/04/2019 01:04 09/04/2019 04:48  Glucose-Capillary Latest Ref Range: 70 - 99 mg/dL 437 (H) 318 (H)  Novolog 7 units 246 (H)   230 (H)  Novolog 3 units 207 (H)  Novolog 3 units 195 (H)  Novolog 2 units  Results for MART, MARECKI (MRN [id number]) as of 09/04/2019 07:40  Ref. Range 09/03/2019 09:00 09/03/2019 09:27  Glucose Latest Ref Range: 70 - 99 mg/dL  545 (HH)  Hemoglobin A1C Latest Ref Range: 4.0 - 5.6 % 13.3 (A)    Review of Glycemic Control  Diabetes history: New dx DM (at PCP on 09/03/19) Outpatient Diabetes medications: Metformin 1000 mg BID (just prescribed 11/9//20 but not started yet) Current orders for Inpatient glycemic control: Lantus 10 units QHS, Novolog 0-9 units Q4H  Inpatient Diabetes Program Recommendations:   Insulin-Basal: Noted Lantus 10 units QHS ordered to start on 09/03/19. However, in reviewing chart noted Lantus was NOT GIVEN last night (charted as medication not available).    NOTE: Noted consult for Diabetes Coordinator for new DM dx. Per H&P, patient went to PCP on 09/03/19 with symptoms of hyperglycemia and was newly dx with DM. Once labs by PCP resulted patient was asked to go to Emergency Department for possible DKA. Patient admitted with hyperglycemia and has been given Novolog correction Q4H. Patient was ordered Lantus 10 units QHS but did NOT receive Lantus last night (charted as medication not available. Ordered Living Well with DM book, RD consult for diet education, and patient education by RNs. Diabetes Coordinator will plan to talk with patient  today regarding new DM dx.  Thanks, Barnie Alderman, RN, MSN, CDE Diabetes Coordinator Inpatient Diabetes Program [phone] (Team Pager from 8am to 5pm)

## 2019-09-04 NOTE — Discharge Summary (Signed)
Physician Discharge Summary  ASTOR GENTLE MMH:[id number] DOB: 08/10/1976 DOA: 09/03/2019  PCP: Pleas [name], NP  Admit date: 09/03/2019  Discharge date: 09/04/2019  Admitted From:Home  Disposition:  Home  Recommendations for Outpatient Follow-up:  1. Follow up with PCP in 1-2 weeks 2. Continue to monitor blood glucose carefully at home 3. Follow low-carb diet and patient has been given dietitian education prior to discharge 4. Continue on Metformin as prescribed along with now Lantus 10 units daily at nighttime  Home Health: None  Equipment/Devices: None  Discharge Condition: Stable  CODE STATUS: Full  Diet recommendation: Heart Healthy/carb modified  Brief/Interim Summary: Per HPI: Justin Baldwin is a 43 y.o. male with medical history significant for hidradenitis, atypical mycobacterial infection, now newly diagnosed diabetic.  Patient saw his primary care provider today, with complaints of polyuria and polydipsia for 1 month.  HgbA1c checked and was 13.3.  UA showed ketones and glucose.  BMP subsequently ordered showed glucose of 545.  Patient was sent to the ED. Denies family history of diabetes.  Drinks 1 alcoholic drink on workdays- ~5 days per week.  1 bottle of beer or 1 mixed drinks.  Denies history of alcohol withdrawal.  No Covid positive contacts.  Patient was admitted with symptomatic hyperglycemia in the setting of new onset type 2 diabetes.  He is hemoglobin A1c is noted to be 13.3%.  He was just yesterday started on Metformin by his PCP, but had not had a chance to take his medication yet.  His blood glucose readings have improved considerably and he is stable for discharge at this point in time.  He has been educated by dietitian and understands that he needs to watch his diet and monitor his blood glucose levels carefully at home and continue take his medications as prescribed.  No further symptomatology currently noted.  Discharge Diagnoses:  Active Problems:  Hyperglycemia  Principal discharge diagnosis: Symptomatic hyperglycemia newly diagnosed type 2 diabetes.  Discharge Instructions  Discharge Instructions    Diet - low sodium heart healthy   Complete by: As directed    Increase activity slowly   Complete by: As directed      Allergies as of 09/04/2019   No Known Allergies     Medication List    TAKE these medications   atorvastatin 40 MG tablet Commonly known as: LIPITOR Take 1 tablet (40 mg total) by mouth daily. For cholesterol.   blood glucose meter kit and supplies Kit Dispense based on patient and insurance preference. Use up to four times daily as directed. (FOR ICD-9 250.00, 250.01).   insulin glargine 100 UNIT/ML injection Commonly known as: LANTUS Inject 0.1 mLs (10 Units total) into the skin at bedtime.   insulin starter kit- pen needles Misc 1 kit by Other route once for 1 dose.   metFORMIN 1000 MG tablet Commonly known as: GLUCOPHAGE Take 1 tablet (1,000 mg total) by mouth 2 (two) times daily with a meal. For diabetes.   omeprazole 20 MG capsule Commonly known as: PRILOSEC TAKE 1 CAPSULE(20 MG) BY MOUTH DAILY FOR HEARTBURN What changed: See the new instructions.   oxymetazoline 0.05 % nasal spray Commonly known as: AFRIN Place 1-2 sprays into both nostrils as needed for congestion.   tadalafil 5 MG tablet Commonly known as: CIALIS Take by mouth.      Follow-up Information    Pleas [name], NP Follow up in 1 week(s).   Specialty: Internal Medicine Contact information: [place]  [place] [address] [phone]          No Known Allergies  Consultations:  None   Procedures/Studies: Dg Chest Portable 1 View  Result Date: 09/03/2019 CLINICAL DATA:  New onset diabetes EXAM: PORTABLE CHEST 1 VIEW COMPARISON:  11/01/2005 FINDINGS: No focal airspace disease or effusion. Normal heart size. Accentuated vascular opacities. No edema. No pneumothorax. IMPRESSION: No active  disease. Electronically Signed   By: Donavan Foil M.D.   On: 09/03/2019 16:41     Discharge Exam: Vitals:   09/04/19 0646 09/04/19 0921  BP: 131/80 137/84  Pulse: 84 72  Resp: 17 16  Temp:  98.3 F (36.8 C)  SpO2: 98% 98%   Vitals:   09/03/19 1817 09/03/19 1818 09/04/19 0646 09/04/19 0921  BP: (!) 158/93  131/80 137/84  Pulse:   84 72  Resp: _0 Temp:    98.3 F (36.8 C)  TempSrc:    Oral  SpO2:   98% 98%  Weight:    110.1 kg  Height:    6' (1.829 m)    General: Pt is alert, awake, not in acute distress Cardiovascular: RRR, S1/S2 +, no rubs, no gallops Respiratory: CTA bilaterally, no wheezing, no rhonchi Abdominal: Soft, NT, ND, bowel sounds + Extremities: no edema, no cyanosis    The results of significant diagnostics from this hospitalization (including imaging, microbiology, ancillary and laboratory) are listed below for reference.     Microbiology: Recent Results (from the past 240 hour(s))  SARS CORONAVIRUS 2 (TAT 6-24 HRS) Nasopharyngeal Nasopharyngeal Swab     Status: None   Collection Time: 09/03/19  6:16 PM   Specimen: Nasopharyngeal Swab  Result Value Ref Range Status   SARS Coronavirus 2 NEGATIVE NEGATIVE Final    Comment: (NOTE) SARS-CoV-2 target nucleic acids are NOT DETECTED. The SARS-CoV-2 RNA is generally detectable in upper and lower respiratory specimens during the acute phase of infection. Negative results do not preclude SARS-CoV-2 infection, do not rule out co-infections with other pathogens, and should not be used as the sole basis for treatment or other patient management decisions. Negative results must be combined with clinical observations, patient history, and epidemiological information. The expected result is Negative. Fact Sheet for Patients: SugarRoll.be Fact Sheet for Healthcare Providers: https://www.woods-mathews.com/ This test is not yet approved or cleared by the [place] FDA and  has been authorized for detection and/or diagnosis of SARS-CoV-2 by FDA under an Emergency Use Authorization (EUA). This EUA will remain  in effect (meaning this test can be used) for the duration of the COVID-19 declaration under Section 56 4(b)(1) of the Act, 21 U.S.C. section 360bbb-3(b)(1), unless the authorization is terminated or revoked sooner. Performed at [facility], [place] [address], [place], [place] [address]      Labs: BNP (last 3 results) No results for input(s): BNP in the last 8760 hours. Basic Metabolic Panel: Recent Labs  Lab 09/03/19 0927 09/03/19 1625 09/04/19 0435  NA 124* 125* 131*  K 6.0* 4.2 3.5  CL 87* 88* 99  CO2 _1 GLUCOSE 545* 406* 201*  BUN _2 CREATININE 1.41 1.52* 0.97  CALCIUM 9.5 8.7* 7.8*   Liver Function Tests: Recent Labs  Lab 09/03/19 1625  AST 36  ALT 57*  ALKPHOS 41  BILITOT 1.6*  PROT 7.7  ALBUMIN 3.9   No results for input(s): LIPASE, AMYLASE in the last 168 hours. No results for input(s): AMMONIA in the last  168 hours. CBC: Recent Labs  Lab 09/03/19 0927 09/03/19 1625 09/04/19 0435  WBC 7.9 9.4 10.0  NEUTROABS 4.8 5.8  --   HGB 18.2 Repeated and verified X2.* 17.4* 16.5  HCT 54.4 Repeated and verified X2.* 51.9 49.0  MCV 91.6 90.4 90.1  PLT 227.0 253 221   Cardiac Enzymes: No results for input(s): CKTOTAL, CKMB, CKMBINDEX, TROPONINI in the last 168 hours. BNP: Invalid input(s): POCBNP CBG: Recent Labs  Lab 09/03/19 1919 09/03/19 2132 09/04/19 0104 09/04/19 0448 09/04/19 0929  GLUCAP 246* 230* 207* 195* 215*   D-Dimer No results for input(s): DDIMER in the last 72 hours. Hgb A1c Recent Labs    09/03/19 0900  HGBA1C 13.3*   Lipid Profile Recent Labs    09/03/19 0927  CHOL 350*  HDL 18.70*  TRIG 471.0 Triglyceride is over 400; calculations on Lipids are invalid.*  CHOLHDL 19  LDLDIRECT 286.0   Thyroid function studies No results for input(s): TSH, T4TOTAL,  T3FREE, THYROIDAB in the last 72 hours.  Invalid input(s): FREET3 Anemia work up No results for input(s): VITAMINB12, FOLATE, FERRITIN, TIBC, IRON, RETICCTPCT in the last 72 hours. Urinalysis    Component Value Date/Time   COLORURINE YELLOW 09/03/2019 1542   APPEARANCEUR CLEAR 09/03/2019 1542   LABSPEC 1.032 (H) 09/03/2019 1542   PHURINE 6.0 09/03/2019 1542   GLUCOSEU >=500 (A) 09/03/2019 1542   HGBUR MODERATE (A) 09/03/2019 1542   BILIRUBINUR NEGATIVE 09/03/2019 1542   BILIRUBINUR Negative 09/03/2019 0910   KETONESUR 5 (A) 09/03/2019 1542   PROTEINUR NEGATIVE 09/03/2019 1542   UROBILINOGEN 0.2 09/03/2019 0910   NITRITE NEGATIVE 09/03/2019 1542   LEUKOCYTESUR NEGATIVE 09/03/2019 1542   Sepsis Labs Invalid input(s): PROCALCITONIN,  WBC,  LACTICIDVEN Microbiology Recent Results (from the past 240 hour(s))  SARS CORONAVIRUS 2 (TAT 6-24 HRS) Nasopharyngeal Nasopharyngeal Swab     Status: None   Collection Time: 09/03/19  6:16 PM   Specimen: Nasopharyngeal Swab  Result Value Ref Range Status   SARS Coronavirus 2 NEGATIVE NEGATIVE Final    Comment: (NOTE) SARS-CoV-2 target nucleic acids are NOT DETECTED. The SARS-CoV-2 RNA is generally detectable in upper and lower respiratory specimens during the acute phase of infection. Negative results do not preclude SARS-CoV-2 infection, do not rule out co-infections with other pathogens, and should not be used as the sole basis for treatment or other patient management decisions. Negative results must be combined with clinical observations, patient history, and epidemiological information. The expected result is Negative. Fact Sheet for Patients: SugarRoll.be Fact Sheet for Healthcare Providers: https://www.woods-mathews.com/ This test is not yet approved or cleared by the [place] FDA and  has been authorized for detection and/or diagnosis of SARS-CoV-2 by FDA under an Emergency Use  Authorization (EUA). This EUA will remain  in effect (meaning this test can be used) for the duration of the COVID-19 declaration under Section 56 4(b)(1) of the Act, 21 U.S.C. section 360bbb-3(b)(1), unless the authorization is terminated or revoked sooner. Performed at [facility], [place] [address] [place], [place] [address]      Time coordinating discharge: 35 minutes  SIGNED:   Rodena Goldmann, DO [facility] 09/04/2019, 10:56 AM  If 7PM-7AM, please contact night-coverage [email or web address]

## 2019-09-05 ENCOUNTER — Telehealth: Payer: Self-pay

## 2019-09-05 NOTE — Telephone Encounter (Signed)
Patient has ER follow up appointment on 09/10/2019. Patient states he was told at the hospital to have [name] set him up with endocrinologist. I advised patient I would let Anda Kraft know but not sure if we have to wait till he comes in for follow up before referring.

## 2019-09-05 NOTE — Telephone Encounter (Signed)
Please notify patient that I am happy to manage his diabetes but if he would like to see an endocrinologist then that's fine. We can discuss on 09/10/19 as well.

## 2019-09-05 NOTE — Telephone Encounter (Signed)
Spoken and notified patient of Kate Clark's comments. Patient verbalized understanding.  

## 2019-09-06 DIAGNOSIS — E119 Type 2 diabetes mellitus without complications: Secondary | ICD-10-CM

## 2019-09-06 DIAGNOSIS — E875 Hyperkalemia: Secondary | ICD-10-CM

## 2019-09-10 ENCOUNTER — Ambulatory Visit (INDEPENDENT_AMBULATORY_CARE_PROVIDER_SITE_OTHER): Payer: BC Managed Care – PPO | Admitting: Primary Care

## 2019-09-10 ENCOUNTER — Other Ambulatory Visit: Payer: Self-pay

## 2019-09-10 ENCOUNTER — Encounter: Payer: Self-pay | Admitting: Primary Care

## 2019-09-10 DIAGNOSIS — E119 Type 2 diabetes mellitus without complications: Secondary | ICD-10-CM | POA: Diagnosis not present

## 2019-09-10 LAB — BASIC METABOLIC PANEL
BUN: 20 mg/dL (ref 6–23)
CO2: 29 mEq/L (ref 19–32)
Calcium: 9.7 mg/dL (ref 8.4–10.5)
Chloride: 96 mEq/L (ref 96–112)
Creatinine, Ser: 1.21 mg/dL (ref 0.40–1.50)
GFR: 65.39 mL/min (ref >60.00)
Glucose, Bld: 248 mg/dL — ABNORMAL HIGH (ref 70–99)
Potassium: 6 mEq/L — ABNORMAL HIGH (ref 3.5–5.1)
Sodium: 131 mEq/L — ABNORMAL LOW (ref 135–145)

## 2019-09-10 MED ORDER — "INSULIN SYRINGE 31G X 5/16"" 1 ML MISC": 0 refills | Status: DC

## 2019-09-10 MED ORDER — INSULIN GLARGINE 100 UNIT/ML ~~LOC~~ SOLN: 15.0000 [IU] | Freq: Every day | SUBCUTANEOUS | 11 refills | Status: DC

## 2019-09-10 NOTE — Assessment & Plan Note (Addendum)
Compliant to Lantus 10 units which was initiated during hospital stay. Increase Lantus to 15 units given recent readings. Discussed to continue to monitor glucose readings and notify if he continues to see readings at or above 150 on a consistent basis. He will update.   Continue Metformin. He will schedule an eye exam. Repeat BMP pending. Managed on statin. Foot exam next visit.  Follow up in 3 months. Hospital labs, notes reviewed.

## 2019-09-10 NOTE — Patient Instructions (Signed)
We've increased your dose of Lantus to 15 units nightly.   Continue checking your blood sugar levels.  Appropriate times to check your blood sugar levels are:  -Before any meal (breakfast, lunch, dinner) -Two hours after any meal (breakfast, lunch, dinner) -Bedtime  Record your readings and notify me if you continue to consistently run at or above 150 after a few weeks on Lantus 15 units.  Please schedule a follow up appointment in 3 months for diabetes check.  It was a pleasure to see you today!   Diabetes Mellitus and Nutrition, Adult When you have diabetes (diabetes mellitus), it is very important to have healthy eating habits because your blood sugar (glucose) levels are greatly affected by what you eat and drink. Eating healthy foods in the appropriate amounts, at about the same times every day, can help you:  Control your blood glucose.  Lower your risk of heart disease.  Improve your blood pressure.  Reach or maintain a healthy weight. Every person with diabetes is different, and each person has different needs for a meal plan. Your health care provider may recommend that you work with a diet and nutrition specialist (dietitian) to make a meal plan that is best for you. Your meal plan may vary depending on factors such as:  The calories you need.  The medicines you take.  Your weight.  Your blood glucose, blood pressure, and cholesterol levels.  Your activity level.  Other health conditions you have, such as heart or kidney disease. How do carbohydrates affect me? Carbohydrates, also called carbs, affect your blood glucose level more than any other type of food. Eating carbs naturally raises the amount of glucose in your blood. Carb counting is a method for keeping track of how many carbs you eat. Counting carbs is important to keep your blood glucose at a healthy level, especially if you use insulin or take certain oral diabetes medicines. It is important to know how  many carbs you can safely have in each meal. This is different for every person. Your dietitian can help you calculate how many carbs you should have at each meal and for each snack. Foods that contain carbs include:  Bread, cereal, rice, pasta, and crackers.  Potatoes and corn.  Peas, beans, and lentils.  Milk and yogurt.  Fruit and juice.  Desserts, such as cakes, cookies, ice cream, and candy. How does alcohol affect me? Alcohol can cause a sudden decrease in blood glucose (hypoglycemia), especially if you use insulin or take certain oral diabetes medicines. Hypoglycemia can be a life-threatening condition. Symptoms of hypoglycemia (sleepiness, dizziness, and confusion) are similar to symptoms of having too much alcohol. If your health care provider says that alcohol is safe for you, follow these guidelines:  Limit alcohol intake to no more than 1 drink per day for nonpregnant women and 2 drinks per day for men. One drink equals 12 oz of beer, 5 oz of wine, or 1 oz of hard liquor.  Do not drink on an empty stomach.  Keep yourself hydrated with water, diet soda, or unsweetened iced tea.  Keep in mind that regular soda, juice, and other mixers may contain a lot of sugar and must be counted as carbs. What are tips for following this plan?  Reading food labels  Start by checking the serving size on the "Nutrition Facts" label of packaged foods and drinks. The amount of calories, carbs, fats, and other nutrients listed on the label is based on one serving  of the item. Many items contain more than one serving per package.  Check the total grams (g) of carbs in one serving. You can calculate the number of servings of carbs in one serving by dividing the total carbs by 15. For example, if a food has 30 g of total carbs, it would be equal to 2 servings of carbs.  Check the number of grams (g) of saturated and trans fats in one serving. Choose foods that have low or no amount of these  fats.  Check the number of milligrams (mg) of salt (sodium) in one serving. Most people should limit total sodium intake to less than 2,300 mg per day.  Always check the nutrition information of foods labeled as "low-fat" or "nonfat". These foods may be higher in added sugar or refined carbs and should be avoided.  Talk to your dietitian to identify your daily goals for nutrients listed on the label. Shopping  Avoid buying canned, premade, or processed foods. These foods tend to be high in fat, sodium, and added sugar.  Shop around the outside edge of the grocery store. This includes fresh fruits and vegetables, bulk grains, fresh meats, and fresh dairy. Cooking  Use low-heat cooking methods, such as baking, instead of high-heat cooking methods like deep frying.  Cook using healthy oils, such as olive, canola, or sunflower oil.  Avoid cooking with butter, cream, or high-fat meats. Meal planning  Eat meals and snacks regularly, preferably at the same times every day. Avoid going long periods of time without eating.  Eat foods high in fiber, such as fresh fruits, vegetables, beans, and whole grains. Talk to your dietitian about how many servings of carbs you can eat at each meal.  Eat 4-6 ounces (oz) of lean protein each day, such as lean meat, chicken, fish, eggs, or tofu. One oz of lean protein is equal to: ? 1 oz of meat, chicken, or fish. ? 1 egg. ?  cup of tofu.  Eat some foods each day that contain healthy fats, such as avocado, nuts, seeds, and fish. Lifestyle  Check your blood glucose regularly.  Exercise regularly as told by your health care provider. This may include: ? 150 minutes of moderate-intensity or vigorous-intensity exercise each week. This could be brisk walking, biking, or water aerobics. ? Stretching and doing strength exercises, such as yoga or weightlifting, at least 2 times a week.  Take medicines as told by your health care provider.  Do not use any  products that contain nicotine or tobacco, such as cigarettes and e-cigarettes. If you need help quitting, ask your health care provider.  Work with a Social worker or diabetes educator to identify strategies to manage stress and any emotional and social challenges. Questions to ask a health care provider  Do I need to meet with a diabetes educator?  Do I need to meet with a dietitian?  What number can I call if I have questions?  When are the best times to check my blood glucose? Where to find more information:  American Diabetes Association: diabetes.org  Academy of Nutrition and Dietetics: www.eatright.[employer] of Diabetes and Digestive and Kidney Diseases (NIH): [employer] Summary  A healthy meal plan will help you control your blood glucose and maintain a healthy lifestyle.  Working with a diet and nutrition specialist (dietitian) can help you make a meal plan that is best for you.  Keep in mind that carbohydrates (carbs) and alcohol have immediate effects on your blood glucose  levels. It is important to count carbs and to use alcohol carefully. This information is not intended to replace advice given to you by your health care provider. Make sure you discuss any questions you have with your health care provider. Document Released: 07/08/2005 Document Revised: 09/23/2017 Document Reviewed: 11/15/2016 Elsevier Patient Education  2020 [employer].

## 2019-09-10 NOTE — Progress Notes (Signed)
Subjective:    Patient ID: Justin Baldwin, male    DOB: 01-27-1976, 43 y.o.   MRN: [id number]  HPI  Mr. Nedeau is a 43 year old male with a history of newly diagnosed type 2 diabetes, hyperlipidemia, aortic atherosclerosis who presents today for hospital follow up.  He presented to our office on 09/03/2019 for complaints of polyuria and polydipsia, A1C was 13 which was a new diagnosis. He was initiated on Metformin 1000 mg BID. Further labs returned later that day which revealed significant electrolyte imbalance so he was sent to the emergency department for quick treatment.   Admitted to [facility] for treatment, treated with IV fluids and IV insulin. Labs completed which including Troponin which was elevated, ECG with abnormalities. Troponin levels decreased as well as glucose levels. He was discharged home the next day with a prescription for Lantus 10 units, metformin was resumed.   Since his discharge home he's checking his glucose readings several times daily and is getting readings of:  High 100's to high 200's overall. Some mid 300's. He continues to inject Lantus 10 units, is needing a refill on syringes. Compliant to Metformin. His polyuria, polydipsia, and myalgias have reduced. He has noticed some mild blurred vision, does have an eye doctor and plans on scheduling an appointment. He is working on a [employer], has not had any alcohol.  Review of Systems  Respiratory: Negative for shortness of breath.   Cardiovascular: Negative for chest pain.  Endocrine: Negative for polydipsia and polyuria.  Musculoskeletal: Positive for myalgias.  Neurological: Negative for numbness.       Past Medical History:  Diagnosis Date  . Diabetes mellitus without complication ([place])   . Erectile dysfunction   . Insomnia   . Skin abnormality    hidradenitis suppurativa      Social History   Socioeconomic History  . Marital status: Married    Spouse name: Not on file  . Number of  children: Not on file  . Years of education: Not on file  . Highest education level: Not on file  Occupational History  . Not on file  Social Needs  . Financial resource strain: Not on file  . Food insecurity    Worry: Not on file    Inability: Not on file  . Transportation needs    Medical: Not on file    Non-medical: Not on file  Tobacco Use  . Smoking status: Current Every Day Smoker    Packs/day: 1.00    Types: Cigarettes  . Smokeless tobacco: Former Systems developer    Quit date: 02/14/2016  Substance and Sexual Activity  . Alcohol use: Not Currently    Alcohol/week: 5.0 standard drinks    Types: 5 Shots of liquor per week    Comment: occasinal   . Drug use: No  . Sexual activity: Not on file  Lifestyle  . Physical activity    Days per week: Not on file    Minutes per session: Not on file  . Stress: Not on file  Relationships  . Social Herbalist on phone: Not on file    Gets together: Not on file    Attends religious service: Not on file    Active member of club or organization: Not on file    Attends meetings of clubs or organizations: Not on file    Relationship status: Not on file  . Intimate partner violence    Fear of current or  ex partner: Not on file    Emotionally abused: Not on file    Physically abused: Not on file    Forced sexual activity: Not on file  Other Topics Concern  . Not on file  Social History Narrative   Married.   2 children.   Works as a Nutritional therapist   Enjoys spending time with family.     Past Surgical History:  Procedure Laterality Date  . IRRIGATION AND DEBRIDEMENT ABSCESS N/A 09/22/2018   Procedure: IRRIGATION AND DEBRIDEMENT ABSCESS;  Surgeon: Ralene Ok, MD;  Location: [facility] ORS;  Service: General;  Laterality: N/A;    Family History  Problem Relation Age of Onset  . Heart attack Father   . Emphysema Paternal Grandmother   . Arthritis Mother     No Known Allergies  Current Outpatient Medications on File Prior to  Visit  Medication Sig Dispense Refill  . atorvastatin (LIPITOR) 40 MG tablet Take 1 tablet (40 mg total) by mouth daily. For cholesterol. 90 tablet 3  . blood glucose meter kit and supplies KIT Dispense based on patient and insurance preference. Use up to four times daily as directed. (FOR ICD-9 250.00, 250.01). 1 each 0  . insulin glargine (LANTUS) 100 UNIT/ML injection Inject 0.1 mLs (10 Units total) into the skin at bedtime. 10 mL 11  . metFORMIN (GLUCOPHAGE) 1000 MG tablet Take 1 tablet (1,000 mg total) by mouth 2 (two) times daily with a meal. For diabetes. 180 tablet 1  . omeprazole (PRILOSEC) 20 MG capsule TAKE 1 CAPSULE(20 MG) BY MOUTH DAILY FOR HEARTBURN (Patient taking differently: Take 20 mg by mouth daily. ) 90 capsule 0  . oxymetazoline (AFRIN) 0.05 % nasal spray Place 1-2 sprays into both nostrils as needed for congestion.     . tadalafil (CIALIS) 5 MG tablet Take by mouth.     No current facility-administered medications on file prior to visit.     BP 128/80   Pulse 76   Temp 99.3 F (37.4 C) (Skin)   Ht _0  (1.854 m)   Wt 233 lb 8 oz (105.9 kg)   BMI 30.81 kg/m    Objective:   Physical Exam  Constitutional: He appears well-nourished.  Neck: Neck supple.  Cardiovascular: Normal rate and regular rhythm.  Respiratory: Effort normal and breath sounds normal.  Skin: Skin is warm and dry.  Psychiatric: He has a normal mood and affect.           Assessment & Plan:

## 2019-09-12 DIAGNOSIS — E875 Hyperkalemia: Secondary | ICD-10-CM | POA: Diagnosis not present

## 2019-09-13 LAB — BASIC METABOLIC PANEL
BUN/Creatinine Ratio: 13 (ref 9–20)
BUN: 18 mg/dL (ref 6–24)
CO2: 24 mmol/L (ref 20–29)
Calcium: 10.1 mg/dL (ref 8.7–10.2)
Chloride: 97 mmol/L (ref 96–106)
Creatinine, Ser: 1.38 mg/dL — ABNORMAL HIGH (ref 0.76–1.27)
GFR calc Af Amer: 72 mL/min/{1.73_m2} (ref >59)
GFR calc non Af Amer: 62 mL/min/{1.73_m2} (ref >59)
Glucose: 209 mg/dL — ABNORMAL HIGH (ref 65–99)
Potassium: 5.1 mmol/L (ref 3.5–5.2)
Sodium: 135 mmol/L (ref 134–144)

## 2019-09-23 MED ORDER — GLUCOSE BLOOD VI STRP: ORAL_STRIP | 3 refills | Status: DC

## 2019-10-06 DIAGNOSIS — K219 Gastro-esophageal reflux disease without esophagitis: Secondary | ICD-10-CM

## 2019-10-08 MED ORDER — OMEPRAZOLE 20 MG PO CPDR: DELAYED_RELEASE_CAPSULE | ORAL | 1 refills | Status: DC

## 2019-11-26 ENCOUNTER — Telehealth: Payer: Self-pay

## 2019-11-26 DIAGNOSIS — E119 Type 2 diabetes mellitus without complications: Secondary | ICD-10-CM

## 2019-11-26 MED ORDER — BLOOD GLUCOSE MONITOR KIT: PACK | 0 refills | Status: DC

## 2019-11-26 NOTE — Telephone Encounter (Signed)
[place] Night - Client Nonclinical Telephone Record AccessNurse Client [place] Night - Client Client Site [place] Physician Alma Friendly - NP Contact Type Call Who Is Calling Patient / Member / Family / Caregiver Caller Name Christin Bach Phone Number [phone] option 3, letter faxed. Patient Name Justin Baldwin Patient DOB 06/26/1976 Call Type Message Only Information Provided Reason for Call Request for General Office Information Initial Comment Lynnae January of [place], calling in a medication denial for the PT. Medication: OneTouch Verio Strips. Additional Comment Provided information for the office. Disp. Time Disposition Final User 11/25/2019 8:34:47 AM General Information Provided Yes Rosana Fret Call Closed By: Rosana Fret Transaction Date/Time: 11/25/2019 8:32:09 AM (ET)

## 2019-11-26 NOTE — Telephone Encounter (Signed)
Noted the denial for the test strips. Will notified patient and discuss.

## 2019-12-04 ENCOUNTER — Other Ambulatory Visit: Payer: Self-pay | Admitting: Primary Care

## 2019-12-04 DIAGNOSIS — E785 Hyperlipidemia, unspecified: Secondary | ICD-10-CM

## 2019-12-04 DIAGNOSIS — I700 Atherosclerosis of aorta: Secondary | ICD-10-CM

## 2019-12-04 MED ORDER — ATORVASTATIN CALCIUM 40 MG PO TABS: 40.0000 mg | ORAL_TABLET | Freq: Every day | ORAL | 1 refills | Status: DC

## 2019-12-10 ENCOUNTER — Ambulatory Visit (INDEPENDENT_AMBULATORY_CARE_PROVIDER_SITE_OTHER): Payer: BC Managed Care – PPO | Admitting: Primary Care

## 2019-12-10 ENCOUNTER — Encounter: Payer: Self-pay | Admitting: Primary Care

## 2019-12-10 ENCOUNTER — Other Ambulatory Visit: Payer: Self-pay

## 2019-12-10 VITALS — BP 128/82 | HR 78 | Temp 97.6°F | Ht 73.0 in

## 2019-12-10 DIAGNOSIS — E785 Hyperlipidemia, unspecified: Secondary | ICD-10-CM | POA: Diagnosis not present

## 2019-12-10 DIAGNOSIS — E119 Type 2 diabetes mellitus without complications: Secondary | ICD-10-CM

## 2019-12-10 DIAGNOSIS — N529 Male erectile dysfunction, unspecified: Secondary | ICD-10-CM

## 2019-12-10 DIAGNOSIS — Z23 Encounter for immunization: Secondary | ICD-10-CM | POA: Diagnosis not present

## 2019-12-10 LAB — HEPATIC FUNCTION PANEL
ALT: 23 U/L (ref 0–53)
AST: 21 U/L (ref 0–37)
Albumin: 4.2 g/dL (ref 3.5–5.2)
Alkaline Phosphatase: 27 U/L — ABNORMAL LOW (ref 39–117)
Bilirubin, Direct: 0.1 mg/dL (ref 0.0–0.3)
Total Bilirubin: 0.6 mg/dL (ref 0.2–1.2)
Total Protein: 7.1 g/dL (ref 6.0–8.3)

## 2019-12-10 LAB — LIPID PANEL
Cholesterol: 149 mg/dL (ref 0–200)
HDL: 23 mg/dL — ABNORMAL LOW (ref >39.00)
LDL Cholesterol: 109 mg/dL — ABNORMAL HIGH (ref 0–99)
NonHDL: 125.54
Total CHOL/HDL Ratio: 6
Triglycerides: 82 mg/dL (ref 0.0–149.0)
VLDL: 16.4 mg/dL (ref 0.0–40.0)

## 2019-12-10 LAB — POCT GLYCOSYLATED HEMOGLOBIN (HGB A1C): Hemoglobin A1C: 5.4 % (ref 4.0–5.6)

## 2019-12-10 MED ORDER — INSULIN GLARGINE 100 UNIT/ML ~~LOC~~ SOLN: 10.0000 [IU] | Freq: Every day | SUBCUTANEOUS | 11 refills | Status: DC

## 2019-12-10 MED ORDER — BLOOD GLUCOSE MONITOR KIT: PACK | 0 refills | Status: DC

## 2019-12-10 MED ORDER — TADALAFIL 5 MG PO TABS: 5.0000 mg | ORAL_TABLET | Freq: Every day | ORAL | 0 refills | Status: DC | PRN

## 2019-12-10 NOTE — Progress Notes (Signed)
Subjective:    Patient ID: Justin Baldwin, male    DOB: 05-Jul-1976, 44 y.o.   MRN: [id number]  HPI  This visit occurred during the SARS-CoV-2 public health emergency.  Safety protocols were in place, including screening questions prior to the visit, additional usage of staff PPE, and extensive cleaning of exam room while observing appropriate contact time as indicated for disinfecting solutions.   [name] is a 44 year old male who presents today for follow up of diabetes. He is feeling much better, has lost weight through diet and exercise.   Current medications include: Lantus 15 units nightly, Metformin 1000 mg BID.   He is checking his blood glucose 3 times daily and is getting readings of:  AM fasting: 80-90's 1 hour after lunch: 100-120 Bedtime: 100-120  Last A1C: 13.3 in November 2020, 5.4 today  Last Eye Exam: Completed in November 2020 Last Foot Exam: Due today  Pneumonia Vaccination: Due today ACE/ARB: None. Urine microalbumin negative in November 2020 Statin: atorvastatin   BP Readings from Last 3 Encounters:  12/10/19 128/82  09/10/19 128/80  09/04/19 137/84     Review of Systems  Constitutional: Negative for fatigue.  Respiratory: Negative for shortness of breath.   Cardiovascular: Negative for chest pain.  Neurological: Negative for dizziness, numbness and headaches.       Past Medical History:  Diagnosis Date  . Diabetes mellitus without complication ([place])   . Erectile dysfunction   . Insomnia   . Skin abnormality    hidradenitis suppurativa      Social History   Socioeconomic History  . Marital status: Married    Spouse name: Not on file  . Number of children: Not on file  . Years of education: Not on file  . Highest education level: Not on file  Occupational History  . Not on file  Tobacco Use  . Smoking status: Current Every Day Smoker    Packs/day: 1.00    Types: Cigarettes  . Smokeless tobacco: Former Systems developer    Quit date: 02/14/2016    Substance and Sexual Activity  . Alcohol use: Not Currently    Alcohol/week: 5.0 standard drinks    Types: 5 Shots of liquor per week    Comment: occasinal   . Drug use: No  . Sexual activity: Not on file  Other Topics Concern  . Not on file  Social History Narrative   Married.   2 children.   Works as a Nutritional therapist   Enjoys spending time with family.    Social Determinants of Health   Financial Resource Strain:   . Difficulty of Paying Living Expenses: Not on file  Food Insecurity:   . Worried About Charity fundraiser in the Last Year: Not on file  . Ran Out of Food in the Last Year: Not on file  Transportation Needs:   . Lack of Transportation (Medical): Not on file  . Lack of Transportation (Non-Medical): Not on file  Physical Activity:   . Days of Exercise per Week: Not on file  . Minutes of Exercise per Session: Not on file  Stress:   . Feeling of Stress : Not on file  Social Connections:   . Frequency of Communication with Friends and Family: Not on file  . Frequency of Social Gatherings with Friends and Family: Not on file  . Attends Religious Services: Not on file  . Active Member of Clubs or Organizations: Not on file  . Attends Club  or Organization Meetings: Not on file  . Marital Status: Not on file  Intimate Partner Violence:   . Fear of Current or Ex-Partner: Not on file  . Emotionally Abused: Not on file  . Physically Abused: Not on file  . Sexually Abused: Not on file    Past Surgical History:  Procedure Laterality Date  . IRRIGATION AND DEBRIDEMENT ABSCESS N/A 09/22/2018   Procedure: IRRIGATION AND DEBRIDEMENT ABSCESS;  Surgeon: Ralene Ok, MD;  Location: [facility];  Service: General;  Laterality: N/A;    Family History  Problem Relation Age of Onset  . Heart attack Father   . Emphysema Paternal Grandmother   . Arthritis Mother     No Known Allergies  Current Outpatient Medications on File Prior to Visit  Medication Sig Dispense  Refill  . atorvastatin (LIPITOR) 40 MG tablet Take 1 tablet (40 mg total) by mouth daily. For cholesterol. 90 tablet 1  . glucose blood test strip Use three times daily as instructed 100 each 3  . Insulin Syringe-Needle U-100 (INSULIN SYRINGE 1CC/31GX5/16") 31G X 5/16" 1 ML MISC Use nightly with insulin. 100 each 0  . metFORMIN (GLUCOPHAGE) 1000 MG tablet Take 1 tablet (1,000 mg total) by mouth 2 (two) times daily with a meal. For diabetes. 180 tablet 1  . omeprazole (PRILOSEC) 20 MG capsule TAKE 1 CAPSULE(20 MG) BY MOUTH DAILY FOR HEARTBURN 90 capsule 1  . oxymetazoline (AFRIN) 0.05 % nasal spray Place 1-2 sprays into both nostrils as needed for congestion.      No current facility-administered medications on file prior to visit.    BP 128/82   Pulse 78   Temp 97.6 F (36.4 C) (Temporal)   Ht 6\' 1"  (1.854 m)   SpO2 98%   BMI 30.81 kg/m    Objective:   Physical Exam  Constitutional: He appears well-nourished.  Cardiovascular: Normal rate and regular rhythm.  Respiratory: Effort normal and breath sounds normal.  Musculoskeletal:     Cervical back: Neck supple.  Skin: Skin is warm and dry.  Psychiatric: He has a normal mood and affect.           Assessment & Plan:

## 2019-12-10 NOTE — Assessment & Plan Note (Signed)
Refill provided for Cialis.  Referral placed to Urology as requested.

## 2019-12-10 NOTE — Addendum Note (Signed)
Addended by: Jacqualin Combes on: 12/10/2019 10:16 AM   Modules accepted: Orders

## 2019-12-10 NOTE — Assessment & Plan Note (Signed)
Significant improvement with A1C today of 5.4!!! Commended him on weight loss efforts and dietary changes.  Foot exam today. Pneumonia vaccination today. Eye exam UTD. Managed on statin. Urine microalbumin UTD.  Reduce Lantus by 5 units every 2-3 weeks until off. He will send updates via my chart. Continue Metformin 1000 mg BID.  Follow up in 6 months.

## 2019-12-10 NOTE — Patient Instructions (Signed)
We've reduced your Lantus to 10 units. Hold this for two weeks, monitor glucose readings, update me. If levels remain stable after 2-3 weeks then okay to reduce further to 5 units. Please update me at this point.  You will be contacted regarding your referral to Urology.  Please let us know if you have not been contacted within two weeks.   Please schedule a follow up appointment in 6 months for diabetes check.   It was a pleasure to see you today! Great job!!

## 2019-12-11 MED ORDER — ROSUVASTATIN CALCIUM 20 MG PO TABS: 20.0000 mg | ORAL_TABLET | Freq: Every evening | ORAL | 3 refills | Status: DC

## 2020-01-05 ENCOUNTER — Other Ambulatory Visit: Payer: Self-pay | Admitting: Primary Care

## 2020-01-05 DIAGNOSIS — K219 Gastro-esophageal reflux disease without esophagitis: Secondary | ICD-10-CM

## 2020-02-06 DIAGNOSIS — N5201 Erectile dysfunction due to arterial insufficiency: Secondary | ICD-10-CM | POA: Diagnosis not present

## 2020-03-05 ENCOUNTER — Other Ambulatory Visit: Payer: Self-pay | Admitting: Primary Care

## 2020-03-05 DIAGNOSIS — E119 Type 2 diabetes mellitus without complications: Secondary | ICD-10-CM

## 2020-04-07 DIAGNOSIS — F411 Generalized anxiety disorder: Secondary | ICD-10-CM | POA: Diagnosis not present

## 2020-04-11 DIAGNOSIS — F411 Generalized anxiety disorder: Secondary | ICD-10-CM | POA: Diagnosis not present

## 2020-04-18 DIAGNOSIS — F411 Generalized anxiety disorder: Secondary | ICD-10-CM | POA: Diagnosis not present

## 2020-05-12 DIAGNOSIS — F411 Generalized anxiety disorder: Secondary | ICD-10-CM | POA: Diagnosis not present

## 2020-05-19 DIAGNOSIS — F411 Generalized anxiety disorder: Secondary | ICD-10-CM | POA: Diagnosis not present

## 2020-05-27 DIAGNOSIS — F419 Anxiety disorder, unspecified: Secondary | ICD-10-CM | POA: Diagnosis not present

## 2020-05-28 DIAGNOSIS — F418 Other specified anxiety disorders: Secondary | ICD-10-CM | POA: Diagnosis not present

## 2020-06-06 ENCOUNTER — Other Ambulatory Visit: Payer: Self-pay | Admitting: Primary Care

## 2020-06-06 DIAGNOSIS — E119 Type 2 diabetes mellitus without complications: Secondary | ICD-10-CM

## 2020-06-09 ENCOUNTER — Ambulatory Visit: Payer: BC Managed Care – PPO | Admitting: Primary Care

## 2020-06-09 DIAGNOSIS — F419 Anxiety disorder, unspecified: Secondary | ICD-10-CM | POA: Diagnosis not present

## 2020-06-16 ENCOUNTER — Other Ambulatory Visit: Payer: Self-pay

## 2020-06-16 ENCOUNTER — Encounter: Payer: Self-pay | Admitting: Primary Care

## 2020-06-16 ENCOUNTER — Ambulatory Visit (INDEPENDENT_AMBULATORY_CARE_PROVIDER_SITE_OTHER): Payer: BC Managed Care – PPO | Admitting: Primary Care

## 2020-06-16 VITALS — BP 136/86 | HR 82 | Temp 95.8°F | Ht 73.0 in

## 2020-06-16 DIAGNOSIS — E119 Type 2 diabetes mellitus without complications: Secondary | ICD-10-CM

## 2020-06-16 DIAGNOSIS — F419 Anxiety disorder, unspecified: Secondary | ICD-10-CM | POA: Diagnosis not present

## 2020-06-16 DIAGNOSIS — E785 Hyperlipidemia, unspecified: Secondary | ICD-10-CM | POA: Diagnosis not present

## 2020-06-16 DIAGNOSIS — K219 Gastro-esophageal reflux disease without esophagitis: Secondary | ICD-10-CM

## 2020-06-16 DIAGNOSIS — I700 Atherosclerosis of aorta: Secondary | ICD-10-CM

## 2020-06-16 LAB — COMPREHENSIVE METABOLIC PANEL
ALT: 23 U/L (ref 0–53)
AST: 22 U/L (ref 0–37)
Albumin: 4.1 g/dL (ref 3.5–5.2)
Alkaline Phosphatase: 28 U/L — ABNORMAL LOW (ref 39–117)
BUN: 12 mg/dL (ref 6–23)
CO2: 29 mEq/L (ref 19–32)
Calcium: 9.1 mg/dL (ref 8.4–10.5)
Chloride: 99 mEq/L (ref 96–112)
Creatinine, Ser: 1.28 mg/dL (ref 0.40–1.50)
GFR: 61.06 mL/min (ref >60.00)
Glucose, Bld: 119 mg/dL — ABNORMAL HIGH (ref 70–99)
Potassium: 5.2 mEq/L — ABNORMAL HIGH (ref 3.5–5.1)
Sodium: 134 mEq/L — ABNORMAL LOW (ref 135–145)
Total Bilirubin: 0.8 mg/dL (ref 0.2–1.2)
Total Protein: 7.3 g/dL (ref 6.0–8.3)

## 2020-06-16 LAB — LIPID PANEL
Cholesterol: 134 mg/dL (ref 0–200)
HDL: 24.2 mg/dL — ABNORMAL LOW (ref >39.00)
LDL Cholesterol: 95 mg/dL (ref 0–99)
NonHDL: 109.99
Total CHOL/HDL Ratio: 6
Triglycerides: 77 mg/dL (ref 0.0–149.0)
VLDL: 15.4 mg/dL (ref 0.0–40.0)

## 2020-06-16 LAB — POCT GLYCOSYLATED HEMOGLOBIN (HGB A1C): Hemoglobin A1C: 5.8 % — AB (ref 4.0–5.6)

## 2020-06-16 MED ORDER — METFORMIN HCL ER 500 MG PO TB24: 1000.0000 mg | ORAL_TABLET | Freq: Every day | ORAL | 3 refills | Status: DC

## 2020-06-16 MED ORDER — OMEPRAZOLE 20 MG PO CPDR: DELAYED_RELEASE_CAPSULE | ORAL | 3 refills | Status: DC

## 2020-06-16 NOTE — Patient Instructions (Signed)
Stop by the lab prior to leaving today. I will notify you of your results once received.   Stop Lantus insulin for diabetes.  Start metformin XR 500 mg. Take 2 tablets once daily for diabetes.  Continue to work on a healthy diet and exercise.  Monitor your blood sugars as discussed, notify me if you see readings consistently at or above 150.  Please schedule a follow up appointment in 6 months.   It was a pleasure to see you today!

## 2020-06-16 NOTE — Assessment & Plan Note (Signed)
Doing well on omeprazole, continue same. 

## 2020-06-16 NOTE — Progress Notes (Signed)
Subjective:    Patient ID: Justin Baldwin, male    DOB: 12/26/1975, 44 y.o.   MRN: [id number]  HPI  This visit occurred during the SARS-CoV-2 public health emergency.  Safety protocols were in place, including screening questions prior to the visit, additional usage of staff PPE, and extensive cleaning of exam room while observing appropriate contact time as indicated for disinfecting solutions.   Justin Baldwin is a 44 year old male with a history of atherosclerosis of aorta, GERD, type 2 diabetes, renal insufficiency who presents today for follow up of diabetes. He is also requesting a refill of omeprazole.  Current medications include: Lantus 10 units HS, Metformin 1000 mg BID. He's actually doing 500 mg of metformin once daily and 15 units of Lantus.  He is checking his blood glucose 3 times daily and is getting readings of:  AM fasting: 90-110 30-45 min after eating lunch: 130's Bedtime: 130's-150's  Last A1C: 5.4 in February 2021, 5.8 today. Last Eye Exam: UTD Last Foot Exam: Due Pneumonia Vaccination: UTD ACE/ARB: Negative, urine micro negative, due in November 2021 Statin: Crestor   BP Readings from Last 3 Encounters:  06/16/20 136/86  12/10/19 128/82  09/10/19 128/80   He is exercising often at the gym, will experience episodes of hypoglycemia with dizziness/lightheadedness.    Review of Systems  Eyes: Negative for visual disturbance.  Respiratory: Negative for shortness of breath.   Cardiovascular: Negative for chest pain.  Neurological: Positive for dizziness and light-headedness. Negative for numbness.       Past Medical History:  Diagnosis Date  . Diabetes mellitus without complication ([place])   . Erectile dysfunction   . Insomnia   . Skin abnormality    hidradenitis suppurativa      Social History   Socioeconomic History  . Marital status: Married    Spouse name: Not on file  . Number of children: Not on file  . Years of education: Not on file  . Highest  education level: Not on file  Occupational History  . Not on file  Tobacco Use  . Smoking status: Current Every Day Smoker    Packs/day: 1.00    Types: Cigarettes  . Smokeless tobacco: Former Systems developer    Quit date: 02/14/2016  Vaping Use  . Vaping Use: Never used  Substance and Sexual Activity  . Alcohol use: Not Currently    Alcohol/week: 5.0 standard drinks    Types: 5 Shots of liquor per week    Comment: occasinal   . Drug use: No  . Sexual activity: Not on file  Other Topics Concern  . Not on file  Social History Narrative   Married.   2 children.   Works as a Nutritional therapist   Enjoys spending time with family.    Social Determinants of Health   Financial Resource Strain:   . Difficulty of Paying Living Expenses: Not on file  Food Insecurity:   . Worried About Charity fundraiser in the Last Year: Not on file  . Ran Out of Food in the Last Year: Not on file  Transportation Needs:   . Lack of Transportation (Medical): Not on file  . Lack of Transportation (Non-Medical): Not on file  Physical Activity:   . Days of Exercise per Week: Not on file  . Minutes of Exercise per Session: Not on file  Stress:   . Feeling of Stress : Not on file  Social Connections:   . Frequency of Communication with  Friends and Family: Not on file  . Frequency of Social Gatherings with Friends and Family: Not on file  . Attends Religious Services: Not on file  . Active Member of Clubs or Organizations: Not on file  . Attends Archivist Meetings: Not on file  . Marital Status: Not on file  Intimate Partner Violence:   . Fear of Current or Ex-Partner: Not on file  . Emotionally Abused: Not on file  . Physically Abused: Not on file  . Sexually Abused: Not on file    Past Surgical History:  Procedure Laterality Date  . IRRIGATION AND DEBRIDEMENT ABSCESS N/A 09/22/2018   Procedure: IRRIGATION AND DEBRIDEMENT ABSCESS;  Surgeon: Ralene Ok, MD;  Location: [facility];  Service:  General;  Laterality: N/A;    Family History  Problem Relation Age of Onset  . Heart attack Father   . Emphysema Paternal Grandmother   . Arthritis Mother     No Known Allergies  Current Outpatient Medications on File Prior to Visit  Medication Sig Dispense Refill  . blood glucose meter kit and supplies KIT Dispense based on patient and insurance preference. Use up to four times daily as directed. (FOR ICD-9 250.00, 250.01). 1 each 0  . glucose blood test strip Use three times daily as instructed 100 each 3  . Insulin Syringe-Needle U-100 (INSULIN SYRINGE 1CC/31GX5/16") 31G X 5/16" 1 ML MISC Use nightly with insulin. 100 each 0  . oxymetazoline (AFRIN) 0.05 % nasal spray Place 1-2 sprays into both nostrils as needed for congestion.     . rosuvastatin (CRESTOR) 20 MG tablet Take 1 tablet (20 mg total) by mouth every evening. For cholesterol. 90 tablet 3  . tadalafil (CIALIS) 5 MG tablet Take 1 tablet (5 mg total) by mouth daily as needed for erectile dysfunction. 90 tablet 0   No current facility-administered medications on file prior to visit.    BP 136/86   Pulse 82   Temp (!) 95.8 F (35.4 C) (Temporal)   Ht 6' 1"  (1.854 m)   BMI 30.81 kg/m    Objective:   Physical Exam Cardiovascular:     Rate and Rhythm: Normal rate and regular rhythm.  Pulmonary:     Effort: Pulmonary effort is normal.     Breath sounds: Normal breath sounds.  Musculoskeletal:     Cervical back: Neck supple.  Skin:    General: Skin is warm and dry.  Psychiatric:        Mood and Affect: Mood normal.            Assessment & Plan:

## 2020-06-16 NOTE — Assessment & Plan Note (Signed)
A1C today of 5.8 which is still under excellent control. Given that he's experiencing hypoglycemic episodes at the gym while on insulin, we will need to discontinue.  Stop Lantus.  Switch Metformin to XR version given GI upset with plain. Increase dose to XR 1000 mg daily.  He will continue to monitor glucose and notify if he sees readings consistently at or above 150.  Follow up in 6 months.

## 2020-06-16 NOTE — Assessment & Plan Note (Signed)
LDL goal of <70. Compliant to rosuvastatin 20 mg. Repeat lipids pending.

## 2020-06-17 ENCOUNTER — Other Ambulatory Visit: Payer: Self-pay | Admitting: Primary Care

## 2020-06-17 DIAGNOSIS — I700 Atherosclerosis of aorta: Secondary | ICD-10-CM

## 2020-06-17 DIAGNOSIS — E785 Hyperlipidemia, unspecified: Secondary | ICD-10-CM

## 2020-06-17 MED ORDER — ROSUVASTATIN CALCIUM 40 MG PO TABS: 40.0000 mg | ORAL_TABLET | Freq: Every evening | ORAL | 3 refills | Status: DC

## 2020-06-19 DIAGNOSIS — F418 Other specified anxiety disorders: Secondary | ICD-10-CM | POA: Diagnosis not present

## 2020-06-23 DIAGNOSIS — F419 Anxiety disorder, unspecified: Secondary | ICD-10-CM | POA: Diagnosis not present

## 2020-06-25 DIAGNOSIS — E119 Type 2 diabetes mellitus without complications: Secondary | ICD-10-CM

## 2020-06-26 DIAGNOSIS — F418 Other specified anxiety disorders: Secondary | ICD-10-CM | POA: Diagnosis not present

## 2020-06-27 MED ORDER — METFORMIN HCL ER 500 MG PO TB24: 500.0000 mg | ORAL_TABLET | Freq: Every day | ORAL | 3 refills | Status: DC

## 2020-07-01 DIAGNOSIS — F419 Anxiety disorder, unspecified: Secondary | ICD-10-CM | POA: Diagnosis not present

## 2020-07-07 DIAGNOSIS — F419 Anxiety disorder, unspecified: Secondary | ICD-10-CM | POA: Diagnosis not present

## 2020-07-08 DIAGNOSIS — F418 Other specified anxiety disorders: Secondary | ICD-10-CM | POA: Diagnosis not present

## 2020-07-14 DIAGNOSIS — F419 Anxiety disorder, unspecified: Secondary | ICD-10-CM | POA: Diagnosis not present

## 2020-07-15 DIAGNOSIS — F418 Other specified anxiety disorders: Secondary | ICD-10-CM | POA: Diagnosis not present

## 2020-07-21 DIAGNOSIS — F419 Anxiety disorder, unspecified: Secondary | ICD-10-CM | POA: Diagnosis not present

## 2020-07-25 ENCOUNTER — Telehealth: Payer: Self-pay

## 2020-07-25 DIAGNOSIS — H6692 Otitis media, unspecified, left ear: Secondary | ICD-10-CM | POA: Diagnosis not present

## 2020-07-25 DIAGNOSIS — J019 Acute sinusitis, unspecified: Secondary | ICD-10-CM | POA: Diagnosis not present

## 2020-07-25 DIAGNOSIS — J069 Acute upper respiratory infection, unspecified: Secondary | ICD-10-CM | POA: Diagnosis not present

## 2020-07-25 NOTE — Telephone Encounter (Signed)
Left message to return call to our office.  

## 2020-07-25 NOTE — Telephone Encounter (Signed)
Pt already has my chart video appt scheduled for 07/28/20 at 8:20  With Dr Einar Pheasant.

## 2020-07-25 NOTE — Telephone Encounter (Signed)
Can we find out if he has tested for Covid yet?

## 2020-07-25 NOTE — Telephone Encounter (Signed)
[place] Night - Client Nonclinical Telephone Record AccessNurse Client [place] Night - Client Client Site [place] Physician Alma Friendly - NP Contact Type Call Who Is Calling Patient / Member / Family / Caregiver Caller Name [place] Phone Number [phone] Patient Name Justin Baldwin Patient DOB 10-18-1976 Call Type Message Only Information Provided Reason for Call Request to Schedule Office Appointment Initial Comment Caller states he wanted to schedule an appointment. Caller states he has like a head cold for 2 weeks and he was nausea yesterday. Additional Comment Caller declined triage and office hours was provided, Disp. Time Disposition Final User 07/25/2020 7:21:39 AM General Information Provided Yes Josephine Cables Call Closed By: Josephine Cables Transaction Date/Time: 07/25/2020 7:18:44 AM (ET)

## 2020-07-25 NOTE — Telephone Encounter (Signed)
Should we see about Covid testing today?

## 2020-07-28 ENCOUNTER — Telehealth: Payer: BC Managed Care – PPO | Admitting: Family Medicine

## 2020-07-28 ENCOUNTER — Other Ambulatory Visit: Payer: Self-pay

## 2020-07-28 NOTE — Telephone Encounter (Signed)
Pt late canceled his appointment.

## 2020-07-28 NOTE — Progress Notes (Signed)
Pt was called for visit and canceled appointment.

## 2020-07-29 DIAGNOSIS — F418 Other specified anxiety disorders: Secondary | ICD-10-CM | POA: Diagnosis not present

## 2020-08-04 DIAGNOSIS — F419 Anxiety disorder, unspecified: Secondary | ICD-10-CM | POA: Diagnosis not present

## 2020-08-06 DIAGNOSIS — F418 Other specified anxiety disorders: Secondary | ICD-10-CM | POA: Diagnosis not present

## 2020-08-06 DIAGNOSIS — N5201 Erectile dysfunction due to arterial insufficiency: Secondary | ICD-10-CM | POA: Diagnosis not present

## 2020-08-11 ENCOUNTER — Ambulatory Visit (INDEPENDENT_AMBULATORY_CARE_PROVIDER_SITE_OTHER): Payer: BC Managed Care – PPO | Admitting: Family Medicine

## 2020-08-11 ENCOUNTER — Encounter: Payer: Self-pay | Admitting: Family Medicine

## 2020-08-11 ENCOUNTER — Other Ambulatory Visit: Payer: Self-pay

## 2020-08-11 VITALS — BP 116/78 | HR 80 | Temp 98.0°F | Wt 223.1 lb

## 2020-08-11 DIAGNOSIS — H6992 Unspecified Eustachian tube disorder, left ear: Secondary | ICD-10-CM

## 2020-08-11 DIAGNOSIS — J351 Hypertrophy of tonsils: Secondary | ICD-10-CM

## 2020-08-11 MED ORDER — FLUTICASONE PROPIONATE 50 MCG/ACT NA SUSP: 2.0000 | Freq: Every day | NASAL | 1 refills | Status: DC

## 2020-08-11 NOTE — Patient Instructions (Signed)
Good to see you today  I think you have a eustachian tube disorder of your left ear and post nasal drainage. I also recommend you start your daily antihistamine   If not better in a couple of days, please let me know and I will put in ENT referral

## 2020-08-11 NOTE — Progress Notes (Signed)
Subjective:    Patient ID: Justin Baldwin, male    DOB: 10/05/1976, 44 y.o.   MRN: [id number]  HPI Chief Complaint  Patient presents with  . Otalgia    left ear, pt states had head cold mid September that turn into ear infection beginning of October. Went to UC and was prescribed antibiotics, pt states completed antibiotics but left ear is still hurning,   This is a 44 yo male who presents today with above cc. Started with symptoms mid Sept. Had head cold symptoms, sinus issues, felt like settled in left ear, sore throat on that side. Took antibiotic x 10 days (unsure which one), some improvement but still with pain radiating from ear to throat. Feels stuffy. Has had multiple negative Covid tests while having symptoms. Seasonal allergies. Takes allegra prn, hasn't been taking.    Review of Systems    No fever, chills, SOB, cough or wheeze Objective:   Physical Exam Vitals reviewed.  Constitutional:      General: He is not in acute distress.    Appearance: Normal appearance. He is normal weight. He is not ill-appearing, toxic-appearing or diaphoretic.  HENT:     Head: Normocephalic and atraumatic.     Right Ear: Tympanic membrane, ear canal and external ear normal.     Left Ear: Ear canal and external ear normal.     Ears:     Comments: Dull TM.     Nose: Congestion present.     Mouth/Throat:     Mouth: Mucous membranes are moist.     Comments: Left tonsil > right. No exudate, no focal erythema.  Eyes:     Conjunctiva/sclera: Conjunctivae normal.  Cardiovascular:     Rate and Rhythm: Normal rate and regular rhythm.     Heart sounds: Normal heart sounds.  Pulmonary:     Effort: Pulmonary effort is normal.     Breath sounds: Normal breath sounds.  Musculoskeletal:     Cervical back: Normal range of motion and neck supple. No rigidity or tenderness.  Lymphadenopathy:     Cervical: No cervical adenopathy.  Skin:    General: Skin is warm and dry.  Neurological:     Mental  Status: He is alert and oriented to person, place, and time.  Psychiatric:        Mood and Affect: Mood normal.        Behavior: Behavior normal.        Thought Content: Thought content normal.        Judgment: Judgment normal.      BP 116/78   Pulse 80   Temp 98 F (36.7 C) (Temporal)   Wt 223 lb 2 oz (101.2 kg)   SpO2 95%   BMI 29.44 kg/m  Wt Readings from Last 3 Encounters:  08/11/20 223 lb 2 oz (101.2 kg)  09/10/19 233 lb 8 oz (105.9 kg)  09/04/19 242 lb 11.6 oz (110.1 kg)        Assessment & Plan:  1. Eustachian tube disorder, left - discussed adding decongestant, long acting antihistamine - fluticasone (FLONASE) 50 MCG/ACT nasal spray; Place 2 sprays into both nostrils daily.  Dispense: 16 g; Refill: 1  2. Swelling of tonsil - it sounds as though he has had coverage for strep with 10 day course antibiotic - discussed finding, had patient visualize in mirror, if any increased swelling or no improvement, he will let me know and I will refer to ENT.   This visit  occurred during the SARS-CoV-2 public health emergency.  Safety protocols were in place, including screening questions prior to the visit, additional usage of staff PPE, and extensive cleaning of exam room while observing appropriate contact time as indicated for disinfecting solutions.    Clarene Reamer, FNP-BC  [facility]  08/11/2020 10:49 AM

## 2020-08-19 DIAGNOSIS — H9202 Otalgia, left ear: Secondary | ICD-10-CM | POA: Diagnosis not present

## 2020-08-19 DIAGNOSIS — J343 Hypertrophy of nasal turbinates: Secondary | ICD-10-CM | POA: Diagnosis not present

## 2020-08-19 DIAGNOSIS — F17210 Nicotine dependence, cigarettes, uncomplicated: Secondary | ICD-10-CM | POA: Diagnosis not present

## 2020-08-19 DIAGNOSIS — J3501 Chronic tonsillitis: Secondary | ICD-10-CM | POA: Diagnosis not present

## 2020-08-27 DIAGNOSIS — F419 Anxiety disorder, unspecified: Secondary | ICD-10-CM | POA: Diagnosis not present

## 2020-09-05 DIAGNOSIS — F418 Other specified anxiety disorders: Secondary | ICD-10-CM | POA: Diagnosis not present

## 2020-09-10 MED ORDER — INSULIN GLARGINE 100 UNIT/ML ~~LOC~~ SOLN: 10.0000 [IU] | Freq: Every day | SUBCUTANEOUS | 2 refills | Status: DC

## 2020-09-15 DIAGNOSIS — F419 Anxiety disorder, unspecified: Secondary | ICD-10-CM | POA: Diagnosis not present

## 2020-09-19 DIAGNOSIS — R070 Pain in throat: Secondary | ICD-10-CM | POA: Diagnosis not present

## 2020-09-19 DIAGNOSIS — C099 Malignant neoplasm of tonsil, unspecified: Secondary | ICD-10-CM | POA: Diagnosis not present

## 2020-09-19 DIAGNOSIS — F17210 Nicotine dependence, cigarettes, uncomplicated: Secondary | ICD-10-CM | POA: Diagnosis not present

## 2020-09-19 DIAGNOSIS — J343 Hypertrophy of nasal turbinates: Secondary | ICD-10-CM | POA: Diagnosis not present

## 2020-09-19 DIAGNOSIS — J358 Other chronic diseases of tonsils and adenoids: Secondary | ICD-10-CM | POA: Diagnosis not present

## 2020-09-24 DIAGNOSIS — F418 Other specified anxiety disorders: Secondary | ICD-10-CM | POA: Diagnosis not present

## 2020-09-26 ENCOUNTER — Telehealth: Payer: Self-pay | Admitting: Hematology and Oncology

## 2020-09-26 NOTE — Telephone Encounter (Signed)
Received a new pt referral from Dr. Redmond Baseman for Malignant neoplasm of tonsil. Justin Baldwin has been cld and scheduled to see Dr. Chryl Heck on 12/15 at 11am. Pt aware to arrive 15 minutes early.

## 2020-09-29 ENCOUNTER — Other Ambulatory Visit: Payer: Self-pay | Admitting: Otolaryngology

## 2020-09-29 ENCOUNTER — Other Ambulatory Visit: Payer: Self-pay

## 2020-09-29 DIAGNOSIS — C099 Malignant neoplasm of tonsil, unspecified: Secondary | ICD-10-CM

## 2020-09-29 NOTE — Progress Notes (Signed)
Oncology Nurse Navigator Documentation  Placed introductory call to new referral patient Nikki Rusnak  Introduced myself as the H&N oncology nurse navigator that works with Dr. Isidore Moos and Dr. Chryl Heck to whom he has been referred by Dr. Redmond Baseman. He confirmed understanding of referral.  Briefly explained my role as his navigator, provided my contact information.   Confirmed understanding of upcoming appts and [place] location, explained arrival and registration process.  I explained the purpose of a dental evaluation prior to starting RT, indicated he would be contacted by [facility] to arrange an appt.    I encouraged him to call with questions/concerns as he moves forward with appts and procedures.    He verbalized understanding of information provided, expressed appreciation for my call.   Navigator Initial Assessment . Employment Status: He is working full-time as a English as a second language teacher.  . Currently on [employer] / STD: no . Living Situation: He lives with his wife and two children.  . Support System: family . PCP: Alma Friendly NP . PCD: Dr. Arman Bogus . Financial Concerns: no . Transportation Needs: no . Sensory Deficits: no . Language Barriers/Interpreter Needed:  no . Ambulation Needs: no . DME Used in Home: no . Psychosocial Needs:  no . Concerns/Needs Understanding Cancer:  addressed/answered by navigator to best of ability . Self-Expressed Needs: no   Harlow Asa RN, BSN, OCN Head & Neck Oncology Nurse [place] [facility] Phone # [phone]  Fax # [phone]

## 2020-09-30 ENCOUNTER — Ambulatory Visit
Admission: RE | Admit: 2020-09-30 | Discharge: 2020-09-30 | Disposition: A | Discharge status: Home / Self Care | Payer: BC Managed Care – PPO | Source: Ambulatory Visit | Attending: Otolaryngology | Admitting: Otolaryngology

## 2020-09-30 ENCOUNTER — Other Ambulatory Visit: Payer: Self-pay

## 2020-09-30 DIAGNOSIS — C099 Malignant neoplasm of tonsil, unspecified: Secondary | ICD-10-CM

## 2020-09-30 DIAGNOSIS — R590 Localized enlarged lymph nodes: Secondary | ICD-10-CM | POA: Diagnosis not present

## 2020-09-30 DIAGNOSIS — M47812 Spondylosis without myelopathy or radiculopathy, cervical region: Secondary | ICD-10-CM | POA: Diagnosis not present

## 2020-09-30 DIAGNOSIS — J392 Other diseases of pharynx: Secondary | ICD-10-CM | POA: Diagnosis not present

## 2020-09-30 MED ORDER — IOPAMIDOL (ISOVUE-300) INJECTION 61%
75.0000 mL | Freq: Once | INTRAVENOUS | Status: AC | PRN
  Administered 2020-09-30: 75 mL via INTRAVENOUS

## 2020-09-30 NOTE — Progress Notes (Signed)
Head and Neck Cancer Location of Tumor / Histology:  Squamous cell carcinoma of LEFT tonsil  Patient presented ~2 months ago with symptoms of: his whole family was sick with URI symptoms including ear and throat symptoms. Flu, strep, and COVID testing was negative. He developed symptoms about a week later and has not felt well since then. His nose is constantly stuffy. The left side of the throat and ear were hurting. He was treated with a 10 day course of antibiotics, perhaps a cephalosporin, without benefit. In follow-up, he was told to take Flonase and Sudafed that were not helpful. Today (08/19/2020 visit with Dr. Redmond Baseman), the left ear and throat are sore. He has not had fever. He is able to eat and drink. Symptoms are worse in the mornings.   Biopsies revealed:  09/19/2020 --Final Pathologic Diagnosis   TONSIL, BIOPSY:              Squamous cell carcinoma, HPV associated --Special Stains   Immunohistochemistry is performed on block A1.  The controls are adequate and representative tissue is present for evaluation. p40:  Highlights tumor cells. p16:  Positive; diffusely positive in tumor cells. CD45:  Highlights background lymphoid cells; negative in the tumor cells.  Nutrition Status Yes No Comments  Weight changes? []  [x]    Swallowing concerns? []  [x]    PEG? []  [x]     Referrals Yes No Comments  Social Work? [x]  []    Dentistry? [x]  []    Swallowing therapy? [x]  []    Nutrition? [x]  []    Med/Onc? [x]  []     Safety Issues Yes No Comments  Prior radiation? []  [x]    Pacemaker/ICD? []  [x]    Possible current pregnancy? []  [x]  N/A  Is the patient on methotrexate? []  [x]     Tobacco/Marijuana/Snuff/ETOH use: Patient is a current everyday smoker and occasionally drinks alcohol. Denies any illicit drug use  Past/Anticipated interventions by otolaryngology, if any:  09/19/2020 Dr. Melida Quitter Biopsy of left tonsil mass  Past/Anticipated interventions by medical oncology, if any:   Scheduled for consult with Dr. Benay Pike on 10/08/2020    Current Complaints / other details:  Nothing of note

## 2020-10-01 ENCOUNTER — Ambulatory Visit
Admission: RE | Admit: 2020-10-01 | Discharge: 2020-10-01 | Disposition: A | Discharge status: Home / Self Care | Payer: BC Managed Care – PPO | Source: Ambulatory Visit | Attending: Radiation Oncology | Admitting: Radiation Oncology

## 2020-10-01 ENCOUNTER — Other Ambulatory Visit: Payer: Self-pay

## 2020-10-01 ENCOUNTER — Encounter: Payer: Self-pay | Admitting: General Practice

## 2020-10-01 ENCOUNTER — Encounter: Payer: Self-pay | Admitting: Radiation Oncology

## 2020-10-01 VITALS — BP 134/81 | HR 78 | Temp 97.8°F | Resp 18 | Ht 72.0 in | Wt 227.6 lb

## 2020-10-01 DIAGNOSIS — C090 Malignant neoplasm of tonsillar fossa: Secondary | ICD-10-CM | POA: Diagnosis not present

## 2020-10-01 DIAGNOSIS — Z79899 Other long term (current) drug therapy: Secondary | ICD-10-CM | POA: Diagnosis not present

## 2020-10-01 DIAGNOSIS — N529 Male erectile dysfunction, unspecified: Secondary | ICD-10-CM | POA: Diagnosis not present

## 2020-10-01 DIAGNOSIS — M47812 Spondylosis without myelopathy or radiculopathy, cervical region: Secondary | ICD-10-CM | POA: Insufficient documentation

## 2020-10-01 DIAGNOSIS — F17210 Nicotine dependence, cigarettes, uncomplicated: Secondary | ICD-10-CM | POA: Insufficient documentation

## 2020-10-01 DIAGNOSIS — H9202 Otalgia, left ear: Secondary | ICD-10-CM | POA: Diagnosis not present

## 2020-10-01 DIAGNOSIS — E119 Type 2 diabetes mellitus without complications: Secondary | ICD-10-CM | POA: Diagnosis not present

## 2020-10-01 DIAGNOSIS — Z794 Long term (current) use of insulin: Secondary | ICD-10-CM | POA: Insufficient documentation

## 2020-10-01 DIAGNOSIS — C099 Malignant neoplasm of tonsil, unspecified: Secondary | ICD-10-CM | POA: Diagnosis not present

## 2020-10-01 NOTE — Progress Notes (Signed)
Radiation Oncology         [phone] ________________________________  Initial Outpatient Consultation  Name: Justin Baldwin MRN: [id number]  Date: 10/01/2020  DOB: 03/24/1976  ZP:[id number], Leticia Penna, NP  Melida Quitter, MD   REFERRING PHYSICIAN: Melida Quitter, MD  DIAGNOSIS: C09.0   ICD-10-CM   1. Squamous cell carcinoma of tonsil (HCC)  C09.9 Ambulatory referral to Social Work  2. Cancer of tonsillar fossa (HCC)  C09.0    Cancer Staging Cancer of tonsillar fossa ([place]) Staging form: Pharynx - HPV-Mediated Oropharynx, AJCC 8th Edition - Clinical stage from 10/01/2020: Stage II (cT3, cN1, cM0, p16+) - Signed by Eppie Gibson, MD on 10/01/2020   CHIEF COMPLAINT: Here to discuss management of tonsil cancer  HISTORY OF PRESENT ILLNESS::Justin Baldwin is a 44 y.o. male who presented with ongoing head cold symptoms, sinus issues, left ear pain, and left-sided throat pain since September.  When the cold-like symptoms resolved, the throat and ear pain did not. Augmentin was prescribed without relief.  Subsequently, the patient saw Dr. Redmond Baseman, ENT at [facility], who noted that the left tonsil had a mass appearance on 09/19/2020. 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  Pertinent imaging thus far includes soft tissue neck CT scan on 09/30/2020 revealed a left tonsillar mass abutting the glossotonsillar sulcus and base of the tongue, consistent with known malignancy. Left level 2/3 cystic or necrotic adenopathy reflected nodal metastasis. CT scan of chest performed on 09/30/2020 revealing a non-specific left lower lobe pulmonary micronodule but no acute intrathoracic abnormality and no definite intrathoracic metastasis.  I have personally reviewed his imaging and discussed him with Dr. Redmond Baseman.  PET scan is pending.  Swallowing issues, if any: Denies  Weight Changes: Denies  Pain status: He has left ear pain and left-sided throat pain  Tobacco history,  if any: He is a current everyday smoker and occasionally drinks alcohol  ETOH abuse, if any: Occasional alcohol  Denies illicit drug use  He works as a Insurance claims handler.  He is here with his wife today and she is very supportive.  Prior cancers, if any: None  PREVIOUS RADIATION THERAPY: No  PAST MEDICAL HISTORY:  has a past medical history of Diabetes mellitus without complication ([place]), Erectile dysfunction, Insomnia, and Skin abnormality.    PAST SURGICAL HISTORY: Past Surgical History:  Procedure Laterality Date  . IRRIGATION AND DEBRIDEMENT ABSCESS N/A 09/22/2018   Procedure: IRRIGATION AND DEBRIDEMENT ABSCESS;  Surgeon: Ralene Ok, MD;  Location: [facility];  Service: General;  Laterality: N/A;    FAMILY HISTORY: family history includes Arthritis in his mother; Emphysema in his paternal grandmother; Heart attack in his father.  SOCIAL HISTORY:  reports that he has been smoking cigarettes. He has been smoking about 1.00 pack per day. He quit smokeless tobacco use about 4 years ago. He reports previous alcohol use of about 5.0 standard drinks of alcohol per week. He reports that he does not use drugs.  ALLERGIES: Patient has no known allergies.  MEDICATIONS:  Current Outpatient Medications  Medication Sig Dispense Refill  . blood glucose meter kit and supplies KIT Dispense based on patient and insurance preference. Use up to four times daily as directed. (FOR ICD-9 250.00, 250.01). 1 each 0  . fluticasone (FLONASE) 50 MCG/ACT nasal spray Place 2 sprays into both nostrils daily. 16 g 1  . glucose blood test strip Use three times daily as instructed 100 each 3  . insulin  glargine (LANTUS) 100 UNIT/ML injection Inject 0.1 mLs (10 Units total) into the skin daily. 10 mL 2  . metFORMIN (GLUCOPHAGE XR) 500 MG 24 hr tablet Take 1 tablet (500 mg total) by mouth daily with breakfast. For diabetes. 90 tablet 3  . omeprazole (PRILOSEC) 20 MG capsule TAKE 1 CAPSULE BY MOUTH DAILY  FOR HEARTBURN 90 capsule 3  . oxymetazoline (AFRIN) 0.05 % nasal spray Place 1-2 sprays into both nostrils as needed for congestion.     . rosuvastatin (CRESTOR) 40 MG tablet Take 1 tablet (40 mg total) by mouth every evening. For cholesterol. 90 tablet 3  . tadalafil (CIALIS) 5 MG tablet Take 1 tablet (5 mg total) by mouth daily as needed for erectile dysfunction. 90 tablet 0   No current facility-administered medications for this encounter.    REVIEW OF SYSTEMS:  Notable for that above.   PHYSICAL EXAM:  height is 6' (1.829 m) and weight is 227 lb 9.6 oz (103.2 kg). His temperature is 97.8 F (36.6 C). His blood pressure is 134/81 and his pulse is 78. His respiration is 18 and oxygen saturation is 98%.   General: Alert and oriented, in no acute distress HEENT: Head is normocephalic. Extraocular movements are intact. Oropharynx is notable for a left tonsillar mass, exophytic but submucosal with small areas ulcerating through the mucosa, approximately 3-1/2 to 4 cm in dimension, touching the uvula but not crossing midline. Neck: Neck is notable for a very muscular neck, difficult to palpate adenopathy Heart: Regular in rate and rhythm with no murmurs, rubs, or gallops. Chest: Clear to auscultation bilaterally, with no rhonchi, wheezes, or rales. Abdomen: Soft, nontender, nondistended, with no rigidity or guarding. Extremities: No cyanosis or edema. Lymphatics: see Neck Exam Skin: No concerning lesions.  Multiple tattoos Musculoskeletal: symmetric strength and muscle tone throughout. Neurologic: Cranial nerves II through XII are grossly intact. No obvious focalities. Speech is fluent. Coordination is intact. Psychiatric: Judgment and insight are intact. Affect is appropriate.   ECOG = 0  0 - Asymptomatic (Fully active, able to carry on all predisease activities without restriction)  1 - Symptomatic but completely ambulatory (Restricted in physically strenuous activity but ambulatory and  able to carry out work of a light or sedentary nature. For example, light housework, office work)  2 - Symptomatic, <50% in bed during the day (Ambulatory and capable of all self care but unable to carry out any work activities. Up and about more than 50% of waking hours)  3 - Symptomatic, >50% in bed, but not bedbound (Capable of only limited self-care, confined to bed or chair 50% or more of waking hours)  4 - Bedbound (Completely disabled. Cannot carry on any self-care. Totally confined to bed or chair)  5 - Death   Eustace Pen MM, Creech RH, Tormey DC, et al. [phone]). "Toxicity and response criteria of the [facility] Group". [place] Oncol. 5 (6): [phone]   LABORATORY DATA:  Lab Results  Component Value Date   WBC 10.0 09/04/2019   HGB 16.5 09/04/2019   HCT 49.0 09/04/2019   MCV 90.1 09/04/2019   PLT 221 09/04/2019   CMP     Component Value Date/Time   NA 134 (L) 06/16/2020 0914   NA 135 09/12/2019 1048   K 5.2 (H) 06/16/2020 0914   CL 99 06/16/2020 0914   CO2 29 06/16/2020 0914   GLUCOSE 119 (H) 06/16/2020 0914   BUN 12 06/16/2020 0914   BUN 18 09/12/2019 1048  CREATININE 1.28 06/16/2020 0914   CREATININE 1.38 (H) 11/30/2018 1102   CALCIUM 9.1 06/16/2020 0914   PROT 7.3 06/16/2020 0914   ALBUMIN 4.1 06/16/2020 0914   AST 22 06/16/2020 0914   ALT 23 06/16/2020 0914   ALKPHOS 28 (L) 06/16/2020 0914   BILITOT 0.8 06/16/2020 0914   GFRNONAA 62 09/12/2019 1048   GFRNONAA 63 11/30/2018 1102   GFRAA 72 09/12/2019 1048   GFRAA 73 11/30/2018 1102      Lab Results  Component Value Date   TSH 1.07 11/08/2017     RADIOGRAPHY: CT SOFT TISSUE NECK W CONTRAST  Result Date: 09/30/2020 CLINICAL DATA:  New tonsil cancer EXAM: CT NECK WITH CONTRAST TECHNIQUE: Multidetector CT imaging of the neck was performed using the standard protocol following the bolus administration of intravenous contrast. CONTRAST:  66m ISOVUE-300 IOPAMIDOL (ISOVUE-300) INJECTION 61%  COMPARISON:  None. FINDINGS: Pharynx and larynx: There is a peripherally enhancing hypoattenuating lesion within the left palatine tonsil measuring approximately 1.8 x 1.9 x 2.6 cm. This abuts the glossotonsillar sulcus and base of tongue. No significant airway compromise. Otherwise unremarkable. Salivary glands: Parotid and submandibular glands are unremarkable. Thyroid: Normal thyroid. Lymph nodes: Nonenlarged and enlarged left level 2 and 3 nodes with cystic change or necrosis. Largest at level 2 measures 2.4 cm on axial image 35. Vascular: Major neck vessels are patent. Limited intracranial: No abnormal enhancement in the included posterior fossa. Visualized orbits: Not included. Mastoids and visualized paranasal sinuses: Aerated. Skeleton: Degenerative changes of the cervical spine, greatest at C5-C6 at C6-C7. Upper chest: Dictated separately. Other: None. IMPRESSION: Left tonsillar mass abutting the glossotonsillar sulcus and base of tongue consistent with known malignancy. Left level 2/3 cystic or necrotic adenopathy reflecting nodal metastasis. Electronically Signed   By: PMacy MisM.D.   On: 09/30/2020 15:13   CT CHEST W CONTRAST  Result Date: 09/30/2020 CLINICAL DATA:  Tonsillar cancer new diagnosis. No history of surgeries. Smoker. EXAM: CT CHEST WITH CONTRAST TECHNIQUE: Multidetector CT imaging of the chest was performed during intravenous contrast administration. CONTRAST:  76mISOVUE-300 IOPAMIDOL (ISOVUE-300) INJECTION 61% COMPARISON:  None. FINDINGS: Cardiovascular: Normal heart size. No significant pericardial effusion. The thoracic aorta is normal in caliber. No atherosclerotic plaque of the thoracic aorta. No coronary artery calcifications. The pulmonary artery is normal in caliber. No central pulmonary embolus. Mediastinum/Nodes: No enlarged mediastinal, hilar, or axillary lymph nodes. Thyroid gland, trachea, and esophagus demonstrate no significant findings. Lungs/Pleura: Incidentally  noted azygos fissure. No focal consolidation. Nonspecific left lower lobe pulmonary micronodule (5:87). No pulmonary mass. No pleural effusion. No pneumothorax. Upper Abdomen: No acute abnormality. Musculoskeletal: No chest wall abnormality No suspicious lytic or blastic osseous lesions. No acute displaced fracture. Multilevel degenerative changes of the spine. IMPRESSION: 1. No acute intrathoracic abnormality and no definite intrathoracic metastasis in a patient with tonsillar malignancy. 2. Nonspecific left lower lobe pulmonary micronodule. Attention on follow-up. 3. Please see separately dictated CT neck 09/30/2020. Electronically Signed   By: MoIven Finn.D.   On: 09/30/2020 15:21      IMPRESSION/PLAN:  This is a delightful patient with head and neck cancer.  I have discussed him with Dr. BaRedmond Baseman Dr[facility]  I explained to the patient that he is two standard treatment options.  The first would be transoral robotic surgery followed by 6 to 6-1/2 weeks of radiation therapy.  There is some chance that he would also  need chemotherapy, even if he undergoes surgery.  The second standard option would be 7 weeks of concurrent radiation and chemotherapy, holding surgery for salvage  The main advantage of pursuing surgery is that it would potentially help him avoid chemotherapy.  He will talk to otolaryngology at [facility] about the likelihood of this (as well as the feasibility of surgery.)  Based on my measurements on his sagittal images, I believe his tumor is just over 4 cm in greatest dimension.  We discussed his staging which I would estimate to be T3N1, HPV positive, stage II.  We discussed the potential risks, benefits, and side effects of radiotherapy. We talked in detail about acute and late effects. We discussed that some of the most bothersome acute effects may be mucositis, dysgeusia, salivary changes,  skin irritation, hair loss, dehydration, weight loss and fatigue. We talked about late effects which include but are not necessarily limited to dysphagia, hypothyroidism, nerve injury, vascular injury, spinal cord injury, xerostomia, trismus, neck edema, and potential injury to any of the tissues in the head and neck region. No guarantees of treatment were given. A consent form was signed and placed in the patient's medical record. The patient is enthusiastic about proceeding with treatment. I look forward to participating in the patient's care.    We also discussed that the treatment of head and neck cancer is a multidisciplinary process to maximize treatment outcomes and quality of life. For this reason the following referrals have been or will be made:   Medical oncology to discuss chemotherapy    Dentistry for dental evaluation, possible extractions in the radiation fields, and /or advice on reducing risk of cavities, osteoradionecrosis, or other oral issues.   Nutritionist for nutrition support during and after treatment.   Speech language pathology for swallowing and/or speech therapy.   Social work for social support.    Physical therapy due to risk of lymphedema in neck and deconditioning.   Baseline labs including TSH.  I asked the patient today about tobacco use. The patient uses tobacco.  I advised the patient to quit. Services were offered by me today including outpatient counseling and pharmacotherapy. I assessed for the willingness to attempt to quit and provided encouragement and demonstrated willingness to make referrals and/or prescriptions to help the patient attempt to quit. The patient has follow-up with the oncologic team to touch base on their tobacco use and /or cessation efforts.  Over 3 minutes were spent on this issue.  He plans to quit on December 15 and has a plan to purchase nicotine patches.  I also advised him not to drink anymore alcohol for the rest of his life,  ideally, for best prognosis as alcohol can also be carcinogenic, even in HPV positive cases.  We discussed measures to reduce the risk of infection during the COVID-19 pandemic.  He has not yet been vaccinated.  I strongly recommended that he get vaccinated.  We talked about the pros and cons of the vaccine and the potential risks of getting Covid during oncologic therapy.  He will think about this but does not consent to a vaccine today.  He knows to contact me if he has more questions.  On date of service, in total, I spent 70 minutes on this encounter. Patient was seen in person.  __________________________________________   Eppie Gibson, MD  This document serves as a record of services personally performed by Eppie Gibson, MD. It was created on his behalf by Clerance Lav, a trained  medical scribe. The creation of this record is based on the scribe's personal observations and the provider's statements to them. This document has been checked and approved by the attending provider.

## 2020-10-01 NOTE — Progress Notes (Signed)
[place] Work  Initial Assessment   Justin Baldwin is a 44 y.o. year old male contacted by phone Clinical Social Work was referred by radiation oncologist for assessment of psychosocial needs.   SDOH (Social Determinants of Health) assessments performed: Yes   Distress Screen completed: Yes ONCBCN DISTRESS SCREENING 10/01/2020  Screening Type Initial Screening  Distress experienced in past week (1-10) 7  Practical problem type Work/school  Family Problem type Partner;Children  Emotional problem type Depression;Nervousness/Anxiety;Adjusting to illness;Feeling hopeless;Adjusting to appearance changes  Information Concerns Type Lack of info about diagnosis;Lack of info about treatment;Lack of info about complementary therapy choices;Lack of info about maintaining fitness  Physical Problem type Pain;Talking  Physician notified of physical symptoms Yes  Referral to clinical psychology No  Referral to clinical social work Yes  Referral to dietition Yes  Referral to financial advocate Yes  Referral to support programs Yes  Referral to palliative care No      Family/Social Information:  . Housing Arrangement: patient lives with spouse, 2 kids (63 and 78) . Family members/support persons in your life? Spouse . Transportation concerns: none . Employment: Working full time.  . Income source:self employed, owns his own business.  "I am booked w appointments til June 2022 - I would like appointments in early mornings."   . Financial concerns: Yes, due to illness and/or loss of work during treatment o Type of concern: having to change prescheduled appointments and impact on his business . Food access concerns: no . Religious or spiritual practice: did not ask . Medication Concerns:no . Services Currently in place:  none  Coping/ Adjustment to diagnosis: . Patient understands treatment plan and what happens next? Diagnosed w tonsillar cancer after having a sore throat which did not  respond to conventional treatments.  Was very surprised about this diagnosis and noq concerned about how he will be able to manage treatment and maintaining his business. Has not told his young children about the diagnosis, will discuss w them at some point. . Concerns about diagnosis and/or treatment: How I will care for other members of my family and job . 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 Hopes and priorities: spending time w family, maintaining his business which involves drawing . Patient enjoys time with family/ friends and art . Current coping skills/ strengths: Average or above average intelligence, Capable of independent living, Communication skills, Financial means and Supportive family/friends   ONCBCN DISTRESS SCREENING 10/01/2020  Screening Type Initial Screening  Distress experienced in past week (1-10) 7  Practical problem type Work/school  Family Problem type Partner;Children  Emotional problem type Depression;Nervousness/Anxiety;Adjusting to illness;Feeling hopeless;Adjusting to appearance changes  Information Concerns Type Lack of info about diagnosis;Lack of info about treatment;Lack of info about complementary therapy choices;Lack of info about maintaining fitness  Physical Problem type Pain;Talking  Physician notified of physical symptoms Yes  Referral to clinical psychology No  Referral to clinical social work Yes  Referral to dietition Yes  Referral to financial advocate Yes  Referral to support programs Yes  Referral to palliative care No      SUMMARY: Current SDOH Barriers:  .  none at this time  Interventions: . Discussed common feeling and emotions when being diagnosed with cancer, and the importance of support during treatment . Informed patient of the support team roles and support services at [facility] . Provided CSW contact information and encouraged patient to call with any questions or concerns . Provided education regarding  processing his  diagnosis and treatment w children, will mail information packet as well as information on [place] resources   Follow Up Plan: Patient will contact CSW with any support or resource needs   Patient verbalizes understanding of plan: Yes    Beverely Pace , LCSW

## 2020-10-01 NOTE — Progress Notes (Signed)
Oncology Nurse Navigator Documentation  Met with patient during initial consult with Dr. Isidore Moos.  He was accompanied by his wife Cyril Mourning.  . Further introduced myself as his/their Navigator, explained my role as a member of the Care Team. . Provided New Patient Information packet: o Contact information for physician, this navigator, other members of the Care Team o Advance Directive information ([employer] blue pamphlet with LCSW insert); provided [employer] AD booklet at his request,  o Fall Prevention Patient [place] sheet o Symptom [facility] information o [facility] map with highlight of [place] o SLP Information sheet . Provided and discussed educational handouts for PEG and PAC. Marland Kitchen Assisted with post-consult appt scheduling. . Showed them the location of Dr. Raynelle Dick office and [facility] Radiology as reference for future appts, including arrival procedure for these appts.   . They verbalized understanding of information provided. . I encouraged them to call with questions/concerns moving forward.  Harlow Asa, RN, BSN, OCN Head & Neck Oncology Nurse [place] at [place] [phone]

## 2020-10-03 ENCOUNTER — Encounter: Payer: Self-pay | Admitting: Radiation Oncology

## 2020-10-03 NOTE — Progress Notes (Signed)
Dental Form with Estimates of Radiation Dose      Diagnosis:    ICD-10-CM   1. Squamous cell carcinoma of tonsil (HCC)  C09.9 Ambulatory referral to Social Work  2. Cancer of tonsillar fossa ([place])  C09.0     Prognosis: curative, pending pet  Anticipated # of fractions: 30-70    Daily?: yes  # of weeks of radiotherapy: 6-7  Chemotherapy?: maybe  Anticipated xerostomia:  Mild permanent   Pre-simulation needs:   Scatter protection    Simulation: ASAP ; surgery (TORS) possible upfront   Other Notes: I believe I can mold 50Gy from his LEFT roots but will be close;  Not sure if you think some teeth are in condition that warrants extraction regardless.  Will not get close to RIGHT roots. PET is pending. Thanks!  Please contact Eppie Gibson, MD, with patient's disposition after evaluation and/or dental treatment.

## 2020-10-06 DIAGNOSIS — F419 Anxiety disorder, unspecified: Secondary | ICD-10-CM | POA: Diagnosis not present

## 2020-10-07 ENCOUNTER — Other Ambulatory Visit: Payer: Self-pay

## 2020-10-07 ENCOUNTER — Encounter: Payer: Self-pay | Admitting: Hematology and Oncology

## 2020-10-07 ENCOUNTER — Ambulatory Visit (HOSPITAL_COMMUNITY): Payer: Self-pay | Admitting: Dentistry

## 2020-10-07 DIAGNOSIS — K06010 Localized gingival recession, unspecified: Secondary | ICD-10-CM

## 2020-10-07 DIAGNOSIS — K08409 Partial loss of teeth, unspecified cause, unspecified class: Secondary | ICD-10-CM

## 2020-10-07 DIAGNOSIS — K117 Disturbances of salivary secretion: Secondary | ICD-10-CM

## 2020-10-07 DIAGNOSIS — K0510 Chronic gingivitis, plaque induced: Secondary | ICD-10-CM

## 2020-10-07 DIAGNOSIS — K029 Dental caries, unspecified: Secondary | ICD-10-CM

## 2020-10-07 DIAGNOSIS — K036 Deposits [accretions] on teeth: Secondary | ICD-10-CM

## 2020-10-07 DIAGNOSIS — K030 Excessive attrition of teeth: Secondary | ICD-10-CM

## 2020-10-07 NOTE — Patient Instructions (Signed)
[facility] Dr. Debe Coder B. Maribeth Jiles Phone: [phone] Fax: [phone]   It was a pleasure seeing you today!  Please refer to the information below regarding your dental visit with [place], and call us should you have any questions or concerns that may come up after you leave.    RADIATION THERAPY AND INFORMATION REGARDING YOUR TEETH   [x] Xerostomia (Dry Mouth) Your salivary glands may be in the field of radiation.  Radiation may include all or only part of your salivary glands.  This will cause your saliva to dry up, and you will have a dry mouth.  The dry mouth will be for the rest of your life unless your radiation oncologist tells you otherwise.  Your saliva has many functions:  It wets your tongue for speaking.  It coats your teeth and the inside of your mouth for easier movement.  It helps with chewing and swallowing food.  It helps clean away harmful acid and toxic products made by the germs in your mouth, therefore it helps prevent cavities.  It kills some germs in your mouth and helps to prevent gum disease.  It helps to carry flavor to your taste buds.  Once you have lost your saliva you will be at higher risk for tooth decay and gum disease.    What can be done to help improve your mouth when there's not enough saliva:  Your dentist may give a prescription for Salagen.  It will not bring back all of your saliva but may bring back some of it.  Also, your saliva may be thick and ropy or white and foamy. It will not feel like it use to feel.  You will need to swish with water every time your mouth feels dry.  YOU CANNOT suck on any cough drops, mints, lemon drops, candy, vitamin C or any other products.  You cannot use anything other than water to make your mouth feel less dry.  If you want to drink anything else, you have to drink it all at once and brush afterwards.  Be sure to discuss the details of your diet habits with your dentist or  hygienist.  [x] Radiation caries:  This is decay (cavities) that happens very quickly once your mouth is very dry due to radiation therapy.  Normally, cavities take six months to two years to become a problem.  When you have dry mouth, cavities may take as little as eight weeks to cause you a problem.    Dental check-ups every two months are necessary as long as you have a dry mouth. Radiation caries typically, but not always, start at your gum line where it is hard to see the cavity.  It is therefore also hard to fill these cavities adequately.  This high rate of cavities happens because your mouth no longer has saliva and therefore the acid made by the germs starts the decay process.  Whenever you eat anything the germs in your mouth change the food into acid.  The acid then burns a small hole in your tooth.  This small hole is the beginning of a cavity.  If this is not treated then it will grow bigger and become a cavity.  The way to avoid this hole getting bigger is to use fluoride every evening as prescribed by your dentist following your radiation.   NOTE:  You have to make sure that your teeth are very clean before you use the fluoride.  This fluoride in turn  will strengthen your teeth and prepare them for another day of fighting acid.  If you develop radiation caries many times, the damage is so large that you will have to have all your teeth removed.  This could be a big problem if some of these teeth are in the field of radiation.  Further details of why this could be a big problem will follow.  (See Osteoradionecrosis).  [x] Dysgeusia (Loss of Taste) This happens to varying degrees once you've had radiation therapy to your jaw region.  Many times taste is not completely lost, but becomes limited.  The loss of taste is mostly due to radiation affecting your taste buds.  However, if you have no saliva in your mouth to carry the flavor to your taste buds, it would be difficult for your taste buds  to taste anything.  That is why using water or a prescription for Salagen prior to meals and during meal times may help with some of the taste.  Keep in mind that taste generally returns very slowly over the course of several months or several years after radiation therapy.  Don't give up hope.  [x] Trismus (Limited Jaw Opening) According to your Radiation Oncologist, your TMJ or jaw joints are going to be partially or fully in the field of radiation.  This means that over time the muscles that help you open and close your mouth may get stiff.  This will potentially result in your not being able to open your mouth wide enough or as wide as you can open it now.    Let me give you an example of how slowly this happens and how unaware people are of it:   A gentlemen that had radiation therapy two years ago came back to me complaining that bananas are just too large for him to be able to fit them in between his teeth.  He was not able to open wide enough to bite into a banana.  This happens slowly and over a period of time.  What we do to try and prevent this:    Your dentist will probably give you a stack of sticks called a trismus exercise device.  This stack will help remind your muscles and your jaw joints to open up to the same distance every day.  Use these sticks every morning when you wake up, or according to the instructions given by your dentist.     You must use these sticks for at least one to two years after radiation therapy.  The reason for that is because it happens so slowly and keeps going on for about two years after radiation therapy.  Your hospital dentist will help you monitor your mouth opening and make sure that it's not getting smaller after radiation.  [x] Osteoradionecrosis (ORN)  This is a condition where your jaw bone after radiation therapy becomes very dry.  It has very little blood supply to keep it alive.  If you develop a cavity that turns into an abscess or an infection,  then the jaw bone does not have enough blood supply to help fight the infection.  At this point it is very likely that the infection could cause the death of your jaw bone.  When you have dead bone it has to be removed.  Therefore, you might end up having to have surgery to remove part of your jaw bone, the part of the jaw bone that has been affected.     Healing is also a problem  if you are to have surgery (like a tooth extraction) in the areas where the bone has had radiation therapy.  If you have surgery, you need more blood supply to heal which is not available.  When blood supply and oxygen are not available, there is a chance for the bone to die.  Occasionally, ORN happens on its own with no obvious reason, but this is quite rare.  We believe that patients who continue to smoke and/or drink alcohol have a higher chance of having this problem.  Once your jaw bone has had radiation therapy, if there are any remaining teeth in that area, it is not recommended to have them pulled unless your dentist or oral surgeon is aware of your history of radiation and believes it is safe.   The risks for ORN either from infection or spontaneously occurring (with no reason) are life long.   Thank you for giving [place] the opportunity to provide care for you.  If there is anything we can do for you, please let us know.

## 2020-10-07 NOTE — Progress Notes (Signed)
[facility] NOTE  Patient Care Team: Pleas [name], NP as PCP - General (Internal Medicine) Festus Aloe, MD as Consulting Physician (Urology) Melida Quitter, MD as Consulting Physician (Otolaryngology)  CHIEF COMPLAINTS/PURPOSE OF CONSULTATION:  [facility] tonsil  ASSESSMENT & PLAN:  No problem-specific Assessment & Plan notes found for this encounter.  Orders Placed This Encounter  Procedures  . Ambulatory referral to Social Work    Referral Priority:   Routine    Referral Type:   Consultation    Referral Reason:   Specialty Services Required    Number of Visits Requested:   1   This is a 44 yr old male patient with newly diagnosed T3( primary tumor according to TB discussion is borderline 4 cm), N1( multiple lymph nodes ipsilateral but PET pending) and M0/Stage II  referred to medical oncology for treatment consideration We reviewed his imaging, pathology and discussed possible options for treatment in TB today. He is following up with ENT team at [facility] for consideration of surgery.  Given p16 positive T3N1 disease, we have discussed multiple options.  If clear margins can be achieved by surgery then resection of primary and observe lateral or bilateral neck dissection is definitely an option which may be followed by radiation plus or minus chemotherapy depending on the adverse features.  I again discussed that adverse features for HPV positive is currently debatable although positive margins and extensive extranodal extension will be considered as an adverse feature at this time.  If clear margins cannot be achieved by surgery, then he is an excellent candidate for concurrent chemoradiation.  Given young age and excellent PS at baseline with no significant comorbidities, I recommended that he consider cisplatin every 21-day dosing at 100 mg/m for 3 doses concurrent with radiation.  We have discussed about side effects including and not limited to fatigue,  nausea, increased risk of infection, ototoxicity with occasional permanent hearing loss, nephrotoxicity which can be permanent in patients etc. He is willing to discuss with surgical team and will let Justin Baldwin know about discussion and recommendations.  He understands that if he cannot achieve a complete response with concurrent chemoradiation then he may still need salvage surgery. Discussed plan of care with Dr Isidore Moos. Thank you for consulting Justin Baldwin in the care of this patient.  Please do not hesitate to contact Justin Baldwin with any additional questions or concerns.   HISTORY OF PRESENTING ILLNESS:  Justin Baldwin 44 y.o. male is here because of new diagnosis of SCC tonsil/oropharynx.   HISTORY OF PRESENT ILLNESS::Justin Baldwin is a 44 y.o. male who presented with ongoing head cold symptoms, sinus issues, left ear pain, and left-sided throat pain since September.  When the cold-like symptoms resolved, the throat and ear pain did not. Augmentin was prescribed without relief.  Subsequently, the patient saw Dr. Redmond Baseman, ENT at [facility], who noted that the left tonsil had a mass appearance on 09/19/2020. 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  Pertinent imaging thus far includes soft tissue neck CT scan on 09/30/2020 revealed a left tonsillar mass abutting the glossotonsillar sulcus and base of the tongue, consistent with known malignancy. Left level 2/3 cystic or necrotic adenopathy reflected nodal metastasis. CT scan of chest performed on 09/30/2020 revealing a non-specific left lower lobe pulmonary micronodule but no acute intrathoracic abnormality and no definite intrathoracic metastasis.  I have personally reviewed his imaging and discussed him with Dr. Redmond Baseman.  He is here for FU with his wife. He is very healthy at baseline. Diabetes not on any medication. He complains of mild swallowing difficulty, no other complaints. He can still eat and drink everything No  hearing issues.  REVIEW OF SYSTEMS:   Constitutional: Denies fevers, chills or abnormal night sweats Eyes: Denies blurriness of vision, double vision or watery eyes Ears, nose, mouth, throat, and face: As mentioned above Respiratory: Denies cough, dyspnea or wheezes Cardiovascular: Denies palpitation, chest discomfort or lower extremity swelling Gastrointestinal:  Denies nausea, heartburn or change in bowel habits Skin: Denies abnormal skin rashes Lymphatics: Denies new lymphadenopathy or easy bruising Neurological:Denies numbness, tingling or new weaknesses Behavioral/Psych: Mood is stable, no new changes  All other systems were reviewed with the patient and are negative.  MEDICAL HISTORY:  Past Medical History:  Diagnosis Date  . Diabetes mellitus without complication ([place])   . Erectile dysfunction   . Insomnia   . Skin abnormality    hidradenitis suppurativa     SURGICAL HISTORY: Past Surgical History:  Procedure Laterality Date  . IRRIGATION AND DEBRIDEMENT ABSCESS N/A 09/22/2018   Procedure: IRRIGATION AND DEBRIDEMENT ABSCESS;  Surgeon: Ralene Ok, MD;  Location: [facility];  Service: General;  Laterality: N/A;    SOCIAL HISTORY: Social History   Socioeconomic History  . Marital status: Married    Spouse name: Not on file  . Number of children: Not on file  . Years of education: Not on file  . Highest education level: Not on file  Occupational History  . Not on file  Tobacco Use  . Smoking status: Current Every Day Smoker    Packs/day: 1.00    Types: Cigarettes  . Smokeless tobacco: Former Systems developer    Quit date: 02/14/2016  Vaping Use  . Vaping Use: Never used  Substance and Sexual Activity  . Alcohol use: Not Currently    Alcohol/week: 5.0 standard drinks    Types: 5 Shots of liquor per week    Comment: occasinal   . Drug use: No  . Sexual activity: Yes  Other Topics Concern  . Not on file  Social History Narrative   Married.   2 children.   Works as  a Nutritional therapist   Enjoys spending time with family.    Social Determinants of Health   Financial Resource Strain: Low Risk   . Difficulty of Paying Living Expenses: Not hard at all  Food Insecurity: No Food Insecurity  . Worried About Charity fundraiser in the Last Year: Never true  . Ran Out of Food in the Last Year: Never true  Transportation Needs: No Transportation Needs  . Lack of Transportation (Medical): No  . Lack of Transportation (Non-Medical): No  Physical Activity: Not on file  Stress: Not on file  Social Connections: Not on file  Intimate Partner Violence: Not on file    FAMILY HISTORY: Family History  Problem Relation Age of Onset  . Heart attack Father   . Emphysema Paternal Grandmother   . Arthritis Mother     ALLERGIES:  has No Known Allergies.  MEDICATIONS:  Current Outpatient Medications  Medication Sig Dispense Refill  . blood glucose meter kit and supplies KIT Dispense based on patient and insurance preference. Use up to four times daily as directed. (FOR ICD-9 250.00, 250.01). 1 each 0  . fluticasone (FLONASE) 50 MCG/ACT nasal spray Place 2 sprays into both nostrils daily. 16 g 1  . glucose blood test strip Use three times  daily as instructed 100 each 3  . insulin glargine (LANTUS) 100 UNIT/ML injection Inject 0.1 mLs (10 Units total) into the skin daily. 10 mL 2  . metFORMIN (GLUCOPHAGE XR) 500 MG 24 hr tablet Take 1 tablet (500 mg total) by mouth daily with breakfast. For diabetes. 90 tablet 3  . omeprazole (PRILOSEC) 20 MG capsule TAKE 1 CAPSULE BY MOUTH DAILY FOR HEARTBURN 90 capsule 3  . oxymetazoline (AFRIN) 0.05 % nasal spray Place 1-2 sprays into both nostrils as needed for congestion.     . rosuvastatin (CRESTOR) 40 MG tablet Take 1 tablet (40 mg total) by mouth every evening. For cholesterol. 90 tablet 3  . tadalafil (CIALIS) 5 MG tablet Take 1 tablet (5 mg total) by mouth daily as needed for erectile dysfunction. 90 tablet 0   No current  facility-administered medications for this visit.     PHYSICAL EXAMINATION: ECOG PERFORMANCE STATUS: 0 - Asymptomatic  Vitals:   10/08/20 1102  BP: 140/87  Pulse: 79  Resp: 18  Temp: 97.6 F (36.4 C)  SpO2: 97%   Filed Weights   10/08/20 1102  Weight: 229 lb 6.4 oz (104.1 kg)    GENERAL:alert, no distress and comfortable SKIN: skin color, texture, turgor are normal, no rashes or significant lesions EYES: normal, conjunctiva are pink and non-injected, sclera clear OROPHARYNX: enlarge left tonsil with ulceration. Neck fullness, but I cant feel a definite lymphadenopathy NECK: supple, thyroid normal size, non-tender, without nodularity LUNGS: clear to auscultation and percussion with normal breathing effort HEART: regular rate & rhythm and no murmurs and no lower extremity edema ABDOMEN:abdomen soft, non-tender and normal bowel sounds Musculoskeletal:no cyanosis of digits and no clubbing  PSYCH: alert & oriented x 3 with fluent speech NEURO: no focal motor/sensory deficits  LABORATORY DATA:  I have reviewed the data as listed Lab Results  Component Value Date   WBC 10.0 09/04/2019   HGB 16.5 09/04/2019   HCT 49.0 09/04/2019   MCV 90.1 09/04/2019   PLT 221 09/04/2019     Chemistry      Component Value Date/Time   NA 134 (L) 06/16/2020 0914   NA 135 09/12/2019 1048   K 5.2 (H) 06/16/2020 0914   CL 99 06/16/2020 0914   CO2 29 06/16/2020 0914   BUN 12 06/16/2020 0914   BUN 18 09/12/2019 1048   CREATININE 1.28 06/16/2020 0914   CREATININE 1.38 (H) 11/30/2018 1102      Component Value Date/Time   CALCIUM 9.1 06/16/2020 0914   ALKPHOS 28 (L) 06/16/2020 0914   AST 22 06/16/2020 0914   ALT 23 06/16/2020 0914   BILITOT 0.8 06/16/2020 0914       RADIOGRAPHIC STUDIES: I have personally reviewed the radiological images as listed and agreed with the findings in the report. CT SOFT TISSUE NECK W CONTRAST  Addendum Date: 10/07/2020   ADDENDUM REPORT: 10/07/2020  07:44 ADDENDUM: There is a dictation transcription error in the findings. Craniocaudal measurement should be 3.6 cm rather than 2.6 cm. Electronically Signed   By: Macy Mis M.D.   On: 10/07/2020 07:44   Result Date: 10/07/2020 CLINICAL DATA:  New tonsil cancer EXAM: CT NECK WITH CONTRAST TECHNIQUE: Multidetector CT imaging of the neck was performed using the standard protocol following the bolus administration of intravenous contrast. CONTRAST:  2m ISOVUE-300 IOPAMIDOL (ISOVUE-300) INJECTION 61% COMPARISON:  None. FINDINGS: Pharynx and larynx: There is a peripherally enhancing hypoattenuating lesion within the left palatine tonsil measuring approximately 1.8 x 1.9 x  2.6 cm. This abuts the glossotonsillar sulcus and base of tongue. No significant airway compromise. Otherwise unremarkable. Salivary glands: Parotid and submandibular glands are unremarkable. Thyroid: Normal thyroid. Lymph nodes: Nonenlarged and enlarged left level 2 and 3 nodes with cystic change or necrosis. Largest at level 2 measures 2.4 cm on axial image 35. Vascular: Major neck vessels are patent. Limited intracranial: No abnormal enhancement in the included posterior fossa. Visualized orbits: Not included. Mastoids and visualized paranasal sinuses: Aerated. Skeleton: Degenerative changes of the cervical spine, greatest at C5-C6 at C6-C7. Upper chest: Dictated separately. Other: None. IMPRESSION: Left tonsillar mass abutting the glossotonsillar sulcus and base of tongue consistent with known malignancy. Left level 2/3 cystic or necrotic adenopathy reflecting nodal metastasis. Electronically Signed: By: Macy Mis M.D. On: 09/30/2020 15:13   CT CHEST W CONTRAST  Result Date: 09/30/2020 CLINICAL DATA:  Tonsillar cancer new diagnosis. No history of surgeries. Smoker. EXAM: CT CHEST WITH CONTRAST TECHNIQUE: Multidetector CT imaging of the chest was performed during intravenous contrast administration. CONTRAST:  40m ISOVUE-300  IOPAMIDOL (ISOVUE-300) INJECTION 61% COMPARISON:  None. FINDINGS: Cardiovascular: Normal heart size. No significant pericardial effusion. The thoracic aorta is normal in caliber. No atherosclerotic plaque of the thoracic aorta. No coronary artery calcifications. The pulmonary artery is normal in caliber. No central pulmonary embolus. Mediastinum/Nodes: No enlarged mediastinal, hilar, or axillary lymph nodes. Thyroid gland, trachea, and esophagus demonstrate no significant findings. Lungs/Pleura: Incidentally noted azygos fissure. No focal consolidation. Nonspecific left lower lobe pulmonary micronodule (5:87). No pulmonary mass. No pleural effusion. No pneumothorax. Upper Abdomen: No acute abnormality. Musculoskeletal: No chest wall abnormality No suspicious lytic or blastic osseous lesions. No acute displaced fracture. Multilevel degenerative changes of the spine. IMPRESSION: 1. No acute intrathoracic abnormality and no definite intrathoracic metastasis in a patient with tonsillar malignancy. 2. Nonspecific left lower lobe pulmonary micronodule. Attention on follow-up. 3. Please see separately dictated CT neck 09/30/2020. Electronically Signed   By: MIven FinnM.D.   On: 09/30/2020 15:21   Final Pathologic Diagnosis    TONSIL, BIOPSY:              Squamous cell carcinoma, HPV associated.  Electronically signed by DGarnette Czech MD on 09/25/2020 at 3:50 PM    All questions were answered. The patient knows to call the clinic with any problems, questions or concerns.     PBenay Pike MD 10/08/2020 1:43 PM

## 2020-10-07 NOTE — Progress Notes (Addendum)
DENTAL VISIT OUTPATIENT CONSULTATION  Service Date:   10/07/2020 Referring Provider:                  Eppie Gibson, MD  Patient Name:   Justin Baldwin Date of Birth:   1976/08/20 Medical Record Number: [id number]    PLAN & RECOMMENDATIONS   >>There are no current signs of acute dental infection including abscess, edema or erythema, or suspicious lesion requiring biopsy. No treatment is recommended prior to initiating radiation therapy at this time. >>Will need to schedule delivery of scatter protection devices once simulation is scheduled.  >>The patient does have several cavities and generalized calculus accumulation that need to be addressed by his regular dentist.  Recommend he re-establish care as a patient for routine dental care following radiation therapy. >> Discussed in detail all treatment options with the patient and he is agreeable to the plan.   Thank you for consulting with [facility] and for the opportunity to participate in this patient's treatment.  Should you have any questions or concerns, please contact the [facility] at [phone].     Consult Note:  10/07/2020    COVID 19 SCREENING: The patient denies symptoms concerning for COVID-19 infection including fever, chills, cough, or newly developed shortness of breath.   HPI: Justin Baldwin is a very pleasant 44 y.o. male with recent diagnosis of SCC of the tonsil anticipating radiation therapy with h/o GERD, (+) tobacco use, atherosclerosis of the aorta, kidney insufficiency and Type 2 DM who presents today for a dental evaluation as part of their Pre-Radiation work-up.   Dental History: The patient reports that he does have a dentist he sees, but that he does not go as regularly as he should.  He said that he thinks it has been about one year since his last dental visit.  He currently denies any dental/oral pain or sensitivity. Patient able to manage oral secretions.  Patient denies dysphagia,  odynophagia, dysphonia, SOB and neck pain.  Patient denies fever, rigors and malaise.   CHIEF COMPLAINT: "I am here to see if my teeth can take the radiation I am getting."   Patient Active Problem List   Diagnosis Date Noted  . Cancer of tonsillar fossa ([place]) 10/01/2020  . Type 2 diabetes mellitus without complication, without long-term current use of insulin ([place]) 09/03/2019  . Hyperglycemia 09/03/2019  . Gastroesophageal reflux disease 03/06/2019  . Atherosclerosis of aorta ([place]) 11/30/2018  . Dizziness 11/30/2018  . Medication monitoring encounter 10/31/2018  . Atypical mycobacterium infection   . Abscess of buttock, left 09/22/2018  . Hydradenitis 09/22/2018  . Kidney insufficiency 09/22/2018  . Lump 08/31/2018  . Deep tissue injury 08/25/2018  . Skin rash 07/25/2018  . Fatigue 11/08/2017  . Vertigo 07/11/2017  . Preventative health care 03/29/2016  . Insomnia 03/15/2016  . Erectile dysfunction 03/15/2016   Past Medical History:  Diagnosis Date  . Diabetes mellitus without complication ([place])   . Erectile dysfunction   . Insomnia   . Skin abnormality    hidradenitis suppurativa    Past Surgical History:  Procedure Laterality Date  . IRRIGATION AND DEBRIDEMENT ABSCESS N/A 09/22/2018   Procedure: IRRIGATION AND DEBRIDEMENT ABSCESS;  Surgeon: Ralene Ok, MD;  Location: [facility];  Service: General;  Laterality: N/A;   No Known Allergies Current Outpatient Medications  Medication Sig Dispense Refill  . blood glucose meter kit and supplies KIT Dispense based on patient and insurance preference. Use up to four times daily  as directed. (FOR ICD-9 250.00, 250.01). 1 each 0  . fluticasone (FLONASE) 50 MCG/ACT nasal spray Place 2 sprays into both nostrils daily. 16 g 1  . glucose blood test strip Use three times daily as instructed 100 each 3  . insulin glargine (LANTUS) 100 UNIT/ML injection Inject 0.1 mLs (10 Units total) into the skin daily. 10 mL 2  . metFORMIN  (GLUCOPHAGE XR) 500 MG 24 hr tablet Take 1 tablet (500 mg total) by mouth daily with breakfast. For diabetes. 90 tablet 3  . omeprazole (PRILOSEC) 20 MG capsule TAKE 1 CAPSULE BY MOUTH DAILY FOR HEARTBURN 90 capsule 3  . oxymetazoline (AFRIN) 0.05 % nasal spray Place 1-2 sprays into both nostrils as needed for congestion.     . rosuvastatin (CRESTOR) 40 MG tablet Take 1 tablet (40 mg total) by mouth every evening. For cholesterol. 90 tablet 3  . tadalafil (CIALIS) 5 MG tablet Take 1 tablet (5 mg total) by mouth daily as needed for erectile dysfunction. 90 tablet 0   No current facility-administered medications for this visit.    LABS: Lab Results  Component Value Date   WBC 10.0 09/04/2019   HGB 16.5 09/04/2019   HCT 49.0 09/04/2019   MCV 90.1 09/04/2019   PLT 221 09/04/2019      Component Value Date/Time   NA 134 (L) 06/16/2020 0914   NA 135 09/12/2019 1048   K 5.2 (H) 06/16/2020 0914   CL 99 06/16/2020 0914   CO2 29 06/16/2020 0914   GLUCOSE 119 (H) 06/16/2020 0914   BUN 12 06/16/2020 0914   BUN 18 09/12/2019 1048   CREATININE 1.28 06/16/2020 0914   CREATININE 1.38 (H) 11/30/2018 1102   CALCIUM 9.1 06/16/2020 0914   GFRNONAA 62 09/12/2019 1048   GFRNONAA 63 11/30/2018 1102   GFRAA 72 09/12/2019 1048   GFRAA 73 11/30/2018 1102   No results found for: INR, PROTIME No results found for: PTT  Social History   Socioeconomic History  . Marital status: Married    Spouse name: Not on file  . Number of children: Not on file  . Years of education: Not on file  . Highest education level: Not on file  Occupational History  . Not on file  Tobacco Use  . Smoking status: Current Every Day Smoker    Packs/day: 1.00    Types: Cigarettes  . Smokeless tobacco: Former Systems developer    Quit date: 02/14/2016  Vaping Use  . Vaping Use: Never used  Substance and Sexual Activity  . Alcohol use: Not Currently    Alcohol/week: 5.0 standard drinks    Types: 5 Shots of liquor per week     Comment: occasinal   . Drug use: No  . Sexual activity: Yes  Other Topics Concern  . Not on file  Social History Narrative   Married.   2 children.   Works as a Nutritional therapist   Enjoys spending time with family.    Social Determinants of Health   Financial Resource Strain: Low Risk   . Difficulty of Paying Living Expenses: Not hard at all  Food Insecurity: No Food Insecurity  . Worried About Charity fundraiser in the Last Year: Never true  . Ran Out of Food in the Last Year: Never true  Transportation Needs: No Transportation Needs  . Lack of Transportation (Medical): No  . Lack of Transportation (Non-Medical): No  Physical Activity: Not on file  Stress: Not on file  Social Connections: Not  on file  Intimate Partner Violence: Not on file   Family History  Problem Relation Age of Onset  . Heart attack Father   . Emphysema Paternal Grandmother   . Arthritis Mother     REVIEW OF SYSTEMS: Reviewed with the patient as per HPI. PSYCH: (++) Dental phobia   VITAL SIGNS: BP (!) 135/95 (BP Location: Right Arm)   Pulse 68   Temp 98.7 F (37.1 C)    PHYSICAL EXAMINATION: GENERAL: Well-developed, comfortable and in no apparent distress. NEUROLOGICAL: Alert and oriented to person, place and time. EXTRAORAL:  Facial symmetry present without any edema or erythema.  No swelling or lymphadenopathy. TMJ without clicks or crepitations.   Maximum interincisal opening: 52 mm INTRAORAL: Soft tissues appear well-perfused. Mild xerostomia at baseline, slight viscous saliva.  FOM and vestibules soft and not raised. Oral cavity without mass or lesion. No signs of infection, parulis, sinus tract, edema or erythema evident upon exam. Nicotinic stomatitis on palate.  Bilateral linea alba. Localized small area of hyperkeratosis on alveolar ridge on edentulous space b/w #13 and #15.  DENTAL EXAMINATION: DENTITION: Overall good remaining dentition.  Missing teeth, existing restorations. Occlusal  attrition of Maxillary molars. Generalized staining.   The patient is maintaining fair oral hygiene. PERIODONTAL: Pink, healthy gingival tissue with blunted papilla.  Localized areas of gingival recession in regions of tooth loss adjacent to remaining dentition. DENTAL CARIES/DEFECTIVE RESTORATIONS: #3D caries, #15D arrested caries, #18D arrested caries, #28B(V) recurrent caries with defective existing composite, #29B(V) defective existing composite, #31B(V) recurrent caries with defective existing composite. CROWN/BRIDGE: Existing PFM crown on tooth #30.  Margins appear sealed and crown in good condition. PROSTHODONTIC: Patient denies wearing partial dentures. OCCLUSION: Class I molar occlusion on right side. Slight overjet noted.  RADIOGRAPHIC EXAMINATION PAN Full Mouth Series exposed and interpreted: Condyles seated bilaterally in fossas.  No evidence of abnormal pathology.  All visualized osseous structures appear WNL. Localized mild horizontal bone loss. Missing teeth, caries, existing restorations. #30 Existing full coverage crown.   ASSESSMENT 1. SCC of Tonsil 2. Type 2 Diabetes Mellitus 3. GERD 4. Atherosclerosis of the aorta 5. Missing teeth 6. Caries 7. Gingivitis 8. Gingival recession 9. Attrition 10. Accretions on teeth   PLAN/RECOMMENDATIONS  . I discussed the risks, benefits, and complications (including osteoradionecrosis of the jaw, radiation caries, tooth pain and infection, and systemic infection that could delay cancer treatment) of various treatment options with the patient in relationship to his medical and dental conditions.  Based on the clinical and radiographic exam, there are no signs of severe decay, acute infection such as edema or abscess, or questionable teeth that I would recommend addressing prior to his radiation therapy at this time.  I explained to the patient that the field of radiation may hit his upper/lower left molars, however he does not have 3rd  molars and his 2 second molars do not have cavities and are in stable condition at this time.  I discussed that it is imperative he maintain optimal oral hygiene now, and start seeing his regular dentist every 4-6 months after radiation therapy to check for radiation caries and address any issues before they become too severe.  I also discussed the use of fluoride trays which we will talk about more in depth following radiation.  We also discussed the use of his trismus device given to him today and how to practice these exercises, along with the other potential side effects of radiation. . We discussed various treatment options to include  no treatment, multiple extractions with alveoloplasty, pre-prosthetic surgery as indicated, periodontal therapy, dental restorations, root canal therapy, crown and bridge therapy, implant therapy, and replacement of missing teeth as indicated.  The patient verbalized understanding of all options, and currently wishes to proceed with recommendations of no treatment prior to radiation, and following instructions/recommendations during and after his cancer treatment to minimize future risk of complications.  . Discussion of findings with medical team and coordination of future medical and dental care as needed.   Upper and lower alginate impressions taken today and poured up in lab to fabricate scatter protection devices.  Will hold off on scheduling delivery until simulation is confirmed.  Per patient, he may be having surgery prior to starting radiation.   The patient tolerated today's visit well.  All questions and concerns were addressed and the patient departed in stable condition.   I spent in excess of 120 minutes during the conduct of this consultation and >50% of this time involved direct face-to-face encounter for counseling and/or coordination of the patient's care.   [place] Benson [place], DMD

## 2020-10-08 ENCOUNTER — Inpatient Hospital Stay: Payer: BC Managed Care – PPO

## 2020-10-08 ENCOUNTER — Other Ambulatory Visit: Payer: Self-pay

## 2020-10-08 ENCOUNTER — Encounter: Payer: Self-pay | Admitting: General Practice

## 2020-10-08 ENCOUNTER — Encounter: Payer: Self-pay | Admitting: Hematology and Oncology

## 2020-10-08 ENCOUNTER — Inpatient Hospital Stay: Payer: BC Managed Care – PPO | Attending: Hematology and Oncology | Admitting: Hematology and Oncology

## 2020-10-08 VITALS — BP 140/87 | HR 79 | Temp 97.6°F | Resp 18 | Ht 72.0 in | Wt 229.4 lb

## 2020-10-08 DIAGNOSIS — Z794 Long term (current) use of insulin: Secondary | ICD-10-CM | POA: Insufficient documentation

## 2020-10-08 DIAGNOSIS — Z01812 Encounter for preprocedural laboratory examination: Secondary | ICD-10-CM | POA: Diagnosis not present

## 2020-10-08 DIAGNOSIS — E119 Type 2 diabetes mellitus without complications: Secondary | ICD-10-CM | POA: Diagnosis not present

## 2020-10-08 DIAGNOSIS — Z888 Allergy status to other drugs, medicaments and biological substances status: Secondary | ICD-10-CM | POA: Diagnosis not present

## 2020-10-08 DIAGNOSIS — C090 Malignant neoplasm of tonsillar fossa: Secondary | ICD-10-CM

## 2020-10-08 DIAGNOSIS — C098 Malignant neoplasm of overlapping sites of tonsil: Secondary | ICD-10-CM | POA: Diagnosis not present

## 2020-10-08 DIAGNOSIS — Z20822 Contact with and (suspected) exposure to covid-19: Secondary | ICD-10-CM | POA: Diagnosis not present

## 2020-10-08 DIAGNOSIS — C099 Malignant neoplasm of tonsil, unspecified: Secondary | ICD-10-CM | POA: Diagnosis not present

## 2020-10-08 DIAGNOSIS — Z79899 Other long term (current) drug therapy: Secondary | ICD-10-CM | POA: Diagnosis not present

## 2020-10-08 DIAGNOSIS — F17210 Nicotine dependence, cigarettes, uncomplicated: Secondary | ICD-10-CM | POA: Diagnosis not present

## 2020-10-08 DIAGNOSIS — R590 Localized enlarged lymph nodes: Secondary | ICD-10-CM | POA: Diagnosis not present

## 2020-10-08 NOTE — Progress Notes (Signed)
pe

## 2020-10-09 ENCOUNTER — Telehealth: Payer: Self-pay

## 2020-10-09 ENCOUNTER — Other Ambulatory Visit: Payer: Self-pay

## 2020-10-09 DIAGNOSIS — C099 Malignant neoplasm of tonsil, unspecified: Secondary | ICD-10-CM

## 2020-10-09 NOTE — Telephone Encounter (Signed)
Spoke with Nira Conn at [facility] Radiology department. She confirmed that fax was received with PET order by Dr. Isidore Moos and insurance authorization showing [facility] as scan location. She placed order in [facility] system and confirmed she would call patient with available times to have scan tomorrow 10/10/20. Called Shelly back at Dr. Jenne Campus office and relayed above information. Provided direct call back number should their office require anything else.

## 2020-10-10 ENCOUNTER — Other Ambulatory Visit: Payer: Self-pay

## 2020-10-10 DIAGNOSIS — C099 Malignant neoplasm of tonsil, unspecified: Secondary | ICD-10-CM | POA: Diagnosis not present

## 2020-10-10 DIAGNOSIS — C090 Malignant neoplasm of tonsillar fossa: Secondary | ICD-10-CM

## 2020-10-10 DIAGNOSIS — R5381 Other malaise: Secondary | ICD-10-CM

## 2020-10-10 DIAGNOSIS — Z1329 Encounter for screening for other suspected endocrine disorder: Secondary | ICD-10-CM

## 2020-10-13 ENCOUNTER — Other Ambulatory Visit: Payer: Self-pay

## 2020-10-13 ENCOUNTER — Ambulatory Visit
Admission: RE | Admit: 2020-10-13 | Discharge: 2020-10-13 | Disposition: A | Discharge status: Home / Self Care | Payer: Self-pay | Source: Ambulatory Visit | Attending: Radiation Oncology | Admitting: Radiation Oncology

## 2020-10-13 DIAGNOSIS — C090 Malignant neoplasm of tonsillar fossa: Secondary | ICD-10-CM

## 2020-10-13 NOTE — Progress Notes (Signed)
Oncology Nurse Navigator Documentation  +++Fax sent to [facility] Radiology Imaging Library requesting the following imaging be pushed to [employer]: .  10/10/20 PET Notification of successful fax transmission received.  Order placed for assignment to [place].  Harlow Asa RN, BSN, OCN Head & Neck Oncology Nurse [place] at [facility] Phone # [phone]  Fax # [phone]

## 2020-10-14 ENCOUNTER — Other Ambulatory Visit: Payer: Self-pay | Admitting: Radiation Oncology

## 2020-10-14 ENCOUNTER — Other Ambulatory Visit (HOSPITAL_COMMUNITY): Payer: Self-pay | Admitting: Hematology and Oncology

## 2020-10-14 DIAGNOSIS — C090 Malignant neoplasm of tonsillar fossa: Secondary | ICD-10-CM

## 2020-10-14 DIAGNOSIS — F418 Other specified anxiety disorders: Secondary | ICD-10-CM | POA: Diagnosis not present

## 2020-10-14 MED ORDER — ONDANSETRON HCL 8 MG PO TABS: 8.0000 mg | ORAL_TABLET | Freq: Two times a day (BID) | ORAL | 1 refills | Status: DC | PRN

## 2020-10-14 MED ORDER — LIDOCAINE-PRILOCAINE 2.5-2.5 % EX CREA: TOPICAL_CREAM | CUTANEOUS | 3 refills | Status: DC

## 2020-10-14 MED ORDER — PROCHLORPERAZINE MALEATE 10 MG PO TABS: 10.0000 mg | ORAL_TABLET | Freq: Four times a day (QID) | ORAL | 1 refills | Status: DC | PRN

## 2020-10-14 MED ORDER — LORAZEPAM 0.5 MG PO TABS: 0.5000 mg | ORAL_TABLET | Freq: Four times a day (QID) | ORAL | 0 refills | Status: DC | PRN

## 2020-10-14 MED ORDER — DEXAMETHASONE 4 MG PO TABS: 8.0000 mg | ORAL_TABLET | Freq: Every day | ORAL | 1 refills | Status: DC

## 2020-10-14 NOTE — Progress Notes (Unsigned)
Received message from Dr Isidore Moos. Ordered PEG and port for chemo administration. Placed cisplatin plan with concurrent radiation. Inbasket messages sent for chemo teach, labs weekly, infusion weekly and appointments with me weekly.  Delvecchio Madole

## 2020-10-14 NOTE — Progress Notes (Signed)
START ON PATHWAY REGIMEN - Head and Neck     A cycle is every 21 days:     Cisplatin   **Always confirm dose/schedule in your pharmacy ordering system**  Patient Characteristics: Oropharynx, HPV Positive, Preoperative or Nonsurgical Candidate (Clinical Staging), cT0-4, cN1-3 or cT3-4, cN0 Disease Classification: Oropharynx HPV Status: Positive (+) Therapeutic Status: Preoperative or Nonsurgical Candidate (Clinical Staging) AJCC T Category: cT3 AJCC 8 Stage Grouping: II AJCC N Category: cN2 AJCC M Category: cM0 Intent of Therapy: Curative Intent, Discussed with Patient

## 2020-10-15 ENCOUNTER — Ambulatory Visit
Admission: RE | Admit: 2020-10-15 | Discharge: 2020-10-15 | Disposition: A | Discharge status: Home / Self Care | Payer: BC Managed Care – PPO | Source: Ambulatory Visit | Attending: Radiation Oncology | Admitting: Radiation Oncology

## 2020-10-15 ENCOUNTER — Other Ambulatory Visit: Payer: Self-pay

## 2020-10-15 ENCOUNTER — Ambulatory Visit (HOSPITAL_COMMUNITY): Payer: BC Managed Care – PPO | Admitting: Dentistry

## 2020-10-15 ENCOUNTER — Encounter (HOSPITAL_COMMUNITY): Payer: Self-pay

## 2020-10-15 VITALS — BP 109/71 | HR 75 | Temp 97.1°F | Resp 18 | Ht 72.0 in | Wt 226.0 lb

## 2020-10-15 DIAGNOSIS — R5381 Other malaise: Secondary | ICD-10-CM

## 2020-10-15 DIAGNOSIS — K06010 Localized gingival recession, unspecified: Secondary | ICD-10-CM

## 2020-10-15 DIAGNOSIS — Z1329 Encounter for screening for other suspected endocrine disorder: Secondary | ICD-10-CM

## 2020-10-15 DIAGNOSIS — C099 Malignant neoplasm of tonsil, unspecified: Secondary | ICD-10-CM | POA: Insufficient documentation

## 2020-10-15 DIAGNOSIS — K08409 Partial loss of teeth, unspecified cause, unspecified class: Secondary | ICD-10-CM

## 2020-10-15 DIAGNOSIS — K029 Dental caries, unspecified: Secondary | ICD-10-CM

## 2020-10-15 DIAGNOSIS — C090 Malignant neoplasm of tonsillar fossa: Secondary | ICD-10-CM

## 2020-10-15 DIAGNOSIS — K0510 Chronic gingivitis, plaque induced: Secondary | ICD-10-CM

## 2020-10-15 DIAGNOSIS — K036 Deposits [accretions] on teeth: Secondary | ICD-10-CM

## 2020-10-15 DIAGNOSIS — F17210 Nicotine dependence, cigarettes, uncomplicated: Secondary | ICD-10-CM | POA: Diagnosis not present

## 2020-10-15 LAB — COMPREHENSIVE METABOLIC PANEL
ALT: 79 U/L — ABNORMAL HIGH (ref 0–44)
AST: 35 U/L (ref 15–41)
Albumin: 4.1 g/dL (ref 3.5–5.0)
Alkaline Phosphatase: 43 U/L (ref 38–126)
Anion gap: 10 (ref 5–15)
BUN: 14 mg/dL (ref 6–20)
CO2: 28 mmol/L (ref 22–32)
Calcium: 9.2 mg/dL (ref 8.9–10.3)
Chloride: 99 mmol/L (ref 98–111)
Creatinine, Ser: 1.52 mg/dL — ABNORMAL HIGH (ref 0.61–1.24)
GFR, Estimated: 58 mL/min — ABNORMAL LOW (ref >60)
Glucose, Bld: 218 mg/dL — ABNORMAL HIGH (ref 70–99)
Potassium: 4 mmol/L (ref 3.5–5.1)
Sodium: 137 mmol/L (ref 135–145)
Total Bilirubin: 0.8 mg/dL (ref 0.3–1.2)
Total Protein: 7.9 g/dL (ref 6.5–8.1)

## 2020-10-15 LAB — CBC WITH DIFFERENTIAL/PLATELET
Abs Immature Granulocytes: 0.03 10*3/uL (ref 0.00–0.07)
Basophils Absolute: 0.1 10*3/uL (ref 0.0–0.1)
Basophils Relative: 1 %
Eosinophils Absolute: 0.1 10*3/uL (ref 0.0–0.5)
Eosinophils Relative: 1 %
HCT: 55 % — ABNORMAL HIGH (ref 39.0–52.0)
Hemoglobin: 18.7 g/dL — ABNORMAL HIGH (ref 13.0–17.0)
Immature Granulocytes: 0 %
Lymphocytes Relative: 27 %
Lymphs Abs: 2.3 10*3/uL (ref 0.7–4.0)
MCH: 31.6 pg (ref 26.0–34.0)
MCHC: 34 g/dL (ref 30.0–36.0)
MCV: 93.1 fL (ref 80.0–100.0)
Monocytes Absolute: 0.6 10*3/uL (ref 0.1–1.0)
Monocytes Relative: 7 %
Neutro Abs: 5.3 10*3/uL (ref 1.7–7.7)
Neutrophils Relative %: 64 %
Platelets: 219 10*3/uL (ref 150–400)
RBC: 5.91 MIL/uL — ABNORMAL HIGH (ref 4.22–5.81)
RDW: 12.6 % (ref 11.5–15.5)
WBC: 8.4 10*3/uL (ref 4.0–10.5)
nRBC: 0 % (ref 0.0–0.2)

## 2020-10-15 LAB — MAGNESIUM: Magnesium: 2 mg/dL (ref 1.7–2.4)

## 2020-10-15 LAB — PHOSPHORUS: Phosphorus: 2.4 mg/dL — ABNORMAL LOW (ref 2.5–4.6)

## 2020-10-15 LAB — TSH: TSH: 1.383 u[IU]/mL (ref 0.320–4.118)

## 2020-10-15 MED ORDER — SODIUM CHLORIDE 0.9% FLUSH
10.0000 mL | Freq: Once | INTRAVENOUS | Status: AC
  Administered 2020-10-15: 10 mL via INTRAVENOUS

## 2020-10-15 NOTE — Progress Notes (Signed)
Has armband been applied?  Yes.    Does patient have an allergy to IV contrast dye?: No.   Has patient ever received premedication for IV contrast dye?: No.   Does patient take metformin?: Yes.   (no longer needs to be held per diagnostic radiology protocol)   If patient does take metformin when was the last dose:  Date of lab work: October 15, 2020 BUN: 14 CR: 1.52  IV site: antecubital left, condition patent and no redness  Has IV site been added to flowsheet?  Yes.    BP 109/71 (BP Location: Left Arm, Patient Position: Sitting)   Pulse 75   Temp (!) 97.1 F (36.2 C) (Temporal)   Resp 18   Ht 6' (1.829 m)   Wt 226 lb (102.5 kg)   SpO2 97%   BMI 30.65 kg/m

## 2020-10-15 NOTE — Progress Notes (Signed)
Oncology Nurse Navigator Documentation  To provide support, encouragement and care continuity, met with Mr. Wainer during his CT SIM.  He tolerated procedure without difficulty, denied questions/concerns.  I answered questions regarding upcoming appointments and PEG/PAC.   I encouraged him to call me prior to 10/28/20 planned [place].   Harlow Asa RN, BSN, OCN Head & Neck Oncology Nurse [place] at [facility] Phone # [phone]  Fax # [phone]

## 2020-10-15 NOTE — Progress Notes (Signed)
DENTAL VISIT PROGRESS NOTE   Patient Name:   Justin Baldwin Date of Birth:   1975/11/23 Medical Record Number: [id number]    DHYAN NOAH presents today for insertion of upper and lower scatter protection devices.  Patient with no complaints.  Vitals: There were no vitals taken for this visit.  Patient Active Problem List   Diagnosis Date Noted  . Cancer of tonsillar fossa ([place]) 10/01/2020  . Type 2 diabetes mellitus without complication, without long-term current use of insulin ([place]) 09/03/2019  . Hyperglycemia 09/03/2019  . Gastroesophageal reflux disease 03/06/2019  . Atherosclerosis of aorta ([place]) 11/30/2018  . Dizziness 11/30/2018  . Medication monitoring encounter 10/31/2018  . Atypical mycobacterium infection   . Abscess of buttock, left 09/22/2018  . Hydradenitis 09/22/2018  . Kidney insufficiency 09/22/2018  . Lump 08/31/2018  . Deep tissue injury 08/25/2018  . Skin rash 07/25/2018  . Fatigue 11/08/2017  . Vertigo 07/11/2017  . Preventative health care 03/29/2016  . Insomnia 03/15/2016  . Erectile dysfunction 03/15/2016   Past Medical History:  Diagnosis Date  . Diabetes mellitus without complication ([place])   . Erectile dysfunction   . Insomnia   . Skin abnormality    hidradenitis suppurativa    Current Outpatient Medications  Medication Sig Dispense Refill  . blood glucose meter kit and supplies KIT Dispense based on patient and insurance preference. Use up to four times daily as directed. (FOR ICD-9 250.00, 250.01). 1 each 0  . dexamethasone (DECADRON) 4 MG tablet Take 2 tablets (8 mg total) by mouth daily. Take daily x 3 days starting the day after cisplatin chemotherapy. Take with food. 30 tablet 1  . fluticasone (FLONASE) 50 MCG/ACT nasal spray Place 2 sprays into both nostrils daily. 16 g 1  . glucose blood test strip Use three times daily as instructed 100 each 3  . insulin glargine (LANTUS) 100 UNIT/ML injection Inject 0.1 mLs (10 Units total) into the  skin daily. 10 mL 2  . lidocaine-prilocaine (EMLA) cream Apply to affected area once 30 g 3  . LORazepam (ATIVAN) 0.5 MG tablet Take 1 tablet (0.5 mg total) by mouth every 6 (six) hours as needed (Nausea or vomiting). 30 tablet 0  . metFORMIN (GLUCOPHAGE XR) 500 MG 24 hr tablet Take 1 tablet (500 mg total) by mouth daily with breakfast. For diabetes. 90 tablet 3  . omeprazole (PRILOSEC) 20 MG capsule TAKE 1 CAPSULE BY MOUTH DAILY FOR HEARTBURN 90 capsule 3  . ondansetron (ZOFRAN) 8 MG tablet Take 1 tablet (8 mg total) by mouth 2 (two) times daily as needed. Start on the third day after cisplatin chemotherapy. 30 tablet 1  . oxymetazoline (AFRIN) 0.05 % nasal spray Place 1-2 sprays into both nostrils as needed for congestion.     . prochlorperazine (COMPAZINE) 10 MG tablet Take 1 tablet (10 mg total) by mouth every 6 (six) hours as needed (Nausea or vomiting). 30 tablet 1  . rosuvastatin (CRESTOR) 40 MG tablet Take 1 tablet (40 mg total) by mouth every evening. For cholesterol. 90 tablet 3  . tadalafil (CIALIS) 5 MG tablet Take 1 tablet (5 mg total) by mouth daily as needed for erectile dysfunction. 90 tablet 0   No current facility-administered medications for this visit.   No Known Allergies   ASSESSMENT: Patient with anticipated radiation therapy to the head and neck.   PROCEDURE: Appliances were tried in and adjusted as needed. Polished. Postop instructions were provided in a written and verbal format concerning  the use and care of appliances.   PLAN: . Patient to return to our clinic following the completion of radiation therapy for a follow-up appointment and fluoride tray delivery.  He will then return to his regular dentist for routine dental care including cleanings and exams. . Patient instructed to call if questions or problems arise before then.   All questions and concerns were invited and addressed.  Patient tolerated today's visit well and departed in stable  condition.   [place] Benson Norway, DMD

## 2020-10-20 ENCOUNTER — Inpatient Hospital Stay (HOSPITAL_BASED_OUTPATIENT_CLINIC_OR_DEPARTMENT_OTHER): Payer: BC Managed Care – PPO | Admitting: Hematology and Oncology

## 2020-10-20 ENCOUNTER — Encounter: Payer: Self-pay | Admitting: Hematology and Oncology

## 2020-10-20 ENCOUNTER — Other Ambulatory Visit: Payer: Self-pay

## 2020-10-20 VITALS — BP 133/88 | HR 77 | Temp 97.6°F | Resp 14 | Ht 72.0 in | Wt 229.3 lb

## 2020-10-20 DIAGNOSIS — C090 Malignant neoplasm of tonsillar fossa: Secondary | ICD-10-CM | POA: Diagnosis not present

## 2020-10-20 DIAGNOSIS — F17210 Nicotine dependence, cigarettes, uncomplicated: Secondary | ICD-10-CM | POA: Diagnosis not present

## 2020-10-20 DIAGNOSIS — C099 Malignant neoplasm of tonsil, unspecified: Secondary | ICD-10-CM | POA: Diagnosis not present

## 2020-10-20 DIAGNOSIS — Z794 Long term (current) use of insulin: Secondary | ICD-10-CM | POA: Diagnosis not present

## 2020-10-20 DIAGNOSIS — Z79899 Other long term (current) drug therapy: Secondary | ICD-10-CM | POA: Diagnosis not present

## 2020-10-20 DIAGNOSIS — E119 Type 2 diabetes mellitus without complications: Secondary | ICD-10-CM | POA: Diagnosis not present

## 2020-10-20 NOTE — Progress Notes (Signed)
[place] NOTE  Patient Care Team: Pleas [name], NP as PCP - General (Internal Medicine) Festus Aloe, MD as Consulting Physician (Urology) Melida Quitter, MD as Consulting Physician (Otolaryngology)  CHIEF COMPLAINTS/PURPOSE OF CONSULTATION:  [facility] tonsil  ASSESSMENT & PLAN:  No problem-specific Assessment & Plan notes found for this encounter.  No orders of the defined types were placed in this encounter.  This is a 44 yr old male patient with newly diagnosed T3( primary tumor according to TB discussion is borderline 4 cm), N1( multiple lymph nodes ipsilateral but PET pending) and M0/Stage II  referred to medical oncology for treatment consideration We reviewed his imaging, pathology and discussed possible options for treatment in TB today. He followed up with ENT team at [facility] for consideration of surgery.  Given bilateral neck disease, recommendation was to proceed with concurrent chemoradiation. Given young age and excellent PS at baseline with no significant comorbidities, I recommended that he consider cisplatin every 21-day dosing at 100 mg/m for 3 doses concurrent with radiation.  We have discussed about side effects including and not limited to fatigue, nausea, increased risk of infection, ototoxicity with occasional permanent hearing loss, nephrotoxicity which can be permanent in patients etc. He is currently going to start treatment on 10/28/2020. Port and PEG appointments made. He has some difficulty swallowing and anxiety, fear of not being able to breathe with all of this. Encouraged to use ativan or xanax as prescribed for anxiety for now. With regards to difficulty swallowing, encouraged to get the PEG tube. He is willing to proceed with PORT and PEG tube as well He needs weekly appointments, infusions and labs scheduled.  HISTORY OF PRESENTING ILLNESS:   Justin Baldwin 44 y.o. male is here because of new diagnosis of SCC  tonsil/oropharynx.  HISTORY OF PRESENT ILLNESS:  Justin Baldwin is a 44 y.o. male who presented with ongoing head cold symptoms, sinus issues, left ear pain, and left-sided throat pain since September.  When the cold-like symptoms resolved, the throat and ear pain did not. Augmentin was prescribed without relief.  Subsequently, the patient saw Dr. Redmond Baseman, ENT at [facility] ([place] who noted that the left tonsil had a mass appearance on 09/19/2020. 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  Pertinent imaging thus far includes soft tissue neck CT scan on 09/30/2020 revealed a left tonsillar mass abutting the glossotonsillar sulcus and base of the tongue, consistent with known malignancy. Left level 2/3 cystic or necrotic adenopathy reflected nodal metastasis.  CT scan of chest performed on 09/30/2020 revealing a non-specific left lower lobe pulmonary micronodule but no acute intrathoracic abnormality and no definite intrathoracic metastasis.  I have personally reviewed his imaging and discussed him with Dr. Redmond Baseman.    He had a PET scan which showed hypermetabolic left tonsillar mass and bilateral lymphadenopathy consistent with metastatic disease.  Hence recommendation was to proceed with concurrent chemoradiation.  He is here for FU by himself. He is very healthy at baseline. He complains of mild swallowing difficulty, no other complaints. He complains of ongoing anxiety, he attributes some of the anxiety from the nicotine patches.  He apparently has been having strange repetitive dreams and could not sleep very well for the past 4 to 5 days.  Once he took off his nicotine patch and tried a Xanax, he was able to sleep for 5 hours.  He is also worried about not being able to breathe with everything  going on.  He is still able to drink and eat everything.  He is still able to work out.  He is amenable to proceeding with PEG tube insertion as well as port  insertion.   REVIEW OF SYSTEMS:    Constitutional: Denies fevers, chills or abnormal night sweats Eyes: Denies blurriness of vision, double vision or watery eyes Ears, nose, mouth, throat, and face: As mentioned above Respiratory: Denies cough, dyspnea or wheezes Cardiovascular: Denies palpitation, chest discomfort or lower extremity swelling Gastrointestinal:  Denies nausea, heartburn or change in bowel habits Skin: Denies abnormal skin rashes Lymphatics: Denies new lymphadenopathy or easy bruising Neurological:Denies numbness, tingling or new weaknesses Behavioral/Psych: Mood is stable, no new changes  All other systems were reviewed with the patient and are negative.  MEDICAL HISTORY:  Past Medical History:  Diagnosis Date  . Diabetes mellitus without complication ([place])   . Erectile dysfunction   . Insomnia   . Skin abnormality    hidradenitis suppurativa     SURGICAL HISTORY: Past Surgical History:  Procedure Laterality Date  . IRRIGATION AND DEBRIDEMENT ABSCESS N/A 09/22/2018   Procedure: IRRIGATION AND DEBRIDEMENT ABSCESS;  Surgeon: Ralene Ok, MD;  Location: [facility] ORS;  Service: General;  Laterality: N/A;    SOCIAL HISTORY: Social History   Socioeconomic History  . Marital status: Married    Spouse name: Not on file  . Number of children: Not on file  . Years of education: Not on file  . Highest education level: Not on file  Occupational History  . Not on file  Tobacco Use  . Smoking status: Current Every Day Smoker    Packs/day: 1.00    Types: Cigarettes  . Smokeless tobacco: Former Systems developer    Quit date: 02/14/2016  Vaping Use  . Vaping Use: Never used  Substance and Sexual Activity  . Alcohol use: Not Currently    Alcohol/week: 5.0 standard drinks    Types: 5 Shots of liquor per week    Comment: occasinal   . Drug use: No  . Sexual activity: Yes  Other Topics Concern  . Not on file  Social History Narrative   Married.   2 children.   Works as a  Nutritional therapist   Enjoys spending time with family.    Social Determinants of Health   Financial Resource Strain: Low Risk   . Difficulty of Paying Living Expenses: Not hard at all  Food Insecurity: No Food Insecurity  . Worried About Charity fundraiser in the Last Year: Never true  . Ran Out of Food in the Last Year: Never true  Transportation Needs: No Transportation Needs  . Lack of Transportation (Medical): No  . Lack of Transportation (Non-Medical): No  Physical Activity: Not on file  Stress: Not on file  Social Connections: Not on file  Intimate Partner Violence: Not on file    FAMILY HISTORY: Family History  Problem Relation Age of Onset  . Heart attack Father   . Emphysema Paternal Grandmother   . Arthritis Mother     ALLERGIES:  has No Known Allergies.  MEDICATIONS:  Current Outpatient Medications  Medication Sig Dispense Refill  . blood glucose meter kit and supplies KIT Dispense based on patient and insurance preference. Use up to four times daily as directed. (FOR ICD-9 250.00, 250.01). 1 each 0  . dexamethasone (DECADRON) 4 MG tablet Take 2 tablets (8 mg total) by mouth daily. Take daily x 3 days starting the day after cisplatin chemotherapy. Take  with food. 30 tablet 1  . fluticasone (FLONASE) 50 MCG/ACT nasal spray Place 2 sprays into both nostrils daily. 16 g 1  . glucose blood test strip Use three times daily as instructed 100 each 3  . insulin glargine (LANTUS) 100 UNIT/ML injection Inject 0.1 mLs (10 Units total) into the skin daily. 10 mL 2  . lidocaine-prilocaine (EMLA) cream Apply to affected area once 30 g 3  . LORazepam (ATIVAN) 0.5 MG tablet Take 1 tablet (0.5 mg total) by mouth every 6 (six) hours as needed (Nausea or vomiting). 30 tablet 0  . metFORMIN (GLUCOPHAGE XR) 500 MG 24 hr tablet Take 1 tablet (500 mg total) by mouth daily with breakfast. For diabetes. 90 tablet 3  . omeprazole (PRILOSEC) 20 MG capsule TAKE 1 CAPSULE BY MOUTH DAILY FOR  HEARTBURN 90 capsule 3  . ondansetron (ZOFRAN) 8 MG tablet Take 1 tablet (8 mg total) by mouth 2 (two) times daily as needed. Start on the third day after cisplatin chemotherapy. 30 tablet 1  . oxymetazoline (AFRIN) 0.05 % nasal spray Place 1-2 sprays into both nostrils as needed for congestion.     . prochlorperazine (COMPAZINE) 10 MG tablet Take 1 tablet (10 mg total) by mouth every 6 (six) hours as needed (Nausea or vomiting). 30 tablet 1  . rosuvastatin (CRESTOR) 40 MG tablet Take 1 tablet (40 mg total) by mouth every evening. For cholesterol. 90 tablet 3  . tadalafil (CIALIS) 5 MG tablet Take 1 tablet (5 mg total) by mouth daily as needed for erectile dysfunction. 90 tablet 0   No current facility-administered medications for this visit.     PHYSICAL EXAMINATION: ECOG PERFORMANCE STATUS: 0 - Asymptomatic  Vitals:   10/20/20 0852  BP: 133/88  Pulse: 77  Resp: 14  Temp: 97.6 F (36.4 C)  SpO2: 100%   Filed Weights   10/20/20 0852  Weight: 229 lb 4.8 oz (104 kg)    GENERAL:alert, no distress and comfortable SKIN: skin color, texture, turgor are normal, no rashes or significant lesions EYES: normal, conjunctiva are pink and non-injected, sclera clear OROPHARYNX: enlarge left tonsil with ulceration, increasing in size compared to last visit. Neck fullness, but I cant feel a definite lymphadenopathy NECK: supple, thyroid normal size, non-tender, without nodularity LUNGS: clear to auscultation and percussion with normal breathing effort HEART: regular rate & rhythm and no murmurs and no lower extremity edema ABDOMEN:abdomen soft, non-tender and normal bowel sounds Musculoskeletal:no cyanosis of digits and no clubbing  PSYCH: alert & oriented x 3 with fluent speech NEURO: no focal motor/sensory deficits  LABORATORY DATA:  I have reviewed the data as listed Lab Results  Component Value Date   WBC 8.4 10/15/2020   HGB 18.7 (H) 10/15/2020   HCT 55.0 (H) 10/15/2020   MCV 93.1  10/15/2020   PLT 219 10/15/2020     Chemistry      Component Value Date/Time   NA 137 10/15/2020 1132   NA 135 09/12/2019 1048   K 4.0 10/15/2020 1132   CL 99 10/15/2020 1132   CO2 28 10/15/2020 1132   BUN 14 10/15/2020 1132   BUN 18 09/12/2019 1048   CREATININE 1.52 (H) 10/15/2020 1132   CREATININE 1.38 (H) 11/30/2018 1102      Component Value Date/Time   CALCIUM 9.2 10/15/2020 1132   ALKPHOS 43 10/15/2020 1132   AST 35 10/15/2020 1132   ALT 79 (H) 10/15/2020 1132   BILITOT 0.8 10/15/2020 1132  RADIOGRAPHIC STUDIES: I have personally reviewed the radiological images as listed and agreed with the findings in the report. CT SOFT TISSUE NECK W CONTRAST  Addendum Date: 10/16/2020   ADDENDUM REPORT: 10/16/2020 10:26 ADDENDUM: There is a 9 mm left retropharyngeal cystic/necrotic node on axial image 18. Discussed with Dr. Isidore Moos on 10/16/2020. Electronically Signed   By: Macy Mis M.D.   On: 10/16/2020 10:26   Addendum Date: 10/07/2020   ADDENDUM REPORT: 10/07/2020 07:44 ADDENDUM: There is a dictation transcription error in the findings. Craniocaudal measurement should be 3.6 cm rather than 2.6 cm. Electronically Signed   By: Macy Mis M.D.   On: 10/07/2020 07:44   Result Date: 10/16/2020 CLINICAL DATA:  New tonsil cancer EXAM: CT NECK WITH CONTRAST TECHNIQUE: Multidetector CT imaging of the neck was performed using the standard protocol following the bolus administration of intravenous contrast. CONTRAST:  80m ISOVUE-300 IOPAMIDOL (ISOVUE-300) INJECTION 61% COMPARISON:  None. FINDINGS: Pharynx and larynx: There is a peripherally enhancing hypoattenuating lesion within the left palatine tonsil measuring approximately 1.8 x 1.9 x 2.6 cm. This abuts the glossotonsillar sulcus and base of tongue. No significant airway compromise. Otherwise unremarkable. Salivary glands: Parotid and submandibular glands are unremarkable. Thyroid: Normal thyroid. Lymph nodes: Nonenlarged and  enlarged left level 2 and 3 nodes with cystic change or necrosis. Largest at level 2 measures 2.4 cm on axial image 35. Vascular: Major neck vessels are patent. Limited intracranial: No abnormal enhancement in the included posterior fossa. Visualized orbits: Not included. Mastoids and visualized paranasal sinuses: Aerated. Skeleton: Degenerative changes of the cervical spine, greatest at C5-C6 at C6-C7. Upper chest: Dictated separately. Other: None. IMPRESSION: Left tonsillar mass abutting the glossotonsillar sulcus and base of tongue consistent with known malignancy. Left level 2/3 cystic or necrotic adenopathy reflecting nodal metastasis. Electronically Signed: By: PMacy MisM.D. On: 09/30/2020 15:13   CT CHEST W CONTRAST  Result Date: 09/30/2020 CLINICAL DATA:  Tonsillar cancer new diagnosis. No history of surgeries. Smoker. EXAM: CT CHEST WITH CONTRAST TECHNIQUE: Multidetector CT imaging of the chest was performed during intravenous contrast administration. CONTRAST:  722mISOVUE-300 IOPAMIDOL (ISOVUE-300) INJECTION 61% COMPARISON:  None. FINDINGS: Cardiovascular: Normal heart size. No significant pericardial effusion. The thoracic aorta is normal in caliber. No atherosclerotic plaque of the thoracic aorta. No coronary artery calcifications. The pulmonary artery is normal in caliber. No central pulmonary embolus. Mediastinum/Nodes: No enlarged mediastinal, hilar, or axillary lymph nodes. Thyroid gland, trachea, and esophagus demonstrate no significant findings. Lungs/Pleura: Incidentally noted azygos fissure. No focal consolidation. Nonspecific left lower lobe pulmonary micronodule (5:87). No pulmonary mass. No pleural effusion. No pneumothorax. Upper Abdomen: No acute abnormality. Musculoskeletal: No chest wall abnormality No suspicious lytic or blastic osseous lesions. No acute displaced fracture. Multilevel degenerative changes of the spine. IMPRESSION: 1. No acute intrathoracic abnormality and no  definite intrathoracic metastasis in a patient with tonsillar malignancy. 2. Nonspecific left lower lobe pulmonary micronodule. Attention on follow-up. 3. Please see separately dictated CT neck 09/30/2020. Electronically Signed   By: MoIven Finn.D.   On: 09/30/2020 15:21   Final Pathologic Diagnosis    TONSIL, BIOPSY:              Squamous cell carcinoma, HPV associated.  Electronically signed by DaGarnette CzechMD on 09/25/2020 at 3:50 PM    I reviewed the available PET/CT imaging as well as the reports.  All questions were answered. The patient knows to call the clinic with any problems, questions or concerns. I  spent a total of 35 minutes in the care of this patient including history and physical, review of medical records, counseling and coordination of care.    Benay Pike, MD 10/20/2020 8:53 AM

## 2020-10-21 ENCOUNTER — Encounter: Payer: Self-pay | Admitting: Hematology and Oncology

## 2020-10-21 ENCOUNTER — Ambulatory Visit (HOSPITAL_COMMUNITY): Payer: BC Managed Care – PPO

## 2020-10-21 ENCOUNTER — Inpatient Hospital Stay: Payer: BC Managed Care – PPO

## 2020-10-21 ENCOUNTER — Other Ambulatory Visit (HOSPITAL_COMMUNITY): Payer: Self-pay | Admitting: Hematology and Oncology

## 2020-10-21 ENCOUNTER — Telehealth: Payer: Self-pay

## 2020-10-21 ENCOUNTER — Other Ambulatory Visit: Payer: Self-pay

## 2020-10-21 MED ORDER — ZOLPIDEM TARTRATE 5 MG PO TABS: 5.0000 mg | ORAL_TABLET | Freq: Every evening | ORAL | 0 refills | Status: DC | PRN

## 2020-10-21 NOTE — Progress Notes (Signed)
Per cisplatin IVF protocol removed mannitol from fluids due to shortage and separated other ingredients to premix bags

## 2020-10-21 NOTE — Telephone Encounter (Signed)
After receiving the patients MyChart message about not sleeping well I forwarded that message to Dr. Al Pimple. Dr. Al Pimple prescribed 5 mg of Ambien for the patient and sent that to his pharmacy. I called the Pt to notify him of this and advise him to take one tablet 30 minutes before bed. Per Dr. Al Pimple, if 5 mg does not seem to help then the Pt is to take two tablets 30 minutes before bed. The patient verbalized understanding.

## 2020-10-21 NOTE — Progress Notes (Signed)
ambien prescribed for insomnia

## 2020-10-21 NOTE — Progress Notes (Signed)
Pharmacist Chemotherapy Monitoring - Initial Assessment    Anticipated start date: 10/29/19    Regimen:  . Are orders appropriate based on the patient's diagnosis, regimen, and cycle? Yes . Does the plan date match the patient's scheduled date? Yes . Is the sequencing of drugs appropriate? Yes . Are the premedications appropriate for the patient's regimen? Yes . Prior Authorization for treatment is: Approved o If applicable, is the correct biosimilar selected based on the patient's insurance? not applicable  Organ Function and Labs: Marland Kitchen Are dose adjustments needed based on the patient's renal function, hepatic function, or hematologic function? No . Are appropriate labs ordered prior to the start of patient's treatment? Yes . Other organ system assessment, if indicated: cisplatin: baseline audiogram . The following baseline labs, if indicated, have been ordered: cisplatin: K, Mg  Dose Assessment: . Are the drug doses appropriate? Yes . Are the following correct: o Drug concentrations Yes o IV fluid compatible with drug Yes o Administration routes Yes o Timing of therapy Yes . If applicable, does the patient have documented access for treatment and/or plans for port-a-cath placement? yes . If applicable, have lifetime cumulative doses been properly documented and assessed? not applicable Lifetime Dose Tracking  No doses have been documented on this patient for the following tracked chemicals: Doxorubicin, Epirubicin, Idarubicin, Daunorubicin, Mitoxantrone, Bleomycin, Oxaliplatin, Carboplatin, Liposomal Doxorubicin  o   Toxicity Monitoring/Prevention: . The patient has the following take home antiemetics prescribed: Ondansetron, Prochlorperazine and Dexamethasone . The patient has the following take home medications prescribed: N/A . Medication allergies and previous infusion related reactions, if applicable, have been reviewed and addressed. Yes . The patient's current medication list  has been assessed for drug-drug interactions with their chemotherapy regimen. no significant drug-drug interactions were identified on review.  Order Review: . Are the treatment plan orders signed? No . Is the patient scheduled to see a provider prior to their treatment? No  I verify that I have reviewed each item in the above checklist and answered each question accordingly.  Delorise Shiner [facility] 10/21/2020 11:25 AM

## 2020-10-23 ENCOUNTER — Telehealth: Payer: Self-pay | Admitting: Hematology and Oncology

## 2020-10-23 NOTE — Telephone Encounter (Signed)
Scheduled appointments per 12/27 los. Called patient several times, no answer and no voicemail available. Called Ted Mcalpine, patient's wife and emergency contact, again no answer. I did leave a msg on wife's voicemail to call back to confirm next week's appointments. Will also mail updated calendar to patient for January appointments.

## 2020-10-24 ENCOUNTER — Encounter: Payer: Self-pay | Admitting: Hematology and Oncology

## 2020-10-25 ENCOUNTER — Encounter: Payer: Self-pay | Admitting: Hematology and Oncology

## 2020-10-27 ENCOUNTER — Other Ambulatory Visit (HOSPITAL_COMMUNITY)
Admission: RE | Admit: 2020-10-27 | Discharge: 2020-10-27 | Disposition: A | Discharge status: Home / Self Care | Payer: BC Managed Care – PPO | Source: Ambulatory Visit | Attending: Hematology and Oncology | Admitting: Hematology and Oncology

## 2020-10-27 ENCOUNTER — Other Ambulatory Visit: Payer: Self-pay

## 2020-10-27 ENCOUNTER — Inpatient Hospital Stay: Payer: BC Managed Care – PPO

## 2020-10-27 ENCOUNTER — Other Ambulatory Visit: Payer: Self-pay | Admitting: Radiology

## 2020-10-27 DIAGNOSIS — Z01818 Encounter for other preprocedural examination: Secondary | ICD-10-CM | POA: Diagnosis not present

## 2020-10-27 DIAGNOSIS — C099 Malignant neoplasm of tonsil, unspecified: Secondary | ICD-10-CM | POA: Insufficient documentation

## 2020-10-27 DIAGNOSIS — C090 Malignant neoplasm of tonsillar fossa: Secondary | ICD-10-CM

## 2020-10-27 DIAGNOSIS — E860 Dehydration: Secondary | ICD-10-CM | POA: Insufficient documentation

## 2020-10-27 DIAGNOSIS — F419 Anxiety disorder, unspecified: Secondary | ICD-10-CM | POA: Insufficient documentation

## 2020-10-27 DIAGNOSIS — Z5111 Encounter for antineoplastic chemotherapy: Secondary | ICD-10-CM | POA: Insufficient documentation

## 2020-10-27 DIAGNOSIS — Z79899 Other long term (current) drug therapy: Secondary | ICD-10-CM | POA: Insufficient documentation

## 2020-10-27 DIAGNOSIS — Z87891 Personal history of nicotine dependence: Secondary | ICD-10-CM | POA: Insufficient documentation

## 2020-10-27 DIAGNOSIS — U071 COVID-19: Secondary | ICD-10-CM | POA: Insufficient documentation

## 2020-10-27 DIAGNOSIS — R634 Abnormal weight loss: Secondary | ICD-10-CM | POA: Insufficient documentation

## 2020-10-27 DIAGNOSIS — N179 Acute kidney failure, unspecified: Secondary | ICD-10-CM | POA: Insufficient documentation

## 2020-10-27 DIAGNOSIS — E119 Type 2 diabetes mellitus without complications: Secondary | ICD-10-CM | POA: Insufficient documentation

## 2020-10-27 DIAGNOSIS — Z794 Long term (current) use of insulin: Secondary | ICD-10-CM | POA: Insufficient documentation

## 2020-10-27 DIAGNOSIS — K1230 Oral mucositis (ulcerative), unspecified: Secondary | ICD-10-CM | POA: Insufficient documentation

## 2020-10-27 DIAGNOSIS — Z7984 Long term (current) use of oral hypoglycemic drugs: Secondary | ICD-10-CM | POA: Insufficient documentation

## 2020-10-27 DIAGNOSIS — K5900 Constipation, unspecified: Secondary | ICD-10-CM | POA: Insufficient documentation

## 2020-10-27 DIAGNOSIS — A630 Anogenital (venereal) warts: Secondary | ICD-10-CM | POA: Insufficient documentation

## 2020-10-27 DIAGNOSIS — R5383 Other fatigue: Secondary | ICD-10-CM | POA: Insufficient documentation

## 2020-10-27 DIAGNOSIS — R432 Parageusia: Secondary | ICD-10-CM | POA: Insufficient documentation

## 2020-10-27 DIAGNOSIS — F17210 Nicotine dependence, cigarettes, uncomplicated: Secondary | ICD-10-CM | POA: Insufficient documentation

## 2020-10-27 LAB — CBC WITH DIFFERENTIAL/PLATELET
Abs Immature Granulocytes: 0.01 10*3/uL (ref 0.00–0.07)
Basophils Absolute: 0.1 10*3/uL (ref 0.0–0.1)
Basophils Relative: 1 %
Eosinophils Absolute: 0 10*3/uL (ref 0.0–0.5)
Eosinophils Relative: 1 %
HCT: 55.3 % — ABNORMAL HIGH (ref 39.0–52.0)
Hemoglobin: 18.8 g/dL — ABNORMAL HIGH (ref 13.0–17.0)
Immature Granulocytes: 0 %
Lymphocytes Relative: 46 %
Lymphs Abs: 2.1 10*3/uL (ref 0.7–4.0)
MCH: 31.2 pg (ref 26.0–34.0)
MCHC: 34 g/dL (ref 30.0–36.0)
MCV: 91.9 fL (ref 80.0–100.0)
Monocytes Absolute: 0.5 10*3/uL (ref 0.1–1.0)
Monocytes Relative: 11 %
Neutro Abs: 1.8 10*3/uL (ref 1.7–7.7)
Neutrophils Relative %: 41 %
Platelets: 136 10*3/uL — ABNORMAL LOW (ref 150–400)
RBC: 6.02 MIL/uL — ABNORMAL HIGH (ref 4.22–5.81)
RDW: 12.4 % (ref 11.5–15.5)
WBC: 4.4 10*3/uL (ref 4.0–10.5)
nRBC: 0 % (ref 0.0–0.2)

## 2020-10-27 LAB — BASIC METABOLIC PANEL
Anion gap: 8 (ref 5–15)
BUN: 12 mg/dL (ref 6–20)
CO2: 30 mmol/L (ref 22–32)
Calcium: 9.3 mg/dL (ref 8.9–10.3)
Chloride: 96 mmol/L — ABNORMAL LOW (ref 98–111)
Creatinine, Ser: 1.4 mg/dL — ABNORMAL HIGH (ref 0.61–1.24)
GFR, Estimated: >60 mL/min (ref >60)
Glucose, Bld: 279 mg/dL — ABNORMAL HIGH (ref 70–99)
Potassium: 4.9 mmol/L (ref 3.5–5.1)
Sodium: 134 mmol/L — ABNORMAL LOW (ref 135–145)

## 2020-10-27 LAB — SARS CORONAVIRUS 2 (TAT 6-24 HRS): SARS Coronavirus 2: POSITIVE — AB

## 2020-10-27 LAB — MAGNESIUM: Magnesium: 1.6 mg/dL — ABNORMAL LOW (ref 1.7–2.4)

## 2020-10-28 ENCOUNTER — Telehealth: Payer: Self-pay | Admitting: *Deleted

## 2020-10-28 ENCOUNTER — Ambulatory Visit
Admission: RE | Admit: 2020-10-28 | Discharge: 2020-10-28 | Disposition: A | Discharge status: Home / Self Care | Payer: BC Managed Care – PPO | Source: Ambulatory Visit | Attending: Radiation Oncology | Admitting: Radiation Oncology

## 2020-10-28 ENCOUNTER — Other Ambulatory Visit: Payer: Self-pay | Admitting: Infectious Diseases

## 2020-10-28 ENCOUNTER — Inpatient Hospital Stay: Payer: BC Managed Care – PPO

## 2020-10-28 ENCOUNTER — Ambulatory Visit: Payer: BC Managed Care – PPO | Admitting: Hematology and Oncology

## 2020-10-28 ENCOUNTER — Telehealth: Payer: Self-pay | Admitting: Infectious Diseases

## 2020-10-28 ENCOUNTER — Other Ambulatory Visit: Payer: Self-pay

## 2020-10-28 DIAGNOSIS — F17210 Nicotine dependence, cigarettes, uncomplicated: Secondary | ICD-10-CM | POA: Diagnosis not present

## 2020-10-28 DIAGNOSIS — C099 Malignant neoplasm of tonsil, unspecified: Secondary | ICD-10-CM | POA: Diagnosis not present

## 2020-10-28 DIAGNOSIS — U071 COVID-19: Secondary | ICD-10-CM

## 2020-10-28 DIAGNOSIS — C090 Malignant neoplasm of tonsillar fossa: Secondary | ICD-10-CM | POA: Diagnosis not present

## 2020-10-28 NOTE — Progress Notes (Signed)
I connected by phone with Justin Baldwin on 10/28/2020 at 3:02 PM to discuss the potential use of a new treatment for mild to moderate COVID-19 viral infection in non-hospitalized patients.  This patient is a 45 y.o. male that meets the FDA criteria for Emergency Use Authorization of COVID monoclonal antibody casirivimab/imdevimab, bamlanivimab/etesevimab, or sotrovimab.  Has a (+) direct SARS-CoV-2 viral test result  Has mild or moderate COVID-19   Is NOT hospitalized due to COVID-19  Is within 10 days of symptom onset  Has at least one of the high risk factor(s) for progression to severe COVID-19 and/or hospitalization as defined in EUA.  Specific high risk criteria : Diabetes, Immunosuppressive Disease or Treatment and Other high risk medical condition per CDC:  unvaccinated   I have spoken and communicated the following to the patient or parent/caregiver regarding COVID monoclonal antibody treatment:  1. FDA has authorized the emergency use for the treatment of mild to moderate COVID-19 in adults and pediatric patients with positive results of direct SARS-CoV-2 viral testing who are 45 years of age and older weighing at least 40 kg, and who are at high risk for progressing to severe COVID-19 and/or hospitalization.  2. The significant known and potential risks and benefits of COVID monoclonal antibody, and the extent to which such potential risks and benefits are unknown.  3. Information on available alternative treatments and the risks and benefits of those alternatives, including clinical trials.  4. Patients treated with COVID monoclonal antibody should continue to self-isolate and use infection control measures (e.g., wear mask, isolate, social distance, avoid sharing personal items, clean and disinfect "high touch" surfaces, and frequent handwashing) according to CDC guidelines.   5. The patient or parent/caregiver has the option to accept or refuse COVID monoclonal antibody  treatment.  After reviewing this information with the patient, the patient has agreed to receive one of the available covid 19 monoclonal antibodies and will be provided an appropriate fact sheet prior to infusion. Rexene Alberts, NP 10/28/2020 3:02 PM

## 2020-10-28 NOTE — Progress Notes (Signed)
[facility] IR aware of patients positive COVID result.

## 2020-10-28 NOTE — Telephone Encounter (Signed)
Patient arrived today for his infusion appointment.  He tested positive for COVID and then negative for COVID on home test this weekend.  He was tested at [facility] yesterday and was resulted as Positive on 10/28/2019, yet still he arrived for his visit today.  Notified patient that we wouldn't be able to provide his infusion today and our policy is that patients are unable to be seen at [facility] for 21 days past their in house testing positive date which would be 10/28/2019.  He was concerned because he stated he had difficulty breathing at night.  I reiterated numerous time that if he is having breathing difficulties then he needs to go to the emergency room.  If he is admitted the oncology unit and physicians would determine if treatment while inpatient is warranted if that should occur before his 21 day quarantine is up.    Patient questioned his radiation appointments as well.  Need clarification from MD if radiation and chemo need to stay on the same schedule.  Routed to MD and head and neck navigator.

## 2020-10-28 NOTE — Telephone Encounter (Signed)
Called to discuss with patient about COVID-19 symptoms and the use of one of the available treatments for those with mild to moderate Covid symptoms and at a high risk of hospitalization.  Pt appears to qualify for outpatient treatment due to co-morbid conditions and/or a member of an at-risk group in accordance with the FDA Emergency Use Authorization.    Symptom onset: 12/30 Vaccinated: no Qualifiers: diabetes, tonsillar fossa  He will read over things and consider treatment.   Justin Baldwin

## 2020-10-29 ENCOUNTER — Encounter: Payer: Self-pay | Admitting: Hematology and Oncology

## 2020-10-29 ENCOUNTER — Ambulatory Visit
Admission: RE | Admit: 2020-10-29 | Discharge: 2020-10-29 | Disposition: A | Discharge status: Home / Self Care | Payer: BC Managed Care – PPO | Source: Ambulatory Visit | Attending: Radiation Oncology | Admitting: Radiation Oncology

## 2020-10-29 ENCOUNTER — Ambulatory Visit (HOSPITAL_COMMUNITY): Payer: BC Managed Care – PPO

## 2020-10-29 ENCOUNTER — Other Ambulatory Visit (HOSPITAL_COMMUNITY): Payer: BC Managed Care – PPO

## 2020-10-29 ENCOUNTER — Encounter (HOSPITAL_COMMUNITY): Payer: Self-pay | Admitting: Hematology and Oncology

## 2020-10-29 ENCOUNTER — Ambulatory Visit (HOSPITAL_COMMUNITY)
Admission: RE | Admit: 2020-10-29 | Discharge: 2020-10-29 | Disposition: A | Discharge status: Home / Self Care | Payer: BC Managed Care – PPO | Source: Ambulatory Visit | Attending: Pulmonary Disease | Admitting: Pulmonary Disease

## 2020-10-29 DIAGNOSIS — C099 Malignant neoplasm of tonsil, unspecified: Secondary | ICD-10-CM | POA: Diagnosis not present

## 2020-10-29 DIAGNOSIS — C090 Malignant neoplasm of tonsillar fossa: Secondary | ICD-10-CM | POA: Diagnosis not present

## 2020-10-29 DIAGNOSIS — F17210 Nicotine dependence, cigarettes, uncomplicated: Secondary | ICD-10-CM | POA: Diagnosis not present

## 2020-10-29 DIAGNOSIS — U071 COVID-19: Secondary | ICD-10-CM | POA: Insufficient documentation

## 2020-10-29 MED ORDER — METHYLPREDNISOLONE SODIUM SUCC 125 MG IJ SOLR: 125.0000 mg | Freq: Once | INTRAMUSCULAR | Status: DC | PRN

## 2020-10-29 MED ORDER — SOTROVIMAB 500 MG/8ML IV SOLN
500.0000 mg | Freq: Once | INTRAVENOUS | Status: AC
  Administered 2020-10-29: 500 mg via INTRAVENOUS

## 2020-10-29 MED ORDER — ALBUTEROL SULFATE HFA 108 (90 BASE) MCG/ACT IN AERS: 2.0000 | INHALATION_SPRAY | Freq: Once | RESPIRATORY_TRACT | Status: DC | PRN

## 2020-10-29 MED ORDER — FAMOTIDINE IN NACL 20-0.9 MG/50ML-% IV SOLN: 20.0000 mg | Freq: Once | INTRAVENOUS | Status: DC | PRN

## 2020-10-29 MED ORDER — SODIUM CHLORIDE 0.9 % IV SOLN: INTRAVENOUS | Status: DC | PRN

## 2020-10-29 MED ORDER — DIPHENHYDRAMINE HCL 50 MG/ML IJ SOLN: 50.0000 mg | Freq: Once | INTRAMUSCULAR | Status: DC | PRN

## 2020-10-29 MED ORDER — EPINEPHRINE 0.3 MG/0.3ML IJ SOAJ: 0.3000 mg | Freq: Once | INTRAMUSCULAR | Status: DC | PRN

## 2020-10-29 NOTE — Discharge Instructions (Signed)
10 Things You Can Do to Manage Your COVID-19 Symptoms at Home If you have possible or confirmed COVID-19: 1. Stay home from work and school. And stay away from other public places. If you must go out, avoid using any kind of public transportation, ridesharing, or taxis. 2. Monitor your symptoms carefully. If your symptoms get worse, call your healthcare provider immediately. 3. Get rest and stay hydrated. 4. If you have a medical appointment, call the healthcare provider ahead of time and tell them that you have or may have COVID-19. 5. For medical emergencies, call 911 and notify the dispatch personnel that you have or may have COVID-19. 6. Cover your cough and sneezes with a tissue or use the inside of your elbow. 7. Wash your hands often with soap and water for at least 20 seconds or clean your hands with an alcohol-based hand sanitizer that contains at least 60% alcohol. 8. As much as possible, stay in a specific room and away from other people in your home. Also, you should use a separate bathroom, if available. If you need to be around other people in or outside of the home, wear a mask. 9. Avoid sharing personal items with other people in your household, like dishes, towels, and bedding. 10. Clean all surfaces that are touched often, like counters, tabletops, and doorknobs. Use household cleaning sprays or wipes according to the label instructions. cdc.gov/coronavirus 04/25/2019 This information is not intended to replace advice given to you by your health care provider. Make sure you discuss any questions you have with your health care provider. Document Revised: 09/27/2019 Document Reviewed: 09/27/2019 Elsevier Patient Education  2020 Elsevier Inc. What types of side effects do monoclonal antibody drugs cause?  Common side effects  In general, the more common side effects caused by monoclonal antibody drugs include: . Allergic reactions, such as hives or itching . Flu-like signs and  symptoms, including chills, fatigue, fever, and muscle aches and pains . Nausea, vomiting . Diarrhea . Skin rashes . Low blood pressure   The CDC is recommending patients who receive monoclonal antibody treatments wait at least 90 days before being vaccinated.  Currently, there are no data on the safety and efficacy of mRNA COVID-19 vaccines in persons who received monoclonal antibodies or convalescent plasma as part of COVID-19 treatment. Based on the estimated half-life of such therapies as well as evidence suggesting that reinfection is uncommon in the 90 days after initial infection, vaccination should be deferred for at least 90 days, as a precautionary measure until additional information becomes available, to avoid interference of the antibody treatment with vaccine-induced immune responses. If you have any questions or concerns after the infusion please call the Advanced Practice Provider on call at [phone]. This number is ONLY intended for your use regarding questions or concerns about the infusion post-treatment side-effects.  Please do not provide this number to others for use. For return to work notes please contact your primary care provider.   If someone you know is interested in receiving treatment please have them call the COVID hotline at [phone].   

## 2020-10-29 NOTE — Progress Notes (Signed)
Diagnosis: COVID-19  Physician: Dr. Patrick Wright  Procedure: [facility] Med: Sotrovimab infusion - Provided patient with sotrovimab fact sheet for patients, parents, and caregivers prior to infusion.   Complications: No immediate complications noted  Discharge: Discharged home    

## 2020-10-29 NOTE — Telephone Encounter (Signed)
Dr. Al Pimple, If he is still symptomatic after 10 days, we will need to reevaluate his care.  Hopefully the symptoms will subside and he can come for chemo.  I had Baird Lyons call him.  She is with you today now so reach out to her for anything.  Thanks! Shawna Orleans

## 2020-10-29 NOTE — Progress Notes (Signed)
Called patient to discuss plan about chemotherapy No answer, will try tomorrow again.  Thanks

## 2020-10-29 NOTE — Progress Notes (Signed)
Patient reviewed Fact Sheet for Patients, Parents, and Caregivers for Emergency Use Authorization (EUA) of sotrovimab for the Treatment of Coronavirus. Patient also reviewed and is agreeable to the estimated cost of treatment. Patient is agreeable to proceed.   

## 2020-10-30 ENCOUNTER — Ambulatory Visit
Admission: RE | Admit: 2020-10-30 | Discharge: 2020-10-30 | Disposition: A | Discharge status: Home / Self Care | Payer: BC Managed Care – PPO | Source: Ambulatory Visit | Attending: Radiation Oncology | Admitting: Radiation Oncology

## 2020-10-30 ENCOUNTER — Encounter: Payer: Self-pay | Admitting: Hematology and Oncology

## 2020-10-30 ENCOUNTER — Other Ambulatory Visit (HOSPITAL_COMMUNITY): Payer: Self-pay | Admitting: Hematology and Oncology

## 2020-10-30 DIAGNOSIS — F17210 Nicotine dependence, cigarettes, uncomplicated: Secondary | ICD-10-CM | POA: Diagnosis not present

## 2020-10-30 DIAGNOSIS — C090 Malignant neoplasm of tonsillar fossa: Secondary | ICD-10-CM

## 2020-10-30 DIAGNOSIS — C099 Malignant neoplasm of tonsil, unspecified: Secondary | ICD-10-CM | POA: Diagnosis not present

## 2020-10-30 MED ORDER — LORAZEPAM 0.5 MG PO TABS: 0.5000 mg | ORAL_TABLET | Freq: Four times a day (QID) | ORAL | 0 refills | Status: DC | PRN

## 2020-10-30 NOTE — Telephone Encounter (Signed)
Refill request received in error. Patient is not a patient of [facility]. Dr. Al Pimple aware of patient's refill request and comment on refill request which is as follows      "The xanax is too strong for me, I like this one better but it's too weak, is there a middle ground?"

## 2020-10-30 NOTE — Progress Notes (Signed)
Ativan refilled per patient's request.

## 2020-10-31 ENCOUNTER — Ambulatory Visit
Admission: RE | Admit: 2020-10-31 | Discharge: 2020-10-31 | Disposition: A | Discharge status: Home / Self Care | Payer: BC Managed Care – PPO | Source: Ambulatory Visit | Attending: Radiation Oncology | Admitting: Radiation Oncology

## 2020-10-31 ENCOUNTER — Telehealth: Payer: Self-pay

## 2020-10-31 DIAGNOSIS — F17210 Nicotine dependence, cigarettes, uncomplicated: Secondary | ICD-10-CM | POA: Diagnosis not present

## 2020-10-31 DIAGNOSIS — C099 Malignant neoplasm of tonsil, unspecified: Secondary | ICD-10-CM | POA: Diagnosis not present

## 2020-10-31 DIAGNOSIS — C090 Malignant neoplasm of tonsillar fossa: Secondary | ICD-10-CM | POA: Diagnosis not present

## 2020-10-31 NOTE — Telephone Encounter (Signed)
I contacted the Patient to inform him that his port and peg placement appointment has been made for 11/21/2020. I advised the Pt that he will need to report to [facility] radiology at 10 am that morning on 1/28, NPO after 12 am, and that he would need a driver with him that day. I also advised the Pt to go by radiology anytime before the appointment to pick up his Barium and to receive further instructions from their department. The Patient verbalized understanding.

## 2020-11-03 ENCOUNTER — Ambulatory Visit
Admission: RE | Admit: 2020-11-03 | Discharge: 2020-11-03 | Disposition: A | Discharge status: Home / Self Care | Payer: BC Managed Care – PPO | Source: Ambulatory Visit | Attending: Radiation Oncology | Admitting: Radiation Oncology

## 2020-11-03 ENCOUNTER — Telehealth: Payer: Self-pay | Admitting: Hematology and Oncology

## 2020-11-03 ENCOUNTER — Other Ambulatory Visit: Payer: Self-pay | Admitting: Radiation Oncology

## 2020-11-03 DIAGNOSIS — C099 Malignant neoplasm of tonsil, unspecified: Secondary | ICD-10-CM | POA: Diagnosis not present

## 2020-11-03 DIAGNOSIS — F419 Anxiety disorder, unspecified: Secondary | ICD-10-CM | POA: Diagnosis not present

## 2020-11-03 DIAGNOSIS — F17210 Nicotine dependence, cigarettes, uncomplicated: Secondary | ICD-10-CM | POA: Diagnosis not present

## 2020-11-03 DIAGNOSIS — C090 Malignant neoplasm of tonsillar fossa: Secondary | ICD-10-CM | POA: Diagnosis not present

## 2020-11-03 MED ORDER — LIDOCAINE VISCOUS HCL 2 % MT SOLN: OROMUCOSAL | 4 refills | Status: DC

## 2020-11-03 NOTE — Telephone Encounter (Signed)
Called pt to confirm appts per 12/27 los- pt is aware - my chart active.

## 2020-11-04 ENCOUNTER — Encounter: Payer: BC Managed Care – PPO | Admitting: Nutrition

## 2020-11-04 ENCOUNTER — Other Ambulatory Visit (HOSPITAL_COMMUNITY): Payer: Self-pay | Admitting: Hematology and Oncology

## 2020-11-04 ENCOUNTER — Ambulatory Visit
Admission: RE | Admit: 2020-11-04 | Discharge: 2020-11-04 | Disposition: A | Discharge status: Home / Self Care | Payer: BC Managed Care – PPO | Source: Ambulatory Visit | Attending: Radiation Oncology | Admitting: Radiation Oncology

## 2020-11-04 ENCOUNTER — Ambulatory Visit: Payer: BC Managed Care – PPO | Admitting: Hematology and Oncology

## 2020-11-04 ENCOUNTER — Ambulatory Visit: Payer: BC Managed Care – PPO

## 2020-11-04 ENCOUNTER — Other Ambulatory Visit: Payer: BC Managed Care – PPO

## 2020-11-04 DIAGNOSIS — C099 Malignant neoplasm of tonsil, unspecified: Secondary | ICD-10-CM | POA: Diagnosis not present

## 2020-11-04 DIAGNOSIS — C090 Malignant neoplasm of tonsillar fossa: Secondary | ICD-10-CM

## 2020-11-04 DIAGNOSIS — F17210 Nicotine dependence, cigarettes, uncomplicated: Secondary | ICD-10-CM | POA: Diagnosis not present

## 2020-11-04 MED ORDER — HYDROCODONE-ACETAMINOPHEN 7.5-325 MG/15ML PO SOLN: 10.0000 mL | Freq: Four times a day (QID) | ORAL | 0 refills | Status: DC | PRN

## 2020-11-04 MED ORDER — SONAFINE EX EMUL
1.0000 "application " | Freq: Two times a day (BID) | CUTANEOUS | Status: DC
  Administered 2020-11-04: 1 via TOPICAL

## 2020-11-04 NOTE — Progress Notes (Signed)
Sent prescription for liquid hydrocodone for pain control, pt complains of severe mucositis.  Justin Baldwin

## 2020-11-04 NOTE — Progress Notes (Signed)

## 2020-11-05 ENCOUNTER — Ambulatory Visit
Admission: RE | Admit: 2020-11-05 | Discharge: 2020-11-05 | Disposition: A | Discharge status: Home / Self Care | Payer: BC Managed Care – PPO | Source: Ambulatory Visit | Attending: Radiation Oncology | Admitting: Radiation Oncology

## 2020-11-05 DIAGNOSIS — F17210 Nicotine dependence, cigarettes, uncomplicated: Secondary | ICD-10-CM | POA: Diagnosis not present

## 2020-11-05 DIAGNOSIS — C090 Malignant neoplasm of tonsillar fossa: Secondary | ICD-10-CM | POA: Diagnosis not present

## 2020-11-05 DIAGNOSIS — C099 Malignant neoplasm of tonsil, unspecified: Secondary | ICD-10-CM | POA: Diagnosis not present

## 2020-11-06 ENCOUNTER — Inpatient Hospital Stay: Payer: BC Managed Care – PPO | Admitting: Nutrition

## 2020-11-06 ENCOUNTER — Other Ambulatory Visit: Payer: Self-pay

## 2020-11-06 ENCOUNTER — Encounter: Payer: Self-pay | Admitting: Hematology and Oncology

## 2020-11-06 ENCOUNTER — Ambulatory Visit
Admission: RE | Admit: 2020-11-06 | Discharge: 2020-11-06 | Disposition: A | Discharge status: Home / Self Care | Payer: BC Managed Care – PPO | Source: Ambulatory Visit | Attending: Radiation Oncology | Admitting: Radiation Oncology

## 2020-11-06 ENCOUNTER — Inpatient Hospital Stay (HOSPITAL_BASED_OUTPATIENT_CLINIC_OR_DEPARTMENT_OTHER): Payer: BC Managed Care – PPO | Admitting: Hematology and Oncology

## 2020-11-06 ENCOUNTER — Telehealth: Payer: Self-pay | Admitting: Hematology and Oncology

## 2020-11-06 ENCOUNTER — Inpatient Hospital Stay: Payer: BC Managed Care – PPO

## 2020-11-06 VITALS — BP 135/67 | HR 83 | Temp 98.7°F | Resp 20 | Ht 72.0 in | Wt 221.3 lb

## 2020-11-06 DIAGNOSIS — K1230 Oral mucositis (ulcerative), unspecified: Secondary | ICD-10-CM | POA: Diagnosis not present

## 2020-11-06 DIAGNOSIS — R634 Abnormal weight loss: Secondary | ICD-10-CM | POA: Diagnosis not present

## 2020-11-06 DIAGNOSIS — N179 Acute kidney failure, unspecified: Secondary | ICD-10-CM | POA: Diagnosis not present

## 2020-11-06 DIAGNOSIS — E119 Type 2 diabetes mellitus without complications: Secondary | ICD-10-CM | POA: Diagnosis not present

## 2020-11-06 DIAGNOSIS — C090 Malignant neoplasm of tonsillar fossa: Secondary | ICD-10-CM

## 2020-11-06 DIAGNOSIS — A630 Anogenital (venereal) warts: Secondary | ICD-10-CM | POA: Diagnosis not present

## 2020-11-06 DIAGNOSIS — F419 Anxiety disorder, unspecified: Secondary | ICD-10-CM | POA: Diagnosis not present

## 2020-11-06 DIAGNOSIS — C099 Malignant neoplasm of tonsil, unspecified: Secondary | ICD-10-CM | POA: Diagnosis not present

## 2020-11-06 DIAGNOSIS — K5900 Constipation, unspecified: Secondary | ICD-10-CM | POA: Diagnosis not present

## 2020-11-06 DIAGNOSIS — Z5111 Encounter for antineoplastic chemotherapy: Secondary | ICD-10-CM | POA: Diagnosis not present

## 2020-11-06 DIAGNOSIS — Z7984 Long term (current) use of oral hypoglycemic drugs: Secondary | ICD-10-CM | POA: Diagnosis not present

## 2020-11-06 DIAGNOSIS — E860 Dehydration: Secondary | ICD-10-CM | POA: Diagnosis not present

## 2020-11-06 DIAGNOSIS — R432 Parageusia: Secondary | ICD-10-CM | POA: Diagnosis not present

## 2020-11-06 DIAGNOSIS — Z794 Long term (current) use of insulin: Secondary | ICD-10-CM | POA: Diagnosis not present

## 2020-11-06 DIAGNOSIS — F17210 Nicotine dependence, cigarettes, uncomplicated: Secondary | ICD-10-CM | POA: Diagnosis not present

## 2020-11-06 DIAGNOSIS — R5383 Other fatigue: Secondary | ICD-10-CM | POA: Diagnosis not present

## 2020-11-06 LAB — CBC WITH DIFFERENTIAL/PLATELET
Abs Immature Granulocytes: 0.02 10*3/uL (ref 0.00–0.07)
Basophils Absolute: 0.1 10*3/uL (ref 0.0–0.1)
Basophils Relative: 1 %
Eosinophils Absolute: 0 10*3/uL (ref 0.0–0.5)
Eosinophils Relative: 0 %
HCT: 52 % (ref 39.0–52.0)
Hemoglobin: 17.8 g/dL — ABNORMAL HIGH (ref 13.0–17.0)
Immature Granulocytes: 0 %
Lymphocytes Relative: 15 %
Lymphs Abs: 1.3 10*3/uL (ref 0.7–4.0)
MCH: 31 pg (ref 26.0–34.0)
MCHC: 34.2 g/dL (ref 30.0–36.0)
MCV: 90.6 fL (ref 80.0–100.0)
Monocytes Absolute: 0.7 10*3/uL (ref 0.1–1.0)
Monocytes Relative: 8 %
Neutro Abs: 6.4 10*3/uL (ref 1.7–7.7)
Neutrophils Relative %: 76 %
Platelets: 248 10*3/uL (ref 150–400)
RBC: 5.74 MIL/uL (ref 4.22–5.81)
RDW: 12.2 % (ref 11.5–15.5)
WBC: 8.5 10*3/uL (ref 4.0–10.5)
nRBC: 0 % (ref 0.0–0.2)

## 2020-11-06 LAB — BASIC METABOLIC PANEL
Anion gap: 7 (ref 5–15)
BUN: 14 mg/dL (ref 6–20)
CO2: 31 mmol/L (ref 22–32)
Calcium: 9.2 mg/dL (ref 8.9–10.3)
Chloride: 100 mmol/L (ref 98–111)
Creatinine, Ser: 1.43 mg/dL — ABNORMAL HIGH (ref 0.61–1.24)
GFR, Estimated: >60 mL/min (ref >60)
Glucose, Bld: 127 mg/dL — ABNORMAL HIGH (ref 70–99)
Potassium: 4.7 mmol/L (ref 3.5–5.1)
Sodium: 138 mmol/L (ref 135–145)

## 2020-11-06 LAB — MAGNESIUM: Magnesium: 1.6 mg/dL — ABNORMAL LOW (ref 1.7–2.4)

## 2020-11-06 NOTE — Telephone Encounter (Signed)
Scheduled appointments and cancelled appointments per 1/13 los and MD msg. Patient requested not to receive a phone call, keeps up with MyChart and will see all updates in there.

## 2020-11-06 NOTE — Progress Notes (Signed)
[facility] NOTE  Patient Care Team: Pleas [name], NP as PCP - General (Internal Medicine) Festus Aloe, MD as Consulting Physician (Urology) Melida Quitter, MD as Consulting Physician (Otolaryngology)  CHIEF COMPLAINTS/PURPOSE OF CONSULTATION:  [facility] tonsil  ASSESSMENT & PLAN:  No problem-specific Assessment & Plan notes found for this encounter.  No orders of the defined types were placed in this encounter.  This is a 45 yr old male patient with newly diagnosed T3( primary tumor according to TB discussion is borderline 4 cm), N1( multiple lymph nodes ipsilateral but PET pending) and M0/Stage II  referred to medical oncology for treatment consideration We reviewed his imaging, pathology and discussed possible options for treatment in TB today. He followed up with ENT team at [facility] for consideration of surgery.   Given bilateral neck disease, recommendation was to proceed with concurrent chemoradiation. Given young age and excellent PS at baseline with no significant comorbidities, I recommended that he consider cisplatin every 21-day dosing at 100 mg/m for 3 doses concurrent with radiation.  We have discussed about side effects including and not limited to fatigue, nausea, increased risk of infection, ototoxicity with occasional permanent hearing loss, nephrotoxicity which can be permanent in patients etc. He was supposed to start treatment on 10/28/2020, however delayed due to active COVID infection. Now scheduled to start 11/07/2020. He has started radiation as planned on 10/28/2020, improvement in tonsilar mass noted on exam today Labs reviewed, Mg will be replaced, ok to proceed with treatment tomorrow.  2. Treatment induced mucositis, prescribed viscous lidocaine and hycet. He will use lidocaine mostly and if still uncontrolled Hycet  3.  Constipation, encouraged Docusate daily and hydration  4. Anxiety, prescribed ativan which can be used both for  CINV and nausea.  HISTORY OF PRESENTING ILLNESS:   Justin Baldwin 45 y.o. male is here because of new diagnosis of SCC tonsil/oropharynx.  HISTORY OF PRESENT ILLNESS:  Justin Baldwin is a 45 y.o. male who presented with ongoing head cold symptoms, sinus issues, left ear pain, and left-sided throat pain since September.  When the cold-like symptoms resolved, the throat and ear pain did not. Augmentin was prescribed without relief.  Subsequently, the patient saw Dr. Redmond Baseman, ENT at [facility], who noted that the left tonsil had a mass appearance on 09/19/2020. 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  Pertinent imaging thus far includes soft tissue neck CT scan on 09/30/2020 revealed a left tonsillar mass abutting the glossotonsillar sulcus and base of the tongue, consistent with known malignancy. Left level 2/3 cystic or necrotic adenopathy reflected nodal metastasis.  CT scan of chest performed on 09/30/2020 revealing a non-specific left lower lobe pulmonary micronodule but no acute intrathoracic abnormality and no definite intrathoracic metastasis.  I have personally reviewed his imaging and discussed him with Dr. Redmond Baseman.    He had a PET scan which showed hypermetabolic left tonsillar mass and bilateral lymphadenopathy consistent with metastatic disease.  Hence recommendation was to proceed with concurrent chemoradiation.  He was supposed to start chemo on 1.4.2022 but this was delayed due to active COVID 19 infection. He denies any residual symptoms, headaches, bodyaches and fever resolved. He now has sore throat which is very bothersome in the morning, rates it at 7/10. Lidocaine has been helping He is still able to swallow everything but has to push it down with water. He denies any changes in breathing. Mild constipation. No changes in urination  Rest of the pertinent 10 point ROS reviewed and negative  MEDICAL HISTORY:  Past Medical History:   Diagnosis Date  . Diabetes mellitus without complication ([place])   . Erectile dysfunction   . Insomnia   . Skin abnormality    hidradenitis suppurativa     SURGICAL HISTORY: Past Surgical History:  Procedure Laterality Date  . IRRIGATION AND DEBRIDEMENT ABSCESS N/A 09/22/2018   Procedure: IRRIGATION AND DEBRIDEMENT ABSCESS;  Surgeon: Ralene Ok, MD;  Location: [facility] ORS;  Service: General;  Laterality: N/A;    SOCIAL HISTORY: Social History   Socioeconomic History  . Marital status: Married    Spouse name: Not on file  . Number of children: Not on file  . Years of education: Not on file  . Highest education level: Not on file  Occupational History  . Not on file  Tobacco Use  . Smoking status: Current Every Day Smoker    Packs/day: 1.00    Types: Cigarettes  . Smokeless tobacco: Former Systems developer    Quit date: 02/14/2016  Vaping Use  . Vaping Use: Never used  Substance and Sexual Activity  . Alcohol use: Not Currently    Alcohol/week: 5.0 standard drinks    Types: 5 Shots of liquor per week    Comment: occasinal   . Drug use: No  . Sexual activity: Yes  Other Topics Concern  . Not on file  Social History Narrative   ** Merged History Encounter **       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    Social Determinants of Health   Financial Resource Strain: Low Risk   . Difficulty of Paying Living Expenses: Not hard at all  Food Insecurity: No Food Insecurity  . Worried About Charity fundraiser in the Last Year: Never true  . Ran Out of Food in the Last Year: Never true  Transportation Needs: No Transportation Needs  . Lack of Transportation (Medical): No  . Lack of Transportation (Non-Medical): No  Physical Activity: Not on file  Stress: Not on file  Social Connections: Not on file  Intimate Partner Violence: Not on file    FAMILY HISTORY: Family History  Problem Relation Age of Onset  . Heart attack Father   .  Emphysema Paternal Grandmother   . Arthritis Mother     ALLERGIES:  has No Known Allergies.  MEDICATIONS:  Current Outpatient Medications  Medication Sig Dispense Refill  . blood glucose meter kit and supplies KIT Dispense based on patient and insurance preference. Use up to four times daily as directed. (FOR ICD-9 250.00, 250.01). 1 each 0  . dexamethasone (DECADRON) 4 MG tablet Take 2 tablets (8 mg total) by mouth daily. Take daily x 3 days starting the day after cisplatin chemotherapy. Take with food. 30 tablet 1  . fluticasone (FLONASE) 50 MCG/ACT nasal spray Place 2 sprays into both nostrils daily. 16 g 1  . glucose blood test strip Use three times daily as instructed 100 each 3  . HYDROcodone-acetaminophen (HYCET) 7.5-325 mg/15 ml solution Take 10 mLs by mouth every 6 (six) hours as needed for moderate pain. 120 mL 0  . insulin glargine (LANTUS) 100 UNIT/ML injection Inject 0.1 mLs (10 Units total) into the skin daily. 10 mL 2  . lidocaine (XYLOCAINE) 2 % solution Patient: Mix 1part 2% viscous lidocaine, 1part H20. Swish & swallow 49m of diluted mixture, 359m before meals and at bedtime, up to QID 200 mL  4  . lidocaine-prilocaine (EMLA) cream Apply to affected area once 30 g 3  . LORazepam (ATIVAN) 0.5 MG tablet Take 1 tablet (0.5 mg total) by mouth every 6 (six) hours as needed for anxiety (Nausea or vomiting, take 1-2 tabs as needed every 6 hrs for anxiety, nausea or vomiting). 45 tablet 0  . metFORMIN (GLUCOPHAGE XR) 500 MG 24 hr tablet Take 1 tablet (500 mg total) by mouth daily with breakfast. For diabetes. 90 tablet 3  . omeprazole (PRILOSEC) 20 MG capsule TAKE 1 CAPSULE BY MOUTH DAILY FOR HEARTBURN 90 capsule 3  . ondansetron (ZOFRAN) 8 MG tablet Take 1 tablet (8 mg total) by mouth 2 (two) times daily as needed. Start on the third day after cisplatin chemotherapy. 30 tablet 1  . oxymetazoline (AFRIN) 0.05 % nasal spray Place 1-2 sprays into both nostrils as needed for congestion.      . prochlorperazine (COMPAZINE) 10 MG tablet Take 1 tablet (10 mg total) by mouth every 6 (six) hours as needed (Nausea or vomiting). 30 tablet 1  . rosuvastatin (CRESTOR) 40 MG tablet Take 1 tablet (40 mg total) by mouth every evening. For cholesterol. 90 tablet 3  . tadalafil (CIALIS) 5 MG tablet Take 1 tablet (5 mg total) by mouth daily as needed for erectile dysfunction. 90 tablet 0  . zolpidem (AMBIEN) 5 MG tablet Take 1 tablet (5 mg total) by mouth at bedtime as needed for sleep. 30 tablet 0   No current facility-administered medications for this visit.     PHYSICAL EXAMINATION: ECOG PERFORMANCE STATUS: 0 - Asymptomatic  Vitals:   11/06/20 1446  BP: 135/67  Pulse: 83  Resp: 20  Temp: 98.7 F (37.1 C)  SpO2: 94%   Filed Weights   11/06/20 1446  Weight: 221 lb 4.8 oz (100.4 kg)    GENERAL:alert, no distress and comfortable SKIN: skin color, texture, turgor are normal, no rashes or significant lesions EYES: normal, conjunctiva are pink and non-injected, sclera clear OROPHARYNX: left tonsillar mass has improved. NECK: supple, thyroid normal size, non-tender, without nodularity LYMPH: NO palpable LN LUNGS: clear to auscultation and percussion with normal breathing effort HEART: regular rate & rhythm and no murmurs and no lower extremity edema ABDOMEN:abdomen soft, non-tender and normal bowel sounds Musculoskeletal:no cyanosis of digits and no clubbing  PSYCH: alert & oriented x 3 with fluent speech NEURO: no focal motor/sensory deficits  LABORATORY DATA:  I have reviewed the data as listed Lab Results  Component Value Date   WBC 8.5 11/06/2020   HGB 17.8 (H) 11/06/2020   HCT 52.0 11/06/2020   MCV 90.6 11/06/2020   PLT 248 11/06/2020     Chemistry      Component Value Date/Time   NA 134 (L) 10/27/2020 1015   NA 135 09/12/2019 1048   K 4.9 10/27/2020 1015   CL 96 (L) 10/27/2020 1015   CO2 30 10/27/2020 1015   BUN 12 10/27/2020 1015   BUN 18 09/12/2019 1048    CREATININE 1.40 (H) 10/27/2020 1015   CREATININE 1.38 (H) 11/30/2018 1102      Component Value Date/Time   CALCIUM 9.3 10/27/2020 1015   ALKPHOS 43 10/15/2020 1132   AST 35 10/15/2020 1132   ALT 79 (H) 10/15/2020 1132   BILITOT 0.8 10/15/2020 1132       RADIOGRAPHIC STUDIES: I have personally reviewed the radiological images as listed and agreed with the findings in the report. No results found. Final Pathologic Diagnosis    TONSIL,  BIOPSY:              Squamous cell carcinoma, HPV associated.  Electronically signed by Garnette Czech, MD on 09/25/2020 at 3:50 PM    I reviewed the available PET/CT imaging as well as the reports.  All questions were answered. The patient knows to call the clinic with any problems, questions or concerns. I spent a total of 30 minutes in the care of this patient including history and physical, review of medical records, counseling and coordination of care.    Benay Pike, MD 11/06/2020 3:04 PM

## 2020-11-06 NOTE — Progress Notes (Signed)
Per Dr. Chryl Heck - okay to treat patient with magnesium level of 1.6.

## 2020-11-06 NOTE — Progress Notes (Signed)
Pharmacist Chemotherapy Monitoring - Initial Assessment    Anticipated start date: 11/13/20  Regimen:  . Are orders appropriate based on the patient's diagnosis, regimen, and cycle? Yes . Does the plan date match the patient's scheduled date? Yes . Is the sequencing of drugs appropriate? Yes . Are the premedications appropriate for the patient's regimen? Yes . Prior Authorization for treatment is: Approved o If applicable, is the correct biosimilar selected based on the patient's insurance? not applicable  Organ Function and Labs: Marland Kitchen Are dose adjustments needed based on the patient's renal function, hepatic function, or hematologic function? Yes . Are appropriate labs ordered prior to the start of patient's treatment? Yes . Other organ system assessment, if indicated: N/A . The following baseline labs, if indicated, have been ordered: N/A  Dose Assessment: . Are the drug doses appropriate? Yes . Are the following correct: o Drug concentrations Yes o IV fluid compatible with drug Yes o Administration routes Yes o Timing of therapy Yes . If applicable, does the patient have documented access for treatment and/or plans for port-a-cath placement? yes . If applicable, have lifetime cumulative doses been properly documented and assessed? not applicable Lifetime Dose Tracking  No doses have been documented on this patient for the following tracked chemicals: Doxorubicin, Epirubicin, Idarubicin, Daunorubicin, Mitoxantrone, Bleomycin, Oxaliplatin, Carboplatin, Liposomal Doxorubicin  o   Toxicity Monitoring/Prevention: . The patient has the following take home antiemetics prescribed: Prochlorperazine . The patient has the following take home medications prescribed: N/A . Medication allergies and previous infusion related reactions, if applicable, have been reviewed and addressed. Yes . The patient's current medication list has been assessed for drug-drug interactions with their chemotherapy  regimen. no significant drug-drug interactions were identified on review.  Order Review: . Are the treatment plan orders signed?no . Is the patient scheduled to see a provider prior to their treatment? Yes  I verify that I have reviewed each item in the above checklist and answered each question accordingly.  Philomena Course 11/06/2020 12:12 PM

## 2020-11-06 NOTE — Progress Notes (Deleted)
Per Dr. Chryl Heck - okay to proceed with treatment with magnesium level of 1.6.

## 2020-11-07 ENCOUNTER — Inpatient Hospital Stay: Payer: BC Managed Care – PPO | Admitting: Nutrition

## 2020-11-07 ENCOUNTER — Ambulatory Visit
Admission: RE | Admit: 2020-11-07 | Discharge: 2020-11-07 | Disposition: A | Discharge status: Home / Self Care | Payer: BC Managed Care – PPO | Source: Ambulatory Visit | Attending: Radiation Oncology | Admitting: Radiation Oncology

## 2020-11-07 ENCOUNTER — Other Ambulatory Visit: Payer: Self-pay

## 2020-11-07 ENCOUNTER — Other Ambulatory Visit: Payer: Self-pay | Admitting: Hematology and Oncology

## 2020-11-07 ENCOUNTER — Other Ambulatory Visit: Payer: Self-pay | Admitting: Medical

## 2020-11-07 ENCOUNTER — Inpatient Hospital Stay: Payer: BC Managed Care – PPO

## 2020-11-07 VITALS — BP 128/79 | HR 74 | Temp 98.3°F | Resp 16

## 2020-11-07 DIAGNOSIS — N179 Acute kidney failure, unspecified: Secondary | ICD-10-CM | POA: Diagnosis not present

## 2020-11-07 DIAGNOSIS — C090 Malignant neoplasm of tonsillar fossa: Secondary | ICD-10-CM

## 2020-11-07 DIAGNOSIS — C099 Malignant neoplasm of tonsil, unspecified: Secondary | ICD-10-CM | POA: Diagnosis not present

## 2020-11-07 DIAGNOSIS — R432 Parageusia: Secondary | ICD-10-CM | POA: Diagnosis not present

## 2020-11-07 DIAGNOSIS — E860 Dehydration: Secondary | ICD-10-CM | POA: Diagnosis not present

## 2020-11-07 DIAGNOSIS — K5900 Constipation, unspecified: Secondary | ICD-10-CM | POA: Diagnosis not present

## 2020-11-07 DIAGNOSIS — R634 Abnormal weight loss: Secondary | ICD-10-CM | POA: Diagnosis not present

## 2020-11-07 DIAGNOSIS — Z794 Long term (current) use of insulin: Secondary | ICD-10-CM | POA: Diagnosis not present

## 2020-11-07 DIAGNOSIS — G893 Neoplasm related pain (acute) (chronic): Secondary | ICD-10-CM

## 2020-11-07 DIAGNOSIS — F419 Anxiety disorder, unspecified: Secondary | ICD-10-CM | POA: Diagnosis not present

## 2020-11-07 DIAGNOSIS — Z5111 Encounter for antineoplastic chemotherapy: Secondary | ICD-10-CM | POA: Diagnosis not present

## 2020-11-07 DIAGNOSIS — F17210 Nicotine dependence, cigarettes, uncomplicated: Secondary | ICD-10-CM | POA: Diagnosis not present

## 2020-11-07 DIAGNOSIS — Z7984 Long term (current) use of oral hypoglycemic drugs: Secondary | ICD-10-CM | POA: Diagnosis not present

## 2020-11-07 DIAGNOSIS — E119 Type 2 diabetes mellitus without complications: Secondary | ICD-10-CM | POA: Diagnosis not present

## 2020-11-07 DIAGNOSIS — R5383 Other fatigue: Secondary | ICD-10-CM | POA: Diagnosis not present

## 2020-11-07 DIAGNOSIS — A630 Anogenital (venereal) warts: Secondary | ICD-10-CM | POA: Diagnosis not present

## 2020-11-07 DIAGNOSIS — K1230 Oral mucositis (ulcerative), unspecified: Secondary | ICD-10-CM | POA: Diagnosis not present

## 2020-11-07 MED ORDER — MAGNESIUM SULFATE 2 GM/50ML IV SOLN
2.0000 g | Freq: Once | INTRAVENOUS | Status: AC
  Administered 2020-11-07: 2 g via INTRAVENOUS

## 2020-11-07 MED ORDER — PALONOSETRON HCL INJECTION 0.25 MG/5ML
INTRAVENOUS | Status: AC
  Filled 2020-11-07: qty 5

## 2020-11-07 MED ORDER — SODIUM CHLORIDE 0.9 % IV SOLN
10.0000 mg | Freq: Once | INTRAVENOUS | Status: AC
  Administered 2020-11-07: 10 mg via INTRAVENOUS
  Filled 2020-11-07: qty 10

## 2020-11-07 MED ORDER — PALONOSETRON HCL INJECTION 0.25 MG/5ML
0.2500 mg | Freq: Once | INTRAVENOUS | Status: AC
  Administered 2020-11-07: 0.25 mg via INTRAVENOUS

## 2020-11-07 MED ORDER — HYDROCODONE-ACETAMINOPHEN 5-325 MG PO TABS: 2.0000 | ORAL_TABLET | Freq: Once | ORAL | Status: DC

## 2020-11-07 MED ORDER — HYDROCODONE-ACETAMINOPHEN 5-325 MG PO TABS
ORAL_TABLET | ORAL | Status: AC
  Filled 2020-11-07: qty 2

## 2020-11-07 MED ORDER — MAGNESIUM SULFATE 2 GM/50ML IV SOLN
INTRAVENOUS | Status: AC
  Filled 2020-11-07: qty 50

## 2020-11-07 MED ORDER — POTASSIUM CHLORIDE IN NACL 20-0.9 MEQ/L-% IV SOLN
Freq: Once | INTRAVENOUS | Status: AC
  Filled 2020-11-07: qty 1000

## 2020-11-07 MED ORDER — SODIUM CHLORIDE 0.9 % IV SOLN
100.0000 mg/m2 | Freq: Once | INTRAVENOUS | Status: AC
  Administered 2020-11-07: 230 mg via INTRAVENOUS
  Filled 2020-11-07: qty 230

## 2020-11-07 MED ORDER — SODIUM CHLORIDE 0.9 % IV SOLN
150.0000 mg | Freq: Once | INTRAVENOUS | Status: AC
  Administered 2020-11-07: 150 mg via INTRAVENOUS
  Filled 2020-11-07: qty 150

## 2020-11-07 MED ORDER — SODIUM CHLORIDE 0.9 % IV SOLN
Freq: Once | INTRAVENOUS | Status: AC
  Filled 2020-11-07: qty 250

## 2020-11-07 MED ORDER — LORAZEPAM 0.5 MG PO TABS: 0.5000 mg | ORAL_TABLET | Freq: Three times a day (TID) | ORAL | 0 refills | Status: DC | PRN

## 2020-11-07 MED ORDER — HYDROCODONE-ACETAMINOPHEN 5-325 MG PO TABS
2.0000 | ORAL_TABLET | Freq: Once | ORAL | Status: AC
Start: 2020-11-07 — End: 2020-11-07
  Administered 2020-11-07: 2 via ORAL

## 2020-11-07 NOTE — Patient Instructions (Signed)
[place] Discharge Instructions for Patients Receiving Chemotherapy  Today you received the following chemotherapy agent: Cisplatin  To help prevent nausea and vomiting after your treatment, we encourage you to take your nausea medication as directed by your MD.   If you develop nausea and vomiting that is not controlled by your nausea medication, call the clinic.   BELOW ARE SYMPTOMS THAT SHOULD BE REPORTED IMMEDIATELY:  *FEVER GREATER THAN 100.5 F  *CHILLS WITH OR WITHOUT FEVER  NAUSEA AND VOMITING THAT IS NOT CONTROLLED WITH YOUR NAUSEA MEDICATION  *UNUSUAL SHORTNESS OF BREATH  *UNUSUAL BRUISING OR BLEEDING  TENDERNESS IN MOUTH AND THROAT WITH OR WITHOUT PRESENCE OF ULCERS  *URINARY PROBLEMS  *BOWEL PROBLEMS  UNUSUAL RASH Items with * indicate a potential emergency and should be followed up as soon as possible.  Feel free to call the clinic should you have any questions or concerns. The clinic phone number is [phone].  Please show the [facility] at check-in to the Emergency Department and triage nurse.  Cisplatin injection What is this medicine? CISPLATIN (SIS pla tin) is a chemotherapy drug. It targets fast dividing cells, like cancer cells, and causes these cells to die. This medicine is used to treat many types of cancer like bladder, ovarian, and testicular cancers. This medicine may be used for other purposes; ask your health care provider or pharmacist if you have questions. COMMON BRAND NAME(S): Platinol, Platinol -AQ What should I tell my health care provider before I take this medicine? They need to know if you have any of these conditions:  eye disease, vision problems  hearing problems  kidney disease  low blood counts, like white cells, platelets, or red blood cells  tingling of the fingers or toes, or other nerve disorder  an unusual or allergic reaction to cisplatin, carboplatin, oxaliplatin, other medicines, foods,  dyes, or preservatives  pregnant or trying to get pregnant  breast-feeding How should I use this medicine? This drug is given as an infusion into a vein. It is administered in a hospital or clinic by a specially trained health care professional. Talk to your pediatrician regarding the use of this medicine in children. Special care may be needed. Overdosage: If you think you have taken too much of this medicine contact a poison control center or emergency room at once. NOTE: This medicine is only for you. Do not share this medicine with others. What if I miss a dose? It is important not to miss a dose. Call your doctor or health care professional if you are unable to keep an appointment. What may interact with this medicine? This medicine may interact with the following medications:  foscarnet  certain antibiotics like amikacin, gentamicin, neomycin, polymyxin B, streptomycin, tobramycin, vancomycin This list may not describe all possible interactions. Give your health care provider a list of all the medicines, herbs, non-prescription drugs, or dietary supplements you use. Also tell them if you smoke, drink alcohol, or use illegal drugs. Some items may interact with your medicine. What should I watch for while using this medicine? Your condition will be monitored carefully while you are receiving this medicine. You will need important blood work done while you are taking this medicine. This drug may make you feel generally unwell. This is not uncommon, as chemotherapy can affect healthy cells as well as cancer cells. Report any side effects. Continue your course of treatment even though you feel ill unless your doctor tells you to stop. This medicine  may increase your risk of getting an infection. Call your healthcare professional for advice if you get a fever, chills, or sore throat, or other symptoms of a cold or flu. Do not treat yourself. Try to avoid being around people who are sick. Avoid  taking medicines that contain aspirin, acetaminophen, ibuprofen, naproxen, or ketoprofen unless instructed by your healthcare professional. These medicines may hide a fever. This medicine may increase your risk to bruise or bleed. Call your doctor or health care professional if you notice any unusual bleeding. Be careful brushing and flossing your teeth or using a toothpick because you may get an infection or bleed more easily. If you have any dental work done, tell your dentist you are receiving this medicine. Do not become pregnant while taking this medicine or for 14 months after stopping it. Women should inform their healthcare professional if they wish to become pregnant or think they might be pregnant. Men should not father a child while taking this medicine and for 11 months after stopping it. There is potential for serious side effects to an unborn child. Talk to your healthcare professional for more information. Do not breast-feed an infant while taking this medicine. This medicine has caused ovarian failure in some women. This medicine may make it more difficult to get pregnant. Talk to your healthcare professional if you are concerned about your fertility. This medicine has caused decreased sperm counts in some men. This may make it more difficult to father a child. Talk to your healthcare professional if you are concerned about your fertility. Drink fluids as directed while you are taking this medicine. This will help protect your kidneys. Call your doctor or health care professional if you get diarrhea. Do not treat yourself. What side effects may I notice from receiving this medicine? Side effects that you should report to your doctor or health care professional as soon as possible:  allergic reactions like skin rash, itching or hives, swelling of the face, lips, or tongue  blurred vision  changes in vision  decreased hearing or ringing of the ears  nausea, vomiting  pain,  redness, or irritation at site where injected  pain, tingling, numbness in the hands or feet  signs and symptoms of bleeding such as bloody or black, tarry stools; red or dark brown urine; spitting up blood or brown material that looks like coffee grounds; red spots on the skin; unusual bruising or bleeding from the eyes, gums, or nose  signs and symptoms of infection like fever; chills; cough; sore throat; pain or trouble passing urine  signs and symptoms of kidney injury like trouble passing urine or change in the amount of urine  signs and symptoms of low red blood cells or anemia such as unusually weak or tired; feeling faint or lightheaded; falls; breathing problems Side effects that usually do not require medical attention (report to your doctor or health care professional if they continue or are bothersome):  loss of appetite  mouth sores  muscle cramps This list may not describe all possible side effects. Call your doctor for medical advice about side effects. You may report side effects to FDA at 1-800-FDA-1088. Where should I keep my medicine? This drug is given in a hospital or clinic and will not be stored at home. NOTE: This sheet is a summary. It may not cover all possible information. If you have questions about this medicine, talk to your doctor, pharmacist, or health care provider.  2021 Elsevier/Gold Standard (2018-10-06 15:59:17)

## 2020-11-07 NOTE — Progress Notes (Signed)
45 year old male diagnosed with tonsillar cancer receiving concurrent chemoradiation therapy.  Past medical history includes diabetes, insomnia, and tobacco usage.  Medications include Lantus, Decadron, Ativan, Prilosec, Zofran, Compazine, and Crestor.  Labs include glucose 127, creatinine 1.43, and magnesium 1.6.  Height: 6 feet 0 inches. Weight: 221.3 pounds January 13. BMI: 30.01.  Estimated nutrition needs: 2400-2600 cal, 120-140 g protein, 2.6 L fluid.  Patient scheduled for PEG on January 28. Consult completed during infusion. Patient reports he has had occasional nausea. Reports severe mucositis. He is interested in maintaining his nutrition and physical activity throughout treatment.  Nutrition diagnosis: Predicted suboptimal energy intake related to tonsil cancer and associated treatments as evidenced by a condition for which research shows suboptimal energy intake.  Intervention: Patient educated on importance of smaller more frequent meals and snacks with adequate calories and protein. Educated on strategies for eating with thickened saliva. Encouraged increased fluid intake. Recommended oral nutrition supplements as tolerated. Brief education provided on feeding tube. Enforced importance of bowel regimen. I provided fact sheets and recipes as well as contact information.  Questions were answered.  Teach back method used.    Monitoring, evaluation, goals: Patient will tolerate adequate calories and protein to minimize weight loss.  Next visit: To be scheduled weekly with treatment.  **Disclaimer: This note was dictated with voice recognition software. Similar sounding words can inadvertently be transcribed and this note may contain transcription errors which may not have been corrected upon publication of note.**

## 2020-11-09 ENCOUNTER — Telehealth: Payer: Self-pay | Admitting: Hematology

## 2020-11-09 ENCOUNTER — Other Ambulatory Visit: Payer: Self-pay | Admitting: Hematology

## 2020-11-09 MED ORDER — BACLOFEN 10 MG PO TABS: 10.0000 mg | ORAL_TABLET | Freq: Three times a day (TID) | ORAL | 0 refills | Status: DC | PRN

## 2020-11-09 NOTE — Telephone Encounter (Signed)
Pt called this morning, he had recurrent hiccups last night which caused some chest pain, did not sleep well.NO other new complains, no nausea. I told him to stop dexa which could cause hiccups, and I called in baclofen to his pharmacy he will use as needed if hiccups recur.   Truitt Merle  11/09/2020

## 2020-11-10 ENCOUNTER — Ambulatory Visit
Admission: RE | Admit: 2020-11-10 | Discharge: 2020-11-10 | Disposition: A | Discharge status: Home / Self Care | Payer: BC Managed Care – PPO | Source: Ambulatory Visit | Attending: Radiation Oncology | Admitting: Radiation Oncology

## 2020-11-10 ENCOUNTER — Other Ambulatory Visit: Payer: Self-pay | Admitting: Medical

## 2020-11-10 ENCOUNTER — Telehealth: Payer: Self-pay | Admitting: *Deleted

## 2020-11-10 DIAGNOSIS — F17210 Nicotine dependence, cigarettes, uncomplicated: Secondary | ICD-10-CM | POA: Diagnosis not present

## 2020-11-10 DIAGNOSIS — C090 Malignant neoplasm of tonsillar fossa: Secondary | ICD-10-CM | POA: Diagnosis not present

## 2020-11-10 DIAGNOSIS — C099 Malignant neoplasm of tonsil, unspecified: Secondary | ICD-10-CM | POA: Diagnosis not present

## 2020-11-10 MED ORDER — HYDROCODONE-ACETAMINOPHEN 7.5-325 MG/15ML PO SOLN: 10.0000 mL | Freq: Four times a day (QID) | ORAL | 0 refills | Status: DC | PRN

## 2020-11-10 NOTE — Telephone Encounter (Signed)
-----   Message from Jolaine Click, RN sent at 11/07/2020 11:07 AM EST ----- Regarding: First Time Cisplain - Dr. Chryl Heck Patient First Time Cisplain - Dr. Chryl Heck Patient

## 2020-11-10 NOTE — Telephone Encounter (Signed)
Left message for pt to return call to discuss how he did with his treatment last week.

## 2020-11-11 ENCOUNTER — Ambulatory Visit
Admission: RE | Admit: 2020-11-11 | Discharge: 2020-11-11 | Disposition: A | Discharge status: Home / Self Care | Payer: BC Managed Care – PPO | Source: Ambulatory Visit | Attending: Radiation Oncology | Admitting: Radiation Oncology

## 2020-11-11 ENCOUNTER — Other Ambulatory Visit: Payer: BC Managed Care – PPO

## 2020-11-11 ENCOUNTER — Encounter: Payer: BC Managed Care – PPO | Admitting: Nutrition

## 2020-11-11 ENCOUNTER — Ambulatory Visit: Payer: BC Managed Care – PPO

## 2020-11-11 ENCOUNTER — Ambulatory Visit: Payer: BC Managed Care – PPO | Admitting: Hematology and Oncology

## 2020-11-11 DIAGNOSIS — C090 Malignant neoplasm of tonsillar fossa: Secondary | ICD-10-CM | POA: Diagnosis not present

## 2020-11-11 DIAGNOSIS — F17210 Nicotine dependence, cigarettes, uncomplicated: Secondary | ICD-10-CM | POA: Diagnosis not present

## 2020-11-11 DIAGNOSIS — C099 Malignant neoplasm of tonsil, unspecified: Secondary | ICD-10-CM | POA: Diagnosis not present

## 2020-11-12 ENCOUNTER — Other Ambulatory Visit: Payer: Self-pay | Admitting: Hematology and Oncology

## 2020-11-12 ENCOUNTER — Encounter: Payer: Self-pay | Admitting: Hematology and Oncology

## 2020-11-12 ENCOUNTER — Other Ambulatory Visit: Payer: Self-pay | Admitting: Radiation Oncology

## 2020-11-12 ENCOUNTER — Ambulatory Visit
Admission: RE | Admit: 2020-11-12 | Discharge: 2020-11-12 | Disposition: A | Discharge status: Home / Self Care | Payer: BC Managed Care – PPO | Source: Ambulatory Visit | Attending: Radiation Oncology | Admitting: Radiation Oncology

## 2020-11-12 DIAGNOSIS — C099 Malignant neoplasm of tonsil, unspecified: Secondary | ICD-10-CM | POA: Diagnosis not present

## 2020-11-12 DIAGNOSIS — C090 Malignant neoplasm of tonsillar fossa: Secondary | ICD-10-CM

## 2020-11-12 DIAGNOSIS — F17210 Nicotine dependence, cigarettes, uncomplicated: Secondary | ICD-10-CM | POA: Diagnosis not present

## 2020-11-12 MED ORDER — MORPHINE SULFATE ER 15 MG PO TBCR: 15.0000 mg | EXTENDED_RELEASE_TABLET | Freq: Two times a day (BID) | ORAL | 0 refills | Status: DC

## 2020-11-12 MED ORDER — SCOPOLAMINE 1 MG/3DAYS TD PT72: 1.0000 | MEDICATED_PATCH | TRANSDERMAL | 5 refills | Status: DC

## 2020-11-12 NOTE — Progress Notes (Signed)
[facility] NOTE  Patient Care Team: Pleas [name], NP as PCP - General (Internal Medicine) Festus Aloe, MD as Consulting Physician (Urology) Melida Quitter, MD as Consulting Physician (Otolaryngology) Karie Mainland, RD as Dietitian (Nutrition)  CHIEF COMPLAINTS/PURPOSE OF CONSULTATION:  SCC tonsil  ASSESSMENT & PLAN:  No problem-specific Assessment & Plan notes found for this encounter.  No orders of the defined types were placed in this encounter.  This is a 45 yr old male patient with newly diagnosed T3( primary tumor according to TB discussion is borderline 4 cm), N1( multiple lymph nodes ipsilateral but PET pending) and M0/Stage II  referred to medical oncology for treatment consideration We reviewed his imaging, pathology and discussed possible options for treatment in TB today. He followed up with ENT team at [facility] for consideration of surgery.   Given bilateral neck disease, recommendation was to proceed with concurrent chemoradiation. Given young age and excellent PS at baseline with no significant comorbidities, I recommended that he consider cisplatin every 21-day dosing at 100 mg/m for 3 doses concurrent with radiation.  We have discussed about side effects including and not limited to fatigue, nausea, increased risk of infection, ototoxicity with occasional permanent hearing loss, nephrotoxicity which can be permanent in patients etc. He started radiation on 10/28/2020, chemo delayed because of COVID Received first dose of 100 mg/m2 cisplatin on 11/07/2020  2. Treatment induced mucositis,  MS Contin 15 mg twice a day for long-acting pain medication, hydrocodone as needed pain medication.  3. dysgeusia, weight loss.  Encouraged to ensure 3-4 times a day minimum.  He will be getting his PEG tube next week, will set up a follow-up visit with nutrition.  4. Anxiety, prescribed ativan which can be used both for CINV and nausea.  5,  fatigue secondary to treatment, encourage hydration, rest and as tolerated activity  6.  Dehydration, will arrange for IV fluids and magnesium replacement.  HISTORY OF PRESENTING ILLNESS:   Justin Baldwin 45 y.o. male is here because of new diagnosis of SCC tonsil/oropharynx.  HISTORY OF PRESENT ILLNESS:  Justin Baldwin is a 45 y.o. male who presented with ongoing head cold symptoms, sinus issues, left ear pain, and left-sided throat pain since September.  When the cold-like symptoms resolved, the throat and ear pain did not. Augmentin was prescribed without relief.  Subsequently, the patient saw Dr. Redmond Baseman, ENT at [facility], who noted that the left tonsil had a mass appearance on 09/19/2020. 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  Pertinent imaging thus far includes soft tissue neck CT scan on 09/30/2020 revealed a left tonsillar mass abutting the glossotonsillar sulcus and base of the tongue, consistent with known malignancy. Left level 2/3 cystic or necrotic adenopathy reflected nodal metastasis.  CT scan of chest performed on 09/30/2020 revealing a non-specific left lower lobe pulmonary micronodule but no acute intrathoracic abnormality and no definite intrathoracic metastasis.  I have personally reviewed his imaging and discussed him with Dr. Redmond Baseman.    He had a PET scan which showed hypermetabolic left tonsillar mass and bilateral lymphadenopathy consistent with metastatic disease.  Hence recommendation was to proceed with concurrent chemoradiation.  He started radiation on 10/28/2020 chemotherapy delayed to 11/07/2020 because of COVID-19 infection.  He is here for follow-up.  He feels very tired, wants to eat but nothing tastes good so has not been eating very well.  He has lost about 13 pounds since the  start of treatment. Nausea has been very well controlled so far. Sore throat, has been taking hydrocodone every 6 hours, started on MS Contin for  long-acting pain medication. Anxiety well controlled with Ativan.  He is scheduled to get his PEG tube next week. No change in breathing, bowel habits or urinary habits otherwise.  Rest of the pertinent 10 point ROS reviewed and negative  MEDICAL HISTORY:  Past Medical History:  Diagnosis Date  . Diabetes mellitus without complication ([place])   . Erectile dysfunction   . Insomnia   . Skin abnormality    hidradenitis suppurativa     SURGICAL HISTORY: Past Surgical History:  Procedure Laterality Date  . IRRIGATION AND DEBRIDEMENT ABSCESS N/A 09/22/2018   Procedure: IRRIGATION AND DEBRIDEMENT ABSCESS;  Surgeon: Ralene Ok, MD;  Location: [facility] ORS;  Service: General;  Laterality: N/A;    SOCIAL HISTORY: Social History   Socioeconomic History  . Marital status: Married    Spouse name: Not on file  . Number of children: Not on file  . Years of education: Not on file  . Highest education level: Not on file  Occupational History  . Not on file  Tobacco Use  . Smoking status: Current Every Day Smoker    Packs/day: 1.00    Types: Cigarettes  . Smokeless tobacco: Former Systems developer    Quit date: 02/14/2016  Vaping Use  . Vaping Use: Never used  Substance and Sexual Activity  . Alcohol use: Not Currently    Alcohol/week: 5.0 standard drinks    Types: 5 Shots of liquor per week    Comment: occasinal   . Drug use: No  . Sexual activity: Yes  Other Topics Concern  . Not on file  Social History Narrative   ** Merged History Encounter **       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    Social Determinants of Health   Financial Resource Strain: Low Risk   . Difficulty of Paying Living Expenses: Not hard at all  Food Insecurity: No Food Insecurity  . Worried About Charity fundraiser in the Last Year: Never true  . Ran Out of Food in the Last Year: Never true  Transportation Needs: No Transportation Needs  . Lack of Transportation  (Medical): No  . Lack of Transportation (Non-Medical): No  Physical Activity: Not on file  Stress: Not on file  Social Connections: Not on file  Intimate Partner Violence: Not on file    FAMILY HISTORY: Family History  Problem Relation Age of Onset  . Heart attack Father   . Emphysema Paternal Grandmother   . Arthritis Mother     ALLERGIES:  has No Known Allergies.  MEDICATIONS:  Current Outpatient Medications  Medication Sig Dispense Refill  . baclofen (LIORESAL) 10 MG tablet Take 1 tablet (10 mg total) by mouth every 8 (eight) hours as needed (hiccups). 20 each 0  . blood glucose meter kit and supplies KIT Dispense based on patient and insurance preference. Use up to four times daily as directed. (FOR ICD-9 250.00, 250.01). 1 each 0  . dexamethasone (DECADRON) 4 MG tablet Take 2 tablets (8 mg total) by mouth daily. Take daily x 3 days starting the day after cisplatin chemotherapy. Take with food. 30 tablet 1  . fluticasone (FLONASE) 50 MCG/ACT nasal spray Place 2 sprays into both nostrils daily. 16 g 1  . glucose blood test strip Use three times daily as instructed 100 each  3  . HYDROcodone-acetaminophen (HYCET) 7.5-325 mg/15 ml solution Take 10 mLs by mouth every 6 (six) hours as needed for moderate pain. 120 mL 0  . insulin glargine (LANTUS) 100 UNIT/ML injection Inject 0.1 mLs (10 Units total) into the skin daily. 10 mL 2  . lidocaine (XYLOCAINE) 2 % solution Patient: Mix 1part 2% viscous lidocaine, 1part H20. Swish & swallow 13m of diluted mixture, 360m before meals and at bedtime, up to QID 200 mL 4  . lidocaine-prilocaine (EMLA) cream Apply to affected area once 30 g 3  . LORazepam (ATIVAN) 0.5 MG tablet Take 1 tablet (0.5 mg total) by mouth every 8 (eight) hours as needed for anxiety (Nausea or vomiting, take 1-2 tabs as needed every 6 hrs for anxiety, nausea or vomiting). 45 tablet 0  . metFORMIN (GLUCOPHAGE XR) 500 MG 24 hr tablet Take 1 tablet (500 mg total) by mouth  daily with breakfast. For diabetes. 90 tablet 3  . morphine (MS CONTIN) 15 MG 12 hr tablet Take 1 tablet (15 mg total) by mouth every 12 (twelve) hours for 20 days. 40 tablet 0  . omeprazole (PRILOSEC) 20 MG capsule TAKE 1 CAPSULE BY MOUTH DAILY FOR HEARTBURN 90 capsule 3  . ondansetron (ZOFRAN) 8 MG tablet Take 1 tablet (8 mg total) by mouth 2 (two) times daily as needed. Start on the third day after cisplatin chemotherapy. 30 tablet 1  . oxymetazoline (AFRIN) 0.05 % nasal spray Place 1-2 sprays into both nostrils as needed for congestion.     . prochlorperazine (COMPAZINE) 10 MG tablet Take 1 tablet (10 mg total) by mouth every 6 (six) hours as needed (Nausea or vomiting). 30 tablet 1  . rosuvastatin (CRESTOR) 40 MG tablet Take 1 tablet (40 mg total) by mouth every evening. For cholesterol. 90 tablet 3  . scopolamine (TRANSDERM-SCOP) 1 MG/3DAYS Place 1 patch (1.5 mg total) onto the skin every 3 (three) days. 4 patch 5  . tadalafil (CIALIS) 5 MG tablet Take 1 tablet (5 mg total) by mouth daily as needed for erectile dysfunction. 90 tablet 0  . zolpidem (AMBIEN) 5 MG tablet Take 1 tablet (5 mg total) by mouth at bedtime as needed for sleep. 30 tablet 0   No current facility-administered medications for this visit.     PHYSICAL EXAMINATION: ECOG PERFORMANCE STATUS: 0 - Asymptomatic  Vitals:   11/13/20 0837  BP: 125/75  Pulse: 69  Resp: 18  Temp: (!) 97 F (36.1 C)  SpO2: 99%   Filed Weights   11/13/20 0837  Weight: 217 lb 14.4 oz (98.8 kg)    GENERAL:alert, no distress and comfortable SKIN: skin color, texture, turgor are normal, no rashes or significant lesions EYES: normal, conjunctiva are pink and non-injected, sclera clear Mucous membranes appear dry NECK: supple, thyroid normal size, non-tender, without nodularity, palpable tenderness over the area likely from radiation, no evidence of infection. LYMPH: NO palpable LN LUNGS: clear to auscultation and percussion with normal  breathing effort HEART: regular rate & rhythm and no murmurs and no lower extremity edema ABDOMEN:abdomen soft, non-tender and normal bowel sounds Musculoskeletal:no cyanosis of digits and no clubbing  PSYCH: alert & oriented x 3 with fluent speech NEURO: no focal motor/sensory deficits  LABORATORY DATA:  I have reviewed the data as listed Lab Results  Component Value Date   WBC 6.6 11/13/2020   HGB 15.5 11/13/2020   HCT 44.7 11/13/2020   MCV 88.5 11/13/2020   PLT 169 11/13/2020     Chemistry  Component Value Date/Time   NA 131 (L) 11/13/2020 0816   NA 135 09/12/2019 1048   K 4.1 11/13/2020 0816   CL 91 (L) 11/13/2020 0816   CO2 33 (H) 11/13/2020 0816   BUN 15 11/13/2020 0816   BUN 18 09/12/2019 1048   CREATININE 1.43 (H) 11/13/2020 0816   CREATININE 1.38 (H) 11/30/2018 1102      Component Value Date/Time   CALCIUM 8.8 (L) 11/13/2020 0816   ALKPHOS 43 10/15/2020 1132   AST 35 10/15/2020 1132   ALT 79 (H) 10/15/2020 1132   BILITOT 0.8 10/15/2020 1132     I reviewed labs from today, will arrange for hydration and magnesium replacement  RADIOGRAPHIC STUDIES: I have personally reviewed the radiological images as listed and agreed with the findings in the report. No results found. Final Pathologic Diagnosis    TONSIL, BIOPSY:              Squamous cell carcinoma, HPV associated.  Electronically signed by Garnette Czech, MD on 09/25/2020 at 3:50 PM     All questions were answered. The patient knows to call the clinic with any problems, questions or concerns. I spent a total of 30 minutes in the care of this patient including history and physical, review of medical records, counseling and coordination of care.    Benay Pike, MD 11/13/2020 9:20 AM

## 2020-11-13 ENCOUNTER — Other Ambulatory Visit: Payer: Self-pay

## 2020-11-13 ENCOUNTER — Inpatient Hospital Stay: Payer: BC Managed Care – PPO

## 2020-11-13 ENCOUNTER — Ambulatory Visit
Admission: RE | Admit: 2020-11-13 | Discharge: 2020-11-13 | Disposition: A | Discharge status: Home / Self Care | Payer: BC Managed Care – PPO | Source: Ambulatory Visit | Attending: Radiation Oncology | Admitting: Radiation Oncology

## 2020-11-13 ENCOUNTER — Ambulatory Visit: Payer: BC Managed Care – PPO | Attending: Radiation Oncology

## 2020-11-13 ENCOUNTER — Ambulatory Visit: Payer: BC Managed Care – PPO

## 2020-11-13 ENCOUNTER — Encounter: Payer: BC Managed Care – PPO | Admitting: Nutrition

## 2020-11-13 ENCOUNTER — Encounter: Payer: Self-pay | Admitting: General Practice

## 2020-11-13 ENCOUNTER — Encounter: Payer: Self-pay | Admitting: Hematology and Oncology

## 2020-11-13 ENCOUNTER — Ambulatory Visit: Payer: BC Managed Care – PPO | Admitting: Physical Therapy

## 2020-11-13 ENCOUNTER — Inpatient Hospital Stay (HOSPITAL_BASED_OUTPATIENT_CLINIC_OR_DEPARTMENT_OTHER): Payer: BC Managed Care – PPO | Admitting: Hematology and Oncology

## 2020-11-13 ENCOUNTER — Inpatient Hospital Stay: Payer: BC Managed Care – PPO | Admitting: Nutrition

## 2020-11-13 VITALS — BP 125/75 | HR 69 | Temp 97.0°F | Resp 18 | Ht 72.0 in | Wt 217.9 lb

## 2020-11-13 DIAGNOSIS — N179 Acute kidney failure, unspecified: Secondary | ICD-10-CM | POA: Diagnosis not present

## 2020-11-13 DIAGNOSIS — C090 Malignant neoplasm of tonsillar fossa: Secondary | ICD-10-CM

## 2020-11-13 DIAGNOSIS — R432 Parageusia: Secondary | ICD-10-CM | POA: Diagnosis not present

## 2020-11-13 DIAGNOSIS — R1310 Dysphagia, unspecified: Secondary | ICD-10-CM

## 2020-11-13 DIAGNOSIS — E860 Dehydration: Secondary | ICD-10-CM | POA: Diagnosis not present

## 2020-11-13 DIAGNOSIS — K1230 Oral mucositis (ulcerative), unspecified: Secondary | ICD-10-CM | POA: Diagnosis not present

## 2020-11-13 DIAGNOSIS — Z794 Long term (current) use of insulin: Secondary | ICD-10-CM | POA: Diagnosis not present

## 2020-11-13 DIAGNOSIS — A630 Anogenital (venereal) warts: Secondary | ICD-10-CM | POA: Diagnosis not present

## 2020-11-13 DIAGNOSIS — K5900 Constipation, unspecified: Secondary | ICD-10-CM | POA: Diagnosis not present

## 2020-11-13 DIAGNOSIS — Z7984 Long term (current) use of oral hypoglycemic drugs: Secondary | ICD-10-CM | POA: Diagnosis not present

## 2020-11-13 DIAGNOSIS — C099 Malignant neoplasm of tonsil, unspecified: Secondary | ICD-10-CM | POA: Diagnosis not present

## 2020-11-13 DIAGNOSIS — F17210 Nicotine dependence, cigarettes, uncomplicated: Secondary | ICD-10-CM | POA: Diagnosis not present

## 2020-11-13 DIAGNOSIS — R5383 Other fatigue: Secondary | ICD-10-CM | POA: Diagnosis not present

## 2020-11-13 DIAGNOSIS — F419 Anxiety disorder, unspecified: Secondary | ICD-10-CM | POA: Diagnosis not present

## 2020-11-13 DIAGNOSIS — E119 Type 2 diabetes mellitus without complications: Secondary | ICD-10-CM | POA: Diagnosis not present

## 2020-11-13 DIAGNOSIS — Z5111 Encounter for antineoplastic chemotherapy: Secondary | ICD-10-CM | POA: Diagnosis not present

## 2020-11-13 DIAGNOSIS — R634 Abnormal weight loss: Secondary | ICD-10-CM | POA: Diagnosis not present

## 2020-11-13 LAB — BASIC METABOLIC PANEL - CANCER CENTER ONLY
Anion gap: 7 (ref 5–15)
BUN: 15 mg/dL (ref 6–20)
CO2: 33 mmol/L — ABNORMAL HIGH (ref 22–32)
Calcium: 8.8 mg/dL — ABNORMAL LOW (ref 8.9–10.3)
Chloride: 91 mmol/L — ABNORMAL LOW (ref 98–111)
Creatinine: 1.43 mg/dL — ABNORMAL HIGH (ref 0.61–1.24)
GFR, Estimated: >60 mL/min (ref >60)
Glucose, Bld: 172 mg/dL — ABNORMAL HIGH (ref 70–99)
Potassium: 4.1 mmol/L (ref 3.5–5.1)
Sodium: 131 mmol/L — ABNORMAL LOW (ref 135–145)

## 2020-11-13 LAB — CBC WITH DIFFERENTIAL (CANCER CENTER ONLY)
Abs Immature Granulocytes: 0.02 10*3/uL (ref 0.00–0.07)
Basophils Absolute: 0 10*3/uL (ref 0.0–0.1)
Basophils Relative: 0 %
Eosinophils Absolute: 0.1 10*3/uL (ref 0.0–0.5)
Eosinophils Relative: 1 %
HCT: 44.7 % (ref 39.0–52.0)
Hemoglobin: 15.5 g/dL (ref 13.0–17.0)
Immature Granulocytes: 0 %
Lymphocytes Relative: 8 %
Lymphs Abs: 0.6 10*3/uL — ABNORMAL LOW (ref 0.7–4.0)
MCH: 30.7 pg (ref 26.0–34.0)
MCHC: 34.7 g/dL (ref 30.0–36.0)
MCV: 88.5 fL (ref 80.0–100.0)
Monocytes Absolute: 0.6 10*3/uL (ref 0.1–1.0)
Monocytes Relative: 9 %
Neutro Abs: 5.4 10*3/uL (ref 1.7–7.7)
Neutrophils Relative %: 82 %
Platelet Count: 169 10*3/uL (ref 150–400)
RBC: 5.05 MIL/uL (ref 4.22–5.81)
RDW: 11.8 % (ref 11.5–15.5)
WBC Count: 6.6 10*3/uL (ref 4.0–10.5)
nRBC: 0 % (ref 0.0–0.2)

## 2020-11-13 LAB — MAGNESIUM: Magnesium: 1.5 mg/dL — ABNORMAL LOW (ref 1.7–2.4)

## 2020-11-13 MED ORDER — SODIUM CHLORIDE 0.9 % IV SOLN
INTRAVENOUS | Status: DC
  Filled 2020-11-13 (×2): qty 250

## 2020-11-13 MED ORDER — SODIUM CHLORIDE 0.9 % IV SOLN
INTRAVENOUS | Status: DC
  Filled 2020-11-13: qty 250

## 2020-11-13 MED ORDER — MAGNESIUM SULFATE 2 GM/50ML IV SOLN
2.0000 g | Freq: Once | INTRAVENOUS | Status: AC
  Administered 2020-11-13: 2 g via INTRAVENOUS

## 2020-11-13 MED ORDER — HYDROCODONE-ACETAMINOPHEN 7.5-325 MG/15ML PO SOLN: 10.0000 mL | Freq: Four times a day (QID) | ORAL | 0 refills | Status: DC | PRN

## 2020-11-13 MED ORDER — MAGNESIUM SULFATE 2 GM/50ML IV SOLN: 2.0000 g | Freq: Once | INTRAVENOUS | Status: DC

## 2020-11-13 MED ORDER — MAGNESIUM SULFATE 2 GM/50ML IV SOLN
INTRAVENOUS | Status: AC
  Filled 2020-11-13: qty 50

## 2020-11-13 NOTE — Therapy (Signed)
[facility] [address] [place], [place], [address] Phone: [phone]   Fax:  [phone]  Speech Language Pathology Evaluation  Patient Details  Name: Justin Baldwin MRN: [id number] Date of Birth: 02-Feb-1976 Referring Provider (SLP): Eppie Gibson, MD   Encounter Date: 11/13/2020   End of Session - 11/13/20 1117    Visit Number 1    Number of Visits 7    Date for SLP Re-Evaluation 02/11/21   90 days   SLP Start Time 0925    SLP Stop Time  1002    SLP Time Calculation (min) 37 min    Activity Tolerance Patient tolerated treatment well   odynophagia (ear pain) when swallwing          Past Medical History:  Diagnosis Date  . Diabetes mellitus without complication ([place])   . Erectile dysfunction   . Insomnia   . Skin abnormality    hidradenitis suppurativa     Past Surgical History:  Procedure Laterality Date  . IRRIGATION AND DEBRIDEMENT ABSCESS N/A 09/22/2018   Procedure: IRRIGATION AND DEBRIDEMENT ABSCESS;  Surgeon: Ralene Ok, MD;  Location: [facility];  Service: General;  Laterality: N/A;    There were no vitals filed for this visit.   Subjective Assessment - 11/13/20 1114    Subjective Pt reports rare throat clearing with dry solids.    Currently in Pain? Yes    Pain Score 5     Pain Location Ear    Pain Orientation Left    Pain Descriptors / Indicators Burning    Pain Type Acute pain    Pain Frequency Constant              SLP Evaluation [facility] - 11/13/20 1023      SLP Visit Information   SLP Received On 11/13/20    Referring Provider (SLP) Eppie Gibson, MD    Onset Date September 2021    Medical Diagnosis Lt tonsil SCCA      Subjective   Patient/Family Stated Goal Maintain WNL swallowing skils      General Information   HPI Presented with ongoing head cold sx, lt ear pain, and lt sided throat pain since September 2021. 09/19/20 Dr Redmond Baseman noted lt tonsil had mass appearance, took  biopsy, and revealed SCCA. CT neck 09-30-20 also found lt level 2/3 cystic or necrotic adenopathy ID'ing nodal metastasis. PET 31-54-00 hypermatabolic lt tonsilar mass compatible with SCCA and evidence of bil cervical chain nodal mets. PEG planned 11/21/20, pt began rad 10-28-20 and chemo 11-07-20 (every 3-weeks Cisplatin).      Balance Screen   Has the patient fallen in the past 6 months No      Prior Functional Status   Cognitive/Linguistic Baseline Within functional limits    Type of Home House     Lives With Family    Vocation Part time employment      Cognition   Overall Cognitive Status Within Functional Limits for tasks assessed      Auditory Comprehension   Overall Auditory Comprehension Appears within functional limits for tasks assessed      Oral Motor/Sensory Function   Overall Oral Motor/Sensory Function Appears within functional limits for tasks assessed            Because data states the risk for dysphagia during and after radiation treatment is high due to undergoing radiation tx, SLP taught pt about the possibility of reduced/limited ability for PO intake during rad tx. SLP encouraged  pt to continue swallowing POs as far into rad tx as possible, even ingesting POs and/or completing HEP shortly after administration of pain meds, if prescribed. Among other modifications for days when pt cannot functionally swallow, SLP talked about performing only non-swallowing tasks on the handout/HEP, and then adding swallowing tasks back in when it becomes possible to do so.  SLP educated pt re: changes to swallowing musculature after rad tx, and why adherence to dysphagia HEP provided today and PO consumption was necessary to inhibit muscular disuse atrophy and to reduce muscle fibrosis following rad tx. Pt demonstrated understanding of these things to SLP.    SLP then developed a HEP for pt and pt was instructed how to perform exercises involving lingual, vocal, and pharyngeal  strengthening. SLP performed each exercise and pt return demonstrated each exercise. SLP ensured pt performance was correct prior to moving on to next exercise. Pt was instructed to complete this program 2 times a day, until 6 months after his or her last rad tx, then x2-3 a week after that.                SLP Education - 11/13/20 1116    Education Details late effects head/neck radiation on swallow ability, HEP procedure and rationale, see free text comments above   Person(s) Educated Patient    Methods Explanation;Demonstration;Verbal cues;Handout    Comprehension Verbalized understanding;Returned demonstration;Verbal cues required;Need further instruction            SLP Short Term Goals - 11/13/20 1125      SLP SHORT TERM GOAL #1   Title Pt will complete HEP with rare min A over two sessions    Time 2    Period --   sessions, for all STGs   Status New      SLP SHORT TERM GOAL #2   Title Pt will tell SLP rationale for HEP completion in 2 sessions    Time 2    Status New      SLP SHORT TERM GOAL #3   Title Pt will tell SLP how a food journal can hasten/facilitate return to more normalized diet    Time 2    Status New      SLP SHORT TERM GOAL #4   Title Pt will tell SLP 3 overt s/s aspiration PNA with modified independence    Time 3    Status New            SLP Long Term Goals - 11/13/20 1126      SLP LONG TERM GOAL #1   Title Pt will complete HEP with modified independence in 3 visits    Time 4    Period --   sessions, for all LTGs   Status New      SLP LONG TERM GOAL #2   Title Pt will tell SLP when to decr frequency of HEP to x2-3/week    Time 6    Status New            Plan - 11/13/20 1118    Clinical Impression Statement Justin Baldwin presents today with [facility] swallowing ability with ham sandwich and Gatorade - no overt s/sx oral or pharyngeal difficulty noted today however pt tells SLP of rare throat clearing with dry solids. SLP skillfully observed  that Justin Baldwin with 80% of swallows of sandwich indicating likely odynophagia. No overt s/sx aspiration PNA reported nor observed today. Data suggests that as Justin Baldwin progresses through his chemorad therapy that his  swallowing ability will decrease. Also, WNL swallowing is threatened by muscle fibrosis that would likely develop after chemorad is completed. Therefore, SLP developed an indivdualized exercise program to mitigate/eliminate pt's muscle fibrosis following and as a result of pt's rad tx. Pt voiced back these dangers to SLP spontaneously at the end of the eval today. Skilled ST would be beneficial for Justin Baldwin with POs and/or the need for instrumental swallow assessment, as well as to assess accurate completion of swallowing HEP.    Speech Therapy Frequency --   once approx every four weeks   Duration --   6 months/7 total visits   Treatment/Interventions Aspiration precaution training;Pharyngeal strengthening exercises;Diet toleration management by SLP;Trials of upgraded texture/liquids;Patient/family education;Compensatory strategies;SLP instruction and feedback;Environmental controls    Potential to [place] provided today    Consulted and Agree with Plan of Care Patient           Patient will benefit from skilled therapeutic intervention in order to improve the following deficits and impairments:   Dysphagia, unspecified type    Problem List Patient Active Problem List   Diagnosis Date Noted  . Cancer of tonsillar fossa ([place]) 10/01/2020  . Type 2 diabetes mellitus without complication, without long-term current use of insulin ([place]) 09/03/2019  . Hyperglycemia 09/03/2019  . Gastroesophageal reflux disease 03/06/2019  . Atherosclerosis of aorta ([place]) 11/30/2018  . Dizziness 11/30/2018  . Medication monitoring encounter 10/31/2018  . Atypical mycobacterium infection   . Abscess of buttock, left 09/22/2018  .  Hydradenitis 09/22/2018  . Kidney insufficiency 09/22/2018  . Lump 08/31/2018  . Deep tissue injury 08/25/2018  . Skin rash 07/25/2018  . Fatigue 11/08/2017  . Vertigo 07/11/2017  . Preventative health care 03/29/2016  . Insomnia 03/15/2016  . Erectile dysfunction 03/15/2016    [facility] ,MS, CCC-SLP  11/13/2020, 11:28 AM  [facility] [address] [place], [place], [address] Phone: [phone]   Fax:  [phone]  Name: Justin Baldwin MRN: [id number] Date of Birth: 1976-04-27

## 2020-11-13 NOTE — Progress Notes (Signed)
[place] CSW Progress Notes  Met w patient in exam room during Head and Neck [facility].  He has completed two weeks of his treatment regimen.  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  He is the sole support of his family.  He hopes to work tomorrow, will see if he is able to tolerate a day of work.  Discussed option of referral to [facility] - he will let CSW know if he needs this support for basic living expenses.  Provided information on resources available through Patient and [facility] and encouraged him to reach out as needed.  Edwyna Shell, LCSW Clinical Social Worker Phone:  [phone]

## 2020-11-13 NOTE — Patient Instructions (Signed)
SWALLOWING EXERCISES Do these until 6 months after your last day of radiation, then 2 times per week afterwards  1. Effortful Swallows - Press your tongue against the roof of your mouth for 3 seconds, then squeeze the muscles in your neck while you swallow your saliva or a sip of water - Repeat 10-15 times, 2-3 times a day, and use whenever you eat or drink  2. Masako Swallow - swallow with your tongue sticking out - Stick tongue out past your lips and gently bite tongue with your teeth - Swallow, while holding your tongue with your teeth - Repeat 10-15 times, 2-3 times a day *use a wet spoon if your mouth gets dry*  3. Pitch Raise - Repeat "he", once per second in as high of a pitch as you can - Repeat 20 times, 2-3 times a day  4. Shaker Exercise - head lift - Lie flat on your back in your bed or on a couch without pillows - Raise your head and look at your feet - KEEP YOUR SHOULDERS DOWN - HOLD FOR 45-60 SECONDS, then lower your head back down - Repeat 3 times, 2-3 times a day  5. Mendelsohn Maneuver - "half swallow" exercise - Start to swallow, and keep your Adam's apple up by squeezing hard with the muscles of the throat - Hold the squeeze for 5-7 seconds and then relax - Repeat 10-15 times, 2-3 times a day *use a wet spoon if your mouth gets dry*        6.  "Super Swallow"  - Take a breath and hold it  - Bear down (like pushing your bowels)  - Swallow then IMMEDIATELY cough  - Repeat 10 times, 2-3 times a day

## 2020-11-13 NOTE — Patient Instructions (Signed)
Rehydration, Adult Rehydration is the replacement of body fluids, salts, and minerals (electrolytes) that are lost during dehydration. Dehydration is when there is not enough water or other fluids in the body. This happens when you lose more fluids than you take in. Common causes of dehydration include:  Not drinking enough fluids. This can occur when you are ill or doing activities that require a lot of energy, especially in hot weather.  Conditions that cause loss of water or other fluids, such as diarrhea, vomiting, sweating, or urinating a lot.  Other illnesses, such as fever or infection.  Certain medicines, such as those that remove excess fluid from the body (diuretics). Symptoms of mild or moderate dehydration may include thirst, dry lips and mouth, and dizziness. Symptoms of severe dehydration may include increased heart rate, confusion, fainting, and not urinating. For severe dehydration, you may need to get fluids through an IV at the hospital. For mild or moderate dehydration, you can usually rehydrate at home by drinking certain fluids as told by your health care provider. What are the risks? Generally, rehydration is safe. However, taking in too much fluid (overhydration) can be a problem. This is rare. Overhydration can cause an electrolyte imbalance, kidney failure, or a decrease in salt (sodium) levels in the body. Supplies needed You will need an oral rehydration solution (ORS) if your health care provider tells you to use one. This is a drink to treat dehydration. It can be found in pharmacies and retail stores. How to rehydrate Fluids Follow instructions from your health care provider for rehydration. The kind of fluid and the amount you should drink depend on your condition. In general, you should choose drinks that you prefer.  If told by your health care provider, drink an ORS. ? Make an ORS by following instructions on the package. ? Start by drinking small amounts,  about  cup (120 mL) every 5-10 minutes. ? Slowly increase how much you drink until you have taken the amount recommended by your health care provider.  Drink enough clear fluids to keep your urine pale yellow. If you were told to drink an ORS, finish it first, then start slowly drinking other clear fluids. Drink fluids such as: ? Water. This includes sparkling water and flavored water. Drinking only water can lead to having too little sodium in your body (hyponatremia). Follow the advice of your health care provider. ? Water from ice chips you suck on. ? Fruit juice with water you add to it (diluted). ? Sports drinks. ? Hot or cold herbal teas. ? Broth-based soups. ? Milk or milk products. Food Follow instructions from your health care provider about what to eat while you rehydrate. Your health care provider may recommend that you slowly begin eating regular foods in small amounts.  Eat foods that contain a healthy balance of electrolytes, such as bananas, oranges, potatoes, tomatoes, and spinach.  Avoid foods that are greasy or contain a lot of sugar. In some cases, you may get nutrition through a feeding tube that is passed through your nose and into your stomach (nasogastric tube, or NG tube). This may be done if you have uncontrolled vomiting or diarrhea.   Beverages to avoid Certain beverages may make dehydration worse. While you rehydrate, avoid drinking alcohol.   How to tell if you are recovering from dehydration You may be recovering from dehydration if:  You are urinating more often than before you started rehydrating.  Your urine is pale yellow.  Your energy level   improves.  You vomit less frequently.  You have diarrhea less frequently.  Your appetite improves or returns to normal.  You feel less dizzy or less light-headed.  Your skin tone and color start to look more normal. Follow these instructions at home:  Take over-the-counter and prescription medicines only  as told by your health care provider.  Do not take sodium tablets. Doing this can lead to having too much sodium in your body (hypernatremia). Contact a health care provider if:  You continue to have symptoms of mild or moderate dehydration, such as: ? Thirst. ? Dry lips. ? Slightly dry mouth. ? Dizziness. ? Dark urine or less urine than normal. ? Muscle cramps.  You continue to vomit or have diarrhea. Get help right away if you:  Have symptoms of dehydration that get worse.  Have a fever.  Have a severe headache.  Have been vomiting and the following happens: ? Your vomiting gets worse or does not go away. ? Your vomit includes blood or green matter (bile). ? You cannot eat or drink without vomiting.  Have problems with urination or bowel movements, such as: ? Diarrhea that gets worse or does not go away. ? Blood in your stool (feces). This may cause stool to look black and tarry. ? Not urinating, or urinating only a small amount of very dark urine, within 6-8 hours.  Have trouble breathing.  Have symptoms that get worse with treatment. These symptoms may represent a serious problem that is an emergency. Do not wait to see if the symptoms will go away. Get medical help right away. Call your local emergency services (911 in the [place] Do not drive yourself to the hospital. Summary  Rehydration is the replacement of body fluids and minerals (electrolytes) that are lost during dehydration.  Follow instructions from your health care provider for rehydration. The kind of fluid and amount you should drink depend on your condition.  Slowly increase how much you drink until you have taken the amount recommended by your health care provider.  Contact your health care provider if you continue to show signs of mild or moderate dehydration. This information is not intended to replace advice given to you by your health care provider. Make sure you discuss any questions you have with  your health care provider. Document Revised: 12/12/2019 Document Reviewed: 10/22/2019 Elsevier Patient Education  2021 Elsevier Inc.   Hypomagnesemia Hypomagnesemia is a condition in which the level of magnesium in the blood is low. Magnesium is a mineral that is found in many foods. It is used in many different processes in the body. Hypomagnesemia can affect every organ in the body. In severe cases, it can cause life-threatening problems. What are the causes? This condition may be caused by:  Not getting enough magnesium in your diet.  Malnutrition.  Problems with absorbing magnesium from the intestines.  Dehydration.  Alcohol abuse.  Vomiting.  Severe or chronic diarrhea.  Some medicines, including medicines that make you urinate more (diuretics).  Certain diseases, such as kidney disease, diabetes, celiac disease, and overactive thyroid. What are the signs or symptoms? Symptoms of this condition include:  Loss of appetite.  Nausea and vomiting.  Involuntary shaking or trembling of a body part (tremor).  Muscle weakness.  Tingling in the arms and legs.  Sudden tightening of muscles (muscle spasms).  Confusion.  Psychiatric issues, such as depression, irritability, or psychosis.  A feeling of fluttering of the heart.  Seizures. These symptoms are more severe   if magnesium levels drop suddenly. How is this diagnosed? This condition may be diagnosed based on:  Your symptoms and medical history.  A physical exam.  Blood and urine tests. How is this treated? Treatment depends on the cause and the severity of the condition. It may be treated with:  A magnesium supplement. This can be taken in pill form. If the condition is severe, magnesium is usually given through an IV.  Changes to your diet. You may be directed to eat foods that have a lot of magnesium, such as green leafy vegetables, peas, beans, and nuts.  Stopping any intake of alcohol.   Follow  these instructions at home:  Make sure that your diet includes foods with magnesium. Foods that have a lot of magnesium in them include: ? Green leafy vegetables, such as spinach and broccoli. ? Beans and peas. ? Nuts and seeds, such as almonds and sunflower seeds. ? Whole grains, such as whole grain bread and fortified cereals.  Take magnesium supplements if your health care provider tells you to do that. Take them as directed.  Take over-the-counter and prescription medicines only as told by your health care provider.  Have your magnesium levels monitored as told by your health care provider.  When you are active, drink fluids that contain electrolytes.  Avoid drinking alcohol.  Keep all follow-up visits as told by your health care provider. This is important.      Contact a health care provider if:  You get worse instead of better.  Your symptoms return. Get help right away if you:  Develop severe muscle weakness.  Have trouble breathing.  Feel that your heart is racing. Summary  Hypomagnesemia is a condition in which the level of magnesium in the blood is low.  Hypomagnesemia can affect every organ in the body.  Treatment may include eating more foods that contain magnesium, taking magnesium supplements, and not drinking alcohol.  Have your magnesium levels monitored as told by your health care provider. This information is not intended to replace advice given to you by your health care provider. Make sure you discuss any questions you have with your health care provider. Document Revised: 03/13/2020 Document Reviewed: 03/13/2020 Elsevier Patient Education  2021 Elsevier Inc.  

## 2020-11-13 NOTE — Progress Notes (Signed)
Entered chart in error.

## 2020-11-13 NOTE — Progress Notes (Signed)
Nutrition follow-up completed with patient during IV fluids.  He is receiving concurrent chemoradiation therapy for tonsil cancer. Patient reports he is having increased difficulty consuming adequate nutrition.  States he did not know this would be so difficult. Reports he calculated he only is able to consume about 1200 cal.  This is about 50% of his estimated nutrition needs. Weight was documented as 217.9 pounds on January 20.  This is decreased from 221.3 pounds January 13. Patient reports he has increased fatigue. Reports increasing taste alterations. Noted labs: Sodium 131, glucose 172, creatinine 1.43, and magnesium 1.5.  PEG is scheduled for January 28.  Patient requesting hope that this could be placed sooner.  Estimated nutrition needs: 2400-2600 cal, 120-140 g protein, 2.6 L fluid.  Nutrition diagnosis: Predicted suboptimal energy intake has evolved into inadequate oral intake related to tonsil cancer and associated treatments as evidenced by oral intake 50% of estimated needs.  Intervention: Reviewed strategies for ways to increase calories and protein. Encourage soft moist foods. Focus on Ensure complete, at least 4 cartons daily.  This would provide approximately 1400 cal and 120 cal grams protein. Questions answered.  Teach back method used.  Monitoring, evaluation, goals: Patient will tolerate increased calories and protein to minimize weight loss.  Next visit: Telephone follow-up on Wednesday, January 26.  **Disclaimer: This note was dictated with voice recognition software. Similar sounding words can inadvertently be transcribed and this note may contain transcription errors which may not have been corrected upon publication of note.**

## 2020-11-14 ENCOUNTER — Ambulatory Visit
Admission: RE | Admit: 2020-11-14 | Discharge: 2020-11-14 | Disposition: A | Discharge status: Home / Self Care | Payer: BC Managed Care – PPO | Source: Ambulatory Visit | Attending: Radiation Oncology | Admitting: Radiation Oncology

## 2020-11-14 DIAGNOSIS — F17210 Nicotine dependence, cigarettes, uncomplicated: Secondary | ICD-10-CM | POA: Diagnosis not present

## 2020-11-14 DIAGNOSIS — C099 Malignant neoplasm of tonsil, unspecified: Secondary | ICD-10-CM | POA: Diagnosis not present

## 2020-11-14 DIAGNOSIS — C090 Malignant neoplasm of tonsillar fossa: Secondary | ICD-10-CM | POA: Diagnosis not present

## 2020-11-14 NOTE — Progress Notes (Signed)
Oncology Nurse Navigator Documentation  I met with patient before his scheduled appointment with Garald Balding SLP today. He reports feeling fatigued from treatment. He is having pain when swallowing with some relief with pain medication. He is scheduled for PEG/PAC on 1/28 and I discussed that I would meet with him next week to provide education. He knows to call me if he has any questions or concerns.   Harlow Asa RN, BSN, OCN Head & Neck Oncology Nurse [place] [facility] Phone # [phone]  Fax # [phone]

## 2020-11-17 ENCOUNTER — Other Ambulatory Visit: Payer: Self-pay

## 2020-11-17 ENCOUNTER — Ambulatory Visit
Admission: RE | Admit: 2020-11-17 | Discharge: 2020-11-17 | Disposition: A | Discharge status: Home / Self Care | Payer: BC Managed Care – PPO | Source: Ambulatory Visit | Attending: Radiation Oncology | Admitting: Radiation Oncology

## 2020-11-17 DIAGNOSIS — C099 Malignant neoplasm of tonsil, unspecified: Secondary | ICD-10-CM | POA: Diagnosis not present

## 2020-11-17 DIAGNOSIS — C090 Malignant neoplasm of tonsillar fossa: Secondary | ICD-10-CM | POA: Diagnosis not present

## 2020-11-17 DIAGNOSIS — F17210 Nicotine dependence, cigarettes, uncomplicated: Secondary | ICD-10-CM | POA: Diagnosis not present

## 2020-11-17 DIAGNOSIS — F419 Anxiety disorder, unspecified: Secondary | ICD-10-CM | POA: Diagnosis not present

## 2020-11-18 ENCOUNTER — Encounter: Payer: BC Managed Care – PPO | Admitting: Nutrition

## 2020-11-18 ENCOUNTER — Ambulatory Visit
Admission: RE | Admit: 2020-11-18 | Discharge: 2020-11-18 | Disposition: A | Discharge status: Home / Self Care | Payer: BC Managed Care – PPO | Source: Ambulatory Visit | Attending: Radiation Oncology | Admitting: Radiation Oncology

## 2020-11-18 ENCOUNTER — Other Ambulatory Visit: Payer: BC Managed Care – PPO

## 2020-11-18 ENCOUNTER — Ambulatory Visit: Payer: BC Managed Care – PPO | Admitting: Hematology and Oncology

## 2020-11-18 ENCOUNTER — Ambulatory Visit: Payer: BC Managed Care – PPO

## 2020-11-18 DIAGNOSIS — C090 Malignant neoplasm of tonsillar fossa: Secondary | ICD-10-CM | POA: Diagnosis not present

## 2020-11-18 DIAGNOSIS — F17210 Nicotine dependence, cigarettes, uncomplicated: Secondary | ICD-10-CM | POA: Diagnosis not present

## 2020-11-18 DIAGNOSIS — C099 Malignant neoplasm of tonsil, unspecified: Secondary | ICD-10-CM | POA: Diagnosis not present

## 2020-11-19 ENCOUNTER — Ambulatory Visit
Admission: RE | Admit: 2020-11-19 | Discharge: 2020-11-19 | Disposition: A | Discharge status: Home / Self Care | Payer: BC Managed Care – PPO | Source: Ambulatory Visit | Attending: Radiation Oncology | Admitting: Radiation Oncology

## 2020-11-19 ENCOUNTER — Other Ambulatory Visit: Payer: Self-pay

## 2020-11-19 ENCOUNTER — Inpatient Hospital Stay: Payer: BC Managed Care – PPO | Admitting: Nutrition

## 2020-11-19 DIAGNOSIS — C099 Malignant neoplasm of tonsil, unspecified: Secondary | ICD-10-CM | POA: Diagnosis not present

## 2020-11-19 DIAGNOSIS — C090 Malignant neoplasm of tonsillar fossa: Secondary | ICD-10-CM | POA: Diagnosis not present

## 2020-11-19 DIAGNOSIS — F17210 Nicotine dependence, cigarettes, uncomplicated: Secondary | ICD-10-CM | POA: Diagnosis not present

## 2020-11-19 DIAGNOSIS — F418 Other specified anxiety disorders: Secondary | ICD-10-CM | POA: Diagnosis not present

## 2020-11-19 MED ORDER — OSMOLITE 1.5 CAL PO LIQD: ORAL | 0 refills | Status: DC

## 2020-11-19 NOTE — Progress Notes (Signed)
Telephone follow-up completed with patient who is receiving concurrent chemoradiation therapy for tonsil cancer. PEG planned for Friday, January 28 secondary to weight loss and inability to consume adequate nutrition. Patient reports consuming 3 cartons of Ensure plus daily.  He tries to drink milk.  He was able to tolerate pasta with Alfredo sauce and lettuce. He attributes decreased intake secondary to thickened saliva and severe taste alterations. Reports he still tries to get some activity every day. Nausea is controlled. No complaints regarding bowels.  Estimated nutrition needs: 2400-2600 cal, 120-140 g protein, 2.6 L fluid.  Tube Feeding Schedule/Goal: 2 cartons Osmolite 1.5, 2 times daily with 120 mL water before and after. (8:00 am and 8:00 pm) 1.5 cartons Osmolite 1.5, 2 times daily with 120 mL water before and after. (12:00 noon and 2:00 pm) 30 mL Prosource, no CHO 2 times daily. Mix with 30 mL water and flush with 210 mL water between tube feeding. (10:00 am and 2:00 pm)  Provides 2605 cal, 134 gm pro and 2707 mL free water/100% estimated needs.  Nutrition diagnosis: Inadequate oral intake continues.  Intervention: Educated patient to try to increase Ensure Plus to 5 cartons daily. Change milk to whole milk for added calories. Continue fruit smoothies as tolerated. Take nausea medication regularly as needed. Encouraged increased fluid intake. Nurse navigator scheduled to do tube feeding instruction on Thursday. Will provide bolus schedule for patient to begin over the weekend after feeding tube placed and okay for use. Tube feeding orders written and add [employer] [facility] notified.  Monitoring, evaluation, goals: Patient will tolerate adequate calories and protein to minimize weight loss.  Next visit: Thursday, February 3 during infusion.  **Disclaimer: This note was dictated with voice recognition software. Similar sounding words can inadvertently be transcribed and this  note may contain transcription errors which may not have been corrected upon publication of note.**

## 2020-11-20 ENCOUNTER — Other Ambulatory Visit: Payer: Self-pay

## 2020-11-20 ENCOUNTER — Encounter: Payer: BC Managed Care – PPO | Admitting: Nutrition

## 2020-11-20 ENCOUNTER — Ambulatory Visit
Admission: RE | Admit: 2020-11-20 | Discharge: 2020-11-20 | Disposition: A | Discharge status: Home / Self Care | Payer: BC Managed Care – PPO | Source: Ambulatory Visit | Attending: Radiation Oncology | Admitting: Radiation Oncology

## 2020-11-20 ENCOUNTER — Other Ambulatory Visit: Payer: Self-pay | Admitting: Radiology

## 2020-11-20 ENCOUNTER — Ambulatory Visit: Payer: BC Managed Care – PPO

## 2020-11-20 ENCOUNTER — Inpatient Hospital Stay: Payer: BC Managed Care – PPO

## 2020-11-20 ENCOUNTER — Encounter: Payer: Self-pay | Admitting: Hematology and Oncology

## 2020-11-20 ENCOUNTER — Inpatient Hospital Stay (HOSPITAL_BASED_OUTPATIENT_CLINIC_OR_DEPARTMENT_OTHER): Payer: BC Managed Care – PPO | Admitting: Hematology and Oncology

## 2020-11-20 ENCOUNTER — Ambulatory Visit: Payer: BC Managed Care – PPO | Admitting: Hematology and Oncology

## 2020-11-20 ENCOUNTER — Other Ambulatory Visit: Payer: BC Managed Care – PPO

## 2020-11-20 VITALS — BP 112/68 | HR 70 | Temp 98.1°F | Resp 17 | Ht 72.0 in | Wt 214.2 lb

## 2020-11-20 DIAGNOSIS — C090 Malignant neoplasm of tonsillar fossa: Secondary | ICD-10-CM

## 2020-11-20 DIAGNOSIS — R634 Abnormal weight loss: Secondary | ICD-10-CM | POA: Diagnosis not present

## 2020-11-20 DIAGNOSIS — E119 Type 2 diabetes mellitus without complications: Secondary | ICD-10-CM | POA: Diagnosis not present

## 2020-11-20 DIAGNOSIS — F17210 Nicotine dependence, cigarettes, uncomplicated: Secondary | ICD-10-CM | POA: Diagnosis not present

## 2020-11-20 DIAGNOSIS — Z5111 Encounter for antineoplastic chemotherapy: Secondary | ICD-10-CM | POA: Diagnosis not present

## 2020-11-20 DIAGNOSIS — Z794 Long term (current) use of insulin: Secondary | ICD-10-CM | POA: Diagnosis not present

## 2020-11-20 DIAGNOSIS — R5383 Other fatigue: Secondary | ICD-10-CM | POA: Diagnosis not present

## 2020-11-20 DIAGNOSIS — E860 Dehydration: Secondary | ICD-10-CM | POA: Diagnosis not present

## 2020-11-20 DIAGNOSIS — F419 Anxiety disorder, unspecified: Secondary | ICD-10-CM | POA: Diagnosis not present

## 2020-11-20 DIAGNOSIS — A630 Anogenital (venereal) warts: Secondary | ICD-10-CM | POA: Diagnosis not present

## 2020-11-20 DIAGNOSIS — K1230 Oral mucositis (ulcerative), unspecified: Secondary | ICD-10-CM | POA: Diagnosis not present

## 2020-11-20 DIAGNOSIS — C099 Malignant neoplasm of tonsil, unspecified: Secondary | ICD-10-CM | POA: Diagnosis not present

## 2020-11-20 DIAGNOSIS — N179 Acute kidney failure, unspecified: Secondary | ICD-10-CM | POA: Diagnosis not present

## 2020-11-20 DIAGNOSIS — Z7984 Long term (current) use of oral hypoglycemic drugs: Secondary | ICD-10-CM | POA: Diagnosis not present

## 2020-11-20 DIAGNOSIS — R432 Parageusia: Secondary | ICD-10-CM | POA: Diagnosis not present

## 2020-11-20 DIAGNOSIS — K5900 Constipation, unspecified: Secondary | ICD-10-CM | POA: Diagnosis not present

## 2020-11-20 LAB — BASIC METABOLIC PANEL
Anion gap: 6 (ref 5–15)
BUN: 14 mg/dL (ref 6–20)
CO2: 32 mmol/L (ref 22–32)
Calcium: 8.7 mg/dL — ABNORMAL LOW (ref 8.9–10.3)
Chloride: 98 mmol/L (ref 98–111)
Creatinine, Ser: 1.58 mg/dL — ABNORMAL HIGH (ref 0.61–1.24)
GFR, Estimated: 55 mL/min — ABNORMAL LOW (ref >60)
Glucose, Bld: 88 mg/dL (ref 70–99)
Potassium: 3.9 mmol/L (ref 3.5–5.1)
Sodium: 136 mmol/L (ref 135–145)

## 2020-11-20 LAB — CBC WITH DIFFERENTIAL (CANCER CENTER ONLY)
Abs Immature Granulocytes: 0.01 10*3/uL (ref 0.00–0.07)
Basophils Absolute: 0 10*3/uL (ref 0.0–0.1)
Basophils Relative: 1 %
Eosinophils Absolute: 0 10*3/uL (ref 0.0–0.5)
Eosinophils Relative: 1 %
HCT: 41.6 % (ref 39.0–52.0)
Hemoglobin: 14 g/dL (ref 13.0–17.0)
Immature Granulocytes: 0 %
Lymphocytes Relative: 16 %
Lymphs Abs: 0.7 10*3/uL (ref 0.7–4.0)
MCH: 30.8 pg (ref 26.0–34.0)
MCHC: 33.7 g/dL (ref 30.0–36.0)
MCV: 91.6 fL (ref 80.0–100.0)
Monocytes Absolute: 0.3 10*3/uL (ref 0.1–1.0)
Monocytes Relative: 8 %
Neutro Abs: 3.2 10*3/uL (ref 1.7–7.7)
Neutrophils Relative %: 74 %
Platelet Count: 148 10*3/uL — ABNORMAL LOW (ref 150–400)
RBC: 4.54 MIL/uL (ref 4.22–5.81)
RDW: 12.4 % (ref 11.5–15.5)
WBC Count: 4.3 10*3/uL (ref 4.0–10.5)
nRBC: 0 % (ref 0.0–0.2)

## 2020-11-20 LAB — MAGNESIUM: Magnesium: 1.8 mg/dL (ref 1.7–2.4)

## 2020-11-20 MED ORDER — HYDROCODONE-ACETAMINOPHEN 7.5-325 MG/15ML PO SOLN: 10.0000 mL | Freq: Four times a day (QID) | ORAL | 0 refills | Status: DC | PRN

## 2020-11-20 MED ORDER — MAGNESIUM CITRATE PO SOLN: 0.5000 | Freq: Two times a day (BID) | ORAL | 0 refills | Status: AC

## 2020-11-20 NOTE — Progress Notes (Signed)
[name] NOTE  Patient Care Team: Justin [name], NP as PCP - General (Internal Medicine) Justin Aloe, MD as Consulting Physician (Urology) Justin Quitter, MD as Consulting Physician (Otolaryngology) Justin Baldwin, RD as Dietitian (Nutrition)  CHIEF COMPLAINTS/PURPOSE OF CONSULTATION:  SCC tonsil  ASSESSMENT & PLAN:  No problem-specific Assessment & Plan notes found for this encounter.  No orders of the defined types were placed in this encounter.  This is a 45 yr old male patient with newly diagnosed T3( primary tumor according to TB discussion is borderline 4 cm), N1( multiple lymph nodes ipsilateral but PET pending) and M0/Stage II  referred to medical oncology for treatment consideration We reviewed his imaging, pathology and discussed possible options for treatment in TB today. He followed up with ENT team at [facility] for consideration of surgery.   Given bilateral neck disease, recommendation was to proceed with concurrent chemoradiation. Given young age and excellent PS at baseline with no significant comorbidities, I recommended that he consider cisplatin every 21-day dosing at 100 mg/m for 3 doses concurrent with radiation.  We have discussed about side effects including and not limited to fatigue, nausea, increased risk of infection, ototoxicity with occasional permanent hearing loss, nephrotoxicity which can be permanent in patients etc. He started radiation on 10/28/2020, chemo delayed because of COVID Received first dose of 100 mg/m2 cisplatin on 11/07/2020 Excellent response to chemo radiation so far.  2. Treatment induced mucositis,  He feels like MS contin has made him high and sleepy half of the day He prefers staying on hydrocodone for pain management, refilled this.  3. dysgeusia, weight loss.  PEG tomorrow. He has all his feeding supplies and will start supplementing.  4. Dehydration, will arrange for IV fluids tomorrow.  5.  Constipation, active bowel sounds, passing gas, abdominal exam normal Encouraged to try dulcolax suppository today, if no bowel movement by tomorrow, he will try Mag citrate tomorrow.  6. AKI, likely secondary to dehydration.  HISTORY OF PRESENTING ILLNESS:   Justin Baldwin 45 y.o. male is here because of new diagnosis of SCC tonsil/oropharynx.  HISTORY OF PRESENT ILLNESS:  Justin Baldwin is a 45 y.o. male who presented with ongoing head cold symptoms, sinus issues, left ear pain, and left-sided throat pain since September.  When the cold-like symptoms resolved, the throat and ear pain did not. Augmentin was prescribed without relief.  Subsequently, the patient saw Justin Baldwin, ENT at [facility], who noted that the left tonsil had a mass appearance on 09/19/2020. 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  Pertinent imaging thus far includes soft tissue neck CT scan on 09/30/2020 revealed a left tonsillar mass abutting the glossotonsillar sulcus and base of the tongue, consistent with known malignancy. Left level 2/3 cystic or necrotic adenopathy reflected nodal metastasis.  CT scan of chest performed on 09/30/2020 revealing a non-specific left lower lobe pulmonary micronodule but no acute intrathoracic abnormality and no definite intrathoracic metastasis.  I have personally reviewed his imaging and discussed him with Justin Baldwin.    He had a PET scan which showed hypermetabolic left tonsillar mass and bilateral lymphadenopathy consistent with metastatic disease.  Hence recommendation was to proceed with concurrent chemoradiation.  He started radiation on 10/28/2020 chemotherapy delayed to 11/07/2020 because of COVID-19 infection.   Nausea has been very well controlled so far. Sore throat, has been taking hydrocodone every 6 hours, started on MS Contin for long-acting pain  medication. He doesn't like morphine, his pain is well controlled on  hydrocodone. Anxiety well controlled with Ativan.  He is able to drink smoothies and liquids. He doesn't want to eat because he cant taste it, he can swallow fine. No change in breathing. Urine has been dark occasionally  Rest of the pertinent 10 point ROS reviewed and negative  MEDICAL HISTORY:  Past Medical History:  Diagnosis Date  . Diabetes mellitus without complication ([place])   . Erectile dysfunction   . Insomnia   . Skin abnormality    hidradenitis suppurativa     SURGICAL HISTORY: Past Surgical History:  Procedure Laterality Date  . IRRIGATION AND DEBRIDEMENT ABSCESS N/A 09/22/2018   Procedure: IRRIGATION AND DEBRIDEMENT ABSCESS;  Surgeon: Justin Ok, MD;  Location: [facility]S;  Service: General;  Laterality: N/A;    SOCIAL HISTORY: Social History   Socioeconomic History  . Marital status: Married    Spouse name: Not on file  . Number of children: Not on file  . Years of education: Not on file  . Highest education level: Not on file  Occupational History  . Not on file  Tobacco Use  . Smoking status: Former Smoker    Packs/day: 1.00    Types: Cigarettes    Quit date: 10/27/2020    Years since quitting: 0.0  . Smokeless tobacco: Former Systems developer    Quit date: 02/14/2016  Vaping Use  . Vaping Use: Never used  Substance and Sexual Activity  . Alcohol use: Not Currently    Alcohol/week: 5.0 standard drinks    Types: 5 Shots of liquor per week    Comment: occasinal   . Drug use: No  . Sexual activity: Yes  Other Topics Concern  . Not on file  Social History Narrative   ** Merged History Encounter **       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    Social Determinants of Health   Financial Resource Strain: Low Risk   . Difficulty of Paying Living Expenses: Not hard at all  Food Insecurity: No Food Insecurity  . Worried About Charity fundraiser in the Last Year: Never true  . Ran Out of Food in the Last Year:  Never true  Transportation Needs: No Transportation Needs  . Lack of Transportation (Medical): No  . Lack of Transportation (Non-Medical): No  Physical Activity: Not on file  Stress: Not on file  Social Connections: Not on file  Intimate Partner Violence: Not on file    FAMILY HISTORY: Family History  Problem Relation Age of Onset  . Heart attack Father   . Emphysema Paternal Grandmother   . Arthritis Mother     ALLERGIES:  has No Known Allergies.  MEDICATIONS:  Current Outpatient Medications  Medication Sig Dispense Refill  . baclofen (LIORESAL) 10 MG tablet Take 1 tablet (10 mg total) by mouth every 8 (eight) hours as needed (hiccups). 20 each 0  . blood glucose meter kit and supplies KIT Dispense based on patient and insurance preference. Use up to four times daily as directed. (FOR ICD-9 250.00, 250.01). 1 each 0  . dexamethasone (DECADRON) 4 MG tablet Take 2 tablets (8 mg total) by mouth daily. Take daily x 3 days starting the day after cisplatin chemotherapy. Take with food. 30 tablet 1  . fluticasone (FLONASE) 50 MCG/ACT nasal spray Place 2 sprays into both nostrils daily. 16 g 1  . glucose blood test strip Use three  times daily as instructed 100 each 3  . HYDROcodone-acetaminophen (HYCET) 7.5-325 mg/15 ml solution Take 10 mLs by mouth every 6 (six) hours as needed for moderate pain. 473 mL 0  . insulin glargine (LANTUS) 100 UNIT/ML injection Inject 0.1 mLs (10 Units total) into the skin daily. 10 mL 2  . lidocaine (XYLOCAINE) 2 % solution Patient: Mix 1part 2% viscous lidocaine, 1part H20. Swish & swallow 37m of diluted mixture, 362m before meals and at bedtime, up to QID 200 mL 4  . lidocaine-prilocaine (EMLA) cream Apply to affected area once 30 g 3  . LORazepam (ATIVAN) 0.5 MG tablet Take 1 tablet (0.5 mg total) by mouth every 8 (eight) hours as needed for anxiety (Nausea or vomiting, take 1-2 tabs as needed every 6 hrs for anxiety, nausea or vomiting). 45 tablet 0  .  metFORMIN (GLUCOPHAGE XR) 500 MG 24 hr tablet Take 1 tablet (500 mg total) by mouth daily with breakfast. For diabetes. 90 tablet 3  . morphine (MS CONTIN) 15 MG 12 hr tablet Take 1 tablet (15 mg total) by mouth every 12 (twelve) hours for 20 days. 40 tablet 0  . Nutritional Supplements (FEEDING SUPPLEMENT, OSMOLITE 1.5 CAL,) LIQD 2 cartons Osmolite 1.5 via PEG twice daily with 120 mL free water before and after.  1-1/2 cartons Osmolite 1.5 via PEG twice daily with 120 mL free water before and after.  Mix 30 mL Prosource no carb or equivalent with 30 mL water and flush with 210 mL water twice daily via PEG.  Provides 2605 cal, 134 g protein, 2707 mL free water/100% estimated needs.  0  . omeprazole (PRILOSEC) 20 MG capsule TAKE 1 CAPSULE BY MOUTH DAILY FOR HEARTBURN 90 capsule 3  . ondansetron (ZOFRAN) 8 MG tablet Take 1 tablet (8 mg total) by mouth 2 (two) times daily as needed. Start on the third day after cisplatin chemotherapy. 30 tablet 1  . oxymetazoline (AFRIN) 0.05 % nasal spray Place 1-2 sprays into both nostrils as needed for congestion.     . prochlorperazine (COMPAZINE) 10 MG tablet Take 1 tablet (10 mg total) by mouth every 6 (six) hours as needed (Nausea or vomiting). 30 tablet 1  . rosuvastatin (CRESTOR) 40 MG tablet Take 1 tablet (40 mg total) by mouth every evening. For cholesterol. 90 tablet 3  . scopolamine (TRANSDERM-SCOP) 1 MG/3DAYS Place 1 patch (1.5 mg total) onto the skin every 3 (three) days. 4 patch 5  . tadalafil (CIALIS) 5 MG tablet Take 1 tablet (5 mg total) by mouth daily as needed for erectile dysfunction. 90 tablet 0  . zolpidem (AMBIEN) 5 MG tablet Take 1 tablet (5 mg total) by mouth at bedtime as needed for sleep. 30 tablet 0   No current facility-administered medications for this visit.     PHYSICAL EXAMINATION: ECOG PERFORMANCE STATUS: 0 - Asymptomatic  Vitals:   11/20/20 1546  BP: 112/68  Pulse: 70  Resp: 17  Temp: 98.1 F (36.7 C)  SpO2: 97%   Filed  Weights   11/20/20 1546  Weight: 214 lb 3.2 oz (97.2 kg)    GENERAL:alert, no distress and comfortable SKIN: skin color, texture, turgor are normal, no rashes or significant lesions EYES: normal, conjunctiva are pink and non-injected, sclera clear Mucous membranes covered with exudate, no thrush NECK: supple, thyroid normal size, non-tender, without nodularity, tonsillar mass is significantly smaller. LYMPH: NO palpable LN LUNGS: clear to auscultation and percussion with normal breathing effort HEART: regular rate & rhythm and no  murmurs and no lower extremity edema ABDOMEN:abdomen soft, non-tender and normal bowel sounds, no guarding, rigidity Musculoskeletal:no cyanosis of digits and no clubbing  PSYCH: alert & oriented x 3 with fluent speech NEURO: no focal motor/sensory deficits  LABORATORY DATA:  I have reviewed the data as listed Lab Results  Component Value Date   WBC 4.3 11/20/2020   HGB 14.0 11/20/2020   HCT 41.6 11/20/2020   MCV 91.6 11/20/2020   PLT 148 (L) 11/20/2020     Chemistry      Component Value Date/Time   NA 136 11/20/2020 1516   NA 135 09/12/2019 1048   K 3.9 11/20/2020 1516   CL 98 11/20/2020 1516   CO2 32 11/20/2020 1516   BUN 14 11/20/2020 1516   BUN 18 09/12/2019 1048   CREATININE 1.58 (H) 11/20/2020 1516   CREATININE 1.43 (H) 11/13/2020 0816   CREATININE 1.38 (H) 11/30/2018 1102      Component Value Date/Time   CALCIUM 8.7 (L) 11/20/2020 1516   ALKPHOS 43 10/15/2020 1132   AST 35 10/15/2020 1132   ALT 79 (H) 10/15/2020 1132   BILITOT 0.8 10/15/2020 1132     I reviewed labs from today, will arrange for hydration and magnesium replacement  RADIOGRAPHIC STUDIES: I have personally reviewed the radiological images as listed and agreed with the findings in the report. No results found. Final Pathologic Diagnosis    TONSIL, BIOPSY:              Squamous cell carcinoma, HPV associated.  Electronically signed by Garnette Czech, MD on  09/25/2020 at 3:50 PM    All questions were answered. The patient knows to call the clinic with any problems, questions or concerns. I spent a total of 30 minutes in the care of this patient including history and physical, review of medical records, counseling and coordination of care.    Benay Pike, MD 11/20/2020 4:14 PM

## 2020-11-20 NOTE — Progress Notes (Signed)
Oncology Nurse Navigator Documentation  Met with Jerauld Bostwick to provide PEG/port education prior to tomorrow's  placement.  Provided port educational handout, showed example, provided guidance for post-surgical dsg removal, site care.  . Using  PEG teaching device   and Teach Back, provided education for PEG use and care, including: hand hygiene, gravity bolus administration of daily water flushes and nutritional supplement, fluids and medications; care of tube insertion site including daily dressing change and cleaning; S&S of infection.   . Mr. Deguire correctly verbalized procedures for and provided correct return demonstration of gravity administration of water, dressing change and site care.  . I provided written instructions for PEG flushing/dressing change in support of verbal instruction.   . I provided/described contents of Start of Care Bolus Feeding Kit (3 60 cc syringes, 2 boxes 4x4 drainage sponges, 1 package mesh briefs, 1 roll paper tape, 1 case Osmolite 1.5).  He voiced understanding he is to start using Osmolite per guidance of Nutrition. Marland Kitchen He understands I will be available for ongoing PEG support. Provided barium sulfate prep which I obtained from [facility] IR, reviewed instructions.   Harlow Asa RN, BSN, OCN Head & Neck Oncology Nurse [place] at [facility] Phone # [phone]  Fax # [phone]

## 2020-11-21 ENCOUNTER — Ambulatory Visit (HOSPITAL_COMMUNITY)
Admission: RE | Admit: 2020-11-21 | Discharge: 2020-11-21 | Disposition: A | Discharge status: Home / Self Care | Payer: BC Managed Care – PPO | Source: Ambulatory Visit | Attending: Hematology and Oncology | Admitting: Hematology and Oncology

## 2020-11-21 ENCOUNTER — Other Ambulatory Visit: Payer: Self-pay | Admitting: *Deleted

## 2020-11-21 ENCOUNTER — Ambulatory Visit
Admission: RE | Admit: 2020-11-21 | Discharge: 2020-11-21 | Disposition: A | Discharge status: Home / Self Care | Payer: BC Managed Care – PPO | Source: Ambulatory Visit | Attending: Radiation Oncology | Admitting: Radiation Oncology

## 2020-11-21 ENCOUNTER — Inpatient Hospital Stay: Payer: BC Managed Care – PPO

## 2020-11-21 ENCOUNTER — Encounter (HOSPITAL_COMMUNITY): Payer: Self-pay

## 2020-11-21 ENCOUNTER — Other Ambulatory Visit: Payer: Self-pay

## 2020-11-21 VITALS — BP 121/75 | HR 62 | Temp 98.5°F | Resp 16

## 2020-11-21 DIAGNOSIS — Z79899 Other long term (current) drug therapy: Secondary | ICD-10-CM | POA: Insufficient documentation

## 2020-11-21 DIAGNOSIS — A630 Anogenital (venereal) warts: Secondary | ICD-10-CM | POA: Diagnosis not present

## 2020-11-21 DIAGNOSIS — F17210 Nicotine dependence, cigarettes, uncomplicated: Secondary | ICD-10-CM | POA: Diagnosis not present

## 2020-11-21 DIAGNOSIS — E860 Dehydration: Secondary | ICD-10-CM | POA: Diagnosis not present

## 2020-11-21 DIAGNOSIS — E119 Type 2 diabetes mellitus without complications: Secondary | ICD-10-CM | POA: Insufficient documentation

## 2020-11-21 DIAGNOSIS — Z87891 Personal history of nicotine dependence: Secondary | ICD-10-CM | POA: Diagnosis not present

## 2020-11-21 DIAGNOSIS — C090 Malignant neoplasm of tonsillar fossa: Secondary | ICD-10-CM

## 2020-11-21 DIAGNOSIS — N179 Acute kidney failure, unspecified: Secondary | ICD-10-CM | POA: Diagnosis not present

## 2020-11-21 DIAGNOSIS — F419 Anxiety disorder, unspecified: Secondary | ICD-10-CM | POA: Diagnosis not present

## 2020-11-21 DIAGNOSIS — R432 Parageusia: Secondary | ICD-10-CM | POA: Diagnosis not present

## 2020-11-21 DIAGNOSIS — I700 Atherosclerosis of aorta: Secondary | ICD-10-CM | POA: Diagnosis not present

## 2020-11-21 DIAGNOSIS — R634 Abnormal weight loss: Secondary | ICD-10-CM | POA: Diagnosis not present

## 2020-11-21 DIAGNOSIS — K1230 Oral mucositis (ulcerative), unspecified: Secondary | ICD-10-CM | POA: Diagnosis not present

## 2020-11-21 DIAGNOSIS — Z7984 Long term (current) use of oral hypoglycemic drugs: Secondary | ICD-10-CM | POA: Insufficient documentation

## 2020-11-21 DIAGNOSIS — Z5111 Encounter for antineoplastic chemotherapy: Secondary | ICD-10-CM | POA: Diagnosis not present

## 2020-11-21 DIAGNOSIS — C099 Malignant neoplasm of tonsil, unspecified: Secondary | ICD-10-CM | POA: Diagnosis not present

## 2020-11-21 DIAGNOSIS — Z452 Encounter for adjustment and management of vascular access device: Secondary | ICD-10-CM | POA: Diagnosis not present

## 2020-11-21 DIAGNOSIS — R5383 Other fatigue: Secondary | ICD-10-CM | POA: Diagnosis not present

## 2020-11-21 DIAGNOSIS — Z794 Long term (current) use of insulin: Secondary | ICD-10-CM | POA: Diagnosis not present

## 2020-11-21 DIAGNOSIS — K5900 Constipation, unspecified: Secondary | ICD-10-CM | POA: Diagnosis not present

## 2020-11-21 HISTORY — PX: IR GASTROSTOMY TUBE MOD SED: IMG625

## 2020-11-21 HISTORY — PX: IR IMAGING GUIDED PORT INSERTION: IMG5740

## 2020-11-21 LAB — GLUCOSE, CAPILLARY: Glucose-Capillary: 75 mg/dL (ref 70–99)

## 2020-11-21 LAB — PROTIME-INR
INR: 1 (ref 0.8–1.2)
Prothrombin Time: 12.6 seconds (ref 11.4–15.2)

## 2020-11-21 MED ORDER — FENTANYL CITRATE (PF) 100 MCG/2ML IJ SOLN
INTRAMUSCULAR | Status: AC | PRN
  Administered 2020-11-21 (×6): 50 ug via INTRAVENOUS

## 2020-11-21 MED ORDER — MIDAZOLAM HCL 2 MG/2ML IJ SOLN
INTRAMUSCULAR | Status: AC
  Filled 2020-11-21: qty 4

## 2020-11-21 MED ORDER — LIDOCAINE-EPINEPHRINE 1 %-1:100000 IJ SOLN
INTRAMUSCULAR | Status: AC | PRN
  Administered 2020-11-21 (×2): 10 mL via INTRADERMAL

## 2020-11-21 MED ORDER — GLUCAGON HCL RDNA (DIAGNOSTIC) 1 MG IJ SOLR
INTRAMUSCULAR | Status: AC
  Filled 2020-11-21: qty 1

## 2020-11-21 MED ORDER — FENTANYL CITRATE (PF) 100 MCG/2ML IJ SOLN
INTRAMUSCULAR | Status: AC
  Filled 2020-11-21: qty 2

## 2020-11-21 MED ORDER — HEPARIN SOD (PORK) LOCK FLUSH 100 UNIT/ML IV SOLN
INTRAVENOUS | Status: AC | PRN
  Administered 2020-11-21: 500 [IU] via INTRAVENOUS

## 2020-11-21 MED ORDER — LIDOCAINE-EPINEPHRINE 1 %-1:100000 IJ SOLN
INTRAMUSCULAR | Status: AC
  Filled 2020-11-21: qty 1

## 2020-11-21 MED ORDER — LIDOCAINE HCL 1 % IJ SOLN
INTRAMUSCULAR | Status: AC
  Filled 2020-11-21: qty 20

## 2020-11-21 MED ORDER — MIDAZOLAM HCL 2 MG/2ML IJ SOLN
INTRAMUSCULAR | Status: AC
  Filled 2020-11-21: qty 2

## 2020-11-21 MED ORDER — CEFAZOLIN SODIUM-DEXTROSE 2-4 GM/100ML-% IV SOLN
INTRAVENOUS | Status: AC
  Filled 2020-11-21: qty 100

## 2020-11-21 MED ORDER — HEPARIN SOD (PORK) LOCK FLUSH 100 UNIT/ML IV SOLN
INTRAVENOUS | Status: AC
  Filled 2020-11-21: qty 5

## 2020-11-21 MED ORDER — SODIUM CHLORIDE 0.9 % IV SOLN
INTRAVENOUS | Status: AC
  Filled 2020-11-21: qty 250

## 2020-11-21 MED ORDER — SODIUM CHLORIDE 0.9 % IV SOLN: INTRAVENOUS | Status: DC

## 2020-11-21 MED ORDER — CEFAZOLIN SODIUM-DEXTROSE 2-4 GM/100ML-% IV SOLN
2.0000 g | INTRAVENOUS | Status: AC
  Administered 2020-11-21: 2 g via INTRAVENOUS

## 2020-11-21 MED ORDER — GLUCAGON HCL (RDNA) 1 MG IJ SOLR
INTRAMUSCULAR | Status: AC | PRN
  Administered 2020-11-21: .5 mg via INTRAVENOUS

## 2020-11-21 MED ORDER — ONDANSETRON HCL 4 MG/2ML IJ SOLN: 4.0000 mg | INTRAMUSCULAR | Status: DC | PRN

## 2020-11-21 MED ORDER — MIDAZOLAM HCL 2 MG/2ML IJ SOLN
INTRAMUSCULAR | Status: AC | PRN
Start: 2020-11-21 — End: 2020-11-21
  Administered 2020-11-21 (×5): 1 mg via INTRAVENOUS
  Administered 2020-11-21: 2 mg via INTRAVENOUS
  Administered 2020-11-21 (×2): 1 mg via INTRAVENOUS

## 2020-11-21 NOTE — Sedation Documentation (Signed)
Gastrostomy tube placement procedure begun. 

## 2020-11-21 NOTE — Patient Instructions (Signed)
Rehydration, Adult Rehydration is the replacement of body fluids, salts, and minerals (electrolytes) that are lost during dehydration. Dehydration is when there is not enough water or other fluids in the body. This happens when you lose more fluids than you take in. Common causes of dehydration include:  Not drinking enough fluids. This can occur when you are ill or doing activities that require a lot of energy, especially in hot weather.  Conditions that cause loss of water or other fluids, such as diarrhea, vomiting, sweating, or urinating a lot.  Other illnesses, such as fever or infection.  Certain medicines, such as those that remove excess fluid from the body (diuretics). Symptoms of mild or moderate dehydration may include thirst, dry lips and mouth, and dizziness. Symptoms of severe dehydration may include increased heart rate, confusion, fainting, and not urinating. For severe dehydration, you may need to get fluids through an IV at the hospital. For mild or moderate dehydration, you can usually rehydrate at home by drinking certain fluids as told by your health care provider. What are the risks? Generally, rehydration is safe. However, taking in too much fluid (overhydration) can be a problem. This is rare. Overhydration can cause an electrolyte imbalance, kidney failure, or a decrease in salt (sodium) levels in the body. Supplies needed You will need an oral rehydration solution (ORS) if your health care provider tells you to use one. This is a drink to treat dehydration. It can be found in pharmacies and retail stores. How to rehydrate Fluids Follow instructions from your health care provider for rehydration. The kind of fluid and the amount you should drink depend on your condition. In general, you should choose drinks that you prefer.  If told by your health care provider, drink an ORS. ? Make an ORS by following instructions on the package. ? Start by drinking small amounts,  about  cup (120 mL) every 5-10 minutes. ? Slowly increase how much you drink until you have taken the amount recommended by your health care provider.  Drink enough clear fluids to keep your urine pale yellow. If you were told to drink an ORS, finish it first, then start slowly drinking other clear fluids. Drink fluids such as: ? Water. This includes sparkling water and flavored water. Drinking only water can lead to having too little sodium in your body (hyponatremia). Follow the advice of your health care provider. ? Water from ice chips you suck on. ? Fruit juice with water you add to it (diluted). ? Sports drinks. ? Hot or cold herbal teas. ? Broth-based soups. ? Milk or milk products. Food Follow instructions from your health care provider about what to eat while you rehydrate. Your health care provider may recommend that you slowly begin eating regular foods in small amounts.  Eat foods that contain a healthy balance of electrolytes, such as bananas, oranges, potatoes, tomatoes, and spinach.  Avoid foods that are greasy or contain a lot of sugar. In some cases, you may get nutrition through a feeding tube that is passed through your nose and into your stomach (nasogastric tube, or NG tube). This may be done if you have uncontrolled vomiting or diarrhea.   Beverages to avoid Certain beverages may make dehydration worse. While you rehydrate, avoid drinking alcohol.   How to tell if you are recovering from dehydration You may be recovering from dehydration if:  You are urinating more often than before you started rehydrating.  Your urine is pale yellow.  Your energy level   improves.  You vomit less frequently.  You have diarrhea less frequently.  Your appetite improves or returns to normal.  You feel less dizzy or less light-headed.  Your skin tone and color start to look more normal. Follow these instructions at home:  Take over-the-counter and prescription medicines only  as told by your health care provider.  Do not take sodium tablets. Doing this can lead to having too much sodium in your body (hypernatremia). Contact a health care provider if:  You continue to have symptoms of mild or moderate dehydration, such as: ? Thirst. ? Dry lips. ? Slightly dry mouth. ? Dizziness. ? Dark urine or less urine than normal. ? Muscle cramps.  You continue to vomit or have diarrhea. Get help right away if you:  Have symptoms of dehydration that get worse.  Have a fever.  Have a severe headache.  Have been vomiting and the following happens: ? Your vomiting gets worse or does not go away. ? Your vomit includes blood or green matter (bile). ? You cannot eat or drink without vomiting.  Have problems with urination or bowel movements, such as: ? Diarrhea that gets worse or does not go away. ? Blood in your stool (feces). This may cause stool to look black and tarry. ? Not urinating, or urinating only a small amount of very dark urine, within 6-8 hours.  Have trouble breathing.  Have symptoms that get worse with treatment. These symptoms may represent a serious problem that is an emergency. Do not wait to see if the symptoms will go away. Get medical help right away. Call your local emergency services (911 in the [place] Do not drive yourself to the hospital. Summary  Rehydration is the replacement of body fluids and minerals (electrolytes) that are lost during dehydration.  Follow instructions from your health care provider for rehydration. The kind of fluid and amount you should drink depend on your condition.  Slowly increase how much you drink until you have taken the amount recommended by your health care provider.  Contact your health care provider if you continue to show signs of mild or moderate dehydration. This information is not intended to replace advice given to you by your health care provider. Make sure you discuss any questions you have with  your health care provider. Document Revised: 12/12/2019 Document Reviewed: 10/22/2019 Elsevier Patient Education  2021 [employer]  

## 2020-11-21 NOTE — Sedation Documentation (Addendum)
Port a cath placement complete. Patient prepped for Gastrostomy tube placement. 

## 2020-11-21 NOTE — Progress Notes (Addendum)
Contacted Dr. Annamaria Boots on discharge time.  stated can d/c at 4pm. Low wall suction connected to G Tube per order. Pt. Lethargic but arousable. Reviewed all post IR instructions wit wife Cyril Mourning via phone.

## 2020-11-21 NOTE — Procedures (Signed)
Interventional Radiology Procedure Note  Procedure:   RT IJ POWER PORT  20 FR GTUBE    Complications: None  Estimated Blood Loss:  MIN  Findings: PORT TIP SVCRA  GTUBE CONFIRMED IN THE STOMACH     M. Daryll Brod, MD

## 2020-11-21 NOTE — H&P (Addendum)
Chief Complaint: Patient was seen in consultation today for port-a-catheter and gastrostomy tube placements  Referring Physician(s): Iruku, Praveena  Supervising Physician: Daryll Brod  Patient Status: [facility] - Out-pt  History of Present Illness: Justin Baldwin is a 45 y.o. 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 He has been started on chemotherapy and radiation with some delays due to Covid-19 infection (10/27/20). He has developed treatment-induced mucositis and has had a poor appetite with weight loss.    Interventional Radiology has been asked to evaluate this patient for image-guided port-a-catheter and gastrostomy tube placements to facilitate his treatment goals and nutritional needs.  Past Medical History:  Diagnosis Date  . Diabetes mellitus without complication ([place])   . Erectile dysfunction   . Insomnia   . Skin abnormality    hidradenitis suppurativa     Past Surgical History:  Procedure Laterality Date  . IRRIGATION AND DEBRIDEMENT ABSCESS N/A 09/22/2018   Procedure: IRRIGATION AND DEBRIDEMENT ABSCESS;  Surgeon: Ralene Ok, MD;  Location: [facility] ORS;  Service: General;  Laterality: N/A;    Allergies: Patient has no known allergies.  Medications: Prior to Admission medications   Medication Sig Start Date End Date Taking? Authorizing Provider  baclofen (LIORESAL) 10 MG tablet Take 1 tablet (10 mg total) by mouth every 8 (eight) hours as needed (hiccups). 11/09/20   Truitt Merle, MD  blood glucose meter kit and supplies KIT Dispense based on patient and insurance preference. Use up to four times daily as directed. (FOR ICD-9 250.00, 250.01). 12/10/19   Pleas [name], NP  dexamethasone (DECADRON) 4 MG tablet Take 2 tablets (8 mg total) by mouth daily. Take daily x 3 days starting the day after cisplatin chemotherapy. Take with food. 10/14/20   Benay Pike, MD  fluticasone (FLONASE) 50 MCG/ACT nasal spray Place 2 sprays into both nostrils daily. 08/11/20   Elby Beck, FNP  glucose blood test strip Use three times daily as instructed 09/23/19   Pleas [name], NP  HYDROcodone-acetaminophen (HYCET) 7.5-325 mg/15 ml solution Take 10 mLs by mouth every 6 (six) hours as needed for moderate pain. 11/20/20   Benay Pike, MD  insulin glargine (LANTUS) 100 UNIT/ML injection Inject 0.1 mLs (10 Units total) into the skin daily. 09/10/20   Pleas [name], NP  lidocaine (XYLOCAINE) 2 % solution Patient: Mix 1part 2% viscous lidocaine, 1part H20. Swish & swallow 76m of diluted mixture, 327m before meals and at bedtime, up to QID 11/03/20   SqEppie GibsonMD  lidocaine-prilocaine (EMLA) cream Apply to affected area once 10/14/20   IrBenay PikeMD  LORazepam (ATIVAN) 0.5 MG tablet Take 1 tablet (0.5 mg total) by mouth every 8 (eight) hours as needed for anxiety (Nausea or vomiting, take 1-2 tabs as needed every 6 hrs for anxiety, nausea or vomiting). 11/07/20   IrBenay PikeMD  magnesium citrate SOLN Take 148 mLs (0.5 Bottles total) by mouth every 12 (twelve) hours for 2 doses. If no response to other laxatives. 11/20/20 11/21/20  IrBenay PikeMD  metFORMIN (GLUCOPHAGE XR) 500 MG 24 hr tablet Take 1 tablet (500 mg total) by mouth daily with breakfast. For diabetes. 06/27/20   ClPleas KochNP  morphine (MS CONTIN) 15 MG 12 hr tablet Take 1 tablet (15 mg total) by mouth every 12 (twelve) hours for 20 days. 11/12/20 12/02/20  IrBenay PikeMD  Nutritional Supplements (FEEDING SUPPLEMENT, OSMOLITE 1.5  CAL,) LIQD 2 cartons Osmolite 1.5 via PEG twice daily with 120 mL free water before and after.  1-1/2 cartons Osmolite 1.5 via PEG twice daily with 120 mL free water before and after.  Mix 30 mL Prosource no carb or equivalent with 30 mL water and flush with 210 mL water twice daily via PEG.  Provides 2605 cal, 134 g protein, 2707 mL  free water/100% estimated needs. 11/19/20   Eppie Gibson, MD  omeprazole (PRILOSEC) 20 MG capsule TAKE 1 CAPSULE BY MOUTH DAILY FOR HEARTBURN 06/16/20   Pleas [name], NP  ondansetron (ZOFRAN) 8 MG tablet Take 1 tablet (8 mg total) by mouth 2 (two) times daily as needed. Start on the third day after cisplatin chemotherapy. 10/14/20   Benay Pike, MD  oxymetazoline (AFRIN) 0.05 % nasal spray Place 1-2 sprays into both nostrils as needed for congestion.     [provider]  prochlorperazine (COMPAZINE) 10 MG tablet Take 1 tablet (10 mg total) by mouth every 6 (six) hours as needed (Nausea or vomiting). 10/14/20   Benay Pike, MD  rosuvastatin (CRESTOR) 40 MG tablet Take 1 tablet (40 mg total) by mouth every evening. For cholesterol. 06/17/20   Pleas [name], NP  scopolamine (TRANSDERM-SCOP) 1 MG/3DAYS Place 1 patch (1.5 mg total) onto the skin every 3 (three) days. 11/12/20   Eppie Gibson, MD  tadalafil (CIALIS) 5 MG tablet Take 1 tablet (5 mg total) by mouth daily as needed for erectile dysfunction. 12/10/19   Pleas [name], NP  zolpidem (AMBIEN) 5 MG tablet Take 1 tablet (5 mg total) by mouth at bedtime as needed for sleep. 10/21/20   Benay Pike, MD     Family History  Problem Relation Age of Onset  . Heart attack Father   . Emphysema Paternal Grandmother   . Arthritis Mother     Social History   Socioeconomic History  . Marital status: Married    Spouse name: Not on file  . Number of children: Not on file  . Years of education: Not on file  . Highest education level: Not on file  Occupational History  . Not on file  Tobacco Use  . Smoking status: Former Smoker    Packs/day: 1.00    Types: Cigarettes    Quit date: 10/27/2020    Years since quitting: 0.0  . Smokeless tobacco: Former Systems developer    Quit date: 02/14/2016  Vaping Use  . Vaping Use: Never used  Substance and Sexual Activity  . Alcohol use: Not Currently    Alcohol/week: 5.0 standard drinks     Types: 5 Shots of liquor per week    Comment: occasinal   . Drug use: No  . Sexual activity: Yes  Other Topics Concern  . Not on file  Social History Narrative   ** Merged History Encounter **       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    Social Determinants of Health   Financial Resource Strain: Low Risk   . Difficulty of Paying Living Expenses: Not hard at all  Food Insecurity: No Food Insecurity  . Worried About Charity fundraiser in the Last Year: Never true  . Ran Out of Food in the Last Year: Never true  Transportation Needs: No Transportation Needs  . Lack of Transportation (Medical): No  . Lack of Transportation (Non-Medical): No  Physical Activity: Not on file  Stress: Not on file  Social Connections:  Not on file    Review of Systems: A 12 point ROS discussed and pertinent positives are indicated in the HPI above.  All other systems are negative.  Review of Systems  Constitutional: Positive for appetite change. Negative for fatigue.  HENT: Positive for sore throat.        Radiation  Respiratory: Negative for cough and shortness of breath.   Cardiovascular: Negative for chest pain and leg swelling.  Gastrointestinal: Negative for abdominal pain, diarrhea, nausea and vomiting.  Neurological: Negative for dizziness and headaches.    Vital Signs: Temp: 98.2, BP 120/65, HR 68, RR 16, O2 Sats 100% on room air  Physical Exam Constitutional:      General: He is not in acute distress. HENT:     Mouth/Throat:     Mouth: Mucous membranes are dry.     Comments: Tongue coated white. Lower face and neck red from radiation therapy Cardiovascular:     Rate and Rhythm: Normal rate and regular rhythm.     Pulses: Normal pulses.     Heart sounds: Normal heart sounds.  Pulmonary:     Effort: Pulmonary effort is normal.     Breath sounds: Normal breath sounds.  Abdominal:     General: Bowel sounds are normal.      Palpations: Abdomen is soft.  Skin:    General: Skin is warm and dry.  Neurological:     Mental Status: He is alert and oriented to person, place, and time.  Psychiatric:        Mood and Affect: Mood normal.        Behavior: Behavior normal.        Thought Content: Thought content normal.        Judgment: Judgment normal.     Imaging: No results found.  Labs:  CBC: Recent Labs    10/27/20 1015 11/06/20 1431 11/13/20 0816 11/20/20 1516  WBC 4.4 8.5 6.6 4.3  HGB 18.8* 17.8* 15.5 14.0  HCT 55.3* 52.0 44.7 41.6  PLT 136* 248 169 148*    COAGS: Recent Labs    11/21/20 1007  INR 1.0    BMP: Recent Labs    10/27/20 1015 11/06/20 1431 11/13/20 0816 11/20/20 1516  NA 134* 138 131* 136  K 4.9 4.7 4.1 3.9  CL 96* 100 91* 98  CO2 30 31 33* 32  GLUCOSE 279* 127* 172* 88  BUN _0 CALCIUM 9.3 9.2 8.8* 8.7*  CREATININE 1.40* 1.43* 1.43* 1.58*  GFRNONAA >60 >60 >60 55*    LIVER FUNCTION TESTS: Recent Labs    12/10/19 0953 06/16/20 0914 10/15/20 1132  BILITOT 0.6 0.8 0.8  AST 21 22 35  ALT 23 23 79*  ALKPHOS 27* 28* 43  PROT 7.1 7.3 7.9  ALBUMIN 4.2 4.1 4.1    TUMOR MARKERS: No results for input(s): AFPTM, CEA, CA199, CHROMGRNA in the last 8760 hours.  Assessment and Plan:  Squamous cell carcinoma of the left tonsil/oropharynx: Justin Baldwin, 45 year old male, presents today to the [facility] department for image-guided port-a-catheter and gastrostomy tube placements.  Risks and benefits of image-guided Port-a-catheter placement were discussed with the patient including, but not limited to bleeding, infection, pneumothorax, or fibrin sheath development and need for additional procedures.  Risks and benefits of image-guided gastrostomy tube placement were discussed with the patient including, but not limited to the need for a barium enema during the procedure, bleeding, infection, peritonitis and/or damage to adjacent  structures.  All of the patient's questions were answered, patient is agreeable to proceed.  The patient has been NPO. Labs and vitals have been reviewed. Consent signed and in chart.  Thank you for this interesting consult.  I greatly enjoyed meeting Justin Baldwin and look forward to participating in their care.  A copy of this report was sent to the requesting provider on this date.  Electronically Signed: Soyla Dryer, AGACNP-BC [phone] 11/21/2020, 11:26 AM   I spent a total of  30 Minutes   in face to face in clinical consultation, greater than 50% of which was counseling/coordinating care for port-a-catheter and gastrostomy tube placements.

## 2020-11-21 NOTE — Sedation Documentation (Signed)
Port a cath placement procedure begun. 

## 2020-11-21 NOTE — Discharge Instructions (Signed)
For questions /concerns may call Interventional Radiology at [phone]  You may remove your dressing and shower tomorrow afternoon (port dressing)  DO NOT use EMLA cream for 2 weeks after port placement as the cream will remove surgical glue on your incision.      Gastrostomy Tube Home Guide, Adult A gastrostomy tube, or G-tube, is a tube that is inserted through the abdomen into the stomach. The tube is used to give feedings and medicines when a person cannot eat and drink enough on his or her own or take medicines by mouth. How to care for the insertion site Supplies needed:  Saline solution or clean, warm water and soap. Saline solution is made of salt and water.  Cotton swab or gauze.  Pre-cut gauze bandage (dressing) and tape, if needed. Instructions Follow these steps daily to clean the insertion site: 1. Wash your hands with soap and water for at least 20 seconds. 2. Remove the dressing (if there is one) that is between the person's skin and the tube. 3. Check the area where the tube enters the skin. Check daily for problems such as: ? Redness, rash, or irritation. ? Swelling. ? Pus-like drainage. ? Extra skin growth. 4. Moisten the cotton swab or gauze with the saline solution or with a soap-and-water mixture. Gently clean around the insertion site. Remove any drainage or crusted material. ? When the G-tube is first put in, a normal saline solution or water can be used to clean the skin. ? After the skin around the tube has healed, mild soap and water may be used. 5. Apply a dressing (if there should be one) between the person's skin and the tube.   How to flush a G-tube Flush the G-tube regularly to keep it from clogging. Flush it before and after feedings and as often as told by the health care provider. Supplies needed:  Purified or germ-free (sterile) water, warmed.  Container with lid for boiling water, if needed.  60 cc G-tube syringe. Instructions Before you  begin, decide whether to use sterile water or purified drinking water.  Use only sterile water if: ? The person has a weak disease-fighting (immune) system. ? The person has trouble fighting off infections (is immunocompromised). ? You are unsure about the amount of chemical contaminants in purified or drinking water.  Use purified drinking water in all other cases. To purify drinking water by boiling: ? Boil water for at least 1 minute. Keep lid over water while it boils. ? Let water cool to room temperature before using. Follow these steps to flush the G-tube: 1. Wash your hands with soap and water for at least 20 seconds. 2. Bring out (draw up) 30 mL of warm water in a syringe. 3. Connect the syringe to the tube. 4. Slowly and gently push the water into the tube. General tips  If the tube comes out: ? Cover the opening with a clean dressing and tape. ? Get help right away.  If there is skin or scar tissue growing where the tube enters the skin: ? Keep the area clean and dry. ? Secure the tube with tape so that the tube does not move around too much.  If the tube gets clogged: ? Slowly push warm water into the tube with a large syringe. ? Do not force the fluid into the tube or push an object into the tube. ? Get help right away if you cannot unclog the tube. Follow these instructions at home: Feedings  Give feedings at room temperature.  If feedings are continuous: ? Do not put more than 4 hours' worth of feedings in the feeding bag. ? Stop the feedings when you need to give medicine or flush the tube. Be sure to restart the feedings. ? Make sure the person's head is above his or her stomach (upright position). This will prevent choking and discomfort.  Make sure the person is in the right position during and after feedings. ? During feedings, have the person in the upright position. ? After a non-continuous feeding (bolus feeding), have the person stay in the upright  position for 1 hour.  Cover and place unused feedings in the refrigerator.  Replace feeding bags and syringes as told. Good hygiene  Make sure the person takes good care of his or her mouth and teeth (oral hygiene), such as by brushing his or her teeth.  Keep the area where the tube enters the skin clean and dry. General instructions  Use syringes made only for G-tubes.  Do not pull or put tension on the tube.  Before you remove the tube cap or disconnect a syringe, close the tube by using a clamp (clamping) or bending (kinking) the tube.  Measure the length of the G-tube every day from the insertion site to the end of the tube.  If the person's G-tube has a balloon, check the fluid in the balloon every week. Check the manufacturer's specifications to find the amount of fluid that should be in the balloon.  Remove excess air from the G-tube as told. This is called venting.  Do not push feedings, medicines, or flushes fast. Contact a health care provider if:  The person with the tube has constipation or a fever.  A large amount of fluid or mucus-like liquid is leaking from the tube.  Skin or scar tissue appears to be growing where the tube enters the skin.  The length of tube from the insertion site to the G-tube gets longer. Get help right away if:  The person with the tube has any of these problems: ? Severe pain, tenderness, or bloating in the abdomen. ? Nausea or vomiting. ? Trouble breathing or shortness of breath.  Any of these problems happen in the area where the tube enters the skin: ? Redness, irritation, swelling, or soreness. ? Pus-like discharge. ? A bad smell.  The tube is clogged and cannot be flushed.  The tube comes out. The tube will need to put back in within 4 hours. Summary  A gastrostomy tube, or G-tube, is a tube that is inserted through the abdomen into the stomach. The tube is used to give feedings and medicines when a person cannot eat and  drink enough on his or her own or cannot take medicine by mouth.  Check and clean the insertion site daily as told by the person's health care provider.  Flush the G-tube regularly to keep it from clogging. Flush it before and after feedings and as often as told.  Keep the area where the tube enters the skin clean and dry. This information is not intended to replace advice given to you by your health care provider. Make sure you discuss any questions you have with your health care provider.   Implanted Port Insertion, Care After This sheet gives you information about how to care for yourself after your procedure. Your health care provider may also give you more specific instructions. If you have problems or questions, contact your health care provider.  What can I expect after the procedure? After the procedure, it is common to have:  Discomfort at the port insertion site.  Bruising on the skin over the port. This should improve over 3-4 days. Follow these instructions at home: [facility] care  After your port is placed, you will get a manufacturer's information card. The card has information about your port. Keep this card with you at all times.  Take care of the port as told by your health care provider. Ask your health care provider if you or a family member can get training for taking care of the port at home. A home health care nurse may also take care of the port.  Make sure to remember what type of port you have. Incision care  Follow instructions from your health care provider about how to take care of your port insertion site. Make sure you: ? Wash your hands with soap and water before and after you change your bandage (dressing). If soap and water are not available, use hand sanitizer. ? Change your dressing as told by your health care provider. ? Leave stitches (sutures), skin glue, or adhesive strips in place. These skin closures may need to stay in place for 2 weeks or longer. If  adhesive strip edges start to loosen and curl up, you may trim the loose edges. Do not remove adhesive strips completely unless your health care provider tells you to do that.  Check your port insertion site every day for signs of infection. Check for: ? Redness, swelling, or pain. ? Fluid or blood. ? Warmth. ? Pus or a bad smell.      Activity  Return to your normal activities as told by your health care provider. Ask your health care provider what activities are safe for you.  Do not lift anything that is heavier than 10 lb (4.5 kg), or the limit that you are told, until your health care provider says that it is safe. General instructions  Take over-the-counter and prescription medicines only as told by your health care provider.  Do not take baths, swim, or use a hot tub until your health care provider approves. Ask your health care provider if you may take showers. You may only be allowed to take sponge baths.  Do not drive for 24 hours if you were given a sedative during your procedure.  Wear a medical alert bracelet in case of an emergency. This will tell any health care providers that you have a port.  Keep all follow-up visits as told by your health care provider. This is important. Contact a health care provider if:  You cannot flush your port with saline as directed, or you cannot draw blood from the port.  You have a fever or chills.  You have redness, swelling, or pain around your port insertion site.  You have fluid or blood coming from your port insertion site.  Your port insertion site feels warm to the touch.  You have pus or a bad smell coming from the port insertion site. Get help right away if:  You have chest pain or shortness of breath.  You have bleeding from your port that you cannot control. Summary  Take care of the port as told by your health care provider. Keep the manufacturer's information card with you at all times.  Change your dressing as  told by your health care provider.  Contact a health care provider if you have a fever or chills or if you  have redness, swelling, or pain around your port insertion site.  Keep all follow-up visits as told by your health care provider. This information is not intended to replace advice given to you by your health care provider. Make sure you discuss any questions you have with your health care provider.   Moderate Conscious Sedation, Adult, Care After This sheet gives you information about how to care for yourself after your procedure. Your health care provider may also give you more specific instructions. If you have problems or questions, contact your health care provider. What can I expect after the procedure? After the procedure, it is common to have:  Sleepiness for several hours.  Impaired judgment for several hours.  Difficulty with balance.  Vomiting if you eat too soon. Follow these instructions at home: For the time period you were told by your health care provider:  Rest.  Do not participate in activities where you could fall or become injured.  Do not drive or use machinery.  Do not drink alcohol.  Do not take sleeping pills or medicines that cause drowsiness.  Do not make important decisions or sign legal documents.  Do not take care of children on your own.      Eating and drinking  Follow the diet recommended by your health care provider.  Drink enough fluid to keep your urine pale yellow.  If you vomit: ? Drink water, juice, or soup when you can drink without vomiting. ? Make sure you have little or no nausea before eating solid foods.   General instructions  Take over-the-counter and prescription medicines only as told by your health care provider.  Have a responsible adult stay with you for the time you are told. It is important to have someone help care for you until you are awake and alert.  Do not smoke.  Keep all follow-up visits as told  by your health care provider. This is important. Contact a health care provider if:  You are still sleepy or having trouble with balance after 24 hours.  You feel light-headed.  You keep feeling nauseous or you keep vomiting.  You develop a rash.  You have a fever.  You have redness or swelling around the IV site. Get help right away if:  You have trouble breathing.  You have new-onset confusion at home. Summary  After the procedure, it is common to feel sleepy, have impaired judgment, or feel nauseous if you eat too soon.  Rest after you get home. Know the things you should not do after the procedure.  Follow the diet recommended by your health care provider and drink enough fluid to keep your urine pale yellow.  Get help right away if you have trouble breathing or new-onset confusion at home. This information is not intended to replace advice given to you by your health care provider. Make sure you discuss any questions you have with your health care provider. Document Revised: 02/08/2020 Document Reviewed: 09/06/2019 Elsevier Patient Education  2021 [employer].

## 2020-11-21 NOTE — Progress Notes (Signed)
saline

## 2020-11-24 ENCOUNTER — Other Ambulatory Visit: Payer: Self-pay | Admitting: Radiation Oncology

## 2020-11-24 ENCOUNTER — Other Ambulatory Visit: Payer: Self-pay

## 2020-11-24 ENCOUNTER — Other Ambulatory Visit: Payer: Self-pay | Admitting: Hematology and Oncology

## 2020-11-24 ENCOUNTER — Ambulatory Visit
Admission: RE | Admit: 2020-11-24 | Discharge: 2020-11-24 | Disposition: A | Discharge status: Home / Self Care | Payer: BC Managed Care – PPO | Source: Ambulatory Visit | Attending: Radiation Oncology | Admitting: Radiation Oncology

## 2020-11-24 DIAGNOSIS — F17210 Nicotine dependence, cigarettes, uncomplicated: Secondary | ICD-10-CM | POA: Diagnosis not present

## 2020-11-24 DIAGNOSIS — C090 Malignant neoplasm of tonsillar fossa: Secondary | ICD-10-CM

## 2020-11-24 DIAGNOSIS — C099 Malignant neoplasm of tonsil, unspecified: Secondary | ICD-10-CM | POA: Diagnosis not present

## 2020-11-24 MED ORDER — FLUCONAZOLE 100 MG PO TABS: ORAL_TABLET | ORAL | 0 refills | Status: DC

## 2020-11-24 MED ORDER — ONDANSETRON HCL 8 MG PO TABS: 8.0000 mg | ORAL_TABLET | Freq: Two times a day (BID) | ORAL | 1 refills | Status: DC | PRN

## 2020-11-24 MED ORDER — PROCHLORPERAZINE MALEATE 10 MG PO TABS: 10.0000 mg | ORAL_TABLET | Freq: Four times a day (QID) | ORAL | 1 refills | Status: DC | PRN

## 2020-11-24 MED ORDER — BACLOFEN 10 MG PO TABS: 10.0000 mg | ORAL_TABLET | Freq: Three times a day (TID) | ORAL | 0 refills | Status: DC | PRN

## 2020-11-24 MED ORDER — LORAZEPAM 0.5 MG PO TABS: 0.5000 mg | ORAL_TABLET | Freq: Three times a day (TID) | ORAL | 0 refills | Status: DC | PRN

## 2020-11-24 NOTE — Progress Notes (Signed)
Refilled ativan, zofran and compazine Since baclofen is PRN for hiccups, we will ask him if he is having ongoing hiccups again to fill this.  Thanks,

## 2020-11-25 ENCOUNTER — Ambulatory Visit: Payer: BC Managed Care – PPO | Admitting: Hematology and Oncology

## 2020-11-25 ENCOUNTER — Encounter: Payer: BC Managed Care – PPO | Admitting: Nutrition

## 2020-11-25 ENCOUNTER — Ambulatory Visit: Payer: BC Managed Care – PPO

## 2020-11-25 ENCOUNTER — Other Ambulatory Visit: Payer: Self-pay

## 2020-11-25 ENCOUNTER — Ambulatory Visit
Admission: RE | Admit: 2020-11-25 | Discharge: 2020-11-25 | Disposition: A | Discharge status: Home / Self Care | Payer: BC Managed Care – PPO | Source: Ambulatory Visit | Attending: Radiation Oncology | Admitting: Radiation Oncology

## 2020-11-25 ENCOUNTER — Other Ambulatory Visit: Payer: BC Managed Care – PPO

## 2020-11-25 DIAGNOSIS — C090 Malignant neoplasm of tonsillar fossa: Secondary | ICD-10-CM | POA: Diagnosis not present

## 2020-11-26 ENCOUNTER — Ambulatory Visit
Admission: RE | Admit: 2020-11-26 | Discharge: 2020-11-26 | Disposition: A | Discharge status: Home / Self Care | Payer: BC Managed Care – PPO | Source: Ambulatory Visit | Attending: Radiation Oncology | Admitting: Radiation Oncology

## 2020-11-26 ENCOUNTER — Other Ambulatory Visit: Payer: Self-pay

## 2020-11-26 DIAGNOSIS — C090 Malignant neoplasm of tonsillar fossa: Secondary | ICD-10-CM | POA: Diagnosis not present

## 2020-11-26 DIAGNOSIS — C099 Malignant neoplasm of tonsil, unspecified: Secondary | ICD-10-CM | POA: Diagnosis not present

## 2020-11-26 DIAGNOSIS — F17210 Nicotine dependence, cigarettes, uncomplicated: Secondary | ICD-10-CM | POA: Diagnosis not present

## 2020-11-27 ENCOUNTER — Other Ambulatory Visit: Payer: Self-pay

## 2020-11-27 ENCOUNTER — Other Ambulatory Visit: Payer: Self-pay | Admitting: Hematology and Oncology

## 2020-11-27 ENCOUNTER — Inpatient Hospital Stay: Payer: BC Managed Care – PPO

## 2020-11-27 ENCOUNTER — Ambulatory Visit
Admission: RE | Admit: 2020-11-27 | Discharge: 2020-11-27 | Disposition: A | Discharge status: Home / Self Care | Payer: BC Managed Care – PPO | Source: Ambulatory Visit | Attending: Radiation Oncology | Admitting: Radiation Oncology

## 2020-11-27 ENCOUNTER — Encounter: Payer: Self-pay | Admitting: Hematology and Oncology

## 2020-11-27 ENCOUNTER — Inpatient Hospital Stay (HOSPITAL_BASED_OUTPATIENT_CLINIC_OR_DEPARTMENT_OTHER): Payer: BC Managed Care – PPO | Admitting: Hematology and Oncology

## 2020-11-27 ENCOUNTER — Inpatient Hospital Stay: Payer: BC Managed Care – PPO | Attending: Hematology and Oncology

## 2020-11-27 ENCOUNTER — Inpatient Hospital Stay: Payer: BC Managed Care – PPO | Admitting: Nutrition

## 2020-11-27 VITALS — BP 125/70 | HR 73 | Temp 98.1°F | Resp 18 | Ht 72.0 in | Wt 214.4 lb

## 2020-11-27 DIAGNOSIS — C090 Malignant neoplasm of tonsillar fossa: Secondary | ICD-10-CM | POA: Diagnosis not present

## 2020-11-27 DIAGNOSIS — F17210 Nicotine dependence, cigarettes, uncomplicated: Secondary | ICD-10-CM | POA: Diagnosis not present

## 2020-11-27 DIAGNOSIS — E860 Dehydration: Secondary | ICD-10-CM | POA: Insufficient documentation

## 2020-11-27 DIAGNOSIS — K5900 Constipation, unspecified: Secondary | ICD-10-CM | POA: Diagnosis not present

## 2020-11-27 DIAGNOSIS — Z8616 Personal history of COVID-19: Secondary | ICD-10-CM | POA: Diagnosis not present

## 2020-11-27 DIAGNOSIS — Z87891 Personal history of nicotine dependence: Secondary | ICD-10-CM | POA: Insufficient documentation

## 2020-11-27 DIAGNOSIS — Z931 Gastrostomy status: Secondary | ICD-10-CM | POA: Diagnosis not present

## 2020-11-27 DIAGNOSIS — Z224 Carrier of infections with a predominantly sexual mode of transmission: Secondary | ICD-10-CM | POA: Insufficient documentation

## 2020-11-27 DIAGNOSIS — Z5111 Encounter for antineoplastic chemotherapy: Secondary | ICD-10-CM | POA: Diagnosis not present

## 2020-11-27 DIAGNOSIS — D701 Agranulocytosis secondary to cancer chemotherapy: Secondary | ICD-10-CM | POA: Insufficient documentation

## 2020-11-27 DIAGNOSIS — K1231 Oral mucositis (ulcerative) due to antineoplastic therapy: Secondary | ICD-10-CM | POA: Diagnosis not present

## 2020-11-27 DIAGNOSIS — N179 Acute kidney failure, unspecified: Secondary | ICD-10-CM | POA: Insufficient documentation

## 2020-11-27 DIAGNOSIS — C099 Malignant neoplasm of tonsil, unspecified: Secondary | ICD-10-CM | POA: Insufficient documentation

## 2020-11-27 DIAGNOSIS — Z79891 Long term (current) use of opiate analgesic: Secondary | ICD-10-CM | POA: Diagnosis not present

## 2020-11-27 DIAGNOSIS — Z95828 Presence of other vascular implants and grafts: Secondary | ICD-10-CM

## 2020-11-27 DIAGNOSIS — Z79899 Other long term (current) drug therapy: Secondary | ICD-10-CM | POA: Insufficient documentation

## 2020-11-27 LAB — MAGNESIUM: Magnesium: 1.5 mg/dL — ABNORMAL LOW (ref 1.7–2.4)

## 2020-11-27 LAB — CBC WITH DIFFERENTIAL/PLATELET
Abs Immature Granulocytes: 0.01 10*3/uL (ref 0.00–0.07)
Basophils Absolute: 0 10*3/uL (ref 0.0–0.1)
Basophils Relative: 1 %
Eosinophils Absolute: 0 10*3/uL (ref 0.0–0.5)
Eosinophils Relative: 2 %
HCT: 42.7 % (ref 39.0–52.0)
Hemoglobin: 14.2 g/dL (ref 13.0–17.0)
Immature Granulocytes: 1 %
Lymphocytes Relative: 20 %
Lymphs Abs: 0.3 10*3/uL — ABNORMAL LOW (ref 0.7–4.0)
MCH: 30.5 pg (ref 26.0–34.0)
MCHC: 33.3 g/dL (ref 30.0–36.0)
MCV: 91.8 fL (ref 80.0–100.0)
Monocytes Absolute: 0.3 10*3/uL (ref 0.1–1.0)
Monocytes Relative: 20 %
Neutro Abs: 0.9 10*3/uL — ABNORMAL LOW (ref 1.7–7.7)
Neutrophils Relative %: 56 %
Platelets: 193 10*3/uL (ref 150–400)
RBC: 4.65 MIL/uL (ref 4.22–5.81)
RDW: 12.6 % (ref 11.5–15.5)
WBC: 1.7 10*3/uL — ABNORMAL LOW (ref 4.0–10.5)
nRBC: 0 % (ref 0.0–0.2)

## 2020-11-27 LAB — BASIC METABOLIC PANEL
Anion gap: 7 (ref 5–15)
BUN: 9 mg/dL (ref 6–20)
CO2: 31 mmol/L (ref 22–32)
Calcium: 8.9 mg/dL (ref 8.9–10.3)
Chloride: 100 mmol/L (ref 98–111)
Creatinine, Ser: 1.37 mg/dL — ABNORMAL HIGH (ref 0.61–1.24)
GFR, Estimated: >60 mL/min (ref >60)
Glucose, Bld: 99 mg/dL (ref 70–99)
Potassium: 4.8 mmol/L (ref 3.5–5.1)
Sodium: 138 mmol/L (ref 135–145)

## 2020-11-27 MED ORDER — MAGNESIUM SULFATE 2 GM/50ML IV SOLN
2.0000 g | Freq: Once | INTRAVENOUS | Status: AC
  Administered 2020-11-27: 2 g via INTRAVENOUS

## 2020-11-27 MED ORDER — SODIUM CHLORIDE 0.9 % IV SOLN
Freq: Once | INTRAVENOUS | Status: AC
  Filled 2020-11-27: qty 250

## 2020-11-27 MED ORDER — SODIUM CHLORIDE 0.9% FLUSH
10.0000 mL | Freq: Once | INTRAVENOUS | Status: AC
  Administered 2020-11-27: 10 mL via INTRAVENOUS
  Filled 2020-11-27: qty 10

## 2020-11-27 MED ORDER — HEPARIN SOD (PORK) LOCK FLUSH 100 UNIT/ML IV SOLN
500.0000 [IU] | Freq: Once | INTRAVENOUS | Status: AC
  Administered 2020-11-27: 500 [IU] via INTRAVENOUS
  Filled 2020-11-27: qty 5

## 2020-11-27 MED ORDER — MORPHINE SULFATE 20 MG/5ML PO SOLN
5.0000 mg | ORAL | 0 refills | Status: DC | PRN
Start: 2020-11-27 — End: 2020-11-27

## 2020-11-27 MED ORDER — MAGNESIUM SULFATE 2 GM/50ML IV SOLN
INTRAVENOUS | Status: AC
  Filled 2020-11-27: qty 50

## 2020-11-27 NOTE — Patient Instructions (Signed)
Rehydration, Adult Rehydration is the replacement of body fluids, salts, and minerals (electrolytes) that are lost during dehydration. Dehydration is when there is not enough water or other fluids in the body. This happens when you lose more fluids than you take in. Common causes of dehydration include:  Not drinking enough fluids. This can occur when you are ill or doing activities that require a lot of energy, especially in hot weather.  Conditions that cause loss of water or other fluids, such as diarrhea, vomiting, sweating, or urinating a lot.  Other illnesses, such as fever or infection.  Certain medicines, such as those that remove excess fluid from the body (diuretics). Symptoms of mild or moderate dehydration may include thirst, dry lips and mouth, and dizziness. Symptoms of severe dehydration may include increased heart rate, confusion, fainting, and not urinating. For severe dehydration, you may need to get fluids through an IV at the hospital. For mild or moderate dehydration, you can usually rehydrate at home by drinking certain fluids as told by your health care provider. What are the risks? Generally, rehydration is safe. However, taking in too much fluid (overhydration) can be a problem. This is rare. Overhydration can cause an electrolyte imbalance, kidney failure, or a decrease in salt (sodium) levels in the body. Supplies needed You will need an oral rehydration solution (ORS) if your health care provider tells you to use one. This is a drink to treat dehydration. It can be found in pharmacies and retail stores. How to rehydrate Fluids Follow instructions from your health care provider for rehydration. The kind of fluid and the amount you should drink depend on your condition. In general, you should choose drinks that you prefer.  If told by your health care provider, drink an ORS. ? Make an ORS by following instructions on the package. ? Start by drinking small amounts,  about  cup (120 mL) every 5-10 minutes. ? Slowly increase how much you drink until you have taken the amount recommended by your health care provider.  Drink enough clear fluids to keep your urine pale yellow. If you were told to drink an ORS, finish it first, then start slowly drinking other clear fluids. Drink fluids such as: ? Water. This includes sparkling water and flavored water. Drinking only water can lead to having too little sodium in your body (hyponatremia). Follow the advice of your health care provider. ? Water from ice chips you suck on. ? Fruit juice with water you add to it (diluted). ? Sports drinks. ? Hot or cold herbal teas. ? Broth-based soups. ? Milk or milk products. Food Follow instructions from your health care provider about what to eat while you rehydrate. Your health care provider may recommend that you slowly begin eating regular foods in small amounts.  Eat foods that contain a healthy balance of electrolytes, such as bananas, oranges, potatoes, tomatoes, and spinach.  Avoid foods that are greasy or contain a lot of sugar. In some cases, you may get nutrition through a feeding tube that is passed through your nose and into your stomach (nasogastric tube, or NG tube). This may be done if you have uncontrolled vomiting or diarrhea.   Beverages to avoid Certain beverages may make dehydration worse. While you rehydrate, avoid drinking alcohol.   How to tell if you are recovering from dehydration You may be recovering from dehydration if:  You are urinating more often than before you started rehydrating.  Your urine is pale yellow.  Your energy level   improves.  You vomit less frequently.  You have diarrhea less frequently.  Your appetite improves or returns to normal.  You feel less dizzy or less light-headed.  Your skin tone and color start to look more normal. Follow these instructions at home:  Take over-the-counter and prescription medicines only  as told by your health care provider.  Do not take sodium tablets. Doing this can lead to having too much sodium in your body (hypernatremia). Contact a health care provider if:  You continue to have symptoms of mild or moderate dehydration, such as: ? Thirst. ? Dry lips. ? Slightly dry mouth. ? Dizziness. ? Dark urine or less urine than normal. ? Muscle cramps.  You continue to vomit or have diarrhea. Get help right away if you:  Have symptoms of dehydration that get worse.  Have a fever.  Have a severe headache.  Have been vomiting and the following happens: ? Your vomiting gets worse or does not go away. ? Your vomit includes blood or green matter (bile). ? You cannot eat or drink without vomiting.  Have problems with urination or bowel movements, such as: ? Diarrhea that gets worse or does not go away. ? Blood in your stool (feces). This may cause stool to look black and tarry. ? Not urinating, or urinating only a small amount of very dark urine, within 6-8 hours.  Have trouble breathing.  Have symptoms that get worse with treatment. These symptoms may represent a serious problem that is an emergency. Do not wait to see if the symptoms will go away. Get medical help right away. Call your local emergency services (911 in the [place] Do not drive yourself to the hospital. Summary  Rehydration is the replacement of body fluids and minerals (electrolytes) that are lost during dehydration.  Follow instructions from your health care provider for rehydration. The kind of fluid and amount you should drink depend on your condition.  Slowly increase how much you drink until you have taken the amount recommended by your health care provider.  Contact your health care provider if you continue to show signs of mild or moderate dehydration. This information is not intended to replace advice given to you by your health care provider. Make sure you discuss any questions you have with  your health care provider. Document Revised: 12/12/2019 Document Reviewed: 10/22/2019 Elsevier Patient Education  2021 Elsevier Inc.   Hypomagnesemia Hypomagnesemia is a condition in which the level of magnesium in the blood is low. Magnesium is a mineral that is found in many foods. It is used in many different processes in the body. Hypomagnesemia can affect every organ in the body. In severe cases, it can cause life-threatening problems. What are the causes? This condition may be caused by:  Not getting enough magnesium in your diet.  Malnutrition.  Problems with absorbing magnesium from the intestines.  Dehydration.  Alcohol abuse.  Vomiting.  Severe or chronic diarrhea.  Some medicines, including medicines that make you urinate more (diuretics).  Certain diseases, such as kidney disease, diabetes, celiac disease, and overactive thyroid. What are the signs or symptoms? Symptoms of this condition include:  Loss of appetite.  Nausea and vomiting.  Involuntary shaking or trembling of a body part (tremor).  Muscle weakness.  Tingling in the arms and legs.  Sudden tightening of muscles (muscle spasms).  Confusion.  Psychiatric issues, such as depression, irritability, or psychosis.  A feeling of fluttering of the heart.  Seizures. These symptoms are more severe   if magnesium levels drop suddenly. How is this diagnosed? This condition may be diagnosed based on:  Your symptoms and medical history.  A physical exam.  Blood and urine tests. How is this treated? Treatment depends on the cause and the severity of the condition. It may be treated with:  A magnesium supplement. This can be taken in pill form. If the condition is severe, magnesium is usually given through an IV.  Changes to your diet. You may be directed to eat foods that have a lot of magnesium, such as green leafy vegetables, peas, beans, and nuts.  Stopping any intake of alcohol.   Follow  these instructions at home:  Make sure that your diet includes foods with magnesium. Foods that have a lot of magnesium in them include: ? Green leafy vegetables, such as spinach and broccoli. ? Beans and peas. ? Nuts and seeds, such as almonds and sunflower seeds. ? Whole grains, such as whole grain bread and fortified cereals.  Take magnesium supplements if your health care provider tells you to do that. Take them as directed.  Take over-the-counter and prescription medicines only as told by your health care provider.  Have your magnesium levels monitored as told by your health care provider.  When you are active, drink fluids that contain electrolytes.  Avoid drinking alcohol.  Keep all follow-up visits as told by your health care provider. This is important.      Contact a health care provider if:  You get worse instead of better.  Your symptoms return. Get help right away if you:  Develop severe muscle weakness.  Have trouble breathing.  Feel that your heart is racing. Summary  Hypomagnesemia is a condition in which the level of magnesium in the blood is low.  Hypomagnesemia can affect every organ in the body.  Treatment may include eating more foods that contain magnesium, taking magnesium supplements, and not drinking alcohol.  Have your magnesium levels monitored as told by your health care provider. This information is not intended to replace advice given to you by your health care provider. Make sure you discuss any questions you have with your health care provider. Document Revised: 03/13/2020 Document Reviewed: 03/13/2020 Elsevier Patient Education  2021 Elsevier Inc.  

## 2020-11-27 NOTE — Progress Notes (Signed)
[place] NOTE  Patient Care Team: Pleas [name], NP as PCP - General (Internal Medicine) Festus Aloe, MD as Consulting Physician (Urology) Melida Quitter, MD as Consulting Physician (Otolaryngology) Karie Mainland, RD as Dietitian (Nutrition)  CHIEF COMPLAINTS/PURPOSE OF CONSULTATION:  SCC tonsil  ASSESSMENT & PLAN:  No problem-specific Assessment & Plan notes found for this encounter.  No orders of the defined types were placed in this encounter.  This is a 45 yr old male patient with newly diagnosed T3( primary tumor according to TB discussion is borderline 4 cm), N1( multiple lymph nodes ipsilateral but PET pending) and M0/Stage II  referred to medical oncology for treatment consideration We reviewed his imaging, pathology and discussed possible options for treatment in TB today. He followed up with ENT team at [facility] for consideration of surgery.   Given bilateral neck disease, recommendation was to proceed with concurrent chemoradiation. Given young age and excellent PS at baseline with no significant comorbidities, I recommended that he consider cisplatin every 21-day dosing at 100 mg/m for 3 doses concurrent with radiation.  We have discussed about side effects including and not limited to fatigue, nausea, increased risk of infection, ototoxicity with occasional permanent hearing loss, nephrotoxicity which can be permanent in patients etc. He started radiation on 10/28/2020, chemo delayed because of COVID Received first dose of 100 mg/m2 cisplatin on 11/07/2020 Excellent response to chemo radiation so far. Chemo planned for today but ANC of 900, so we will have to post pone it to next week. Repeat labs on Monday and anticipate C2 of cisplatin next week.  2. Treatment induced mucositis,  He wanted to stay on Hydrocodone last week without morphine, but today he says the pain is unbearable and he wants to try liquid morphine, will prescribe  this.  3. dysgeusia, weight loss.  He is using his PEG tube 3 of osmolite a day, eats soup and pasta, may be about two meals a day.  4. Dehydration will arrange for fluids and magnesium today.  5. Constipation, active bowel sounds, passing gas, abdominal exam normal Encouraged to try dulcolax suppository today, if no bowel movement by tomorrow, he will try Mag citrate tomorrow.  6. AKI, likely secondary to dehydration.  HISTORY OF PRESENTING ILLNESS:   Justin Baldwin 45 y.o. male is here because of new diagnosis of SCC tonsil/oropharynx.  HISTORY OF PRESENT ILLNESS:  Justin Baldwin is a 45 y.o. male who presented with ongoing head cold symptoms, sinus issues, left ear pain, and left-sided throat pain since September.  When the cold-like symptoms resolved, the throat and ear pain did not. Augmentin was prescribed without relief.  Subsequently, the patient saw Dr. Redmond Baseman, ENT at [facility], who noted that the left tonsil had a mass appearance on 09/19/2020. 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  Pertinent imaging thus far includes soft tissue neck CT scan on 09/30/2020 revealed a left tonsillar mass abutting the glossotonsillar sulcus and base of the tongue, consistent with known malignancy. Left level 2/3 cystic or necrotic adenopathy reflected nodal metastasis.  CT scan of chest performed on 09/30/2020 revealing a non-specific left lower lobe pulmonary micronodule but no acute intrathoracic abnormality and no definite intrathoracic metastasis.  I have personally reviewed his imaging and discussed him with Dr. Redmond Baseman.    He had a PET scan which showed hypermetabolic left tonsillar mass and bilateral lymphadenopathy consistent with metastatic disease.  Hence recommendation was to  proceed with concurrent chemoradiation.  He started radiation on 10/28/2020 chemotherapy delayed to 11/07/2020 because of COVID-19 infection.   He is here for a follow-up  before cycle 2 of cisplatin chemotherapy.  He tells me that since Monday his pain has become unbearable, hydrocodone has not been helping and he wants to try the morphine again.  Caudal suppositories.  If he does not take anything for constipation then he is not having a good bowel movement.  He is able to eat past x2, using PEG tube, he has been taking about 3 bottles of Osmolite via PEG.  No nausea or vomiting.  Taste has been completely altered.  No fevers or chills.  No change in urination, hearing loss or new neuropathy.  Rest of the pertinent 10 point ROS reviewed and negative  MEDICAL HISTORY:  Past Medical History:  Diagnosis Date  . Diabetes mellitus without complication ([place])   . Erectile dysfunction   . Insomnia   . Skin abnormality    hidradenitis suppurativa     SURGICAL HISTORY: Past Surgical History:  Procedure Laterality Date  . IR GASTROSTOMY TUBE MOD SED  11/21/2020  . IR IMAGING GUIDED PORT INSERTION  11/21/2020  . IRRIGATION AND DEBRIDEMENT ABSCESS N/A 09/22/2018   Procedure: IRRIGATION AND DEBRIDEMENT ABSCESS;  Surgeon: Ralene Ok, MD;  Location: [facility] ORS;  Service: General;  Laterality: N/A;    SOCIAL HISTORY: Social History   Socioeconomic History  . Marital status: Married    Spouse name: Not on file  . Number of children: Not on file  . Years of education: Not on file  . Highest education level: Not on file  Occupational History  . Not on file  Tobacco Use  . Smoking status: Former Smoker    Packs/day: 1.00    Types: Cigarettes    Quit date: 10/27/2020    Years since quitting: 0.0  . Smokeless tobacco: Former Systems developer    Quit date: 02/14/2016  Vaping Use  . Vaping Use: Never used  Substance and Sexual Activity  . Alcohol use: Not Currently    Alcohol/week: 5.0 standard drinks    Types: 5 Shots of liquor per week    Comment: occasinal   . Drug use: No  . Sexual activity: Yes  Other Topics Concern  . Not on file  Social History Narrative   **  Merged History Encounter **       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    Social Determinants of Health   Financial Resource Strain: Low Risk   . Difficulty of Paying Living Expenses: Not hard at all  Food Insecurity: No Food Insecurity  . Worried About Charity fundraiser in the Last Year: Never true  . Ran Out of Food in the Last Year: Never true  Transportation Needs: No Transportation Needs  . Lack of Transportation (Medical): No  . Lack of Transportation (Non-Medical): No  Physical Activity: Not on file  Stress: Not on file  Social Connections: Not on file  Intimate Partner Violence: Not on file    FAMILY HISTORY: Family History  Problem Relation Age of Onset  . Heart attack Father   . Emphysema Paternal Grandmother   . Arthritis Mother     ALLERGIES:  has No Known Allergies.  MEDICATIONS:  Current Outpatient Medications  Medication Sig Dispense Refill  . baclofen (LIORESAL) 10 MG tablet Take 1 tablet (10 mg total) by mouth every 8 (eight) hours as  needed (hiccups). 20 each 0  . blood glucose meter kit and supplies KIT Dispense based on patient and insurance preference. Use up to four times daily as directed. (FOR ICD-9 250.00, 250.01). 1 each 0  . dexamethasone (DECADRON) 4 MG tablet Take 2 tablets (8 mg total) by mouth daily. Take daily x 3 days starting the day after cisplatin chemotherapy. Take with food. 30 tablet 1  . fluconazole (DIFLUCAN) 100 MG tablet Take 2 tablets today, and 1 tablet daily for 6 more days. Hold Rosuvastatin while on this. 8 tablet 0  . fluticasone (FLONASE) 50 MCG/ACT nasal spray Place 2 sprays into both nostrils daily. 16 g 1  . glucose blood test strip Use three times daily as instructed 100 each 3  . HYDROcodone-acetaminophen (HYCET) 7.5-325 mg/15 ml solution Take 10 mLs by mouth every 6 (six) hours as needed for moderate pain. 473 mL 0  . insulin glargine (LANTUS) 100 UNIT/ML injection Inject 0.1  mLs (10 Units total) into the skin daily. 10 mL 2  . lidocaine (XYLOCAINE) 2 % solution Patient: Mix 1part 2% viscous lidocaine, 1part H20. Swish & swallow 22m of diluted mixture, 351m before meals and at bedtime, up to QID 200 mL 4  . lidocaine-prilocaine (EMLA) cream Apply to affected area once 30 g 3  . LORazepam (ATIVAN) 0.5 MG tablet Take 1 tablet (0.5 mg total) by mouth every 8 (eight) hours as needed for anxiety (Nausea or vomiting, take 1-2 tabs as needed every 6 hrs for anxiety, nausea or vomiting). 45 tablet 0  . metFORMIN (GLUCOPHAGE XR) 500 MG 24 hr tablet Take 1 tablet (500 mg total) by mouth daily with breakfast. For diabetes. 90 tablet 3  . Nutritional Supplements (FEEDING SUPPLEMENT, OSMOLITE 1.5 CAL,) LIQD 2 cartons Osmolite 1.5 via PEG twice daily with 120 mL free water before and after.  1-1/2 cartons Osmolite 1.5 via PEG twice daily with 120 mL free water before and after.  Mix 30 mL Prosource no carb or equivalent with 30 mL water and flush with 210 mL water twice daily via PEG.  Provides 2605 cal, 134 g protein, 2707 mL free water/100% estimated needs.  0  . omeprazole (PRILOSEC) 20 MG capsule TAKE 1 CAPSULE BY MOUTH DAILY FOR HEARTBURN 90 capsule 3  . ondansetron (ZOFRAN) 8 MG tablet Take 1 tablet (8 mg total) by mouth 2 (two) times daily as needed. Start on the third day after cisplatin chemotherapy. 30 tablet 1  . oxymetazoline (AFRIN) 0.05 % nasal spray Place 1-2 sprays into both nostrils as needed for congestion.     . prochlorperazine (COMPAZINE) 10 MG tablet Take 1 tablet (10 mg total) by mouth every 6 (six) hours as needed (Nausea or vomiting). 30 tablet 1  . rosuvastatin (CRESTOR) 40 MG tablet Take 1 tablet (40 mg total) by mouth every evening. For cholesterol. 90 tablet 3  . scopolamine (TRANSDERM-SCOP) 1 MG/3DAYS Place 1 patch (1.5 mg total) onto the skin every 3 (three) days. 4 patch 5  . tadalafil (CIALIS) 5 MG tablet Take 1 tablet (5 mg total) by mouth daily as  needed for erectile dysfunction. 90 tablet 0  . zolpidem (AMBIEN) 5 MG tablet Take 1 tablet (5 mg total) by mouth at bedtime as needed for sleep. 30 tablet 0  . morphine (MS CONTIN) 15 MG 12 hr tablet Take 1 tablet (15 mg total) by mouth every 12 (twelve) hours for 20 days. (Patient not taking: Reported on 11/27/2020) 40 tablet 0   No  current facility-administered medications for this visit.     PHYSICAL EXAMINATION: ECOG PERFORMANCE STATUS: 0 - Asymptomatic  Vitals:   11/27/20 0813  BP: 125/70  Pulse: 73  Resp: 18  Temp: 98.1 F (36.7 C)  SpO2: 96%   Filed Weights   11/27/20 0813  Weight: 214 lb 6.4 oz (97.3 kg)    GENERAL:alert, no distress and comfortable SKIN: skin color, texture, turgor are normal, no rashes or significant lesions EYES: normal, conjunctiva are pink and non-injected, sclera clear Mucous membranes covered with exudate, mucositis noted. NECK: supple, thyroid normal size, tender, skin changes from ongoing radiation. LYMPH: NO palpable LN LUNGS: clear to auscultation and percussion with normal breathing effort HEART: regular rate & rhythm and no murmurs and no lower extremity edema ABDOMEN:abdomen soft, non-tender and normal bowel sounds, no guarding, rigidity Musculoskeletal:no cyanosis of digits and no clubbing  PSYCH: alert & oriented x 3 with fluent speech NEURO: no focal motor/sensory deficits  LABORATORY DATA:  I have reviewed the data as listed Lab Results  Component Value Date   WBC 1.7 (L) 11/27/2020   HGB 14.2 11/27/2020   HCT 42.7 11/27/2020   MCV 91.8 11/27/2020   PLT 193 11/27/2020     Chemistry      Component Value Date/Time   NA 138 11/27/2020 0757   NA 135 09/12/2019 1048   K 4.8 11/27/2020 0757   CL 100 11/27/2020 0757   CO2 31 11/27/2020 0757   BUN 9 11/27/2020 0757   BUN 18 09/12/2019 1048   CREATININE 1.37 (H) 11/27/2020 0757   CREATININE 1.43 (H) 11/13/2020 0816   CREATININE 1.38 (H) 11/30/2018 1102      Component Value  Date/Time   CALCIUM 8.9 11/27/2020 0757   ALKPHOS 43 10/15/2020 1132   AST 35 10/15/2020 1132   ALT 79 (H) 10/15/2020 1132   BILITOT 0.8 10/15/2020 1132     I reviewed labs from today, will arrange for hydration and magnesium replacement  RADIOGRAPHIC STUDIES: I have personally reviewed the radiological images as listed and agreed with the findings in the report. IR Gastrostomy Tube  Result Date: 11/21/2020 INDICATION: Tonsillar carcinoma, radiation pharyngitis EXAM: FLUOROSCOPIC 20 FRENCH PULL-THROUGH GASTROSTOMY Date:  11/21/2020 11/21/2020 1:00 pm Radiologist:  M. Daryll Brod, MD Guidance:  Fluoroscopic MEDICATIONS: Ancef 2 g; Antibiotics were administered within 1 hour of the procedure. Glucagon 0.5 mg IV ANESTHESIA/SEDATION: Versed 1.0 mg IV; Fentanyl 100 mcg IV Moderate Sedation Time:  10 The patient was continuously monitored during the procedure by the interventional radiology nurse under my direct supervision. CONTRAST:  10 cc-administered into the gastric lumen. FLUOROSCOPY TIME:  Fluoroscopy Time: 1 minutes 54 seconds (46 mGy). COMPLICATIONS: None PROCEDURE: Informed consent was obtained from the patient following explanation of the procedure, risks, benefits and alternatives. The patient understands, agrees and consents for the procedure. All questions were addressed. A time out was performed. Maximal barrier sterile technique utilized including caps, mask, sterile gowns, sterile gloves, large sterile drape, hand hygiene, and betadine prep. The left upper quadrant was sterilely prepped and draped. An oral gastric catheter was inserted into the stomach under fluoroscopy. The existing nasogastric feeding tube was removed. Air was injected into the stomach for insufflation and visualization under fluoroscopy. The air distended stomach was confirmed beneath the anterior abdominal wall in the frontal and lateral projections. Under sterile conditions and local anesthesia, a 85 gauge trocar needle  was utilized to access the stomach percutaneously beneath the left subcostal margin. Needle position was confirmed  within the stomach under biplane fluoroscopy. Contrast injection confirmed position also. A single T tack was deployed for gastropexy. Over an Amplatz guide wire, a 9-French sheath was inserted into the stomach. A snare device was utilized to capture the oral gastric catheter. The snare device was pulled retrograde from the stomach up the esophagus and out the oropharynx. The 20-French pull-through gastrostomy was connected to the snare device and pulled antegrade through the oropharynx down the esophagus into the stomach and then through the percutaneous tract external to the patient. The gastrostomy was assembled externally. Contrast injection confirms position in the stomach. Images were obtained for documentation. The patient tolerated procedure well. No immediate complication. IMPRESSION: Fluoroscopic insertion of a 20-French "pull-through" gastrostomy. Electronically Signed   By: Jerilynn Mages.  Shick M.D.   On: 11/21/2020 13:09   IR IMAGING GUIDED PORT INSERTION  Result Date: 11/21/2020 CLINICAL DATA:  Tonsillar carcinoma EXAM: RIGHT INTERNAL JUGULAR SINGLE LUMEN POWER PORT CATHETER INSERTION Date:  11/21/2020 11/21/2020 12:31 pm Radiologist:  Jerilynn Mages. Daryll Brod, MD Guidance:  Ultrasound fluoroscopic MEDICATIONS: Ancef 2 g within 1 hour of the procedure; The antibiotic was administered within an appropriate time interval prior to skin puncture. ANESTHESIA/SEDATION: Versed 2 mg IV; Fentanyl 100 mcg IV; Moderate Sedation Time:  25 minutes The patient was continuously monitored during the procedure by the interventional radiology nurse under my direct supervision. FLUOROSCOPY TIME:  0 minutes, 36 seconds (12 mGy) COMPLICATIONS: None immediate. CONTRAST:  None. PROCEDURE: Informed consent was obtained from the patient following explanation of the procedure, risks, benefits and alternatives. The patient  understands, agrees and consents for the procedure. All questions were addressed. A time out was performed. Maximal barrier sterile technique utilized including caps, mask, sterile gowns, sterile gloves, large sterile drape, hand hygiene, and 2% chlorhexidine scrub. Under sterile conditions and local anesthesia, right internal jugular micropuncture venous access was performed. Access was performed with ultrasound. Images were obtained for documentation of the patent right internal jugular vein. A guide wire was inserted followed by a transitional dilator. This allowed insertion of a guide wire and catheter into the IVC. Measurements were obtained from the SVC / RA junction back to the right IJ venotomy site. In the right infraclavicular chest, a subcutaneous pocket was created over the second anterior rib. This was done under sterile conditions and local anesthesia. 1% lidocaine with epinephrine was utilized for this. A 2.5 cm incision was made in the skin. Blunt dissection was performed to create a subcutaneous pocket over the right pectoralis major muscle. The pocket was flushed with saline vigorously. There was adequate hemostasis. The port catheter was assembled and checked for leakage. The port catheter was secured in the pocket with two retention sutures. The tubing was tunneled subcutaneously to the right venotomy site and inserted into the SVC/RA junction through a valved peel-away sheath. Position was confirmed with fluoroscopy. Images were obtained for documentation. The patient tolerated the procedure well. No immediate complications. Incisions were closed in a two layer fashion with 4 - 0 Vicryl suture. Dermabond was applied to the skin. The port catheter was accessed, blood was aspirated followed by saline and heparin flushes. Needle was removed. A dry sterile dressing was applied. IMPRESSION: Ultrasound and fluoroscopically guided right internal jugular single lumen power port catheter insertion. Tip  in the SVC/RA junction. Catheter ready for use. Electronically Signed   By: Jerilynn Mages.  Shick M.D.   On: 11/21/2020 12:59   Final Pathologic Diagnosis    TONSIL, BIOPSY:  Squamous cell carcinoma, HPV associated.  Electronically signed by Garnette Czech, MD on 09/25/2020 at 3:50 PM    All questions were answered. The patient knows to call the clinic with any problems, questions or concerns. I spent a total of 30 minutes in the care of this patient including history and physical, review of medical records, counseling and coordination of care.    Benay Pike, MD 11/27/2020 8:53 AM

## 2020-11-27 NOTE — Addendum Note (Signed)
Addended by: Adaline Sill on: 11/27/2020 01:47 PM   Modules accepted: Orders

## 2020-11-27 NOTE — Progress Notes (Signed)
Nutrition follow-up completed with patient during infusion for tonsil cancer. Patient reports increasing pain with swallowing.  Pain medications have been adjusted. Reports he continues to drink 3 cartons of Ensure Plus a day.  He is using 3 cartons of Osmolite 1.5 via PEG. He can eat soup and [place] and is trying to eat at least twice a day.  He is experiencing some constipation. Weight was documented as 214.4 pounds on February 3. Patient has some questions regarding flushing feeding tube.  Nutrition diagnosis: Inadequate oral intake continues.  Estimated nutrition needs: 2400-2600 cal, 120-140 g protein, 2.6 L fluid.  Intervention: Continue oral intake of food and Ensure Plus by mouth as tolerated. Work to increase Osmolite 1.5 x 1 carton every 2 days to goal rate of 7 cartons a day as oral intake decreases. Flush with 120 mL of water before and after bolus feedings. Begin 30 mL of Prosource, no carb 2 times a day once consuming a total of 7 cans of Osmolite 1.5/Ensure Plus. Reviewed strategies for flushing feeding tube.  Educated on ways to safely unclog a blocked feeding tube.  Patient encouraged to always contact provider for assistance as needed. Other questions answered and teach back method used. Encouraged additional water by mouth or by tube as tolerated.  Monitoring, evaluation, goals: Patient will tolerate adequate calories and protein to minimize weight loss.  Next visit: Monday, February 7 by telephone.  **Disclaimer: This note was dictated with voice recognition software. Similar sounding words can inadvertently be transcribed and this note may contain transcription errors which may not have been corrected upon publication of note.**

## 2020-11-28 ENCOUNTER — Ambulatory Visit
Admission: RE | Admit: 2020-11-28 | Discharge: 2020-11-28 | Disposition: A | Discharge status: Home / Self Care | Payer: BC Managed Care – PPO | Source: Ambulatory Visit | Attending: Radiation Oncology | Admitting: Radiation Oncology

## 2020-11-28 ENCOUNTER — Other Ambulatory Visit: Payer: Self-pay | Admitting: Hematology and Oncology

## 2020-11-28 ENCOUNTER — Other Ambulatory Visit: Payer: Self-pay

## 2020-11-28 DIAGNOSIS — C099 Malignant neoplasm of tonsil, unspecified: Secondary | ICD-10-CM | POA: Diagnosis not present

## 2020-11-28 DIAGNOSIS — C090 Malignant neoplasm of tonsillar fossa: Secondary | ICD-10-CM | POA: Diagnosis not present

## 2020-11-28 DIAGNOSIS — F17210 Nicotine dependence, cigarettes, uncomplicated: Secondary | ICD-10-CM | POA: Diagnosis not present

## 2020-11-28 MED ORDER — MORPHINE SULFATE 20 MG/5ML PO SOLN: ORAL | 0 refills | Status: DC

## 2020-11-28 MED FILL — MORPHINE SULF 20 MG/5 ML SO: 20 | 10 days supply | Qty: 80 | Fill #0

## 2020-11-28 NOTE — Progress Notes (Signed)
Message received that [facility] doesn't have the MS liquid form New prescription sent to [facility] outpatient pharmacy.

## 2020-12-01 ENCOUNTER — Ambulatory Visit
Admission: RE | Admit: 2020-12-01 | Discharge: 2020-12-01 | Disposition: A | Discharge status: Home / Self Care | Payer: BC Managed Care – PPO | Source: Ambulatory Visit | Attending: Radiation Oncology | Admitting: Radiation Oncology

## 2020-12-01 ENCOUNTER — Telehealth: Payer: Self-pay | Admitting: Nutrition

## 2020-12-01 ENCOUNTER — Other Ambulatory Visit: Payer: Self-pay

## 2020-12-01 ENCOUNTER — Other Ambulatory Visit: Payer: Self-pay | Admitting: Hematology and Oncology

## 2020-12-01 ENCOUNTER — Inpatient Hospital Stay: Payer: BC Managed Care – PPO

## 2020-12-01 ENCOUNTER — Inpatient Hospital Stay: Payer: BC Managed Care – PPO | Admitting: Nutrition

## 2020-12-01 ENCOUNTER — Telehealth: Payer: Self-pay

## 2020-12-01 DIAGNOSIS — F419 Anxiety disorder, unspecified: Secondary | ICD-10-CM | POA: Diagnosis not present

## 2020-12-01 DIAGNOSIS — K5900 Constipation, unspecified: Secondary | ICD-10-CM | POA: Diagnosis not present

## 2020-12-01 DIAGNOSIS — Z931 Gastrostomy status: Secondary | ICD-10-CM | POA: Diagnosis not present

## 2020-12-01 DIAGNOSIS — Z224 Carrier of infections with a predominantly sexual mode of transmission: Secondary | ICD-10-CM | POA: Diagnosis not present

## 2020-12-01 DIAGNOSIS — Z87891 Personal history of nicotine dependence: Secondary | ICD-10-CM | POA: Diagnosis not present

## 2020-12-01 DIAGNOSIS — Z79899 Other long term (current) drug therapy: Secondary | ICD-10-CM | POA: Diagnosis not present

## 2020-12-01 DIAGNOSIS — C090 Malignant neoplasm of tonsillar fossa: Secondary | ICD-10-CM | POA: Diagnosis not present

## 2020-12-01 DIAGNOSIS — E860 Dehydration: Secondary | ICD-10-CM | POA: Diagnosis not present

## 2020-12-01 DIAGNOSIS — Z79891 Long term (current) use of opiate analgesic: Secondary | ICD-10-CM | POA: Diagnosis not present

## 2020-12-01 DIAGNOSIS — Z5111 Encounter for antineoplastic chemotherapy: Secondary | ICD-10-CM | POA: Diagnosis not present

## 2020-12-01 DIAGNOSIS — Z8616 Personal history of COVID-19: Secondary | ICD-10-CM | POA: Diagnosis not present

## 2020-12-01 DIAGNOSIS — K1231 Oral mucositis (ulcerative) due to antineoplastic therapy: Secondary | ICD-10-CM | POA: Diagnosis not present

## 2020-12-01 DIAGNOSIS — F17210 Nicotine dependence, cigarettes, uncomplicated: Secondary | ICD-10-CM | POA: Diagnosis not present

## 2020-12-01 DIAGNOSIS — C099 Malignant neoplasm of tonsil, unspecified: Secondary | ICD-10-CM | POA: Diagnosis not present

## 2020-12-01 DIAGNOSIS — N179 Acute kidney failure, unspecified: Secondary | ICD-10-CM | POA: Diagnosis not present

## 2020-12-01 DIAGNOSIS — D701 Agranulocytosis secondary to cancer chemotherapy: Secondary | ICD-10-CM | POA: Diagnosis not present

## 2020-12-01 LAB — CBC WITH DIFFERENTIAL/PLATELET
Abs Immature Granulocytes: 0 10*3/uL (ref 0.00–0.07)
Basophils Absolute: 0 10*3/uL (ref 0.0–0.1)
Basophils Relative: 1 %
Eosinophils Absolute: 0 10*3/uL (ref 0.0–0.5)
Eosinophils Relative: 1 %
HCT: 41.6 % (ref 39.0–52.0)
Hemoglobin: 14 g/dL (ref 13.0–17.0)
Immature Granulocytes: 0 %
Lymphocytes Relative: 13 %
Lymphs Abs: 0.4 10*3/uL — ABNORMAL LOW (ref 0.7–4.0)
MCH: 30.8 pg (ref 26.0–34.0)
MCHC: 33.7 g/dL (ref 30.0–36.0)
MCV: 91.4 fL (ref 80.0–100.0)
Monocytes Absolute: 0.5 10*3/uL (ref 0.1–1.0)
Monocytes Relative: 19 %
Neutro Abs: 1.9 10*3/uL (ref 1.7–7.7)
Neutrophils Relative %: 66 %
Platelets: 225 10*3/uL (ref 150–400)
RBC: 4.55 MIL/uL (ref 4.22–5.81)
RDW: 13 % (ref 11.5–15.5)
WBC: 2.8 10*3/uL — ABNORMAL LOW (ref 4.0–10.5)
nRBC: 0 % (ref 0.0–0.2)

## 2020-12-01 LAB — BASIC METABOLIC PANEL
Anion gap: 9 (ref 5–15)
BUN: 9 mg/dL (ref 6–20)
CO2: 26 mmol/L (ref 22–32)
Calcium: 8.9 mg/dL (ref 8.9–10.3)
Chloride: 99 mmol/L (ref 98–111)
Creatinine, Ser: 1.25 mg/dL — ABNORMAL HIGH (ref 0.61–1.24)
GFR, Estimated: >60 mL/min (ref >60)
Glucose, Bld: 96 mg/dL (ref 70–99)
Potassium: 4.3 mmol/L (ref 3.5–5.1)
Sodium: 134 mmol/L — ABNORMAL LOW (ref 135–145)

## 2020-12-01 LAB — MAGNESIUM: Magnesium: 1.5 mg/dL — ABNORMAL LOW (ref 1.7–2.4)

## 2020-12-01 MED ORDER — SODIUM CHLORIDE 0.9% FLUSH
10.0000 mL | INTRAVENOUS | Status: DC | PRN
  Administered 2020-12-01: 10 mL via INTRAVENOUS
  Filled 2020-12-01: qty 10

## 2020-12-01 MED ORDER — HEPARIN SOD (PORK) LOCK FLUSH 100 UNIT/ML IV SOLN
500.0000 [IU] | Freq: Once | INTRAVENOUS | Status: AC
  Administered 2020-12-01: 500 [IU] via INTRAVENOUS
  Filled 2020-12-01: qty 5

## 2020-12-01 NOTE — Telephone Encounter (Signed)
Nutrition follow-up completed over the telephone. (Patient request) Patient currently at the gym working out. Reports he continues to drink Ensure plus by mouth. He is also tolerating 4 cartons of Osmolite 1.5 via feeding tube. He flushes his feeding tube with 120 mL of water before and after bolus feedings. Reports he is using about 4 cartons of Osmolite 1.5 via PEG every day. Reports he is not really eating much food right now.  He denies nausea vomiting constipation and diarrhea. Weight is stable at 214.4 pounds February 3. He has no questions or concerns.  Nutrition diagnosis: Inadequate oral intake continues.  Estimated nutrition needs: 2400-2600 cal, 120-140 g protein, 2.6 L fluid.  Tube feeding goal rate: 7 cartons Osmolite 1.5 a day with 30 mL of Prosource no carbohydrate twice daily provides 2605 cal, 134 g protein, and 2707 mL free water.  Intervention: Patient educated to continue strategies for liquids by mouth as tolerated. As liquid intake decreases, patient understands to increase tube feeding and free water using PEG. We will continue to monitor weight and tolerance of tube feeding.  Monitoring, evaluation, goals: Tolerate adequate calories and protein to minimize weight loss.  Next visit: Tuesday, February 15 by telephone.  **Disclaimer: This note was dictated with voice recognition software. Similar sounding words can inadvertently be transcribed and this note may contain transcription errors which may not have been corrected upon publication of note.**

## 2020-12-01 NOTE — Progress Notes (Signed)
Nutrition follow-up completed with patient over the telephone.  See telephone note for details.

## 2020-12-01 NOTE — Telephone Encounter (Signed)
tx set for gsbo pt/dr iruku per inbasket message    Cortni Tays

## 2020-12-02 ENCOUNTER — Other Ambulatory Visit: Payer: Self-pay

## 2020-12-02 ENCOUNTER — Ambulatory Visit
Admission: RE | Admit: 2020-12-02 | Discharge: 2020-12-02 | Disposition: A | Discharge status: Home / Self Care | Payer: BC Managed Care – PPO | Source: Ambulatory Visit | Attending: Radiation Oncology | Admitting: Radiation Oncology

## 2020-12-02 ENCOUNTER — Ambulatory Visit: Payer: BC Managed Care – PPO

## 2020-12-02 ENCOUNTER — Ambulatory Visit: Payer: BC Managed Care – PPO | Admitting: Hematology and Oncology

## 2020-12-02 ENCOUNTER — Other Ambulatory Visit: Payer: BC Managed Care – PPO

## 2020-12-02 DIAGNOSIS — F17210 Nicotine dependence, cigarettes, uncomplicated: Secondary | ICD-10-CM | POA: Diagnosis not present

## 2020-12-02 DIAGNOSIS — C090 Malignant neoplasm of tonsillar fossa: Secondary | ICD-10-CM | POA: Diagnosis not present

## 2020-12-02 DIAGNOSIS — C099 Malignant neoplasm of tonsil, unspecified: Secondary | ICD-10-CM | POA: Diagnosis not present

## 2020-12-03 ENCOUNTER — Other Ambulatory Visit: Payer: Self-pay | Admitting: Hematology and Oncology

## 2020-12-03 ENCOUNTER — Other Ambulatory Visit: Payer: Self-pay

## 2020-12-03 ENCOUNTER — Telehealth: Payer: Self-pay

## 2020-12-03 ENCOUNTER — Telehealth: Payer: Self-pay | Admitting: *Deleted

## 2020-12-03 ENCOUNTER — Ambulatory Visit
Admission: RE | Admit: 2020-12-03 | Discharge: 2020-12-03 | Disposition: A | Discharge status: Home / Self Care | Payer: BC Managed Care – PPO | Source: Ambulatory Visit | Attending: Radiation Oncology | Admitting: Radiation Oncology

## 2020-12-03 ENCOUNTER — Inpatient Hospital Stay: Payer: BC Managed Care – PPO

## 2020-12-03 VITALS — BP 127/76 | HR 73 | Temp 98.4°F | Resp 17

## 2020-12-03 DIAGNOSIS — Z87891 Personal history of nicotine dependence: Secondary | ICD-10-CM | POA: Diagnosis not present

## 2020-12-03 DIAGNOSIS — F17210 Nicotine dependence, cigarettes, uncomplicated: Secondary | ICD-10-CM | POA: Diagnosis not present

## 2020-12-03 DIAGNOSIS — Z79891 Long term (current) use of opiate analgesic: Secondary | ICD-10-CM | POA: Diagnosis not present

## 2020-12-03 DIAGNOSIS — E860 Dehydration: Secondary | ICD-10-CM | POA: Diagnosis not present

## 2020-12-03 DIAGNOSIS — D701 Agranulocytosis secondary to cancer chemotherapy: Secondary | ICD-10-CM | POA: Diagnosis not present

## 2020-12-03 DIAGNOSIS — Z8616 Personal history of COVID-19: Secondary | ICD-10-CM | POA: Diagnosis not present

## 2020-12-03 DIAGNOSIS — N179 Acute kidney failure, unspecified: Secondary | ICD-10-CM | POA: Diagnosis not present

## 2020-12-03 DIAGNOSIS — C090 Malignant neoplasm of tonsillar fossa: Secondary | ICD-10-CM

## 2020-12-03 DIAGNOSIS — C099 Malignant neoplasm of tonsil, unspecified: Secondary | ICD-10-CM | POA: Diagnosis not present

## 2020-12-03 DIAGNOSIS — Z79899 Other long term (current) drug therapy: Secondary | ICD-10-CM | POA: Diagnosis not present

## 2020-12-03 DIAGNOSIS — K5900 Constipation, unspecified: Secondary | ICD-10-CM | POA: Diagnosis not present

## 2020-12-03 DIAGNOSIS — Z5111 Encounter for antineoplastic chemotherapy: Secondary | ICD-10-CM | POA: Diagnosis not present

## 2020-12-03 DIAGNOSIS — K1231 Oral mucositis (ulcerative) due to antineoplastic therapy: Secondary | ICD-10-CM | POA: Diagnosis not present

## 2020-12-03 DIAGNOSIS — Z931 Gastrostomy status: Secondary | ICD-10-CM | POA: Diagnosis not present

## 2020-12-03 DIAGNOSIS — Z224 Carrier of infections with a predominantly sexual mode of transmission: Secondary | ICD-10-CM | POA: Diagnosis not present

## 2020-12-03 MED ORDER — POTASSIUM CHLORIDE IN NACL 20-0.9 MEQ/L-% IV SOLN
Freq: Once | INTRAVENOUS | Status: AC
  Filled 2020-12-03: qty 1000

## 2020-12-03 MED ORDER — MORPHINE SULFATE (PF) 2 MG/ML IV SOLN
2.0000 mg | INTRAVENOUS | Status: DC | PRN
Start: 2020-12-03 — End: 2020-12-03
  Administered 2020-12-03 (×2): 2 mg via INTRAVENOUS

## 2020-12-03 MED ORDER — MORPHINE SULFATE (PF) 4 MG/ML IV SOLN
INTRAVENOUS | Status: AC
  Filled 2020-12-03: qty 1

## 2020-12-03 MED ORDER — SODIUM CHLORIDE 0.9 % IV SOLN
100.0000 mg/m2 | Freq: Once | INTRAVENOUS | Status: AC
  Administered 2020-12-03: 230 mg via INTRAVENOUS
  Filled 2020-12-03: qty 200

## 2020-12-03 MED ORDER — SODIUM CHLORIDE 0.9 % IV SOLN
10.0000 mg | Freq: Once | INTRAVENOUS | Status: AC
  Administered 2020-12-03: 10 mg via INTRAVENOUS
  Filled 2020-12-03: qty 10

## 2020-12-03 MED ORDER — MORPHINE SULFATE (PF) 2 MG/ML IV SOLN: 2.0000 mg | INTRAVENOUS | Status: DC | PRN

## 2020-12-03 MED ORDER — SODIUM CHLORIDE 0.9 % IV SOLN
Freq: Once | INTRAVENOUS | Status: AC
  Filled 2020-12-03: qty 250

## 2020-12-03 MED ORDER — MAGNESIUM SULFATE 2 GM/50ML IV SOLN
2.0000 g | Freq: Once | INTRAVENOUS | Status: AC
  Administered 2020-12-03: 2 g via INTRAVENOUS
  Filled 2020-12-03: qty 50

## 2020-12-03 MED ORDER — PALONOSETRON HCL INJECTION 0.25 MG/5ML
0.2500 mg | Freq: Once | INTRAVENOUS | Status: AC
  Administered 2020-12-03: 0.25 mg via INTRAVENOUS

## 2020-12-03 MED ORDER — MORPHINE SULFATE (PF) 2 MG/ML IV SOLN
INTRAVENOUS | Status: AC
  Filled 2020-12-03: qty 1

## 2020-12-03 MED ORDER — HYDROCODONE-ACETAMINOPHEN 7.5-325 MG/15ML PO SOLN: 10.0000 mL | Freq: Four times a day (QID) | ORAL | 0 refills | Status: DC | PRN

## 2020-12-03 MED ORDER — HEPARIN SOD (PORK) LOCK FLUSH 100 UNIT/ML IV SOLN
500.0000 [IU] | Freq: Once | INTRAVENOUS | Status: AC | PRN
  Administered 2020-12-03: 500 [IU]
  Filled 2020-12-03: qty 5

## 2020-12-03 MED ORDER — SODIUM CHLORIDE 0.9% FLUSH
10.0000 mL | INTRAVENOUS | Status: DC | PRN
  Administered 2020-12-03: 10 mL
  Filled 2020-12-03: qty 10

## 2020-12-03 MED ORDER — PALONOSETRON HCL INJECTION 0.25 MG/5ML
INTRAVENOUS | Status: AC
  Filled 2020-12-03: qty 5

## 2020-12-03 MED ORDER — SODIUM CHLORIDE 0.9 % IV SOLN
150.0000 mg | Freq: Once | INTRAVENOUS | Status: AC
  Administered 2020-12-03: 150 mg via INTRAVENOUS
  Filled 2020-12-03: qty 150

## 2020-12-03 NOTE — Telephone Encounter (Signed)
FYI  Per Amy L., [facility] no longer covers solution without failure of the following tablets: Tramadol, Hydrocodone-Acetaminophen, Oxycodone IR capsules, Morphine IR tablets or Oxycodone- Acetaminophen.   Fax information or call [phone] opt 3, opt 1 for peer to peer.  Select one, not both of these options."  Faxed 11/27/2020 nutrition and office visit information Attn: "Provider Courtesy Review" to support denial of Hydrocodone - Acetaminophen 7.5mg  - 325 mg/15 ml solution.  Awaiting response.

## 2020-12-03 NOTE — Patient Instructions (Signed)
[facility] Discharge Instructions for Patients Receiving Chemotherapy  Today you received the following chemotherapy agents Cisplatin  To help prevent nausea and vomiting after your treatment, we encourage you to take your nausea medication as prescribed by MD.   If you develop nausea and vomiting that is not controlled by your nausea medication, call the clinic.   BELOW ARE SYMPTOMS THAT SHOULD BE REPORTED IMMEDIATELY:  *FEVER GREATER THAN 100.5 F  *CHILLS WITH OR WITHOUT FEVER  NAUSEA AND VOMITING THAT IS NOT CONTROLLED WITH YOUR NAUSEA MEDICATION  *UNUSUAL SHORTNESS OF BREATH  *UNUSUAL BRUISING OR BLEEDING  TENDERNESS IN MOUTH AND THROAT WITH OR WITHOUT PRESENCE OF ULCERS  *URINARY PROBLEMS  *BOWEL PROBLEMS  UNUSUAL RASH Items with * indicate a potential emergency and should be followed up as soon as possible.  Feel free to call the clinic should you have any questions or concerns. The clinic phone number is [phone].  Please show the [facility] at check-in to the Emergency Department and triage nurse.

## 2020-12-03 NOTE — Progress Notes (Signed)
IV morphine added for severe pain during treatment.

## 2020-12-03 NOTE — Progress Notes (Signed)
Patient called saying he has spilled his morphine while trying to use it and he was using about 2 to 2.5 ml every 4 hrs for severe pain. He ran out of it already. When asked about his hydrocodone and MS tablets, he said he disposed it like instructed in the toilet. I am unsure  We discussed about returning the pills to [place], but he apparently didn't pay attention to this. He said he wished he was more accurate on the morphine usage and this would have worked better if he had dropper. He didn't reach out to [place] for any help regarding the dropper. Unfortunately given this, I don't feel comfortable prescribing the morphine at this time. We have given his liquid hydrocodone, MS contin tablets, liquid MS in just a matter of one week. He will have to continue using hydrocodone for the rest of the treatment. Prescribed liquid use Hydrocodone approximately for 12 days.  He needs to use it exactly as prescribed and no early refills will be allowed. Once he completes treatment, we will reassess long term pain medication use and refer him to pain management if needed.  Justin Baldwin

## 2020-12-03 NOTE — Telephone Encounter (Signed)
We received a MyChart message from the patient this morning stating that four days into his liquid morphine prescription, he has run out due to spillage and the inability to measure the medication correctly. I forwarded this message to Dr. Chryl Heck because there have been a few issues with his pain medication in the past.   Dr. Chryl Heck contacted the patient directly and had him state verbatim exactly how he had been using the liquid Morphine. The patient had been using roughly 5 mg of morphine q4h. Dr. Chryl Heck and myself both asked the patient during his last visit to bring his morphine and hydrocodone tablets in so that we could dispose of them. The patient states that he was told by his pharmacy to flush his pain medication down the toilet, so that is exactly what he did. We will contact the pharmacy to speak to them about this.  Dr. Chryl Heck explained to the patient that she could give him one week worth of hydrocodone to get by but that he had to make sure to use the medication specifically as prescribed. The patient stated that he should have contacted [place] sooner regarding the liquid morphine to ask about a dropper but he never did. The patient verbalized understanding after speaking with Dr. Chryl Heck and knows to contact us sooner next time if he is having any issues.

## 2020-12-03 NOTE — Progress Notes (Signed)
Talked to our chemo educator [facility] as well. She says she doesn't know of anything that suggests dumping opioid meds in the toilet. I have discussed very clearly with the patient, this is not encouraged at all. He needs to bring his un used medications back to our pharmacy. He was sorry, he expressed understanding of instructions.

## 2020-12-04 ENCOUNTER — Encounter: Payer: Self-pay | Admitting: Hematology and Oncology

## 2020-12-04 ENCOUNTER — Ambulatory Visit: Payer: BC Managed Care – PPO

## 2020-12-04 ENCOUNTER — Telehealth: Payer: Self-pay | Admitting: Hematology and Oncology

## 2020-12-04 ENCOUNTER — Ambulatory Visit
Admission: RE | Admit: 2020-12-04 | Discharge: 2020-12-04 | Disposition: A | Discharge status: Home / Self Care | Payer: BC Managed Care – PPO | Source: Ambulatory Visit | Attending: Radiation Oncology | Admitting: Radiation Oncology

## 2020-12-04 ENCOUNTER — Inpatient Hospital Stay: Payer: BC Managed Care – PPO

## 2020-12-04 ENCOUNTER — Inpatient Hospital Stay (HOSPITAL_BASED_OUTPATIENT_CLINIC_OR_DEPARTMENT_OTHER): Payer: BC Managed Care – PPO | Admitting: Hematology and Oncology

## 2020-12-04 ENCOUNTER — Other Ambulatory Visit: Payer: Self-pay

## 2020-12-04 VITALS — BP 118/64 | HR 73 | Temp 98.0°F | Resp 20 | Ht 72.0 in | Wt 210.6 lb

## 2020-12-04 DIAGNOSIS — Z5111 Encounter for antineoplastic chemotherapy: Secondary | ICD-10-CM | POA: Diagnosis not present

## 2020-12-04 DIAGNOSIS — C090 Malignant neoplasm of tonsillar fossa: Secondary | ICD-10-CM

## 2020-12-04 DIAGNOSIS — N179 Acute kidney failure, unspecified: Secondary | ICD-10-CM | POA: Diagnosis not present

## 2020-12-04 DIAGNOSIS — Z224 Carrier of infections with a predominantly sexual mode of transmission: Secondary | ICD-10-CM | POA: Diagnosis not present

## 2020-12-04 DIAGNOSIS — Z79891 Long term (current) use of opiate analgesic: Secondary | ICD-10-CM | POA: Diagnosis not present

## 2020-12-04 DIAGNOSIS — D701 Agranulocytosis secondary to cancer chemotherapy: Secondary | ICD-10-CM | POA: Diagnosis not present

## 2020-12-04 DIAGNOSIS — F17210 Nicotine dependence, cigarettes, uncomplicated: Secondary | ICD-10-CM | POA: Diagnosis not present

## 2020-12-04 DIAGNOSIS — K1231 Oral mucositis (ulcerative) due to antineoplastic therapy: Secondary | ICD-10-CM | POA: Diagnosis not present

## 2020-12-04 DIAGNOSIS — Z79899 Other long term (current) drug therapy: Secondary | ICD-10-CM | POA: Diagnosis not present

## 2020-12-04 DIAGNOSIS — Z87891 Personal history of nicotine dependence: Secondary | ICD-10-CM | POA: Diagnosis not present

## 2020-12-04 DIAGNOSIS — K5900 Constipation, unspecified: Secondary | ICD-10-CM | POA: Diagnosis not present

## 2020-12-04 DIAGNOSIS — E860 Dehydration: Secondary | ICD-10-CM | POA: Diagnosis not present

## 2020-12-04 DIAGNOSIS — Z931 Gastrostomy status: Secondary | ICD-10-CM | POA: Diagnosis not present

## 2020-12-04 DIAGNOSIS — Z95828 Presence of other vascular implants and grafts: Secondary | ICD-10-CM | POA: Insufficient documentation

## 2020-12-04 DIAGNOSIS — C099 Malignant neoplasm of tonsil, unspecified: Secondary | ICD-10-CM | POA: Diagnosis not present

## 2020-12-04 DIAGNOSIS — Z8616 Personal history of COVID-19: Secondary | ICD-10-CM | POA: Diagnosis not present

## 2020-12-04 LAB — CBC WITH DIFFERENTIAL/PLATELET
Abs Immature Granulocytes: 0.01 10*3/uL (ref 0.00–0.07)
Basophils Absolute: 0 10*3/uL (ref 0.0–0.1)
Basophils Relative: 0 %
Eosinophils Absolute: 0 10*3/uL (ref 0.0–0.5)
Eosinophils Relative: 0 %
HCT: 40.1 % (ref 39.0–52.0)
Hemoglobin: 13.6 g/dL (ref 13.0–17.0)
Immature Granulocytes: 0 %
Lymphocytes Relative: 6 %
Lymphs Abs: 0.2 10*3/uL — ABNORMAL LOW (ref 0.7–4.0)
MCH: 30.7 pg (ref 26.0–34.0)
MCHC: 33.9 g/dL (ref 30.0–36.0)
MCV: 90.5 fL (ref 80.0–100.0)
Monocytes Absolute: 0.1 10*3/uL (ref 0.1–1.0)
Monocytes Relative: 3 %
Neutro Abs: 3.2 10*3/uL (ref 1.7–7.7)
Neutrophils Relative %: 91 %
Platelets: 311 10*3/uL (ref 150–400)
RBC: 4.43 MIL/uL (ref 4.22–5.81)
RDW: 12.8 % (ref 11.5–15.5)
WBC: 3.5 10*3/uL — ABNORMAL LOW (ref 4.0–10.5)
nRBC: 0 % (ref 0.0–0.2)

## 2020-12-04 LAB — BASIC METABOLIC PANEL
Anion gap: 7 (ref 5–15)
BUN: 15 mg/dL (ref 6–20)
CO2: 27 mmol/L (ref 22–32)
Calcium: 8.4 mg/dL — ABNORMAL LOW (ref 8.9–10.3)
Chloride: 100 mmol/L (ref 98–111)
Creatinine, Ser: 1.28 mg/dL — ABNORMAL HIGH (ref 0.61–1.24)
GFR, Estimated: >60 mL/min (ref >60)
Glucose, Bld: 170 mg/dL — ABNORMAL HIGH (ref 70–99)
Potassium: 5 mmol/L (ref 3.5–5.1)
Sodium: 134 mmol/L — ABNORMAL LOW (ref 135–145)

## 2020-12-04 MED ORDER — HEPARIN SOD (PORK) LOCK FLUSH 100 UNIT/ML IV SOLN
500.0000 [IU] | Freq: Once | INTRAVENOUS | Status: AC
  Administered 2020-12-04: 500 [IU]
  Filled 2020-12-04: qty 5

## 2020-12-04 MED ORDER — SODIUM CHLORIDE 0.9% FLUSH
10.0000 mL | Freq: Once | INTRAVENOUS | Status: AC
  Administered 2020-12-04: 10 mL
  Filled 2020-12-04: qty 10

## 2020-12-04 NOTE — Progress Notes (Signed)
Justin Baldwin NOTE  Patient Care Team: Pleas [name], NP as PCP - General (Internal Medicine) Festus Aloe, MD as Consulting Physician (Urology) Melida Quitter, MD as Consulting Physician (Otolaryngology) Karie Mainland, RD as Dietitian (Nutrition)  CHIEF COMPLAINTS/PURPOSE OF CONSULTATION:  SCC tonsil  ASSESSMENT & PLAN:  No problem-specific Assessment & Plan notes found for this encounter.  No orders of the defined types were placed in this encounter.  This is a 45 yr old male patient with newly diagnosed T3( primary tumor according to TB discussion is borderline 4 cm), N1( multiple lymph nodes ipsilateral but PET pending) and M0/Stage II  referred to medical oncology for treatment consideration We reviewed his imaging, pathology and discussed possible options for treatment in TB today. He followed up with ENT team at [facility] for consideration of surgery.   Given bilateral neck disease, recommendation was to proceed with concurrent chemoradiation. Given young age and excellent PS at baseline with no significant comorbidities, I recommended that he consider cisplatin every 21-day dosing at 100 mg/m for 3 doses concurrent with radiation.  We have discussed about side effects including and not limited to fatigue, nausea, increased risk of infection, ototoxicity with occasional permanent hearing loss, nephrotoxicity which can be permanent in patients etc. He started radiation on 10/28/2020, chemo delayed because of COVID Received first dose of 100 mg/m2 cisplatin on 11/07/2020 Second dose of cisplatin delayed due to neutropenia, administered on 12/03/2020 Excellent response to chemo radiation so far. We will omit the planned 3 rd dose of cisplatin since it cannot be administered concurrently with radiation. He understands that this was likely to happen since we had delays starting chemotherapy given COVID infection.  2. Treatment induced mucositis,  Severe  pain, using hydrocodone currently He used up his morphine in 4 days because he says its was difficult to measure the accurate dosing We will continue hydrocodone at home, and use PRN IV morphine while giving him fluids if pain is out of control. I explained to him that it is not recommended to dispose medication in the toilet, he should bring them back and we can arrange for safe disposal. I offered pain management referral if he needs stronger pain medications on a routine basis. He doesn't want to do this at this time.  3. Dysgeusia, weight loss.  He is using his PEG tube 5 of osmolite a day, unable to eat anything.  4. Constipation, active bowel sounds, passing gas, abdominal exam normal Encouraged to try dulcolax suppository PRN.  Labs reviewed, no concern for major electrolyte disturbance.  HISTORY OF PRESENTING ILLNESS:   Justin Baldwin 45 y.o. male is here because of new diagnosis of SCC tonsil/oropharynx.  HISTORY OF PRESENT ILLNESS:  Justin Baldwin is a 45 y.o. male who presented with ongoing head cold symptoms, sinus issues, left ear pain, and left-sided throat pain since September.  When the cold-like symptoms resolved, the throat and ear pain did not. Augmentin was prescribed without relief.  Subsequently, the patient saw Dr. Redmond Baseman, ENT at [facility], who noted that the left tonsil had a mass appearance on 09/19/2020. 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  Pertinent imaging thus far includes soft tissue neck CT scan on 09/30/2020 revealed a left tonsillar mass abutting the glossotonsillar sulcus and base of the tongue, consistent with known malignancy. Left level 2/3 cystic or necrotic adenopathy reflected nodal metastasis.  CT scan of chest performed on 09/30/2020  revealing a non-specific left lower lobe pulmonary micronodule but no acute intrathoracic abnormality and no definite intrathoracic metastasis.  I have personally reviewed  his imaging and discussed him with Dr. Redmond Baseman.    He had a PET scan which showed hypermetabolic left tonsillar mass and bilateral lymphadenopathy consistent with metastatic disease.  Hence recommendation was to proceed with concurrent chemoradiation.  He started radiation on 10/28/2020 chemotherapy delayed to 11/07/2020 because of COVID-19 infection.   He is here for a follow-up after cycle 2 of cisplatin chemotherapy.   Severe pain, 8/10, using hydrocodone. No nausea or vomiting. No fevers or chills.   No change in urination, hearing loss or new neuropathy. Continues to have issues with constipation Mouth sores have become worse, cant swallow anything. Rest of the pertinent 10 point ROS reviewed and negative  MEDICAL HISTORY:  Past Medical History:  Diagnosis Date  . Diabetes mellitus without complication ([place])   . Erectile dysfunction   . Insomnia   . Skin abnormality    hidradenitis suppurativa     SURGICAL HISTORY: Past Surgical History:  Procedure Laterality Date  . IR GASTROSTOMY TUBE MOD SED  11/21/2020  . IR IMAGING GUIDED PORT INSERTION  11/21/2020  . IRRIGATION AND DEBRIDEMENT ABSCESS N/A 09/22/2018   Procedure: IRRIGATION AND DEBRIDEMENT ABSCESS;  Surgeon: Ralene Ok, MD;  Location: [facility];  Service: General;  Laterality: N/A;    SOCIAL HISTORY: Social History   Socioeconomic History  . Marital status: Married    Spouse name: Not on file  . Number of children: Not on file  . Years of education: Not on file  . Highest education level: Not on file  Occupational History  . Not on file  Tobacco Use  . Smoking status: Former Smoker    Packs/day: 1.00    Types: Cigarettes    Quit date: 10/27/2020    Years since quitting: 0.1  . Smokeless tobacco: Former Systems developer    Quit date: 02/14/2016  Vaping Use  . Vaping Use: Never used  Substance and Sexual Activity  . Alcohol use: Not Currently    Alcohol/week: 5.0 standard drinks    Types: 5 Shots of liquor per week     Comment: occasinal   . Drug use: No  . Sexual activity: Yes  Other Topics Concern  . Not on file  Social History Narrative   ** Merged History Encounter **       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    Social Determinants of Health   Financial Resource Strain: Low Risk   . Difficulty of Paying Living Expenses: Not hard at all  Food Insecurity: No Food Insecurity  . Worried About Charity fundraiser in the Last Year: Never true  . Ran Out of Food in the Last Year: Never true  Transportation Needs: No Transportation Needs  . Lack of Transportation (Medical): No  . Lack of Transportation (Non-Medical): No  Physical Activity: Not on file  Stress: Not on file  Social Connections: Not on file  Intimate Partner Violence: Not on file    FAMILY HISTORY: Family History  Problem Relation Age of Onset  . Heart attack Father   . Emphysema Paternal Grandmother   . Arthritis Mother     ALLERGIES:  has No Known Allergies.  MEDICATIONS:  Current Outpatient Medications  Medication Sig Dispense Refill  . baclofen (LIORESAL) 10 MG tablet Take 1 tablet (10 mg total) by mouth every 8 (eight)  hours as needed (hiccups). 20 each 0  . blood glucose meter kit and supplies KIT Dispense based on patient and insurance preference. Use up to four times daily as directed. (FOR ICD-9 250.00, 250.01). 1 each 0  . dexamethasone (DECADRON) 4 MG tablet Take 2 tablets (8 mg total) by mouth daily. Take daily x 3 days starting the day after cisplatin chemotherapy. Take with food. 30 tablet 1  . fluconazole (DIFLUCAN) 100 MG tablet Take 2 tablets today, and 1 tablet daily for 6 more days. Hold Rosuvastatin while on this. 8 tablet 0  . fluticasone (FLONASE) 50 MCG/ACT nasal spray Place 2 sprays into both nostrils daily. 16 g 1  . glucose blood test strip Use three times daily as instructed 100 each 3  . HYDROcodone-acetaminophen (HYCET) 7.5-325 mg/15 ml solution Take 10  mLs by mouth every 6 (six) hours as needed for moderate pain or severe pain. 473 mL 0  . insulin glargine (LANTUS) 100 UNIT/ML injection Inject 0.1 mLs (10 Units total) into the skin daily. 10 mL 2  . lidocaine (XYLOCAINE) 2 % solution Patient: Mix 1part 2% viscous lidocaine, 1part H20. Swish & swallow 52m of diluted mixture, 340m before meals and at bedtime, up to QID 200 mL 4  . lidocaine-prilocaine (EMLA) cream Apply to affected area once 30 g 3  . LORazepam (ATIVAN) 0.5 MG tablet Take 1 tablet (0.5 mg total) by mouth every 8 (eight) hours as needed for anxiety (Nausea or vomiting, take 1-2 tabs as needed every 6 hrs for anxiety, nausea or vomiting). 45 tablet 0  . metFORMIN (GLUCOPHAGE XR) 500 MG 24 hr tablet Take 1 tablet (500 mg total) by mouth daily with breakfast. For diabetes. 90 tablet 3  . Nutritional Supplements (FEEDING SUPPLEMENT, OSMOLITE 1.5 CAL,) LIQD 2 cartons Osmolite 1.5 via PEG twice daily with 120 mL free water before and after.  1-1/2 cartons Osmolite 1.5 via PEG twice daily with 120 mL free water before and after.  Mix 30 mL Prosource no carb or equivalent with 30 mL water and flush with 210 mL water twice daily via PEG.  Provides 2605 cal, 134 g protein, 2707 mL free water/100% estimated needs.  0  . omeprazole (PRILOSEC) 20 MG capsule TAKE 1 CAPSULE BY MOUTH DAILY FOR HEARTBURN 90 capsule 3  . ondansetron (ZOFRAN) 8 MG tablet Take 1 tablet (8 mg total) by mouth 2 (two) times daily as needed. Start on the third day after cisplatin chemotherapy. 30 tablet 1  . oxymetazoline (AFRIN) 0.05 % nasal spray Place 1-2 sprays into both nostrils as needed for congestion.     . prochlorperazine (COMPAZINE) 10 MG tablet Take 1 tablet (10 mg total) by mouth every 6 (six) hours as needed (Nausea or vomiting). 30 tablet 1  . rosuvastatin (CRESTOR) 40 MG tablet Take 1 tablet (40 mg total) by mouth every evening. For cholesterol. 90 tablet 3  . scopolamine (TRANSDERM-SCOP) 1 MG/3DAYS Place 1  patch (1.5 mg total) onto the skin every 3 (three) days. 4 patch 5  . tadalafil (CIALIS) 5 MG tablet Take 1 tablet (5 mg total) by mouth daily as needed for erectile dysfunction. 90 tablet 0  . zolpidem (AMBIEN) 5 MG tablet Take 1 tablet (5 mg total) by mouth at bedtime as needed for sleep. 30 tablet 0   No current facility-administered medications for this visit.     PHYSICAL EXAMINATION: ECOG PERFORMANCE STATUS: 0 - Asymptomatic  Vitals:   12/04/20 0831  BP: 118/64  Pulse:  73  Resp: 20  Temp: 98 F (36.7 C)  SpO2: 97%   Filed Weights   12/04/20 0831  Weight: 210 lb 9.6 oz (95.5 kg)    GENERAL:alert, no distress and comfortable SKIN: skin color, texture, turgor are normal, no rashes or significant lesions EYES: normal, conjunctiva are pink and non-injected, sclera clear Mucous membranes, severe mucositis. NECK: supple, thyroid normal size, tender, skin changes from ongoing radiation. LYMPH: NO palpable LN LUNGS: clear to auscultation and percussion with normal breathing effort HEART: regular rate & rhythm and no murmurs and no lower extremity edema ABDOMEN:abdomen soft, non-tender and normal bowel sounds, no guarding, rigidity Musculoskeletal:no cyanosis of digits and no clubbing  PSYCH: alert & oriented x 3 with fluent speech NEURO: no focal motor/sensory deficits  LABORATORY DATA:  I have reviewed the data as listed Lab Results  Component Value Date   WBC 3.5 (L) 12/04/2020   HGB 13.6 12/04/2020   HCT 40.1 12/04/2020   MCV 90.5 12/04/2020   PLT 311 12/04/2020     Chemistry      Component Value Date/Time   NA 134 (L) 12/01/2020 0830   NA 135 09/12/2019 1048   K 4.3 12/01/2020 0830   CL 99 12/01/2020 0830   CO2 26 12/01/2020 0830   BUN 9 12/01/2020 0830   BUN 18 09/12/2019 1048   CREATININE 1.25 (H) 12/01/2020 0830   CREATININE 1.43 (H) 11/13/2020 0816   CREATININE 1.38 (H) 11/30/2018 1102      Component Value Date/Time   CALCIUM 8.9 12/01/2020 0830    ALKPHOS 43 10/15/2020 1132   AST 35 10/15/2020 1132   ALT 79 (H) 10/15/2020 1132   BILITOT 0.8 10/15/2020 1132     I reviewed labs from today, will arrange for hydration and magnesium replacement  RADIOGRAPHIC STUDIES: I have personally reviewed the radiological images as listed and agreed with the findings in the report. IR Gastrostomy Tube  Result Date: 11/21/2020 INDICATION: Tonsillar carcinoma, radiation pharyngitis EXAM: FLUOROSCOPIC 20 FRENCH PULL-THROUGH GASTROSTOMY Date:  11/21/2020 11/21/2020 1:00 pm Radiologist:  M. Daryll Brod, MD Guidance:  Fluoroscopic MEDICATIONS: Ancef 2 g; Antibiotics were administered within 1 hour of the procedure. Glucagon 0.5 mg IV ANESTHESIA/SEDATION: Versed 1.0 mg IV; Fentanyl 100 mcg IV Moderate Sedation Time:  10 The patient was continuously monitored during the procedure by the interventional radiology nurse under my direct supervision. CONTRAST:  10 cc-administered into the gastric lumen. FLUOROSCOPY TIME:  Fluoroscopy Time: 1 minutes 54 seconds (46 mGy). COMPLICATIONS: None PROCEDURE: Informed consent was obtained from the patient following explanation of the procedure, risks, benefits and alternatives. The patient understands, agrees and consents for the procedure. All questions were addressed. A time out was performed. Maximal barrier sterile technique utilized including caps, mask, sterile gowns, sterile gloves, large sterile drape, hand hygiene, and betadine prep. The left upper quadrant was sterilely prepped and draped. An oral gastric catheter was inserted into the stomach under fluoroscopy. The existing nasogastric feeding tube was removed. Air was injected into the stomach for insufflation and visualization under fluoroscopy. The air distended stomach was confirmed beneath the anterior abdominal wall in the frontal and lateral projections. Under sterile conditions and local anesthesia, a 47 gauge trocar needle was utilized to access the stomach  percutaneously beneath the left subcostal margin. Needle position was confirmed within the stomach under biplane fluoroscopy. Contrast injection confirmed position also. A single T tack was deployed for gastropexy. Over an Amplatz guide wire, a 9-French sheath was inserted into the  stomach. A snare device was utilized to capture the oral gastric catheter. The snare device was pulled retrograde from the stomach up the esophagus and out the oropharynx. The 20-French pull-through gastrostomy was connected to the snare device and pulled antegrade through the oropharynx down the esophagus into the stomach and then through the percutaneous tract external to the patient. The gastrostomy was assembled externally. Contrast injection confirms position in the stomach. Images were obtained for documentation. The patient tolerated procedure well. No immediate complication. IMPRESSION: Fluoroscopic insertion of a 20-French "pull-through" gastrostomy. Electronically Signed   By: Jerilynn Mages.  Shick M.D.   On: 11/21/2020 13:09   IR IMAGING GUIDED PORT INSERTION  Result Date: 11/21/2020 CLINICAL DATA:  Tonsillar carcinoma EXAM: RIGHT INTERNAL JUGULAR SINGLE LUMEN POWER PORT CATHETER INSERTION Date:  11/21/2020 11/21/2020 12:31 pm Radiologist:  Jerilynn Mages. Daryll Brod, MD Guidance:  Ultrasound fluoroscopic MEDICATIONS: Ancef 2 g within 1 hour of the procedure; The antibiotic was administered within an appropriate time interval prior to skin puncture. ANESTHESIA/SEDATION: Versed 2 mg IV; Fentanyl 100 mcg IV; Moderate Sedation Time:  25 minutes The patient was continuously monitored during the procedure by the interventional radiology nurse under my direct supervision. FLUOROSCOPY TIME:  0 minutes, 36 seconds (12 mGy) COMPLICATIONS: None immediate. CONTRAST:  None. PROCEDURE: Informed consent was obtained from the patient following explanation of the procedure, risks, benefits and alternatives. The patient understands, agrees and consents for the  procedure. All questions were addressed. A time out was performed. Maximal barrier sterile technique utilized including caps, mask, sterile gowns, sterile gloves, large sterile drape, hand hygiene, and 2% chlorhexidine scrub. Under sterile conditions and local anesthesia, right internal jugular micropuncture venous access was performed. Access was performed with ultrasound. Images were obtained for documentation of the patent right internal jugular vein. A guide wire was inserted followed by a transitional dilator. This allowed insertion of a guide wire and catheter into the IVC. Measurements were obtained from the SVC / RA junction back to the right IJ venotomy site. In the right infraclavicular chest, a subcutaneous pocket was created over the second anterior rib. This was done under sterile conditions and local anesthesia. 1% lidocaine with epinephrine was utilized for this. A 2.5 cm incision was made in the skin. Blunt dissection was performed to create a subcutaneous pocket over the right pectoralis major muscle. The pocket was flushed with saline vigorously. There was adequate hemostasis. The port catheter was assembled and checked for leakage. The port catheter was secured in the pocket with two retention sutures. The tubing was tunneled subcutaneously to the right venotomy site and inserted into the SVC/RA junction through a valved peel-away sheath. Position was confirmed with fluoroscopy. Images were obtained for documentation. The patient tolerated the procedure well. No immediate complications. Incisions were closed in a two layer fashion with 4 - 0 Vicryl suture. Dermabond was applied to the skin. The port catheter was accessed, blood was aspirated followed by saline and heparin flushes. Needle was removed. A dry sterile dressing was applied. IMPRESSION: Ultrasound and fluoroscopically guided right internal jugular single lumen power port catheter insertion. Tip in the SVC/RA junction. Catheter ready for  use. Electronically Signed   By: Jerilynn Mages.  Shick M.D.   On: 11/21/2020 12:59   Final Pathologic Diagnosis    TONSIL, BIOPSY:              Squamous cell carcinoma, HPV associated.  Electronically signed by Garnette Czech, MD on 09/25/2020 at 3:50 PM    All questions  were answered. The patient knows to call the clinic with any problems, questions or concerns. I spent a total of 30 minutes in the care of this patient including history and physical, review of medical records, counseling and coordination of care.    Benay Pike, MD 12/04/2020 8:47 AM

## 2020-12-04 NOTE — Telephone Encounter (Signed)
Added infusion per 2/9 schedule message. Patient is aware of changes.

## 2020-12-05 ENCOUNTER — Ambulatory Visit
Admission: RE | Admit: 2020-12-05 | Discharge: 2020-12-05 | Disposition: A | Discharge status: Home / Self Care | Payer: BC Managed Care – PPO | Source: Ambulatory Visit | Attending: Radiation Oncology | Admitting: Radiation Oncology

## 2020-12-05 DIAGNOSIS — C099 Malignant neoplasm of tonsil, unspecified: Secondary | ICD-10-CM | POA: Diagnosis not present

## 2020-12-05 DIAGNOSIS — C090 Malignant neoplasm of tonsillar fossa: Secondary | ICD-10-CM | POA: Diagnosis not present

## 2020-12-05 DIAGNOSIS — F17210 Nicotine dependence, cigarettes, uncomplicated: Secondary | ICD-10-CM | POA: Diagnosis not present

## 2020-12-08 ENCOUNTER — Inpatient Hospital Stay: Payer: BC Managed Care – PPO

## 2020-12-08 ENCOUNTER — Ambulatory Visit
Admission: RE | Admit: 2020-12-08 | Discharge: 2020-12-08 | Disposition: A | Discharge status: Home / Self Care | Payer: BC Managed Care – PPO | Source: Ambulatory Visit | Attending: Radiation Oncology | Admitting: Radiation Oncology

## 2020-12-08 ENCOUNTER — Other Ambulatory Visit: Payer: Self-pay | Admitting: Hematology and Oncology

## 2020-12-08 ENCOUNTER — Other Ambulatory Visit: Payer: Self-pay

## 2020-12-08 VITALS — BP 113/74 | HR 66 | Temp 97.7°F | Resp 18 | Wt 201.1 lb

## 2020-12-08 DIAGNOSIS — Z87891 Personal history of nicotine dependence: Secondary | ICD-10-CM | POA: Diagnosis not present

## 2020-12-08 DIAGNOSIS — Z79891 Long term (current) use of opiate analgesic: Secondary | ICD-10-CM | POA: Diagnosis not present

## 2020-12-08 DIAGNOSIS — Z95828 Presence of other vascular implants and grafts: Secondary | ICD-10-CM

## 2020-12-08 DIAGNOSIS — Z79899 Other long term (current) drug therapy: Secondary | ICD-10-CM | POA: Diagnosis not present

## 2020-12-08 DIAGNOSIS — C090 Malignant neoplasm of tonsillar fossa: Secondary | ICD-10-CM | POA: Diagnosis not present

## 2020-12-08 DIAGNOSIS — D701 Agranulocytosis secondary to cancer chemotherapy: Secondary | ICD-10-CM | POA: Diagnosis not present

## 2020-12-08 DIAGNOSIS — N179 Acute kidney failure, unspecified: Secondary | ICD-10-CM | POA: Diagnosis not present

## 2020-12-08 DIAGNOSIS — C099 Malignant neoplasm of tonsil, unspecified: Secondary | ICD-10-CM | POA: Diagnosis not present

## 2020-12-08 DIAGNOSIS — Z931 Gastrostomy status: Secondary | ICD-10-CM | POA: Diagnosis not present

## 2020-12-08 DIAGNOSIS — Z8616 Personal history of COVID-19: Secondary | ICD-10-CM | POA: Diagnosis not present

## 2020-12-08 DIAGNOSIS — K5900 Constipation, unspecified: Secondary | ICD-10-CM | POA: Diagnosis not present

## 2020-12-08 DIAGNOSIS — Z5111 Encounter for antineoplastic chemotherapy: Secondary | ICD-10-CM | POA: Diagnosis not present

## 2020-12-08 DIAGNOSIS — E860 Dehydration: Secondary | ICD-10-CM | POA: Diagnosis not present

## 2020-12-08 DIAGNOSIS — Z224 Carrier of infections with a predominantly sexual mode of transmission: Secondary | ICD-10-CM | POA: Diagnosis not present

## 2020-12-08 DIAGNOSIS — F17210 Nicotine dependence, cigarettes, uncomplicated: Secondary | ICD-10-CM | POA: Diagnosis not present

## 2020-12-08 DIAGNOSIS — K1231 Oral mucositis (ulcerative) due to antineoplastic therapy: Secondary | ICD-10-CM | POA: Diagnosis not present

## 2020-12-08 MED ORDER — HEPARIN SOD (PORK) LOCK FLUSH 100 UNIT/ML IV SOLN
500.0000 [IU] | Freq: Once | INTRAVENOUS | Status: AC
Start: 2020-12-08 — End: 2020-12-08
  Administered 2020-12-08: 500 [IU]
  Filled 2020-12-08: qty 5

## 2020-12-08 MED ORDER — SODIUM CHLORIDE 0.9 % IV SOLN
INTRAVENOUS | Status: AC
  Filled 2020-12-08: qty 250

## 2020-12-08 MED ORDER — POTASSIUM CHLORIDE IN NACL 20-0.9 MEQ/L-% IV SOLN
INTRAVENOUS | Status: AC
  Filled 2020-12-08: qty 1000

## 2020-12-08 MED ORDER — SONAFINE EX EMUL
1.0000 "application " | Freq: Two times a day (BID) | CUTANEOUS | Status: DC
  Administered 2020-12-08: 1 via TOPICAL

## 2020-12-08 MED ORDER — POTASSIUM CHLORIDE 10 MEQ/100ML IV SOLN: 10.0000 meq | INTRAVENOUS | Status: AC

## 2020-12-08 MED ORDER — POTASSIUM CHLORIDE IN NACL 20-0.9 MEQ/L-% IV SOLN
INTRAVENOUS | Status: AC
  Filled 2020-12-08 (×2): qty 1000

## 2020-12-08 MED ORDER — SODIUM CHLORIDE 0.9% FLUSH
10.0000 mL | Freq: Once | INTRAVENOUS | Status: AC
  Administered 2020-12-08: 10 mL
  Filled 2020-12-08: qty 10

## 2020-12-08 NOTE — Patient Instructions (Signed)

## 2020-12-09 ENCOUNTER — Other Ambulatory Visit: Payer: BC Managed Care – PPO

## 2020-12-09 ENCOUNTER — Telehealth: Payer: Self-pay | Admitting: Nutrition

## 2020-12-09 ENCOUNTER — Ambulatory Visit
Admission: RE | Admit: 2020-12-09 | Discharge: 2020-12-09 | Disposition: A | Discharge status: Home / Self Care | Payer: BC Managed Care – PPO | Source: Ambulatory Visit | Attending: Radiation Oncology | Admitting: Radiation Oncology

## 2020-12-09 ENCOUNTER — Other Ambulatory Visit: Payer: Self-pay | Admitting: Hematology and Oncology

## 2020-12-09 ENCOUNTER — Inpatient Hospital Stay: Payer: BC Managed Care – PPO | Admitting: Nutrition

## 2020-12-09 ENCOUNTER — Encounter: Payer: BC Managed Care – PPO | Admitting: Nutrition

## 2020-12-09 ENCOUNTER — Ambulatory Visit: Payer: BC Managed Care – PPO

## 2020-12-09 ENCOUNTER — Other Ambulatory Visit: Payer: Self-pay

## 2020-12-09 ENCOUNTER — Ambulatory Visit: Payer: BC Managed Care – PPO | Admitting: Hematology and Oncology

## 2020-12-09 DIAGNOSIS — C090 Malignant neoplasm of tonsillar fossa: Secondary | ICD-10-CM

## 2020-12-09 DIAGNOSIS — C099 Malignant neoplasm of tonsil, unspecified: Secondary | ICD-10-CM | POA: Diagnosis not present

## 2020-12-09 DIAGNOSIS — F17210 Nicotine dependence, cigarettes, uncomplicated: Secondary | ICD-10-CM | POA: Diagnosis not present

## 2020-12-09 MED ORDER — LORAZEPAM 0.5 MG PO TABS: 0.5000 mg | ORAL_TABLET | Freq: Two times a day (BID) | ORAL | 0 refills | Status: DC | PRN

## 2020-12-09 NOTE — Telephone Encounter (Signed)
Nutrition follow-up completed with patient over the telephone. Patient reports it is definitely more difficult to swallow food and liquids. He is only tolerating water by mouth. Reports his mouth is very sore. Also having nausea but taking nausea medication as prescribed. Tolerating 4 cartons of Osmolite 1.5 via PEG with 120 mL free water before and after bolus feedings. Weight decreased and documented as 201.2 pounds down from 214.4 pounds February 3. Complains of constipation.  Nutrition diagnosis: Inadequate oral intake continues.  Estimated nutrition needs: 2400-2600 cal, 120-140 g protein, 2.6 L fluid.  Tube feeding goal rate: 7 cartons Osmolite 1.5 daily with 30 mL of Prosource no carbohydrate twice daily to provide 2605 cal, 134 g protein, and 2707 mL free water.    Intervention: Educated patient to increase Osmolite 1.5-1-1/2 cartons 4 times daily with 60 mL free water before and after bolus feeding. Increase free water by mouth and add water between bolus feedings as tolerated. Educated on strategies for improving constipation. Encouraged continued compliance with nausea medicine.  Monitoring, evaluation, goals: Tolerate adequate calories and protein to minimize further weight loss.  Next visit: Wednesday, February 23 during infusion.  **Disclaimer: This note was dictated with voice recognition software. Similar sounding words can inadvertently be transcribed and this note may contain transcription errors which may not have been corrected upon publication of note.**

## 2020-12-09 NOTE — Progress Notes (Signed)
See telephone note.

## 2020-12-10 ENCOUNTER — Ambulatory Visit
Admission: RE | Admit: 2020-12-10 | Discharge: 2020-12-10 | Disposition: A | Discharge status: Home / Self Care | Payer: BC Managed Care – PPO | Source: Ambulatory Visit | Attending: Radiation Oncology | Admitting: Radiation Oncology

## 2020-12-10 ENCOUNTER — Other Ambulatory Visit: Payer: Self-pay

## 2020-12-10 DIAGNOSIS — C099 Malignant neoplasm of tonsil, unspecified: Secondary | ICD-10-CM | POA: Diagnosis not present

## 2020-12-10 DIAGNOSIS — F17210 Nicotine dependence, cigarettes, uncomplicated: Secondary | ICD-10-CM | POA: Diagnosis not present

## 2020-12-10 DIAGNOSIS — C090 Malignant neoplasm of tonsillar fossa: Secondary | ICD-10-CM | POA: Diagnosis not present

## 2020-12-11 ENCOUNTER — Other Ambulatory Visit: Payer: Self-pay

## 2020-12-11 ENCOUNTER — Inpatient Hospital Stay: Payer: BC Managed Care – PPO

## 2020-12-11 ENCOUNTER — Ambulatory Visit: Payer: BC Managed Care – PPO | Attending: Radiation Oncology

## 2020-12-11 ENCOUNTER — Ambulatory Visit
Admission: RE | Admit: 2020-12-11 | Discharge: 2020-12-11 | Disposition: A | Discharge status: Home / Self Care | Payer: BC Managed Care – PPO | Source: Ambulatory Visit | Attending: Radiation Oncology | Admitting: Radiation Oncology

## 2020-12-11 ENCOUNTER — Inpatient Hospital Stay (HOSPITAL_BASED_OUTPATIENT_CLINIC_OR_DEPARTMENT_OTHER): Payer: BC Managed Care – PPO | Admitting: Hematology and Oncology

## 2020-12-11 ENCOUNTER — Encounter: Payer: BC Managed Care – PPO | Admitting: Nutrition

## 2020-12-11 ENCOUNTER — Encounter: Payer: Self-pay | Admitting: Hematology and Oncology

## 2020-12-11 ENCOUNTER — Ambulatory Visit: Payer: BC Managed Care – PPO

## 2020-12-11 VITALS — BP 103/68 | HR 63 | Temp 97.8°F | Resp 18 | Ht 72.0 in | Wt 199.2 lb

## 2020-12-11 DIAGNOSIS — K5900 Constipation, unspecified: Secondary | ICD-10-CM | POA: Diagnosis not present

## 2020-12-11 DIAGNOSIS — C090 Malignant neoplasm of tonsillar fossa: Secondary | ICD-10-CM

## 2020-12-11 DIAGNOSIS — C099 Malignant neoplasm of tonsil, unspecified: Secondary | ICD-10-CM | POA: Diagnosis not present

## 2020-12-11 DIAGNOSIS — Z8616 Personal history of COVID-19: Secondary | ICD-10-CM | POA: Diagnosis not present

## 2020-12-11 DIAGNOSIS — G893 Neoplasm related pain (acute) (chronic): Secondary | ICD-10-CM | POA: Diagnosis not present

## 2020-12-11 DIAGNOSIS — Z79891 Long term (current) use of opiate analgesic: Secondary | ICD-10-CM | POA: Diagnosis not present

## 2020-12-11 DIAGNOSIS — Z79899 Other long term (current) drug therapy: Secondary | ICD-10-CM | POA: Diagnosis not present

## 2020-12-11 DIAGNOSIS — Z87891 Personal history of nicotine dependence: Secondary | ICD-10-CM | POA: Diagnosis not present

## 2020-12-11 DIAGNOSIS — Z931 Gastrostomy status: Secondary | ICD-10-CM | POA: Diagnosis not present

## 2020-12-11 DIAGNOSIS — K1231 Oral mucositis (ulcerative) due to antineoplastic therapy: Secondary | ICD-10-CM | POA: Diagnosis not present

## 2020-12-11 DIAGNOSIS — R634 Abnormal weight loss: Secondary | ICD-10-CM

## 2020-12-11 DIAGNOSIS — E860 Dehydration: Secondary | ICD-10-CM | POA: Diagnosis not present

## 2020-12-11 DIAGNOSIS — N179 Acute kidney failure, unspecified: Secondary | ICD-10-CM | POA: Diagnosis not present

## 2020-12-11 DIAGNOSIS — R601 Generalized edema: Secondary | ICD-10-CM | POA: Insufficient documentation

## 2020-12-11 DIAGNOSIS — K5903 Drug induced constipation: Secondary | ICD-10-CM

## 2020-12-11 DIAGNOSIS — Z5111 Encounter for antineoplastic chemotherapy: Secondary | ICD-10-CM | POA: Diagnosis not present

## 2020-12-11 DIAGNOSIS — Z794 Long term (current) use of insulin: Secondary | ICD-10-CM

## 2020-12-11 DIAGNOSIS — Z95828 Presence of other vascular implants and grafts: Secondary | ICD-10-CM

## 2020-12-11 DIAGNOSIS — Z224 Carrier of infections with a predominantly sexual mode of transmission: Secondary | ICD-10-CM | POA: Diagnosis not present

## 2020-12-11 DIAGNOSIS — R293 Abnormal posture: Secondary | ICD-10-CM | POA: Insufficient documentation

## 2020-12-11 DIAGNOSIS — E119 Type 2 diabetes mellitus without complications: Secondary | ICD-10-CM

## 2020-12-11 DIAGNOSIS — D701 Agranulocytosis secondary to cancer chemotherapy: Secondary | ICD-10-CM | POA: Diagnosis not present

## 2020-12-11 DIAGNOSIS — F17210 Nicotine dependence, cigarettes, uncomplicated: Secondary | ICD-10-CM | POA: Diagnosis not present

## 2020-12-11 LAB — BASIC METABOLIC PANEL - CANCER CENTER ONLY
Anion gap: 9 (ref 5–15)
BUN: 25 mg/dL — ABNORMAL HIGH (ref 6–20)
CO2: 29 mmol/L (ref 22–32)
Calcium: 7.6 mg/dL — ABNORMAL LOW (ref 8.9–10.3)
Chloride: 98 mmol/L (ref 98–111)
Creatinine: 1.28 mg/dL — ABNORMAL HIGH (ref 0.61–1.24)
GFR, Estimated: >60 mL/min (ref >60)
Glucose, Bld: 85 mg/dL (ref 70–99)
Potassium: 3.3 mmol/L — ABNORMAL LOW (ref 3.5–5.1)
Sodium: 136 mmol/L (ref 135–145)

## 2020-12-11 LAB — MAGNESIUM: Magnesium: 1.1 mg/dL — ABNORMAL LOW (ref 1.7–2.4)

## 2020-12-11 LAB — CBC WITH DIFFERENTIAL (CANCER CENTER ONLY)
Abs Immature Granulocytes: 0.03 10*3/uL (ref 0.00–0.07)
Basophils Absolute: 0 10*3/uL (ref 0.0–0.1)
Basophils Relative: 0 %
Eosinophils Absolute: 0 10*3/uL (ref 0.0–0.5)
Eosinophils Relative: 0 %
HCT: 38.6 % — ABNORMAL LOW (ref 39.0–52.0)
Hemoglobin: 13.5 g/dL (ref 13.0–17.0)
Immature Granulocytes: 1 %
Lymphocytes Relative: 5 %
Lymphs Abs: 0.3 10*3/uL — ABNORMAL LOW (ref 0.7–4.0)
MCH: 30.6 pg (ref 26.0–34.0)
MCHC: 35 g/dL (ref 30.0–36.0)
MCV: 87.5 fL (ref 80.0–100.0)
Monocytes Absolute: 0.6 10*3/uL (ref 0.1–1.0)
Monocytes Relative: 10 %
Neutro Abs: 5.4 10*3/uL (ref 1.7–7.7)
Neutrophils Relative %: 84 %
Platelet Count: 190 10*3/uL (ref 150–400)
RBC: 4.41 MIL/uL (ref 4.22–5.81)
RDW: 12.8 % (ref 11.5–15.5)
WBC Count: 6.3 10*3/uL (ref 4.0–10.5)
nRBC: 0 % (ref 0.0–0.2)

## 2020-12-11 MED ORDER — SODIUM CHLORIDE 0.9% FLUSH
10.0000 mL | Freq: Once | INTRAVENOUS | Status: AC
  Administered 2020-12-11: 10 mL
  Filled 2020-12-11: qty 10

## 2020-12-11 MED ORDER — MORPHINE SULFATE ER 15 MG PO TBCR
15.0000 mg | EXTENDED_RELEASE_TABLET | Freq: Two times a day (BID) | ORAL | 0 refills | Status: DC
Start: 2020-12-11 — End: 2020-12-17

## 2020-12-11 MED ORDER — SODIUM CHLORIDE 0.9 % IV SOLN
Freq: Once | INTRAVENOUS | Status: AC
  Filled 2020-12-11: qty 250

## 2020-12-11 MED ORDER — SODIUM CHLORIDE 0.9 % IV SOLN
Freq: Once | INTRAVENOUS | Status: DC
  Filled 2020-12-11: qty 250

## 2020-12-11 NOTE — Progress Notes (Signed)
1 liter of NS over 1 hour ordered per Dr. Chryl Heck

## 2020-12-11 NOTE — Progress Notes (Signed)
Pt received IVFluids today per MD orders, no other orders for medications seen at this time. Pt discharged in stable condition

## 2020-12-11 NOTE — Patient Instructions (Signed)
Implanted Port Insertion, Care After This sheet gives you information about how to care for yourself after your procedure. Your health care provider may also give you more specific instructions. If you have problems or questions, contact your health care provider. What can I expect after the procedure? After the procedure, it is common to have:  Discomfort at the port insertion site.  Bruising on the skin over the port. This should improve over 3-4 days. Follow these instructions at home: Port care  After your port is placed, you will get a manufacturer's information card. The card has information about your port. Keep this card with you at all times.  Take care of the port as told by your health care provider. Ask your health care provider if you or a family member can get training for taking care of the port at home. A home health care nurse may also take care of the port.  Make sure to remember what type of port you have. Incision care  Follow instructions from your health care provider about how to take care of your port insertion site. Make sure you: ? Wash your hands with soap and water before and after you change your bandage (dressing). If soap and water are not available, use hand sanitizer. ? Change your dressing as told by your health care provider. ? Leave stitches (sutures), skin glue, or adhesive strips in place. These skin closures may need to stay in place for 2 weeks or longer. If adhesive strip edges start to loosen and curl up, you may trim the loose edges. Do not remove adhesive strips completely unless your health care provider tells you to do that.  Check your port insertion site every day for signs of infection. Check for: ? Redness, swelling, or pain. ? Fluid or blood. ? Warmth. ? Pus or a bad smell.      Activity  Return to your normal activities as told by your health care provider. Ask your health care provider what activities are safe for you.  Do not  lift anything that is heavier than 10 lb (4.5 kg), or the limit that you are told, until your health care provider says that it is safe. General instructions  Take over-the-counter and prescription medicines only as told by your health care provider.  Do not take baths, swim, or use a hot tub until your health care provider approves. Ask your health care provider if you may take showers. You may only be allowed to take sponge baths.  Do not drive for 24 hours if you were given a sedative during your procedure.  Wear a medical alert bracelet in case of an emergency. This will tell any health care providers that you have a port.  Keep all follow-up visits as told by your health care provider. This is important. Contact a health care provider if:  You cannot flush your port with saline as directed, or you cannot draw blood from the port.  You have a fever or chills.  You have redness, swelling, or pain around your port insertion site.  You have fluid or blood coming from your port insertion site.  Your port insertion site feels warm to the touch.  You have pus or a bad smell coming from the port insertion site. Get help right away if:  You have chest pain or shortness of breath.  You have bleeding from your port that you cannot control. Summary  Take care of the port as told by your   health care provider. Keep the manufacturer's information card with you at all times.  Change your dressing as told by your health care provider.  Contact a health care provider if you have a fever or chills or if you have redness, swelling, or pain around your port insertion site.  Keep all follow-up visits as told by your health care provider. This information is not intended to replace advice given to you by your health care provider. Make sure you discuss any questions you have with your health care provider. Document Revised: 05/09/2018 Document Reviewed: 05/09/2018 Elsevier Patient Education   2021 Elsevier Inc.  

## 2020-12-11 NOTE — Progress Notes (Signed)
[place] NOTE  Patient Care Team: Pleas [name], NP as PCP - General (Internal Medicine) Festus Aloe, MD as Consulting Physician (Urology) Melida Quitter, MD as Consulting Physician (Otolaryngology) Karie Mainland, RD as Dietitian (Nutrition)  CHIEF COMPLAINTS/PURPOSE OF CONSULTATION:  SCC tonsil  ASSESSMENT & PLAN:  No problem-specific Assessment & Plan notes found for this encounter.  No orders of the defined types were placed in this encounter.  This is a 45 yr old male patient with newly diagnosed T3( primary tumor according to TB discussion is borderline 4 cm), N1( multiple lymph nodes ipsilateral but PET pending) and M0/Stage II  referred to medical oncology for treatment consideration We reviewed his imaging, pathology and discussed possible options for treatment in TB today. He followed up with ENT team at [facility] for consideration of surgery.   Given bilateral neck disease, recommendation was to proceed with concurrent chemoradiation. Given young age and excellent PS at baseline with no significant comorbidities, I recommended that he consider cisplatin every 21-day dosing at 100 mg/m for 3 doses concurrent with radiation.  We have discussed about side effects including and not limited to fatigue, nausea, increased risk of infection, ototoxicity with occasional permanent hearing loss, nephrotoxicity which can be permanent in patients etc. He started radiation on 10/28/2020, chemo delayed because of COVID Received first dose of 100 mg/m2 cisplatin on 11/07/2020 Second dose of cisplatin delayed due to neutropenia, administered on 12/03/2020 Excellent response to chemo radiation so far. We will omit the planned 3 rd dose of cisplatin since it cannot be administered concurrently with radiation. He understands that this was likely to happen since we had delays starting chemotherapy given COVID infection.  2. Treatment induced mucositis,  Severe  pain, using hydrocodone currently, will add long acting morphine 15 mg PO BID He says the pain is at it worst. He was instructed clearly to bring back unused tablets and he will only get 15 days worth of prescription I explained to him that it is not recommended to dispose medication in the toilet, he should bring them back and we can arrange for safe disposal.  3. Dysgeusia, weight loss.  He is using his PEG tube 4 of osmolite a day, eating small meals. Encouraged to keep his appointment with nurtition  4. Constipation, Encouraged to try dulcolax suppository PRN.  5. DM, type II, he can cut down insulin use given weight loss Encouraged to get a prescription for libre or dex com monitor since he hasnt been checking his glucose regularly, so his insulin dose can be modified. He will talk to his PCP  Labs reviewed, hypomagnesemia from chemotherapy He will get 4 gms of Mag tomorrow.  HISTORY OF PRESENTING ILLNESS:   CHRSITOPHER WIK 45 y.o. male is here because of new diagnosis of SCC tonsil/oropharynx.  HISTORY OF PRESENT ILLNESS:  MARCELLAS MARCHANT is a 45 y.o. male who presented with ongoing head cold symptoms, sinus issues, left ear pain, and left-sided throat pain since September.  When the cold-like symptoms resolved, the throat and ear pain did not. Augmentin was prescribed without relief.  Subsequently, the patient saw Dr. Redmond Baseman, ENT at [facility], who noted that the left tonsil had a mass appearance on 09/19/2020. 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  Pertinent imaging thus far includes soft tissue neck CT scan on 09/30/2020 revealed a left tonsillar mass abutting the glossotonsillar sulcus and base of the tongue, consistent  with known malignancy. Left level 2/3 cystic or necrotic adenopathy reflected nodal metastasis.  CT scan of chest performed on 09/30/2020 revealing a non-specific left lower lobe pulmonary micronodule but no acute  intrathoracic abnormality and no definite intrathoracic metastasis.  I have personally reviewed his imaging and discussed him with Dr. Redmond Baseman.    He had a PET scan which showed hypermetabolic left tonsillar mass and bilateral lymphadenopathy consistent with metastatic disease.  Hence recommendation was to proceed with concurrent chemoradiation.  He started radiation on 10/28/2020 chemotherapy delayed to 11/07/2020 because of COVID-19 infection.    He is here for a follow-up after cycle 2 of cisplatin chemotherapy.   Severe pain, he says its at its worse, using hydrocodone but may need his MS again, willing to take pills. No nausea or vomiting. He has medications if needed No fevers or chills.   No change in urination, hearing loss or new neuropathy. Continues to have issues with constipation, using suppository Mouth sores have become worse, trying to eat as much, uses 4 cans in his G tube. He is using ativan as needed for anxiety. Rest of the pertinent 10 point ROS reviewed and negative  MEDICAL HISTORY:  Past Medical History:  Diagnosis Date  . Diabetes mellitus without complication ([place])   . Erectile dysfunction   . Insomnia   . Skin abnormality    hidradenitis suppurativa     SURGICAL HISTORY: Past Surgical History:  Procedure Laterality Date  . IR GASTROSTOMY TUBE MOD SED  11/21/2020  . IR IMAGING GUIDED PORT INSERTION  11/21/2020  . IRRIGATION AND DEBRIDEMENT ABSCESS N/A 09/22/2018   Procedure: IRRIGATION AND DEBRIDEMENT ABSCESS;  Surgeon: Ralene Ok, MD;  Location: [facility];  Service: General;  Laterality: N/A;    SOCIAL HISTORY: Social History   Socioeconomic History  . Marital status: Married    Spouse name: Not on file  . Number of children: Not on file  . Years of education: Not on file  . Highest education level: Not on file  Occupational History  . Not on file  Tobacco Use  . Smoking status: Former Smoker    Packs/day: 1.00    Types: Cigarettes    Quit  date: 10/27/2020    Years since quitting: 0.1  . Smokeless tobacco: Former Systems developer    Quit date: 02/14/2016  Vaping Use  . Vaping Use: Never used  Substance and Sexual Activity  . Alcohol use: Not Currently    Alcohol/week: 5.0 standard drinks    Types: 5 Shots of liquor per week    Comment: occasinal   . Drug use: No  . Sexual activity: Yes  Other Topics Concern  . Not on file  Social History Narrative   ** Merged History Encounter **       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    Social Determinants of Health   Financial Resource Strain: Low Risk   . Difficulty of Paying Living Expenses: Not hard at all  Food Insecurity: No Food Insecurity  . Worried About Charity fundraiser in the Last Year: Never true  . Ran Out of Food in the Last Year: Never true  Transportation Needs: No Transportation Needs  . Lack of Transportation (Medical): No  . Lack of Transportation (Non-Medical): No  Physical Activity: Not on file  Stress: Not on file  Social Connections: Not on file  Intimate Partner Violence: Not on file    FAMILY HISTORY: Family History  Problem Relation Age of Onset  . Heart attack Father   . Emphysema Paternal Grandmother   . Arthritis Mother     ALLERGIES:  has No Known Allergies.  MEDICATIONS:  Current Outpatient Medications  Medication Sig Dispense Refill  . blood glucose meter kit and supplies KIT Dispense based on patient and insurance preference. Use up to four times daily as directed. (FOR ICD-9 250.00, 250.01). 1 each 0  . fluconazole (DIFLUCAN) 100 MG tablet Take 2 tablets today, and 1 tablet daily for 6 more days. Hold Rosuvastatin while on this. 8 tablet 0  . glucose blood test strip Use three times daily as instructed 100 each 3  . HYDROcodone-acetaminophen (HYCET) 7.5-325 mg/15 ml solution Take 10 mLs by mouth every 6 (six) hours as needed for moderate pain or severe pain. 473 mL 0  . insulin glargine (LANTUS)  100 UNIT/ML injection Inject 0.1 mLs (10 Units total) into the skin daily. 10 mL 2  . lidocaine (XYLOCAINE) 2 % solution Patient: Mix 1part 2% viscous lidocaine, 1part H20. Swish & swallow 26m of diluted mixture, 362m before meals and at bedtime, up to QID 200 mL 4  . lidocaine-prilocaine (EMLA) cream Apply to affected area once 30 g 3  . LORazepam (ATIVAN) 0.5 MG tablet Take 1 tablet (0.5 mg total) by mouth every 12 (twelve) hours as needed for anxiety (Nausea or vomiting, take 1-2 tabs as needed every 6 hrs for anxiety, nausea or vomiting). 20 tablet 0  . metFORMIN (GLUCOPHAGE XR) 500 MG 24 hr tablet Take 1 tablet (500 mg total) by mouth daily with breakfast. For diabetes. 90 tablet 3  . Nutritional Supplements (FEEDING SUPPLEMENT, OSMOLITE 1.5 CAL,) LIQD 2 cartons Osmolite 1.5 via PEG twice daily with 120 mL free water before and after.  1-1/2 cartons Osmolite 1.5 via PEG twice daily with 120 mL free water before and after.  Mix 30 mL Prosource no carb or equivalent with 30 mL water and flush with 210 mL water twice daily via PEG.  Provides 2605 cal, 134 g protein, 2707 mL free water/100% estimated needs.  0  . omeprazole (PRILOSEC) 20 MG capsule TAKE 1 CAPSULE BY MOUTH DAILY FOR HEARTBURN 90 capsule 3  . ondansetron (ZOFRAN) 8 MG tablet Take 1 tablet (8 mg total) by mouth 2 (two) times daily as needed. Start on the third day after cisplatin chemotherapy. 30 tablet 1  . oxymetazoline (AFRIN) 0.05 % nasal spray Place 1-2 sprays into both nostrils as needed for congestion.     . prochlorperazine (COMPAZINE) 10 MG tablet Take 1 tablet (10 mg total) by mouth every 6 (six) hours as needed (Nausea or vomiting). 30 tablet 1  . rosuvastatin (CRESTOR) 40 MG tablet Take 1 tablet (40 mg total) by mouth every evening. For cholesterol. 90 tablet 3  . scopolamine (TRANSDERM-SCOP) 1 MG/3DAYS Place 1 patch (1.5 mg total) onto the skin every 3 (three) days. 4 patch 5  . tadalafil (CIALIS) 5 MG tablet Take 1 tablet  (5 mg total) by mouth daily as needed for erectile dysfunction. 90 tablet 0   No current facility-administered medications for this visit.   Facility-Administered Medications Ordered in Other Visits  Medication Dose Route Frequency Provider Last Rate Last Admin  . 0.9 %  sodium chloride infusion   Intravenous Once IrBenay PikeMD         PHYSICAL EXAMINATION: ECOG PERFORMANCE STATUS: 0 - Asymptomatic  Vitals:   12/11/20 0821  BP: 103/68  Pulse: 63  Resp:  18  Temp: 97.8 F (36.6 C)  SpO2: 100%   Filed Weights   12/11/20 0821  Weight: 199 lb 3.2 oz (90.4 kg)    GENERAL:alert, no distress and comfortable SKIN: skin color, texture, turgor are normal, no rashes or significant lesions EYES: normal, conjunctiva are pink and non-injected, sclera clear Mucous membranes, severe mucositis. NECK: supple, thyroid normal size, tender, skin changes from ongoing radiation. LYMPH: NO palpable LN LUNGS: clear to auscultation and percussion with normal breathing effort HEART: regular rate & rhythm and no murmurs and no lower extremity edema ABDOMEN:abdomen soft, non-tender and normal bowel sounds, no guarding, rigidity Musculoskeletal:no cyanosis of digits and no clubbing  PSYCH: alert & oriented x 3 with fluent speech NEURO: no focal motor/sensory deficits  LABORATORY DATA:  I have reviewed the data as listed Lab Results  Component Value Date   WBC 6.3 12/11/2020   HGB 13.5 12/11/2020   HCT 38.6 (L) 12/11/2020   MCV 87.5 12/11/2020   PLT 190 12/11/2020     Chemistry      Component Value Date/Time   NA 136 12/11/2020 0816   NA 135 09/12/2019 1048   K 3.3 (L) 12/11/2020 0816   CL 98 12/11/2020 0816   CO2 29 12/11/2020 0816   BUN 25 (H) 12/11/2020 0816   BUN 18 09/12/2019 1048   CREATININE 1.28 (H) 12/11/2020 0816   CREATININE 1.38 (H) 11/30/2018 1102      Component Value Date/Time   CALCIUM 7.6 (L) 12/11/2020 0816   ALKPHOS 43 10/15/2020 1132   AST 35 10/15/2020 1132    ALT 79 (H) 10/15/2020 1132   BILITOT 0.8 10/15/2020 1132     I reviewed labs from today, will arrange for hydration and magnesium replacement  RADIOGRAPHIC STUDIES: I have personally reviewed the radiological images as listed and agreed with the findings in the report. IR Gastrostomy Tube  Result Date: 11/21/2020 INDICATION: Tonsillar carcinoma, radiation pharyngitis EXAM: FLUOROSCOPIC 20 FRENCH PULL-THROUGH GASTROSTOMY Date:  11/21/2020 11/21/2020 1:00 pm Radiologist:  M. Daryll Brod, MD Guidance:  Fluoroscopic MEDICATIONS: Ancef 2 g; Antibiotics were administered within 1 hour of the procedure. Glucagon 0.5 mg IV ANESTHESIA/SEDATION: Versed 1.0 mg IV; Fentanyl 100 mcg IV Moderate Sedation Time:  10 The patient was continuously monitored during the procedure by the interventional radiology nurse under my direct supervision. CONTRAST:  10 cc-administered into the gastric lumen. FLUOROSCOPY TIME:  Fluoroscopy Time: 1 minutes 54 seconds (46 mGy). COMPLICATIONS: None PROCEDURE: Informed consent was obtained from the patient following explanation of the procedure, risks, benefits and alternatives. The patient understands, agrees and consents for the procedure. All questions were addressed. A time out was performed. Maximal barrier sterile technique utilized including caps, mask, sterile gowns, sterile gloves, large sterile drape, hand hygiene, and betadine prep. The left upper quadrant was sterilely prepped and draped. An oral gastric catheter was inserted into the stomach under fluoroscopy. The existing nasogastric feeding tube was removed. Air was injected into the stomach for insufflation and visualization under fluoroscopy. The air distended stomach was confirmed beneath the anterior abdominal wall in the frontal and lateral projections. Under sterile conditions and local anesthesia, a 62 gauge trocar needle was utilized to access the stomach percutaneously beneath the left subcostal margin. Needle  position was confirmed within the stomach under biplane fluoroscopy. Contrast injection confirmed position also. A single T tack was deployed for gastropexy. Over an Amplatz guide wire, a 9-French sheath was inserted into the stomach. A snare device was utilized to capture  the oral gastric catheter. The snare device was pulled retrograde from the stomach up the esophagus and out the oropharynx. The 20-French pull-through gastrostomy was connected to the snare device and pulled antegrade through the oropharynx down the esophagus into the stomach and then through the percutaneous tract external to the patient. The gastrostomy was assembled externally. Contrast injection confirms position in the stomach. Images were obtained for documentation. The patient tolerated procedure well. No immediate complication. IMPRESSION: Fluoroscopic insertion of a 20-French "pull-through" gastrostomy. Electronically Signed   By: Jerilynn Mages.  Shick M.D.   On: 11/21/2020 13:09   IR IMAGING GUIDED PORT INSERTION  Result Date: 11/21/2020 CLINICAL DATA:  Tonsillar carcinoma EXAM: RIGHT INTERNAL JUGULAR SINGLE LUMEN POWER PORT CATHETER INSERTION Date:  11/21/2020 11/21/2020 12:31 pm Radiologist:  Jerilynn Mages. Daryll Brod, MD Guidance:  Ultrasound fluoroscopic MEDICATIONS: Ancef 2 g within 1 hour of the procedure; The antibiotic was administered within an appropriate time interval prior to skin puncture. ANESTHESIA/SEDATION: Versed 2 mg IV; Fentanyl 100 mcg IV; Moderate Sedation Time:  25 minutes The patient was continuously monitored during the procedure by the interventional radiology nurse under my direct supervision. FLUOROSCOPY TIME:  0 minutes, 36 seconds (12 mGy) COMPLICATIONS: None immediate. CONTRAST:  None. PROCEDURE: Informed consent was obtained from the patient following explanation of the procedure, risks, benefits and alternatives. The patient understands, agrees and consents for the procedure. All questions were addressed. A time out was  performed. Maximal barrier sterile technique utilized including caps, mask, sterile gowns, sterile gloves, large sterile drape, hand hygiene, and 2% chlorhexidine scrub. Under sterile conditions and local anesthesia, right internal jugular micropuncture venous access was performed. Access was performed with ultrasound. Images were obtained for documentation of the patent right internal jugular vein. A guide wire was inserted followed by a transitional dilator. This allowed insertion of a guide wire and catheter into the IVC. Measurements were obtained from the SVC / RA junction back to the right IJ venotomy site. In the right infraclavicular chest, a subcutaneous pocket was created over the second anterior rib. This was done under sterile conditions and local anesthesia. 1% lidocaine with epinephrine was utilized for this. A 2.5 cm incision was made in the skin. Blunt dissection was performed to create a subcutaneous pocket over the right pectoralis major muscle. The pocket was flushed with saline vigorously. There was adequate hemostasis. The port catheter was assembled and checked for leakage. The port catheter was secured in the pocket with two retention sutures. The tubing was tunneled subcutaneously to the right venotomy site and inserted into the SVC/RA junction through a valved peel-away sheath. Position was confirmed with fluoroscopy. Images were obtained for documentation. The patient tolerated the procedure well. No immediate complications. Incisions were closed in a two layer fashion with 4 - 0 Vicryl suture. Dermabond was applied to the skin. The port catheter was accessed, blood was aspirated followed by saline and heparin flushes. Needle was removed. A dry sterile dressing was applied. IMPRESSION: Ultrasound and fluoroscopically guided right internal jugular single lumen power port catheter insertion. Tip in the SVC/RA junction. Catheter ready for use. Electronically Signed   By: Jerilynn Mages.  Shick M.D.   On:  11/21/2020 12:59   Final Pathologic Diagnosis    TONSIL, BIOPSY:              Squamous cell carcinoma, HPV associated.  Electronically signed by Garnette Czech, MD on 09/25/2020 at 3:50 PM    All questions were answered. The patient knows to call the  clinic with any problems, questions or concerns.     Benay Pike, MD 12/11/2020 11:13 AM

## 2020-12-12 ENCOUNTER — Ambulatory Visit
Admission: RE | Admit: 2020-12-12 | Discharge: 2020-12-12 | Disposition: A | Discharge status: Home / Self Care | Payer: BC Managed Care – PPO | Source: Ambulatory Visit | Attending: Radiation Oncology | Admitting: Radiation Oncology

## 2020-12-12 ENCOUNTER — Other Ambulatory Visit: Payer: Self-pay | Admitting: Hematology and Oncology

## 2020-12-12 ENCOUNTER — Inpatient Hospital Stay: Payer: BC Managed Care – PPO

## 2020-12-12 DIAGNOSIS — C099 Malignant neoplasm of tonsil, unspecified: Secondary | ICD-10-CM | POA: Diagnosis not present

## 2020-12-12 DIAGNOSIS — C090 Malignant neoplasm of tonsillar fossa: Secondary | ICD-10-CM | POA: Diagnosis not present

## 2020-12-12 DIAGNOSIS — F17210 Nicotine dependence, cigarettes, uncomplicated: Secondary | ICD-10-CM | POA: Diagnosis not present

## 2020-12-12 NOTE — Progress Notes (Signed)
I called the patient. He was mad initially that we are finding excuses not to help him and he needs to send [place] multiple messages to get the medication, despite the fact that I actually sent the prescription yesterday. I explained to him again, that messages dont come instantly to me and sometimes with clinic schedule I cant get to them until I finish my clinic. He was instructed to call us if he needs something right away, he expressed understanding MS was sent to him yesterday as discussed. Then we are unable to refill hydrocodone until 2/23 and he understands this. We discussed that we are trying to help him and take care of him and we understand he is going through a lot at this time. He calmed down and agreed to proceed with care as planned He was given a choice to change doctors, and he didn't want to.  Justin Baldwin

## 2020-12-15 ENCOUNTER — Encounter: Payer: Self-pay | Admitting: Radiation Oncology

## 2020-12-15 ENCOUNTER — Ambulatory Visit
Admission: RE | Admit: 2020-12-15 | Discharge: 2020-12-15 | Disposition: A | Discharge status: Home / Self Care | Payer: BC Managed Care – PPO | Source: Ambulatory Visit | Attending: Radiation Oncology | Admitting: Radiation Oncology

## 2020-12-15 ENCOUNTER — Encounter: Payer: BC Managed Care – PPO | Admitting: Nutrition

## 2020-12-15 ENCOUNTER — Other Ambulatory Visit: Payer: Self-pay

## 2020-12-15 DIAGNOSIS — F17210 Nicotine dependence, cigarettes, uncomplicated: Secondary | ICD-10-CM | POA: Diagnosis not present

## 2020-12-15 DIAGNOSIS — C099 Malignant neoplasm of tonsil, unspecified: Secondary | ICD-10-CM | POA: Diagnosis not present

## 2020-12-15 DIAGNOSIS — C090 Malignant neoplasm of tonsillar fossa: Secondary | ICD-10-CM | POA: Diagnosis not present

## 2020-12-15 NOTE — Progress Notes (Signed)
Oncology Nurse Navigator Documentation  Met with Mr. Bacallao after final RT to offer support and to celebrate end of radiation treatment.   Provided verbal post-RT guidance:  Importance of keeping all follow-up appts, especially those with Nutrition and SLP.  Importance of protecting treatment area from sun.  Continuation of Sonafine application 2-3 times daily, application of antibiotic ointment to areas of raw skin; when supply of Sonafine exhausted transition to OTC lotion with vitamin E.  He missed follow up appointment with Garald Balding SLP on 2/17 and has agreed to see Glendell Docker in the outpatient setting. I have sent a message to [facility] and scheduling to call Mr. Leverich to schedule an appointment.  He is aware of upcoming follow up appointments as viewed in [place].  Explained my role as navigator will continue for several more months, encouraged him to call me with needs/concerns.    Harlow Asa RN, BSN, OCN Head & Neck Oncology Nurse [place] at [facility] Phone # [phone]  Fax # [phone]

## 2020-12-16 ENCOUNTER — Other Ambulatory Visit: Payer: Self-pay | Admitting: Internal Medicine

## 2020-12-16 MED ORDER — HYDROCODONE-ACETAMINOPHEN 7.5-325 MG/15ML PO SOLN: 10.0000 mL | Freq: Four times a day (QID) | ORAL | 0 refills | Status: DC | PRN

## 2020-12-17 ENCOUNTER — Inpatient Hospital Stay (HOSPITAL_BASED_OUTPATIENT_CLINIC_OR_DEPARTMENT_OTHER): Payer: BC Managed Care – PPO | Admitting: Hematology and Oncology

## 2020-12-17 ENCOUNTER — Inpatient Hospital Stay: Payer: BC Managed Care – PPO | Admitting: Nutrition

## 2020-12-17 ENCOUNTER — Other Ambulatory Visit: Payer: Self-pay

## 2020-12-17 ENCOUNTER — Inpatient Hospital Stay: Payer: BC Managed Care – PPO

## 2020-12-17 ENCOUNTER — Telehealth: Payer: Self-pay

## 2020-12-17 ENCOUNTER — Encounter: Payer: Self-pay | Admitting: Hematology and Oncology

## 2020-12-17 VITALS — BP 121/67 | HR 60 | Temp 98.2°F | Resp 18 | Ht 72.0 in | Wt 198.1 lb

## 2020-12-17 DIAGNOSIS — C090 Malignant neoplasm of tonsillar fossa: Secondary | ICD-10-CM

## 2020-12-17 DIAGNOSIS — G893 Neoplasm related pain (acute) (chronic): Secondary | ICD-10-CM

## 2020-12-17 DIAGNOSIS — C099 Malignant neoplasm of tonsil, unspecified: Secondary | ICD-10-CM | POA: Diagnosis not present

## 2020-12-17 DIAGNOSIS — Z87891 Personal history of nicotine dependence: Secondary | ICD-10-CM | POA: Diagnosis not present

## 2020-12-17 DIAGNOSIS — R634 Abnormal weight loss: Secondary | ICD-10-CM

## 2020-12-17 DIAGNOSIS — D701 Agranulocytosis secondary to cancer chemotherapy: Secondary | ICD-10-CM | POA: Diagnosis not present

## 2020-12-17 DIAGNOSIS — Z5111 Encounter for antineoplastic chemotherapy: Secondary | ICD-10-CM | POA: Diagnosis not present

## 2020-12-17 DIAGNOSIS — Z931 Gastrostomy status: Secondary | ICD-10-CM | POA: Diagnosis not present

## 2020-12-17 DIAGNOSIS — Z79899 Other long term (current) drug therapy: Secondary | ICD-10-CM | POA: Diagnosis not present

## 2020-12-17 DIAGNOSIS — Z224 Carrier of infections with a predominantly sexual mode of transmission: Secondary | ICD-10-CM | POA: Diagnosis not present

## 2020-12-17 DIAGNOSIS — Z79891 Long term (current) use of opiate analgesic: Secondary | ICD-10-CM | POA: Diagnosis not present

## 2020-12-17 DIAGNOSIS — N179 Acute kidney failure, unspecified: Secondary | ICD-10-CM | POA: Diagnosis not present

## 2020-12-17 DIAGNOSIS — K5903 Drug induced constipation: Secondary | ICD-10-CM

## 2020-12-17 DIAGNOSIS — Z8616 Personal history of COVID-19: Secondary | ICD-10-CM | POA: Diagnosis not present

## 2020-12-17 DIAGNOSIS — Z95828 Presence of other vascular implants and grafts: Secondary | ICD-10-CM

## 2020-12-17 DIAGNOSIS — K1231 Oral mucositis (ulcerative) due to antineoplastic therapy: Secondary | ICD-10-CM | POA: Diagnosis not present

## 2020-12-17 DIAGNOSIS — E860 Dehydration: Secondary | ICD-10-CM | POA: Diagnosis not present

## 2020-12-17 DIAGNOSIS — K5900 Constipation, unspecified: Secondary | ICD-10-CM | POA: Diagnosis not present

## 2020-12-17 LAB — CBC WITH DIFFERENTIAL/PLATELET
Abs Immature Granulocytes: 0.01 10*3/uL (ref 0.00–0.07)
Basophils Absolute: 0 10*3/uL (ref 0.0–0.1)
Basophils Relative: 0 %
Eosinophils Absolute: 0 10*3/uL (ref 0.0–0.5)
Eosinophils Relative: 0 %
HCT: 37.2 % — ABNORMAL LOW (ref 39.0–52.0)
Hemoglobin: 12.6 g/dL — ABNORMAL LOW (ref 13.0–17.0)
Immature Granulocytes: 0 %
Lymphocytes Relative: 11 %
Lymphs Abs: 0.4 10*3/uL — ABNORMAL LOW (ref 0.7–4.0)
MCH: 29.8 pg (ref 26.0–34.0)
MCHC: 33.9 g/dL (ref 30.0–36.0)
MCV: 87.9 fL (ref 80.0–100.0)
Monocytes Absolute: 0.2 10*3/uL (ref 0.1–1.0)
Monocytes Relative: 8 %
Neutro Abs: 2.5 10*3/uL (ref 1.7–7.7)
Neutrophils Relative %: 81 %
Platelets: 156 10*3/uL (ref 150–400)
RBC: 4.23 MIL/uL (ref 4.22–5.81)
RDW: 13.5 % (ref 11.5–15.5)
WBC: 3.1 10*3/uL — ABNORMAL LOW (ref 4.0–10.5)
nRBC: 0 % (ref 0.0–0.2)

## 2020-12-17 LAB — BASIC METABOLIC PANEL
Anion gap: 5 (ref 5–15)
BUN: 11 mg/dL (ref 6–20)
CO2: 28 mmol/L (ref 22–32)
Calcium: 8 mg/dL — ABNORMAL LOW (ref 8.9–10.3)
Chloride: 102 mmol/L (ref 98–111)
Creatinine, Ser: 1.03 mg/dL (ref 0.61–1.24)
GFR, Estimated: >60 mL/min (ref >60)
Glucose, Bld: 95 mg/dL (ref 70–99)
Potassium: 3.6 mmol/L (ref 3.5–5.1)
Sodium: 135 mmol/L (ref 135–145)

## 2020-12-17 LAB — MAGNESIUM: Magnesium: 1 mg/dL — ABNORMAL LOW (ref 1.7–2.4)

## 2020-12-17 MED ORDER — SODIUM CHLORIDE 0.9% FLUSH
10.0000 mL | Freq: Once | INTRAVENOUS | Status: AC
  Administered 2020-12-17: 10 mL
  Filled 2020-12-17: qty 10

## 2020-12-17 MED ORDER — HEPARIN SOD (PORK) LOCK FLUSH 100 UNIT/ML IV SOLN
500.0000 [IU] | Freq: Once | INTRAVENOUS | Status: AC
  Administered 2020-12-17: 500 [IU]
  Filled 2020-12-17: qty 5

## 2020-12-17 MED ORDER — HYDROCODONE-ACETAMINOPHEN 7.5-325 MG/15ML PO SOLN: 10.0000 mL | Freq: Four times a day (QID) | ORAL | 0 refills | Status: DC | PRN

## 2020-12-17 NOTE — Telephone Encounter (Signed)
Patient is aware of lab results and will go to [facility] on 2/25 to receive 4 grams of Mag. Orders placed under sign and held for the RN to release on Friday.

## 2020-12-17 NOTE — Patient Instructions (Signed)

## 2020-12-17 NOTE — Progress Notes (Signed)
Patient has completed treatment for tonsil cancer.  Contacted patient by telephone for follow-up.  Patient requested return call tomorrow.  Scheduled follow-up at 1:45 via telephone.

## 2020-12-17 NOTE — Telephone Encounter (Signed)
-----   Message from Benay Pike, MD sent at 12/17/2020 10:58 AM EST ----- Reviewed labs, will arrange for Magnesium replacement.

## 2020-12-17 NOTE — Progress Notes (Signed)
[place] NOTE  Patient Care Team: Pleas [name], NP as PCP - General (Internal Medicine) Festus Aloe, MD as Consulting Physician (Urology) Melida Quitter, MD as Consulting Physician (Otolaryngology) Karie Mainland, RD as Dietitian (Nutrition)  CHIEF COMPLAINTS/PURPOSE OF CONSULTATION:  SCC tonsil  ASSESSMENT & PLAN:  No problem-specific Assessment & Plan notes found for this encounter.  No orders of the defined types were placed in this encounter.  This is a 45 yr old male patient with newly diagnosed T3( primary tumor according to TB discussion is borderline 4 cm), N1( multiple lymph nodes ipsilateral but PET pending) and M0/Stage II  referred to medical oncology for treatment consideration We reviewed his imaging, pathology and discussed possible options for treatment in TB today. He followed up with ENT team at [facility] for consideration of surgery.   Given bilateral neck disease, recommendation was to proceed with concurrent chemoradiation. Given young age and excellent PS at baseline with no significant comorbidities, I recommended that he consider cisplatin every 21-day dosing at 100 mg/m for 3 doses concurrent with radiation.  We have discussed about side effects including and not limited to fatigue, nausea, increased risk of infection, ototoxicity with occasional permanent hearing loss, nephrotoxicity which can be permanent in patients etc. He started radiation on 10/28/2020, chemo delayed because of COVID Received first dose of 100 mg/m2 cisplatin on 11/07/2020 Second dose of cisplatin delayed due to neutropenia, administered on 12/03/2020 Excellent response to chemo radiation so far. We omitted the planned 3 rd dose of cisplatin since it cannot be administered concurrently with radiation. He understands that this was likely to happen since we had delays starting chemotherapy given COVID infection.  2. Treatment induced mucositis,  Severe  pain, using hydrocodone every 6 hrs and long acting morphine 15 mg PO BID He says the pain is at it worst. Again, he is not sure if Morphine helps. I told him since he is not seeing much difference with morphine, we will discontinue this. He was a little confused apparently if we would go back to liquid morphine Clarified this several times, he will only be getting Hydrocodone for pain management. IF he needs it for more than a month from now, he should FU with pain management for further pain control We will not be refilling pain meds long term. He understands, he says he hopefully doesn't need it for that long, We will refer him to Dr Davy Pique if he needs pain meds long term  3. Dysgeusia, weight loss.  He is using his PEG tube 4 of osmolite a day, eating much better He is drinking around 2 L of water a day. Weight loss is slowing down.  4. Constipation, using PRN enema, likely constipation is induced by pain medication  5. DM, type II,he apparently stopped his insulin, since his sugars have been ranging in 90's I encouraged him to make a FU with his PCP to manage sugars better since he may gain some of the weight back.  6. Hypomagnesemia, likely from renal wasting Will recommend replacement with 4 gms.  Labs reviewed, hypomagnesemia from chemotherapy He will get 4 gms of Mag, will arrange for this.  HISTORY OF PRESENTING ILLNESS:   Justin Baldwin 45 y.o. male is here because of new diagnosis of SCC tonsil/oropharynx.  HISTORY OF PRESENT ILLNESS:  Justin Baldwin is a 45 y.o. male who presented with ongoing head cold symptoms, sinus issues, left ear pain, and left-sided throat pain since September.  When the cold-like symptoms resolved, the throat and ear pain did not. Augmentin was prescribed without relief.  Subsequently, the patient saw Dr. Redmond Baseman, ENT at [facility], who noted that the left tonsil had a mass appearance on 09/19/2020. Biopsy of the left tonsil on that same day revealed:  squamous cell carcinoma, HPV related.  Pertinent imaging thus far includes soft tissue neck CT scan on 09/30/2020 revealed a left tonsillar mass abutting the glossotonsillar sulcus and base of the tongue, consistent with known malignancy. Left level 2/3 cystic or necrotic adenopathy reflected nodal metastasis.  CT scan of chest performed on 09/30/2020 revealing a non-specific left lower lobe pulmonary micronodule but no acute intrathoracic abnormality and no definite intrathoracic metastasis.  I have personally reviewed his imaging and discussed him with Dr. Redmond Baseman.    He had a PET scan which showed hypermetabolic left tonsillar mass and bilateral lymphadenopathy consistent with metastatic disease.  Hence recommendation was to proceed with concurrent chemoradiation.  He started radiation on 10/28/2020 chemotherapy delayed to 11/07/2020 because of COVID-19 infection.   Completed two cycles of cisplatin at a dose of 100 mg/m2.  Severe pain, he says its at its worse, using hydrocodone and MS. He is not sure if Morphine is helping him very much. Again, there appears to be some confusion with Morphine needs, overall he doesn't feel that Morphine helps him much. He is using hydrocodone every 6 hrs. No nausea or vomiting. He has medications if needed No fevers or chills.   No change in urination, hearing loss or new neuropathy.Sometime urine is darker Eating better, drinking water regularly Continues to have issues with constipation, using as needed enema. Eating better, weight dropped only by a pound in the last week. He stopped using ativan since he doesn't need it any more. Completed radiation on 12/15/2020 Rest of the pertinent 10 point ROS reviewed and negative  MEDICAL HISTORY:  Past Medical History:  Diagnosis Date  . Diabetes mellitus without complication ([place])   . Erectile dysfunction   . Insomnia   . Skin abnormality    hidradenitis suppurativa     SURGICAL HISTORY: Past Surgical  History:  Procedure Laterality Date  . IR GASTROSTOMY TUBE MOD SED  11/21/2020  . IR IMAGING GUIDED PORT INSERTION  11/21/2020  . IRRIGATION AND DEBRIDEMENT ABSCESS N/A 09/22/2018   Procedure: IRRIGATION AND DEBRIDEMENT ABSCESS;  Surgeon: Ralene Ok, MD;  Location: [facility];  Service: General;  Laterality: N/A;    SOCIAL HISTORY: Social History   Socioeconomic History  . Marital status: Married    Spouse name: Not on file  . Number of children: Not on file  . Years of education: Not on file  . Highest education level: Not on file  Occupational History  . Not on file  Tobacco Use  . Smoking status: Former Smoker    Packs/day: 1.00    Types: Cigarettes    Quit date: 10/27/2020    Years since quitting: 0.1  . Smokeless tobacco: Former Systems developer    Quit date: 02/14/2016  Vaping Use  . Vaping Use: Never used  Substance and Sexual Activity  . Alcohol use: Not Currently    Alcohol/week: 5.0 standard drinks    Types: 5 Shots of liquor per week    Comment: occasinal   . Drug use: No  . Sexual activity: Yes  Other Topics Concern  . Not on file  Social History Narrative   ** Merged History Encounter **       Married. 2  children. 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    Social Determinants of Health   Financial Resource Strain: Low Risk   . Difficulty of Paying Living Expenses: Not hard at all  Food Insecurity: No Food Insecurity  . Worried About Charity fundraiser in the Last Year: Never true  . Ran Out of Food in the Last Year: Never true  Transportation Needs: No Transportation Needs  . Lack of Transportation (Medical): No  . Lack of Transportation (Non-Medical): No  Physical Activity: Not on file  Stress: Not on file  Social Connections: Not on file  Intimate Partner Violence: Not on file    FAMILY HISTORY: Family History  Problem Relation Age of Onset  . Heart attack Father   . Emphysema Paternal Grandmother   . Arthritis Mother      ALLERGIES:  has No Known Allergies.  MEDICATIONS:  Current Outpatient Medications  Medication Sig Dispense Refill  . blood glucose meter kit and supplies KIT Dispense based on patient and insurance preference. Use up to four times daily as directed. (FOR ICD-9 250.00, 250.01). 1 each 0  . fluconazole (DIFLUCAN) 100 MG tablet Take 2 tablets today, and 1 tablet daily for 6 more days. Hold Rosuvastatin while on this. 8 tablet 0  . glucose blood test strip Use three times daily as instructed 100 each 3  . insulin glargine (LANTUS) 100 UNIT/ML injection Inject 0.1 mLs (10 Units total) into the skin daily. 10 mL 2  . lidocaine (XYLOCAINE) 2 % solution Patient: Mix 1part 2% viscous lidocaine, 1part H20. Swish & swallow 38m of diluted mixture, 352m before meals and at bedtime, up to QID 200 mL 4  . lidocaine-prilocaine (EMLA) cream Apply to affected area once 30 g 3  . metFORMIN (GLUCOPHAGE XR) 500 MG 24 hr tablet Take 1 tablet (500 mg total) by mouth daily with breakfast. For diabetes. 90 tablet 3  . Nutritional Supplements (FEEDING SUPPLEMENT, OSMOLITE 1.5 CAL,) LIQD 2 cartons Osmolite 1.5 via PEG twice daily with 120 mL free water before and after.  1-1/2 cartons Osmolite 1.5 via PEG twice daily with 120 mL free water before and after.  Mix 30 mL Prosource no carb or equivalent with 30 mL water and flush with 210 mL water twice daily via PEG.  Provides 2605 cal, 134 g protein, 2707 mL free water/100% estimated needs.  0  . omeprazole (PRILOSEC) 20 MG capsule TAKE 1 CAPSULE BY MOUTH DAILY FOR HEARTBURN 90 capsule 3  . ondansetron (ZOFRAN) 8 MG tablet Take 1 tablet (8 mg total) by mouth 2 (two) times daily as needed. Start on the third day after cisplatin chemotherapy. 30 tablet 1  . oxymetazoline (AFRIN) 0.05 % nasal spray Place 1-2 sprays into both nostrils as needed for congestion.     . prochlorperazine (COMPAZINE) 10 MG tablet Take 1 tablet (10 mg total) by mouth every 6 (six) hours as needed  (Nausea or vomiting). 30 tablet 1  . rosuvastatin (CRESTOR) 40 MG tablet Take 1 tablet (40 mg total) by mouth every evening. For cholesterol. 90 tablet 3  . scopolamine (TRANSDERM-SCOP) 1 MG/3DAYS Place 1 patch (1.5 mg total) onto the skin every 3 (three) days. 4 patch 5  . tadalafil (CIALIS) 5 MG tablet Take 1 tablet (5 mg total) by mouth daily as needed for erectile dysfunction. 90 tablet 0  . [START ON 12/19/2020] HYDROcodone-acetaminophen (HYCET) 7.5-325 mg/15 ml solution Take 10 mLs by mouth every 6 (six) hours as  needed for moderate pain or severe pain. 473 mL 0   No current facility-administered medications for this visit.     PHYSICAL EXAMINATION: ECOG PERFORMANCE STATUS: 0 - Asymptomatic  Vitals:   12/17/20 0917  BP: 121/67  Pulse: 60  Resp: 18  Temp: 98.2 F (36.8 C)  SpO2: 99%   Filed Weights   12/17/20 0917  Weight: 198 lb 2 oz (89.9 kg)    GENERAL:alert, no distress and comfortable SKIN: skin color, texture, turgor are normal, no rashes or significant lesions EYES: normal, conjunctiva are pink and non-injected, sclera clear Mucous membranes, severe mucositis. NECK: supple, thyroid normal size, tender, skin changes from ongoing radiation. LYMPH: NO palpable LN LUNGS: clear to auscultation and percussion with normal breathing effort HEART: regular rate & rhythm and no murmurs and no lower extremity edema ABDOMEN:abdomen soft, non-tender and normal bowel sounds, no guarding, rigidity Musculoskeletal:no cyanosis of digits and no clubbing  PSYCH: alert & oriented x 3 with fluent speech NEURO: no focal motor/sensory deficits  LABORATORY DATA:  I have reviewed the data as listed Lab Results  Component Value Date   WBC 3.1 (L) 12/17/2020   HGB 12.6 (L) 12/17/2020   HCT 37.2 (L) 12/17/2020   MCV 87.9 12/17/2020   PLT 156 12/17/2020     Chemistry      Component Value Date/Time   NA 135 12/17/2020 0904   NA 135 09/12/2019 1048   K 3.6 12/17/2020 0904   CL 102  12/17/2020 0904   CO2 28 12/17/2020 0904   BUN 11 12/17/2020 0904   BUN 18 09/12/2019 1048   CREATININE 1.03 12/17/2020 0904   CREATININE 1.28 (H) 12/11/2020 0816   CREATININE 1.38 (H) 11/30/2018 1102      Component Value Date/Time   CALCIUM 8.0 (L) 12/17/2020 0904   ALKPHOS 43 10/15/2020 1132   AST 35 10/15/2020 1132   ALT 79 (H) 10/15/2020 1132   BILITOT 0.8 10/15/2020 1132     I reviewed labs from today, will arrange for hydration and magnesium replacement  RADIOGRAPHIC STUDIES: I have personally reviewed the radiological images as listed and agreed with the findings in the report. IR Gastrostomy Tube  Result Date: 11/21/2020 INDICATION: Tonsillar carcinoma, radiation pharyngitis EXAM: FLUOROSCOPIC 20 FRENCH PULL-THROUGH GASTROSTOMY Date:  11/21/2020 11/21/2020 1:00 pm Radiologist:  M. Daryll Brod, MD Guidance:  Fluoroscopic MEDICATIONS: Ancef 2 g; Antibiotics were administered within 1 hour of the procedure. Glucagon 0.5 mg IV ANESTHESIA/SEDATION: Versed 1.0 mg IV; Fentanyl 100 mcg IV Moderate Sedation Time:  10 The patient was continuously monitored during the procedure by the interventional radiology nurse under my direct supervision. CONTRAST:  10 cc-administered into the gastric lumen. FLUOROSCOPY TIME:  Fluoroscopy Time: 1 minutes 54 seconds (46 mGy). COMPLICATIONS: None PROCEDURE: Informed consent was obtained from the patient following explanation of the procedure, risks, benefits and alternatives. The patient understands, agrees and consents for the procedure. All questions were addressed. A time out was performed. Maximal barrier sterile technique utilized including caps, mask, sterile gowns, sterile gloves, large sterile drape, hand hygiene, and betadine prep. The left upper quadrant was sterilely prepped and draped. An oral gastric catheter was inserted into the stomach under fluoroscopy. The existing nasogastric feeding tube was removed. Air was injected into the stomach for  insufflation and visualization under fluoroscopy. The air distended stomach was confirmed beneath the anterior abdominal wall in the frontal and lateral projections. Under sterile conditions and local anesthesia, a 17 gauge trocar needle was utilized to access  the stomach percutaneously beneath the left subcostal margin. Needle position was confirmed within the stomach under biplane fluoroscopy. Contrast injection confirmed position also. A single T tack was deployed for gastropexy. Over an Amplatz guide wire, a 9-French sheath was inserted into the stomach. A snare device was utilized to capture the oral gastric catheter. The snare device was pulled retrograde from the stomach up the esophagus and out the oropharynx. The 20-French pull-through gastrostomy was connected to the snare device and pulled antegrade through the oropharynx down the esophagus into the stomach and then through the percutaneous tract external to the patient. The gastrostomy was assembled externally. Contrast injection confirms position in the stomach. Images were obtained for documentation. The patient tolerated procedure well. No immediate complication. IMPRESSION: Fluoroscopic insertion of a 20-French "pull-through" gastrostomy. Electronically Signed   By: Jerilynn Mages.  Shick M.D.   On: 11/21/2020 13:09   IR IMAGING GUIDED PORT INSERTION  Result Date: 11/21/2020 CLINICAL DATA:  Tonsillar carcinoma EXAM: RIGHT INTERNAL JUGULAR SINGLE LUMEN POWER PORT CATHETER INSERTION Date:  11/21/2020 11/21/2020 12:31 pm Radiologist:  Jerilynn Mages. Daryll Brod, MD Guidance:  Ultrasound fluoroscopic MEDICATIONS: Ancef 2 g within 1 hour of the procedure; The antibiotic was administered within an appropriate time interval prior to skin puncture. ANESTHESIA/SEDATION: Versed 2 mg IV; Fentanyl 100 mcg IV; Moderate Sedation Time:  25 minutes The patient was continuously monitored during the procedure by the interventional radiology nurse under my direct supervision. FLUOROSCOPY  TIME:  0 minutes, 36 seconds (12 mGy) COMPLICATIONS: None immediate. CONTRAST:  None. PROCEDURE: Informed consent was obtained from the patient following explanation of the procedure, risks, benefits and alternatives. The patient understands, agrees and consents for the procedure. All questions were addressed. A time out was performed. Maximal barrier sterile technique utilized including caps, mask, sterile gowns, sterile gloves, large sterile drape, hand hygiene, and 2% chlorhexidine scrub. Under sterile conditions and local anesthesia, right internal jugular micropuncture venous access was performed. Access was performed with ultrasound. Images were obtained for documentation of the patent right internal jugular vein. A guide wire was inserted followed by a transitional dilator. This allowed insertion of a guide wire and catheter into the IVC. Measurements were obtained from the SVC / RA junction back to the right IJ venotomy site. In the right infraclavicular chest, a subcutaneous pocket was created over the second anterior rib. This was done under sterile conditions and local anesthesia. 1% lidocaine with epinephrine was utilized for this. A 2.5 cm incision was made in the skin. Blunt dissection was performed to create a subcutaneous pocket over the right pectoralis major muscle. The pocket was flushed with saline vigorously. There was adequate hemostasis. The port catheter was assembled and checked for leakage. The port catheter was secured in the pocket with two retention sutures. The tubing was tunneled subcutaneously to the right venotomy site and inserted into the SVC/RA junction through a valved peel-away sheath. Position was confirmed with fluoroscopy. Images were obtained for documentation. The patient tolerated the procedure well. No immediate complications. Incisions were closed in a two layer fashion with 4 - 0 Vicryl suture. Dermabond was applied to the skin. The port catheter was accessed, blood was  aspirated followed by saline and heparin flushes. Needle was removed. A dry sterile dressing was applied. IMPRESSION: Ultrasound and fluoroscopically guided right internal jugular single lumen power port catheter insertion. Tip in the SVC/RA junction. Catheter ready for use. Electronically Signed   By: Jerilynn Mages.  Shick M.D.   On: 11/21/2020 12:59   Final  Pathologic Diagnosis    TONSIL, BIOPSY:              Squamous cell carcinoma, HPV associated.  Electronically signed by Garnette Czech, MD on 09/25/2020 at 3:50 PM    All questions were answered. The patient knows to call the clinic with any problems, questions or concerns.     Benay Pike, MD 12/17/2020 10:32 AM

## 2020-12-18 ENCOUNTER — Inpatient Hospital Stay: Payer: BC Managed Care – PPO | Admitting: Nutrition

## 2020-12-18 ENCOUNTER — Ambulatory Visit: Payer: BC Managed Care – PPO

## 2020-12-18 ENCOUNTER — Telehealth: Payer: Self-pay | Admitting: Nutrition

## 2020-12-18 ENCOUNTER — Other Ambulatory Visit: Payer: BC Managed Care – PPO

## 2020-12-18 ENCOUNTER — Encounter: Payer: BC Managed Care – PPO | Admitting: Nutrition

## 2020-12-18 ENCOUNTER — Ambulatory Visit: Payer: BC Managed Care – PPO | Admitting: Hematology and Oncology

## 2020-12-18 ENCOUNTER — Telehealth: Payer: Self-pay | Admitting: Hematology and Oncology

## 2020-12-18 NOTE — Telephone Encounter (Signed)
Contacted patient by phone per his request.  Patient reports he is definitely eating better.  Taste has not really improved much but he is pushing oral intake.  He only lost 1 pound this past week.  Current weight documented as 198.13 pounds on February 23 and 199.2 pounds February 17.  He is drinking at least 4 bottles of water.  Reports constipation has improved.  He continues 4 cartons of Osmolite 1.5 via PEG without difficulty.  Nutrition diagnosis: Inadequate oral intake has improved.  Estimated nutrition needs: 2400-2600 cal, 120-140 g protein, 2.6 L fluid.  Intervention: Educated to continue strategies for increasing calories and protein by mouth. Continue additional hydration as well as 4 cartons Osmolite 1.5 daily via PEG. Continue bowel regimen.  Monitoring, evaluation, goals: Patient will tolerate adequate calories and protein to minimize weight loss.  Next visit: Nutrition follow-up by phone in approximately 4 weeks.  **Disclaimer: This note was dictated with voice recognition software. Similar sounding words can inadvertently be transcribed and this note may contain transcription errors which may not have been corrected upon publication of note.**

## 2020-12-18 NOTE — Progress Notes (Signed)
See telephone note.

## 2020-12-18 NOTE — Telephone Encounter (Signed)
Scheduled appointment per 2/23 los. Updated appointment will be available in [place]. Patient prefers to check MyChart for all upcoming appointments.

## 2020-12-19 ENCOUNTER — Other Ambulatory Visit: Payer: Self-pay

## 2020-12-19 ENCOUNTER — Inpatient Hospital Stay: Payer: BC Managed Care – PPO

## 2020-12-19 ENCOUNTER — Inpatient Hospital Stay (HOSPITAL_COMMUNITY): Payer: BC Managed Care – PPO | Attending: Hematology

## 2020-12-19 VITALS — BP 129/72 | HR 66 | Temp 97.1°F | Resp 18 | Wt 197.6 lb

## 2020-12-19 DIAGNOSIS — Z95828 Presence of other vascular implants and grafts: Secondary | ICD-10-CM

## 2020-12-19 MED ORDER — SODIUM CHLORIDE 0.9 % IV SOLN
4.0000 g | Freq: Once | INTRAVENOUS | Status: AC
  Administered 2020-12-19: 4 g via INTRAVENOUS
  Filled 2020-12-19: qty 8

## 2020-12-19 MED ORDER — SODIUM CHLORIDE 0.9% FLUSH
10.0000 mL | INTRAVENOUS | Status: DC | PRN
  Administered 2020-12-19: 10 mL via INTRAVENOUS

## 2020-12-19 MED ORDER — HEPARIN SOD (PORK) LOCK FLUSH 100 UNIT/ML IV SOLN
500.0000 [IU] | Freq: Once | INTRAVENOUS | Status: AC
  Administered 2020-12-19: 500 [IU] via INTRAVENOUS

## 2020-12-19 NOTE — Progress Notes (Signed)
Tolerated infusion w/o adverse reaction.  Alert, in no distress.  VSS.  Discharged ambulatory in stable condition.  

## 2020-12-22 ENCOUNTER — Ambulatory Visit: Payer: BC Managed Care – PPO

## 2020-12-22 ENCOUNTER — Other Ambulatory Visit: Payer: Self-pay

## 2020-12-22 DIAGNOSIS — F419 Anxiety disorder, unspecified: Secondary | ICD-10-CM | POA: Diagnosis not present

## 2020-12-22 DIAGNOSIS — R601 Generalized edema: Secondary | ICD-10-CM

## 2020-12-22 DIAGNOSIS — R293 Abnormal posture: Secondary | ICD-10-CM | POA: Diagnosis not present

## 2020-12-22 DIAGNOSIS — C099 Malignant neoplasm of tonsil, unspecified: Secondary | ICD-10-CM | POA: Diagnosis not present

## 2020-12-22 NOTE — Therapy (Signed)
[place], [place], [address] Phone: [phone]   Fax:  [phone]  Physical Therapy Evaluation  Patient Details  Name: Justin Baldwin MRN: [id number] Date of Birth: 1975-12-10 Referring Provider (PT): Dr. Isidore Moos   Encounter Date: 12/22/2020   PT End of Session - 12/22/20 1232    Visit Number 1    Number of Visits 6    Date for PT Re-Evaluation 02/02/21    PT Start Time 1012    PT Stop Time 1053    PT Time Calculation (min) 41 min    Activity Tolerance Patient tolerated treatment well    Behavior During Therapy [facility] for tasks assessed/performed           Past Medical History:  Diagnosis Date  . Diabetes mellitus without complication ([place])   . Erectile dysfunction   . Insomnia   . Skin abnormality    hidradenitis suppurativa     Past Surgical History:  Procedure Laterality Date  . IR GASTROSTOMY TUBE MOD SED  11/21/2020  . IR IMAGING GUIDED PORT INSERTION  11/21/2020  . IRRIGATION AND DEBRIDEMENT ABSCESS N/A 09/22/2018   Procedure: IRRIGATION AND DEBRIDEMENT ABSCESS;  Surgeon: Ralene Ok, MD;  Location: [facility];  Service: General;  Laterality: N/A;    There were no vitals filed for this visit.    Subjective Assessment - 12/22/20 1014    Subjective After the radiation of  his throat he developed alot of sores under tongue and inside mouth.   Even soups are too acidic to eat.  Talking and brushing teeth is a problem. He noticed some swelling at anterior neck but it may be because he lost 40 Lbs and used to have a very muscular neck.  the mouth sores are very bothersome to him.  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  He can come to therapy 1x per week.    Pertinent History Pt had a cold in September and throat pain and ear pain did not resolve.  He had several rounds of antibiotics with no improvement.  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 He had concurrent chemo and radiation.  He currently has a Peg and has lost 40 lbs since his treatment began.    Currently in Pain? Yes    Pain Score 7     Pain Location Mouth    Pain Orientation Right;Left    Pain Descriptors / Indicators Constant    Pain Type Acute pain    Pain Onset 1 to 4 weeks ago    Pain Frequency Constant    Aggravating Factors  talking, eating,    Pain Relieving Factors Lidocaine    Effect of Pain on Daily Activities disturbed sleep    Multiple Pain Sites No              [facility] PT Assessment - 12/22/20 0001      Assessment   Medical Diagnosis Left tonsilar squamos Cell Carcinoma    Referring Provider (PT) Dr. Isidore Moos    Onset Date/Surgical Date 09/19/20    Hand Dominance Right    Prior Therapy no      Precautions   Precaution Comments lymphedema      Restrictions   Weight Bearing Restrictions No      Balance Screen   Has  the patient fallen in the past 6 months No    Has the patient had a decrease in activity level because of a fear of falling?  No    Is the patient reluctant to leave their home because of a fear of falling?  No      Home Social worker Private residence    Living Arrangements Spouse/significant other;Children   2 children   Available Help at Discharge Family      Prior Function   Level of Independence Independent    Vocation Full time employment    Vocation Requirements English as a second language teacher    Leisure likes to work out but has not returned since treatment      Cognition   Overall Cognitive Status Within Functional Limits for tasks assessed      Observation/Other Assessments-Edema    Edema --   possibly at anterior throat.  Difficult to tell as he has lost so much wt.     Posture/Postural Control   Posture/Postural Control Postural limitations    Postural Limitations Rounded Shoulders;Forward  head      AROM   Overall AROM Comments Bilateral shoulders WNL    Cervical Flexion WNL    Cervical Extension tight; hurts throat inside    Cervical - Right Side Bend 50    Cervical - Left Side Bend 42    Cervical - Right Rotation 58    Cervical - Left Rotation 62             LYMPHEDEMA/ONCOLOGY QUESTIONNAIRE - 12/22/20 0001      Type   Cancer Type Left tonsilar squamos cell Carcinoma      Treatment   Active Chemotherapy Treatment No    Past Chemotherapy Treatment Yes    Date 11/07/20    Active Radiation Treatment No    Past Radiation Treatment Yes   11/02/2020   Body Site bilateral neck      What other symptoms do you have   Are you Having Heaviness or Tightness Yes    Are you having Pain Yes    Are you having pitting edema No    Is it Hard or Difficult finding clothes that fit Yes    Do you have infections No    Is there Decreased scar mobility No    Stemmer Sign No      Head and Neck   4 cm superior to sternal notch around neck 41.2 cm    6 cm superior to sternal notch around neck 40.9 cm    8 cm superior to sternal notch around neck 41.2 cm    Other tragus to tragus   21.5                  Objective measurements completed on examination: See above findings.               PT Education - 12/22/20 1058    Education Details Pt was educated in proper sitting posture with lumba roll.  He was educated in cervical ROM exercises to perform in all directions with comfortable stretch only .  He was also educated on importance of a walking program starting with short periods and progressing as he feels up to it.    Person(s) Educated Patient    Methods Explanation;Handout;Demonstration    Comprehension Verbalized understanding;Returned demonstration               PT Long Term Goals - 12/22/20 1510  PT LONG TERM GOAL #1   Title Pt will be independent in a HEP for cervical ROM and will note decreased tightness and improved mobility    Time  4    Period Weeks    Status New    Target Date 01/19/21      PT LONG TERM GOAL #2   Title Pt will be independent in self neck MLD    Time 6    Period Weeks    Status New    Target Date 01/27/21      PT LONG TERM GOAL #3   Title Pt will have appropriate compression garments prn    Time 6    Period Weeks    Status New    Target Date 02/02/21                  Plan - 12/22/20 1233    Clinical Impression Statement Pt is s/p concurrent chemo and radiation for Left Tonsilar squamos Cell Carcinoma.  He presents with complaints of significant mouth pain from sores for which he uses lidocaine.  He is very uncomfotable with this.  AROM of bilateral shoulders is WNL.  He has significant forward head posture, from his job as a English as a second language teacher and also because his Peg bothers him to sit up straight.  Cervical ROM is limited with some tightness in the neck, but with increased pain in his throat and mouth.  He lost 40 lbs during treatment and just recently started gaining back a few pounds. He has what appears to be mild swelling under his chin, however, he also thinks it could just be because he has lost so much weight and he used to have a strong muscular neck.  He was given the cervical handouts for ROM and beginning a HEP.  We also discussed and was shown proper sitting/standing posture. He will benefit from PT but can only come once a week.  May try chip pack and instruct MLD, and consider light strengthening exs.    Personal Factors and Comorbidities Comorbidity 1;Comorbidity 2    Comorbidities Tonsilar cancer s/p chemo/radiation    Examination-Activity Limitations Sleep;Other   eating secondary to mouth sores   Stability/Clinical Decision Making Stable/Uncomplicated    Clinical Decision Making Low    Rehab Potential Good    PT Frequency 1x / week    PT Duration 6 weeks    PT Treatment/Interventions ADLs/Self Care Home Management;Therapeutic exercise;Manual techniques;Patient/family  education;Manual lymph drainage;Passive range of motion;Vasopneumatic Device    PT Next Visit Plan cervical ROM/PROM, instruct self neck MLD, chip pack, gentle theraband    PT Home Exercise Plan cervical ROM, proper posture, walking program    Consulted and Agree with Plan of Care Patient           Patient will benefit from skilled therapeutic intervention in order to improve the following deficits and impairments:  Decreased mobility,Decreased strength,Increased edema,Postural dysfunction,Decreased skin integrity,Decreased knowledge of precautions,Decreased endurance,Pain  Visit Diagnosis: Malignant neoplasm of tonsil ([facility])  Abnormal posture  Generalized edema     Problem List Patient Active Problem List   Diagnosis Date Noted  . Port-A-Cath in place 12/04/2020  . Cancer of tonsillar fossa ([place]) 10/01/2020  . Type 2 diabetes mellitus without complication, without long-term current use of insulin ([place]) 09/03/2019  . Hyperglycemia 09/03/2019  . Gastroesophageal reflux disease 03/06/2019  . Atherosclerosis of aorta ([place]) 11/30/2018  . Dizziness 11/30/2018  . Medication monitoring encounter 10/31/2018  . Atypical mycobacterium infection   .  Abscess of buttock, left 09/22/2018  . Hydradenitis 09/22/2018  . Kidney insufficiency 09/22/2018  . Lump 08/31/2018  . Deep tissue injury 08/25/2018  . Skin rash 07/25/2018  . Fatigue 11/08/2017  . Vertigo 07/11/2017  . Preventative health care 03/29/2016  . Insomnia 03/15/2016  . Erectile dysfunction 03/15/2016    Claris Pong 12/22/2020, 3:15 PM  [place], [place], [address] Phone: [phone]   Fax:  [phone]  Name: GAREK SCHUNEMAN MRN: [id number] Date of Birth: [birth year] Cheral Almas, PT 12/22/20 3:16 PM

## 2020-12-22 NOTE — Patient Instructions (Signed)
Pt was given instructions for proper sitting posture and Active Cervical ROM in all directions x 5 going to gentle stretch/minimal discomfort only.  We discussed importance of starting a walking program, starting slowly and building up to longer time up to 30 min. He was also given handouts for return to exercise importance.

## 2020-12-23 ENCOUNTER — Other Ambulatory Visit: Payer: Self-pay | Admitting: Radiation Oncology

## 2020-12-23 DIAGNOSIS — C090 Malignant neoplasm of tonsillar fossa: Secondary | ICD-10-CM

## 2020-12-23 MED ORDER — SCOPOLAMINE 1 MG/3DAYS TD PT72: 1.0000 | MEDICATED_PATCH | TRANSDERMAL | 1 refills | Status: DC

## 2020-12-24 ENCOUNTER — Other Ambulatory Visit: Payer: Self-pay | Admitting: Hematology and Oncology

## 2020-12-24 ENCOUNTER — Telehealth: Payer: Self-pay

## 2020-12-24 NOTE — Progress Notes (Signed)
I called him back about the urinary issues. He mentions that the first urine in the morning is when he notices it. He has the urge to pee, but he cant get it out without straining, when he actually pees, the urine is straw colored and no blood. He is drinking water regularly I believe this could be medication related including the opioids vs BPH, mild. He was instructed to monitor it and reach out to me if he has worsening symptoms or hematuria. Last labs with normal creatinine.  He expressed understanding of the plan.  Justin Baldwin

## 2020-12-25 ENCOUNTER — Other Ambulatory Visit: Payer: BC Managed Care – PPO

## 2020-12-25 ENCOUNTER — Ambulatory Visit: Payer: BC Managed Care – PPO | Admitting: Hematology and Oncology

## 2020-12-25 ENCOUNTER — Ambulatory Visit: Payer: BC Managed Care – PPO

## 2020-12-25 ENCOUNTER — Other Ambulatory Visit: Payer: Self-pay

## 2020-12-25 ENCOUNTER — Encounter: Payer: Self-pay | Admitting: Hematology and Oncology

## 2020-12-25 DIAGNOSIS — C099 Malignant neoplasm of tonsil, unspecified: Secondary | ICD-10-CM

## 2020-12-25 NOTE — Telephone Encounter (Signed)
Reference MyChart messages under "encounters" tab

## 2020-12-26 ENCOUNTER — Telehealth: Payer: Self-pay

## 2020-12-26 ENCOUNTER — Other Ambulatory Visit: Payer: Self-pay

## 2020-12-26 DIAGNOSIS — C090 Malignant neoplasm of tonsillar fossa: Secondary | ICD-10-CM

## 2020-12-26 MED ORDER — NYSTATIN 100000 UNIT/ML MT SUSP: 5.0000 mL | Freq: Four times a day (QID) | OROMUCOSAL | 0 refills | Status: DC | PRN

## 2020-12-26 NOTE — Telephone Encounter (Signed)
[name] reached out to [place] today via MyChart to inform us that his mucositis was causing him a lot of pain (blisters in mouth, difficulty swallowing and speaking, dryness). Dr. Chryl Heck is not able to prescribe him anymore of the Hydrocodone-Acetaminophen solution until 3/9. The patient is aware that we are currently working on expediting a referral to the pain clinic.   Until then, the patient has been advised per Dr. Chryl Heck to try magic mouth wash to help his mouth/throat pain. I advised [name] that it usually takes about 3 weeks post treatment before we begin to see patient's really improving. I advised the patient that the pain clinic is closed on Friday but our team would be following up with them on Monday to check the status of the referral. [name] has a follow up with Dr. Isidore Moos and Dr. Chryl Heck on 3/8. I advised him to contact us with any questions or concerns.

## 2020-12-29 ENCOUNTER — Telehealth: Payer: Self-pay | Admitting: *Deleted

## 2020-12-29 ENCOUNTER — Other Ambulatory Visit: Payer: Self-pay | Admitting: Hematology and Oncology

## 2020-12-29 ENCOUNTER — Ambulatory Visit: Payer: BC Managed Care – PPO

## 2020-12-29 DIAGNOSIS — C090 Malignant neoplasm of tonsillar fossa: Secondary | ICD-10-CM

## 2020-12-29 MED ORDER — HYDROCODONE-ACETAMINOPHEN 7.5-325 MG/15ML PO SOLN: 10.0000 mL | Freq: Four times a day (QID) | ORAL | 0 refills | Status: DC | PRN

## 2020-12-29 MED ORDER — LIDOCAINE VISCOUS HCL 2 % MT SOLN: OROMUCOSAL | 4 refills | Status: DC

## 2020-12-29 NOTE — Progress Notes (Signed)
Viscous lidocaine and hydrocodone prescription sent. Patient called and informed.

## 2020-12-29 NOTE — Telephone Encounter (Signed)
Received vm call from pt stating that he needs pain meds or to see a clinical person or least get a refill on his  Viscous lidocaine.  He reports being out of everything & is desperate.   Returned call & informed pt that message will be sent to Dr Chryl Heck.  He confirms being out of his pain med.  Scripts need to go to [employer].

## 2020-12-30 ENCOUNTER — Encounter: Payer: BC Managed Care – PPO | Admitting: Nutrition

## 2020-12-30 ENCOUNTER — Other Ambulatory Visit: Payer: Self-pay

## 2020-12-30 ENCOUNTER — Inpatient Hospital Stay (HOSPITAL_BASED_OUTPATIENT_CLINIC_OR_DEPARTMENT_OTHER): Payer: BC Managed Care – PPO | Admitting: Hematology and Oncology

## 2020-12-30 ENCOUNTER — Inpatient Hospital Stay: Payer: BC Managed Care – PPO | Attending: Hematology and Oncology

## 2020-12-30 ENCOUNTER — Ambulatory Visit
Admission: RE | Admit: 2020-12-30 | Discharge: 2020-12-30 | Disposition: A | Discharge status: Home / Self Care | Payer: BC Managed Care – PPO | Source: Ambulatory Visit | Attending: Radiation Oncology | Admitting: Radiation Oncology

## 2020-12-30 ENCOUNTER — Telehealth: Payer: Self-pay | Admitting: Hematology and Oncology

## 2020-12-30 VITALS — BP 116/70 | HR 63 | Temp 97.0°F | Resp 18 | Ht 72.0 in | Wt 190.0 lb

## 2020-12-30 VITALS — BP 115/64 | HR 67 | Temp 97.7°F | Resp 18

## 2020-12-30 DIAGNOSIS — E119 Type 2 diabetes mellitus without complications: Secondary | ICD-10-CM | POA: Insufficient documentation

## 2020-12-30 DIAGNOSIS — C090 Malignant neoplasm of tonsillar fossa: Secondary | ICD-10-CM | POA: Insufficient documentation

## 2020-12-30 DIAGNOSIS — F418 Other specified anxiety disorders: Secondary | ICD-10-CM | POA: Diagnosis not present

## 2020-12-30 DIAGNOSIS — K1231 Oral mucositis (ulcerative) due to antineoplastic therapy: Secondary | ICD-10-CM

## 2020-12-30 DIAGNOSIS — Z87891 Personal history of nicotine dependence: Secondary | ICD-10-CM | POA: Insufficient documentation

## 2020-12-30 DIAGNOSIS — K5900 Constipation, unspecified: Secondary | ICD-10-CM | POA: Insufficient documentation

## 2020-12-30 DIAGNOSIS — C099 Malignant neoplasm of tonsil, unspecified: Secondary | ICD-10-CM

## 2020-12-30 DIAGNOSIS — G893 Neoplasm related pain (acute) (chronic): Secondary | ICD-10-CM

## 2020-12-30 DIAGNOSIS — Z79899 Other long term (current) drug therapy: Secondary | ICD-10-CM | POA: Diagnosis not present

## 2020-12-30 DIAGNOSIS — K5903 Drug induced constipation: Secondary | ICD-10-CM

## 2020-12-30 DIAGNOSIS — R110 Nausea: Secondary | ICD-10-CM | POA: Diagnosis not present

## 2020-12-30 DIAGNOSIS — R634 Abnormal weight loss: Secondary | ICD-10-CM | POA: Diagnosis not present

## 2020-12-30 DIAGNOSIS — Z923 Personal history of irradiation: Secondary | ICD-10-CM | POA: Insufficient documentation

## 2020-12-30 LAB — CBC WITH DIFFERENTIAL/PLATELET
Abs Immature Granulocytes: 0.01 10*3/uL (ref 0.00–0.07)
Basophils Absolute: 0 10*3/uL (ref 0.0–0.1)
Basophils Relative: 0 %
Eosinophils Absolute: 0 10*3/uL (ref 0.0–0.5)
Eosinophils Relative: 0 %
HCT: 41.9 % (ref 39.0–52.0)
Hemoglobin: 13.8 g/dL (ref 13.0–17.0)
Immature Granulocytes: 0 %
Lymphocytes Relative: 38 %
Lymphs Abs: 1.2 10*3/uL (ref 0.7–4.0)
MCH: 30 pg (ref 26.0–34.0)
MCHC: 32.9 g/dL (ref 30.0–36.0)
MCV: 91.1 fL (ref 80.0–100.0)
Monocytes Absolute: 0.6 10*3/uL (ref 0.1–1.0)
Monocytes Relative: 18 %
Neutro Abs: 1.4 10*3/uL — ABNORMAL LOW (ref 1.7–7.7)
Neutrophils Relative %: 44 %
Platelets: 242 10*3/uL (ref 150–400)
RBC: 4.6 MIL/uL (ref 4.22–5.81)
RDW: 15.6 % — ABNORMAL HIGH (ref 11.5–15.5)
WBC: 3.3 10*3/uL — ABNORMAL LOW (ref 4.0–10.5)
nRBC: 0 % (ref 0.0–0.2)

## 2020-12-30 LAB — BASIC METABOLIC PANEL
Anion gap: 6 (ref 5–15)
BUN: 14 mg/dL (ref 6–20)
CO2: 30 mmol/L (ref 22–32)
Calcium: 8.6 mg/dL — ABNORMAL LOW (ref 8.9–10.3)
Chloride: 102 mmol/L (ref 98–111)
Creatinine, Ser: 1.35 mg/dL — ABNORMAL HIGH (ref 0.61–1.24)
GFR, Estimated: >60 mL/min (ref >60)
Glucose, Bld: 125 mg/dL — ABNORMAL HIGH (ref 70–99)
Potassium: 3.9 mmol/L (ref 3.5–5.1)
Sodium: 138 mmol/L (ref 135–145)

## 2020-12-30 LAB — MAGNESIUM: Magnesium: 1.5 mg/dL — ABNORMAL LOW (ref 1.7–2.4)

## 2020-12-30 NOTE — Progress Notes (Signed)
Oncology Nurse Navigator Documentation  I met with Justin Baldwin during his follow up appointment with Dr. Isidore Moos today. He is slowly improving though is concerned about mouth pain. He continues to eat softer foods orally and use nutritional supplements via his PEG. He will have labs drawn and see Dr. Chryl Heck as well today. He will see Dr. Isidore Moos again in June to receive results of a PET scan completed before the appointment. He knows to call me with any questions or concerns.   Harlow Asa RN, BSN, OCN Head & Neck Oncology Nurse [place] [facility] Phone # [phone]  Fax # [phone]

## 2020-12-30 NOTE — Progress Notes (Signed)
Mr. Moncus presents today for 2 week follow up after completing radiation to he left tonsil on 12/15/2020  Pain issues, if any: Mouth and throat pain have improved slightly, but still bothersome Using a feeding tube?: Yes. Reports he does ~4 feedings every day. States he struggles with nausea afterwards Weight changes, if any:  Wt Readings from Last 3 Encounters:  12/30/20 190 lb (86.2 kg)  12/19/20 197 lb 9.6 oz (89.6 kg)  12/17/20 198 lb 2 oz (89.9 kg)   Swallowing issues, if any: Yes--only able to tolerate liquids.  Smoking or chewing tobacco?None Using fluoride trays daily? N/A Last ENT visit was on: Not since diagnosis Other notable issues, if any: F/U with medical oncologist Dr. Chryl Heck later this afternoon. Continues to deal with thick saliva and dry mouth. Deals with intermittent left ear pain. Denies any difficulty opening his mouth fully. [place] issues have resolved. Continues to deal with constipation, plans to use an enema if it doesn't resolve. Struggling with falling asleep and staying asleep. Skin in treatment field appears intact with a few dry/scaley patches. Biggest concern is lingering mucositis  Vitals:   12/30/20 1423  BP: 116/70  Pulse: 63  Resp: 18  Temp: (!) 97 F (36.1 C)  SpO2: 99%

## 2020-12-30 NOTE — Telephone Encounter (Signed)
Scheduled per los. Declined printout  

## 2020-12-31 ENCOUNTER — Telehealth: Payer: Self-pay

## 2020-12-31 ENCOUNTER — Other Ambulatory Visit: Payer: Self-pay

## 2020-12-31 ENCOUNTER — Encounter: Payer: Self-pay | Admitting: Hematology and Oncology

## 2020-12-31 NOTE — Progress Notes (Signed)
[place] NOTE  Patient Care Team: Pleas [name], NP as PCP - General (Internal Medicine) Festus Aloe, MD as Consulting Physician (Urology) Melida Quitter, MD as Consulting Physician (Otolaryngology) Karie Mainland, RD as Dietitian (Nutrition)  CHIEF COMPLAINTS/PURPOSE OF CONSULTATION:  SCC tonsil  ASSESSMENT & PLAN:  No problem-specific Assessment & Plan notes found for this encounter.  No orders of the defined types were placed in this encounter.  This is a 45 yr old male patient with newly diagnosed T3( primary tumor according to TB discussion is borderline 4 cm), N1( multiple lymph nodes ipsilateral but PET pending) and M0/Stage II status post concurrent chemoradiation with cisplatin 100 mg per metered squared who is here for follow-up after completing chemotherapy. He has had some unexpected increase in blisters in his mouth and severe pain.  He was unable to eat and lost about 9 pounds since his last visit despite using the G-tube.  He is only able to use 4 cans of Osmolite a day.  Since he restarted the hydrocodone, he has been eating past and some soft foods.  He is hoping to gain weight and feeling better this week.  #2.  Chemotherapy/radiation induced mucositis Currently well controlled on hydrocodone every 6 hours as needed and Viscous Lidocaine.  Mucositis improving on my exam.  3.  Unintentional weight loss secondary to treatment and severe mucositis.  He has lost about 9 pounds since he last came for follow-up.  He was not able to eat much last week because of severe mucositis.  He has been only using 4 cans of Osmolite a day NG tube, he tells me that he cannot do anymore.  He is hoping to gain the weight back once he is able to eat some.   4. Constipation, intermittent, having bowel movement at least once a week.  No abdominal discomfort, nausea, vomiting suggestive of severe constipation or small bowel obstruction.  Encouraged him to  continue using the laxative daily   5. DM, type II, he needs to follow-up with his PCP for better management of type 2 diabetes.     6. Hypomagnesemia and some acute kidney injury.  Recommend IV fluids and magnesium replacement, this has been arranged at [facility].  Return to clinic in 2 weeks.  HISTORY OF PRESENTING ILLNESS:   Justin Baldwin 45 y.o. male is here because of new diagnosis of SCC tonsil/oropharynx.  HISTORY OF PRESENT ILLNESS:  Justin Baldwin is a 45 y.o. male who presented with ongoing head cold symptoms, sinus issues, left ear pain, and left-sided throat pain since September.  When the cold-like symptoms resolved, the throat and ear pain did not. Augmentin was prescribed without relief.  Subsequently, the patient saw Dr. Redmond Baseman, ENT at [facility], who noted that the left tonsil had a mass appearance on 09/19/2020. 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  Pertinent imaging thus far includes soft tissue neck CT scan on 09/30/2020 revealed a left tonsillar mass abutting the glossotonsillar sulcus and base of the tongue, consistent with known malignancy. Left level 2/3 cystic or necrotic adenopathy reflected nodal metastasis.  CT scan of chest performed on 09/30/2020 revealing a non-specific left lower lobe pulmonary micronodule but no acute intrathoracic abnormality and no definite intrathoracic metastasis.  I have personally reviewed his imaging and discussed him with Dr. Redmond Baseman.    He had a PET scan which showed hypermetabolic left tonsillar mass and bilateral lymphadenopathy consistent with  metastatic disease.  Hence recommendation was to proceed with concurrent chemoradiation.  He started radiation on 10/28/2020 chemotherapy delayed to 11/07/2020 because of COVID-19 infection.   Completed two cycles of cisplatin at a dose of 100 mg/m2.  Interval history  He is currently for follow-up after completion of chemotherapy and  radiation.  He continues to have severe pain in his mouth, blisters in his mouth concerning for mucositis.  He tells me that last week was his worst.  He could not eat much and lost about 9 pounds of weight.  He was using his G-tube and putting 4 cans of Osmolite in it. He denies any changes in breathing and urinary habits.  The urinary hesitancy has resolved.  No new neurological complaints. Rest of the pertinent 10 point ROS reviewed and negative.  MEDICAL HISTORY:  Past Medical History:  Diagnosis Date  . Diabetes mellitus without complication ([place])   . Erectile dysfunction   . Insomnia   . Skin abnormality    hidradenitis suppurativa     SURGICAL HISTORY: Past Surgical History:  Procedure Laterality Date  . IR GASTROSTOMY TUBE MOD SED  11/21/2020  . IR IMAGING GUIDED PORT INSERTION  11/21/2020  . IRRIGATION AND DEBRIDEMENT ABSCESS N/A 09/22/2018   Procedure: IRRIGATION AND DEBRIDEMENT ABSCESS;  Surgeon: Ralene Ok, MD;  Location: [facility];  Service: General;  Laterality: N/A;    SOCIAL HISTORY: Social History   Socioeconomic History  . Marital status: Married    Spouse name: Not on file  . Number of children: Not on file  . Years of education: Not on file  . Highest education level: Not on file  Occupational History  . Not on file  Tobacco Use  . Smoking status: Former Smoker    Packs/day: 1.00    Types: Cigarettes    Quit date: 10/27/2020    Years since quitting: 0.1  . Smokeless tobacco: Former Systems developer    Quit date: 02/14/2016  Vaping Use  . Vaping Use: Never used  Substance and Sexual Activity  . Alcohol use: Not Currently    Alcohol/week: 5.0 standard drinks    Types: 5 Shots of liquor per week    Comment: occasinal   . Drug use: No  . Sexual activity: Yes  Other Topics Concern  . Not on file  Social History Narrative   ** Merged History Encounter **       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    Social  Determinants of Health   Financial Resource Strain: Low Risk   . Difficulty of Paying Living Expenses: Not hard at all  Food Insecurity: No Food Insecurity  . Worried About Charity fundraiser in the Last Year: Never true  . Ran Out of Food in the Last Year: Never true  Transportation Needs: No Transportation Needs  . Lack of Transportation (Medical): No  . Lack of Transportation (Non-Medical): No  Physical Activity: Not on file  Stress: Not on file  Social Connections: Not on file  Intimate Partner Violence: Not on file    FAMILY HISTORY: Family History  Problem Relation Age of Onset  . Heart attack Father   . Emphysema Paternal Grandmother   . Arthritis Mother     ALLERGIES:  has No Known Allergies.  MEDICATIONS:  Current Outpatient Medications  Medication Sig Dispense Refill  . blood glucose meter kit and supplies KIT Dispense based on patient and insurance preference. Use up to four  times daily as directed. (FOR ICD-9 250.00, 250.01). 1 each 0  . fluconazole (DIFLUCAN) 100 MG tablet Take 2 tablets today, and 1 tablet daily for 6 more days. Hold Rosuvastatin while on this. 8 tablet 0  . glucose blood test strip Use three times daily as instructed 100 each 3  . HYDROcodone-acetaminophen (HYCET) 7.5-325 mg/15 ml solution Take 10 mLs by mouth every 6 (six) hours as needed for moderate pain or severe pain. 473 mL 0  . insulin glargine (LANTUS) 100 UNIT/ML injection Inject 0.1 mLs (10 Units total) into the skin daily. 10 mL 2  . lidocaine (XYLOCAINE) 2 % solution Patient: Mix 1part 2% viscous lidocaine, 1part H20. Swish & swallow 15mL of diluted mixture, 7min before meals and at bedtime, up to QID 200 mL 4  . metFORMIN (GLUCOPHAGE XR) 500 MG 24 hr tablet Take 1 tablet (500 mg total) by mouth daily with breakfast. For diabetes. 90 tablet 3  . Nutritional Supplements (FEEDING SUPPLEMENT, OSMOLITE 1.5 CAL,) LIQD 2 cartons Osmolite 1.5 via PEG twice daily with 120 mL free water before  and after.  1-1/2 cartons Osmolite 1.5 via PEG twice daily with 120 mL free water before and after.  Mix 30 mL Prosource no carb or equivalent with 30 mL water and flush with 210 mL water twice daily via PEG.  Provides 2605 cal, 134 g protein, 2707 mL free water/100% estimated needs.  0  . omeprazole (PRILOSEC) 20 MG capsule TAKE 1 CAPSULE BY MOUTH DAILY FOR HEARTBURN 90 capsule 3  . ondansetron (ZOFRAN) 8 MG tablet Take 1 tablet (8 mg total) by mouth 2 (two) times daily as needed. Start on the third day after cisplatin chemotherapy. 30 tablet 1  . oxymetazoline (AFRIN) 0.05 % nasal spray Place 1-2 sprays into both nostrils as needed for congestion.     . prochlorperazine (COMPAZINE) 10 MG tablet Take 1 tablet (10 mg total) by mouth every 6 (six) hours as needed (Nausea or vomiting). 30 tablet 1  . rosuvastatin (CRESTOR) 40 MG tablet Take 1 tablet (40 mg total) by mouth every evening. For cholesterol. 90 tablet 3  . scopolamine (TRANSDERM-SCOP) 1 MG/3DAYS Place 1 patch (1.5 mg total) onto the skin every 3 (three) days. 10 patch 1  . tadalafil (CIALIS) 5 MG tablet Take 1 tablet (5 mg total) by mouth daily as needed for erectile dysfunction. 90 tablet 0   No current facility-administered medications for this visit.     PHYSICAL EXAMINATION: ECOG PERFORMANCE STATUS: 0 - Asymptomatic  Vitals:   12/30/20 1524  BP: 115/64  Pulse: 67  Resp: 18  Temp: 97.7 F (36.5 C)  SpO2: 100%   There were no vitals filed for this visit.  GENERAL:alert, no distress and comfortable, continues to lose weight. SKIN: skin color, texture, turgor are normal, no rashes or significant lesions EYES: normal, conjunctiva are pink and non-injected, sclera clear Mucous membranes, mucositis appears to have improved on my exam compared to last visit but he continues to have some mucositis. NECK: supple, thyroid normal size, tender, skin changes from ongoing radiation. LYMPH: NO palpable LN LUNGS: clear to auscultation  and percussion with normal breathing effort HEART: regular rate & rhythm and no murmurs and no lower extremity edema ABDOMEN:abdomen soft, non-tender and normal bowel sounds, no guarding, rigidity.  G-tube in place and no concerns for infection. Musculoskeletal:no cyanosis of digits and no clubbing  PSYCH: alert & oriented x 3 with fluent speech NEURO: no focal motor/sensory deficits  LABORATORY DATA:  I have reviewed the data as listed Lab Results  Component Value Date   WBC 3.3 (L) 12/30/2020   HGB 13.8 12/30/2020   HCT 41.9 12/30/2020   MCV 91.1 12/30/2020   PLT 242 12/30/2020     Chemistry      Component Value Date/Time   NA 138 12/30/2020 1457   NA 135 09/12/2019 1048   K 3.9 12/30/2020 1457   CL 102 12/30/2020 1457   CO2 30 12/30/2020 1457   BUN 14 12/30/2020 1457   BUN 18 09/12/2019 1048   CREATININE 1.35 (H) 12/30/2020 1457   CREATININE 1.28 (H) 12/11/2020 0816   CREATININE 1.38 (H) 11/30/2018 1102      Component Value Date/Time   CALCIUM 8.6 (L) 12/30/2020 1457   ALKPHOS 43 10/15/2020 1132   AST 35 10/15/2020 1132   ALT 79 (H) 10/15/2020 1132   BILITOT 0.8 10/15/2020 1132     Labs concerning for some AKI and hypomagnesemia, will arrange for magnesium replacement.  RADIOGRAPHIC STUDIES: I have personally reviewed the radiological images as listed and agreed with the findings in the report. No results found. Final Pathologic Diagnosis    TONSIL, BIOPSY:              Squamous cell carcinoma, HPV associated.  Electronically signed by Garnette Czech, MD on 09/25/2020 at 3:50 PM    All questions were answered. The patient knows to call the clinic with any problems, questions or concerns. I spent 30 minutes in the care of this patient including history and physical, review of records, counseling and coordination, documentation.    Benay Pike, MD 12/31/2020 4:31 PM

## 2020-12-31 NOTE — Progress Notes (Signed)
Per Dr. Chryl Heck patient to receive one liter of normal saline and 2 grams of magnesium. Patient scheduled for 815 am on 01/01/21 at [facility]. Orders are under sign and held. Called patient to make him aware of appointment. No answer. Left voicemail with appointment day, time, and location and requested patient return call to [phone] and ask to speak to Dr. Rob Hickman nurse to confirm appointment.

## 2020-12-31 NOTE — Telephone Encounter (Signed)
Called patient multiple times to make him aware of the need for IV fluid and magnesium per Dr. Chryl Heck and that the appointment has been scheduled for tomorrow at 61 am at [facility]. Left several voicemails making patient aware of appointment. Also attempted to contact patients wife and received her voicemail. Requested that patient call the office to confirm when he gets the voicemail and as of 330 pm patient has not returned the phone call to the office. Dr. Chryl Heck made aware.

## 2021-01-01 ENCOUNTER — Inpatient Hospital Stay (HOSPITAL_COMMUNITY): Payer: BC Managed Care – PPO | Attending: Hematology

## 2021-01-02 NOTE — Progress Notes (Signed)
Radiation Oncology         [phone] ________________________________  Name: EDRICK WHITEHORN MRN: [id number]  Date: 12/30/2020  DOB: 11-Jan-1976  Follow-Up Visit Note  CC: Pleas [name], NP  Melida Quitter, MD  Diagnosis and Prior Radiotherapy:       ICD-10-CM   1. Squamous cell carcinoma of tonsil (HCC)  C09.9   2. Cancer of tonsillar fossa (HCC)  C09.0    Cancer Staging Cancer of tonsillar fossa ([place]) Staging form: Pharynx - HPV-Mediated Oropharynx, AJCC 8th Edition - Clinical stage from 10/01/2020: Stage II (cT3, cN1, cM0, p16+) - Signed by Eppie Gibson, MD on 10/01/2020 Stage prefix: Initial diagnosis - Pathologic stage from 10/14/2020: No Stage Recommended (cT3, cN2, cM0, p16+) - Signed by Benay Pike, MD on 10/14/2020 Stage prefix: Initial diagnosis   CHIEF COMPLAINT:  Here for follow-up and surveillance of tonsil cancer  Narrative:  The patient returns today for routine follow-up.   Mr. Gantt presents today for 2 week follow up after completing radiation to he left tonsil on 12/15/2020  Pain issues, if any: Mouth and throat pain have improved slightly, but still bothersome Using a feeding tube?: Yes. Reports he does ~4 feedings every day. States he struggles with nausea afterwards Weight changes, if any:  Wt Readings from Last 3 Encounters:  12/30/20 190 lb (86.2 kg)  12/19/20 197 lb 9.6 oz (89.6 kg)  12/17/20 198 lb 2 oz (89.9 kg)   Swallowing issues, if any: Yes--only able to tolerate liquids.  Smoking or chewing tobacco?None Using fluoride trays daily? N/A Last ENT visit was on: Not since diagnosis Other notable issues, if any: F/U with medical oncologist Dr. Chryl Heck later this afternoon. Continues to deal with thick saliva and dry mouth. Deals with intermittent left ear pain. Denies any difficulty opening his mouth fully. Urinary retention issues have resolved. Continues to deal with constipation, plans to use an enema if it doesn't resolve. Struggling with  falling asleep and staying asleep. Skin in treatment field appears intact with a few dry/scaley patches. Biggest concern is lingering mucositis   Not using scopolamine any longer, knows to hold that in case it contributed to the urinary retention - although those symptoms started soon after stopping this drug  Vitals:   12/30/20 1423  BP: 116/70  Pulse: 63  Resp: 18  Temp: (!) 97 F (36.1 C)  SpO2: 99%                       ALLERGIES:  has No Known Allergies.  Meds: Current Outpatient Medications  Medication Sig Dispense Refill  . blood glucose meter kit and supplies KIT Dispense based on patient and insurance preference. Use up to four times daily as directed. (FOR ICD-9 250.00, 250.01). 1 each 0  . fluconazole (DIFLUCAN) 100 MG tablet Take 2 tablets today, and 1 tablet daily for 6 more days. Hold Rosuvastatin while on this. 8 tablet 0  . glucose blood test strip Use three times daily as instructed 100 each 3  . HYDROcodone-acetaminophen (HYCET) 7.5-325 mg/15 ml solution Take 10 mLs by mouth every 6 (six) hours as needed for moderate pain or severe pain. 473 mL 0  . insulin glargine (LANTUS) 100 UNIT/ML injection Inject 0.1 mLs (10 Units total) into the skin daily. 10 mL 2  . lidocaine (XYLOCAINE) 2 % solution Patient: Mix 1part 2% viscous lidocaine, 1part H20. Swish & swallow 3m of diluted mixture, 366m before meals and at bedtime, up to  QID 200 mL 4  . metFORMIN (GLUCOPHAGE XR) 500 MG 24 hr tablet Take 1 tablet (500 mg total) by mouth daily with breakfast. For diabetes. 90 tablet 3  . Nutritional Supplements (FEEDING SUPPLEMENT, OSMOLITE 1.5 CAL,) LIQD 2 cartons Osmolite 1.5 via PEG twice daily with 120 mL free water before and after.  1-1/2 cartons Osmolite 1.5 via PEG twice daily with 120 mL free water before and after.  Mix 30 mL Prosource no carb or equivalent with 30 mL water and flush with 210 mL water twice daily via PEG.  Provides 2605 cal, 134 g protein, 2707 mL free  water/100% estimated needs.  0  . omeprazole (PRILOSEC) 20 MG capsule TAKE 1 CAPSULE BY MOUTH DAILY FOR HEARTBURN 90 capsule 3  . ondansetron (ZOFRAN) 8 MG tablet Take 1 tablet (8 mg total) by mouth 2 (two) times daily as needed. Start on the third day after cisplatin chemotherapy. 30 tablet 1  . oxymetazoline (AFRIN) 0.05 % nasal spray Place 1-2 sprays into both nostrils as needed for congestion.     . prochlorperazine (COMPAZINE) 10 MG tablet Take 1 tablet (10 mg total) by mouth every 6 (six) hours as needed (Nausea or vomiting). 30 tablet 1  . rosuvastatin (CRESTOR) 40 MG tablet Take 1 tablet (40 mg total) by mouth every evening. For cholesterol. 90 tablet 3  . scopolamine (TRANSDERM-SCOP) 1 MG/3DAYS Place 1 patch (1.5 mg total) onto the skin every 3 (three) days. 10 patch 1  . tadalafil (CIALIS) 5 MG tablet Take 1 tablet (5 mg total) by mouth daily as needed for erectile dysfunction. 90 tablet 0   No current facility-administered medications for this encounter.    Physical Findings: The patient is in no acute distress. Patient is alert and oriented. Wt Readings from Last 3 Encounters:  12/30/20 190 lb (86.2 kg)  12/19/20 197 lb 9.6 oz (89.6 kg)  12/17/20 198 lb 2 oz (89.9 kg)    height is 6' (1.829 m) and weight is 190 lb (86.2 kg). His temporal temperature is 97 F (36.1 C) (abnormal). His blood pressure is 116/70 and his pulse is 63. His respiration is 18 and oxygen saturation is 99%. .  General: Alert and oriented, in no acute distress HEENT: Head is normocephalic. Extraocular movements are intact. Oropharynx is notable for healing mucosa, improved mucositis, no obvious tumor Neck: no obvious adenopathy  Skin: Skin in treatment fields shows satisfactory healing over neck Psychiatric: Judgment and insight are intact. Affect is appropriate.   Lab Findings: Lab Results  Component Value Date   WBC 3.3 (L) 12/30/2020   HGB 13.8 12/30/2020   HCT 41.9 12/30/2020   MCV 91.1  12/30/2020   PLT 242 12/30/2020    Lab Results  Component Value Date   TSH 1.383 10/15/2020    Radiographic Findings: No results found.  Impression/Plan:    1) Head and Neck Cancer Status: healing from ChRT with clinical response  2) Nutritional Status:  Losing weight, will push as tolerated  Wt Readings from Last 3 Encounters:  12/30/20 190 lb (86.2 kg)  12/19/20 197 lb 9.6 oz (89.6 kg)  12/17/20 198 lb 2 oz (89.9 kg)   PEG tube: using for most of intake  3) Risk Factors: The patient has been educated about risk factors including alcohol and tobacco abuse; they understand that avoidance of alcohol and tobacco is important to prevent recurrences as well as other cancers  4) Swallowing: Continue SLP exercises  5) Dental: Encouraged to continue  regular followup with dentistry, and dental hygiene including fluoride rinses.   6) Thyroid function:  Check annually Lab Results  Component Value Date   TSH 1.383 10/15/2020    7) Other:constipation - try enema if OTC oral meds do not work.  Urinary retention - mild, resolved, will discuss w/ PCP if it recurs. May be BPH, but will hold scopolamine as a precaution.  8) Follow-up in with restaging PET in 3 months. The patient was encouraged to call with any issues or questions before then.  On date of service, in total, I spent 20 minutes on this encounter. Patient was seen in person. _____________________________________   Eppie Gibson, MD

## 2021-01-05 ENCOUNTER — Ambulatory Visit: Payer: BC Managed Care – PPO | Attending: Radiation Oncology

## 2021-01-05 ENCOUNTER — Other Ambulatory Visit: Payer: Self-pay

## 2021-01-05 DIAGNOSIS — C099 Malignant neoplasm of tonsil, unspecified: Secondary | ICD-10-CM

## 2021-01-05 DIAGNOSIS — R293 Abnormal posture: Secondary | ICD-10-CM

## 2021-01-05 DIAGNOSIS — R601 Generalized edema: Secondary | ICD-10-CM

## 2021-01-05 NOTE — Therapy (Addendum)
[place], [place], [address] Phone: [phone]   Fax:  [phone]  Physical Therapy Treatment  Patient Details  Name: Justin Baldwin MRN: [id number] Date of Birth: 1976/07/17 Referring Provider (PT): Dr. Isidore Moos   Encounter Date: 01/05/2021   PT End of Session - 01/05/21 1353    Visit Number 2    Number of Visits 6    Date for PT Re-Evaluation 02/02/21    PT Start Time 0803    PT Stop Time 0904    PT Time Calculation (min) 61 min    Activity Tolerance Patient tolerated treatment well    Behavior During Therapy [facility] for tasks assessed/performed           Past Medical History:  Diagnosis Date  . Diabetes mellitus without complication ([place])   . Erectile dysfunction   . Insomnia   . Skin abnormality    hidradenitis suppurativa     Past Surgical History:  Procedure Laterality Date  . IR GASTROSTOMY TUBE MOD SED  11/21/2020  . IR IMAGING GUIDED PORT INSERTION  11/21/2020  . IRRIGATION AND DEBRIDEMENT ABSCESS N/A 09/22/2018   Procedure: IRRIGATION AND DEBRIDEMENT ABSCESS;  Surgeon: Ralene Ok, MD;  Location: [facility];  Service: General;  Laterality: N/A;    There were no vitals filed for this visit.   Subjective Assessment - 01/05/21 0806    Subjective I started back to work a few weeks ago but last week but was my regular scheudle and I can tell I am weak. My core isn't as strong as it was and I'm noticing I'm using myscles I don't normally use with posturing and it's really bothering me. My eating has really picked up in the last 4 days or so and I'm trying to not use my feeding tube so I can get that out. they will remove it when I've gained enough weight back. I've started back to the gym but only can go for about 45 mins when I used to go almost 1.5 hours.    Pertinent History Pt had a cold in September and throat pain and ear pain did not resolve.  He had several rounds of antibiotics with no  improvement.  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 He had concurrent chemo and radiation.  He currently has a Peg and has lost 40 lbs since his treatment began.    Currently in Pain? No/denies                             [facility] Adult PT Treatment/Exercise - 01/05/21 0001      Self-Care   Self-Care Other Self-Care Comments    Other Self-Care Comments  Spent beginning of session discussing pts current functional status. he worked regular scheudle for first time last week and has some new tightness at bil scapulae. Went back to gym 3x last week and able to exercise for about 45 mins, used to do about 1.5 hours. Educated him on importance of postural strengthening exs when at the gym and encouraged him to begin daily walking routine and/or ride bike/walk on treadmill when at gym for increased endurance/stamina. He reports worried about weight loss but educated him that cardio for 10-15 mins or walking at hme will promote healing from chemo. Also discussed, briefly, his nurtition and he reports feeling like his appetite has  really returned in past 4 days and eating most by mouth. So also encouraged him that as his appetite conts to return he won't have to worry about losing more weight. Just be mindful to not overexert himself during this period of recovery. Pt able to verbalize good understanding of all.      Shoulder Exercises: Supine   Horizontal ABduction Strengthening;Both;10 reps    Theraband Level (Shoulder Horizontal ABduction) Level 3 (Green)    Horizontal ABduction Limitations Pt returned therapist demo for each supine ex    External Rotation Strengthening;Both;10 reps;Theraband    Theraband Level (Shoulder External Rotation) Level 3 (Green)    Flexion Strengthening;Both;10 reps;Theraband    Theraband Level (Shoulder Flexion) Level 3 (Green)    Diagonals  Strengthening;Right;Left;10 reps;Theraband    Theraband Level (Shoulder Diagonals) Level 3 (Green)      Manual Therapy   Manual Therapy Soft tissue mobilization;Scapular mobilization;Edema management    Edema Management Issued chip pack for possible mild fluid inferior to mandible. Encouraged pt to wear this 1-2 hours at night for about a weeek to see if any improvement noted.    Soft tissue mobilization In Lt S/L for STM with cocoa butter to Rt periscapular area and UT. Trigger point release done here and good loosening of tight tissue noted. Then same to other side with pt in Rt S/L.    Scapular Mobilization When in S/L protraction, retraction and depression to each sacpula during STM                  PT Education - 01/05/21 0823    Education Details Supine scapular series with green theraband    Person(s) Educated Patient    Methods Explanation;Demonstration;Handout    Comprehension Verbalized understanding;Returned demonstration               PT Long Term Goals - 12/22/20 1510      PT LONG TERM GOAL #1   Title Pt will be independent in a HEP for cervical ROM and will note decreased tightness and improved mobility    Time 4    Period Weeks    Status New    Target Date 01/19/21      PT LONG TERM GOAL #2   Title Pt will be independent in self neck MLD    Time 6    Period Weeks    Status New    Target Date 01/27/21      PT LONG TERM GOAL #3   Title Pt will have appropriate compression garments prn    Time 6    Period Weeks    Status New    Target Date 02/02/21                 Plan - 01/05/21 1353    Clinical Impression Statement Pt returns to physical therapy after about 2 weeks. He reports overall feeling better and last week, although still limited by fatigue, was a pretty normal work week and able to get back to the gym. Spent time educating him about gym exercises (see flowsheet) and importance of postural awareness at work and gym. Also progressed  HEP to include supine scapular exs which he was challenged by and educatd him about how to progress these in standing against the wall once they become easier. Then manual therapy to bil scapulae due to pt c/o tightness since returning back to work. Trigger point release noted and good softening of tissue noted by end of session. Also issued  chip pack for pt to wear 1-2 hours each evening when watching TV or sitting up in bed for very mild fluid inferior to mandible. this may also be in part due to pt loosing 50 lbs during cancer treatment, but he is willing to try chip pack to see if this will help with any possible fluid reduction. Pt will benefit from a few more PT sessions to help finalize a HEP for home or gym, cont postural awareness/strengthening and manual therapy prn.    Comorbidities Tonsilar cancer s/p chemo/radiation    Examination-Activity Limitations Sleep;Other   eating secondary ot mouth sores though improved   Stability/Clinical Decision Making Stable/Uncomplicated    Rehab Potential Good    PT Frequency 1x / week    PT Duration 6 weeks    PT Treatment/Interventions ADLs/Self Care Home Management;Therapeutic exercise;Manual techniques;Patient/family education;Manual lymph drainage;Passive range of motion;Vasopneumatic Device    PT Next Visit Plan Any improvement with chip pack? Began daily walking routine? Review HEP issued today and progress postural strength, educate in gym equipment on ortho side? ; cont manual therapy prn    PT Home Exercise Plan cervical ROM, proper posture, walking program; supine scapular series    Consulted and Agree with Plan of Care Patient           Patient will benefit from skilled therapeutic intervention in order to improve the following deficits and impairments:  Decreased mobility,Decreased strength,Increased edema,Postural dysfunction,Decreased skin integrity,Decreased knowledge of precautions,Decreased endurance,Pain  Visit Diagnosis: Malignant  neoplasm of tonsil (HCC)  Abnormal posture  Generalized edema     Problem List Patient Active Problem List   Diagnosis Date Noted  . Port-A-Cath in place 12/04/2020  . Cancer of tonsillar fossa ([place]) 10/01/2020  . Type 2 diabetes mellitus without complication, without long-term current use of insulin ([place]) 09/03/2019  . Hyperglycemia 09/03/2019  . Gastroesophageal reflux disease 03/06/2019  . Atherosclerosis of aorta ([place]) 11/30/2018  . Dizziness 11/30/2018  . Medication monitoring encounter 10/31/2018  . Atypical mycobacterium infection   . Abscess of buttock, left 09/22/2018  . Hydradenitis 09/22/2018  . Kidney insufficiency 09/22/2018  . Lump 08/31/2018  . Deep tissue injury 08/25/2018  . Skin rash 07/25/2018  . Fatigue 11/08/2017  . Vertigo 07/11/2017  . Preventative health care 03/29/2016  . Insomnia 03/15/2016  . Erectile dysfunction 03/15/2016   PHYSICAL THERAPY DISCHARGE SUMMARY  Visits from Start of Care: 2  Current functional level related to goals / functional outcomes: Goals not fully assessed.  Pt called to cancel remaining appts secondary to feeling great and has returned to gym activities   Remaining deficits: Unknown.   Education / Equipment: Exercise/chip pack  Plan: Patient agrees to discharge.  Patient goals were partially met. Patient is being discharged due to being pleased with the current functional level.  ?????      Otelia Limes, PTA 01/05/2021, 2:01 PM  [place], [place], [address] Phone: [phone]   Fax:  [phone]  Name: Justin Baldwin MRN: [id number] Date of Birth: 31-Oct-1975 Cheral Almas, PT 01/13/21 7:07 AM

## 2021-01-05 NOTE — Patient Instructions (Signed)
Over Head Pull: Narrow and Wide Grip   Cancer Rehab [phone]   On back, knees bent, feet flat, band across thighs, elbows straight but relaxed. Pull hands apart (start). Keeping elbows straight, bring arms up and over head, hands toward floor. Keep pull steady on band. Hold momentarily. Return slowly, keeping pull steady, back to start. Then do same with a wider grip on the band (past shoulder width) Repeat _5-10__ times. Band color __yellow____   Side Pull: Double Arm   On back, knees bent, feet flat. Arms perpendicular to body, shoulder level, elbows straight but relaxed. Pull arms out to sides, elbows straight. Resistance band comes across collarbones, hands toward floor. Hold momentarily. Slowly return to starting position. Repeat _5-10__ times. Band color _green____   Sword   On back, knees bent, feet flat, left hand on left hip, right hand above left. Pull right arm DIAGONALLY (hip to shoulder) across chest. Bring right arm along head toward floor. Hold momentarily. Slowly return to starting position. Repeat _5-10__ times. Do with left arm. Band color _green_____   Shoulder Rotation: Double Arm   On back, knees bent, feet flat, elbows tucked at sides, bent 90, hands palms up. Pull hands apart and down toward floor, keeping elbows near sides. Hold momentarily. Slowly return to starting position. Repeat _5-10__ times. Band color __green____

## 2021-01-06 DIAGNOSIS — F418 Other specified anxiety disorders: Secondary | ICD-10-CM | POA: Diagnosis not present

## 2021-01-07 ENCOUNTER — Other Ambulatory Visit: Payer: Self-pay | Admitting: Hematology and Oncology

## 2021-01-07 DIAGNOSIS — C090 Malignant neoplasm of tonsillar fossa: Secondary | ICD-10-CM

## 2021-01-10 ENCOUNTER — Other Ambulatory Visit: Payer: Self-pay | Admitting: Hematology and Oncology

## 2021-01-10 DIAGNOSIS — C090 Malignant neoplasm of tonsillar fossa: Secondary | ICD-10-CM

## 2021-01-12 ENCOUNTER — Other Ambulatory Visit: Payer: Self-pay

## 2021-01-12 ENCOUNTER — Telehealth: Payer: Self-pay

## 2021-01-12 ENCOUNTER — Ambulatory Visit: Payer: BC Managed Care – PPO

## 2021-01-12 ENCOUNTER — Telehealth: Payer: Self-pay | Admitting: *Deleted

## 2021-01-12 ENCOUNTER — Other Ambulatory Visit: Payer: Self-pay | Admitting: Hematology and Oncology

## 2021-01-12 DIAGNOSIS — K1231 Oral mucositis (ulcerative) due to antineoplastic therapy: Secondary | ICD-10-CM | POA: Insufficient documentation

## 2021-01-12 DIAGNOSIS — R634 Abnormal weight loss: Secondary | ICD-10-CM | POA: Insufficient documentation

## 2021-01-12 DIAGNOSIS — C090 Malignant neoplasm of tonsillar fossa: Secondary | ICD-10-CM

## 2021-01-12 MED ORDER — NYSTATIN 100000 UNIT/ML MT SUSP: OROMUCOSAL | 0 refills | Status: DC

## 2021-01-12 MED ORDER — HYDROCODONE-ACETAMINOPHEN 7.5-325 MG/15ML PO SOLN: 10.0000 mL | Freq: Four times a day (QID) | ORAL | 0 refills | Status: DC | PRN

## 2021-01-12 NOTE — Telephone Encounter (Signed)
Returned patient's call regarding pain management and prescription renewal. Patient informed of refilled prescription from Dr. Chryl Heck. Also, discussed following up for patient regarding referral to pain management clinic. Dr. Chryl Heck made aware.

## 2021-01-12 NOTE — Progress Notes (Signed)
Hydrocodone prescription refilled for one week.  Justin Baldwin

## 2021-01-12 NOTE — Telephone Encounter (Signed)
CALLED PATIENT TO INQUIRE IF HE STILLS NEED TO SEE A PAIN DR. DUE TO DR. Dionne Ano OFFICE NOT ACCEPTING HIS INSURANCE, SPOKE WITH PATIENT

## 2021-01-12 NOTE — Progress Notes (Signed)
[place] NOTE  Patient Care Team: Pleas [name], NP as PCP - General (Internal Medicine) Festus Aloe, MD as Consulting Physician (Urology) Melida Quitter, MD as Consulting Physician (Otolaryngology) Karie Mainland, RD as Dietitian (Nutrition)  CHIEF COMPLAINTS/PURPOSE OF CONSULTATION:  SCC tonsil  ASSESSMENT & PLAN:   Cancer of tonsillar fossa [name] has completed concurrent chemoradiation with Q 21 days cisplatin. He received 2 out of planned 3 doses given active COVID infection which delayed systemic therapy. He continues to recover, mucositis improving remarkably. PE, no palpable LN. Tonsil not inspected because of some ongoing mucositis. Overall remarkable response. PET planned in June. If complete response, then we can arrange for [facility] and PEG removal.  Mucositis due to antineoplastic therapy He is using hydrocodone PRN and viscous lidocaine. Overall he is not needing much hydrocodone. He is able to eat almost all foods now. He gained about 3 lbs on his scale. He is not using G tube much at this point.  Weight loss, unintentional Improving, able to tolerate oral feeding Again encouraged to FU with his PCP on insulin needs given significant weight loss  Drug induced constipation Likely from narcotic use. Improving, doesn't have to use Laxatives very much, using PRN  Type 2 diabetes mellitus without complication, without long-term current use of insulin ([place]) He was encouraged to see his PCP for insulin adjustment given the significant weight loss He has been checking his glucose measuring about 117 and below. He is not using insulin at this time.  No orders of the defined types were placed in this encounter.  HISTORY OF PRESENTING ILLNESS:   Justin Baldwin 45 y.o. male is here because of new diagnosis of SCC tonsil/oropharynx.  HISTORY OF PRESENT ILLNESS:  Justin Baldwin is a 45 y.o. male who presented with ongoing head  cold symptoms, sinus issues, left ear pain, and left-sided throat pain since September.  When the cold-like symptoms resolved, the throat and ear pain did not. Augmentin was prescribed without relief.  Subsequently, the patient saw Dr. Redmond Baseman, ENT at [facility], who noted that the left tonsil had a mass appearance on 09/19/2020. 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  Pertinent imaging thus far includes soft tissue neck CT scan on 09/30/2020 revealed a left tonsillar mass abutting the glossotonsillar sulcus and base of the tongue, consistent with known malignancy. Left level 2/3 cystic or necrotic adenopathy reflected nodal metastasis.  CT scan of chest performed on 09/30/2020 revealing a non-specific left lower lobe pulmonary micronodule but no acute intrathoracic abnormality and no definite intrathoracic metastasis.  I have personally reviewed his imaging and discussed him with Dr. Redmond Baseman.    He had a PET scan which showed hypermetabolic left tonsillar mass and bilateral lymphadenopathy consistent with metastatic disease.  Hence recommendation was to proceed with concurrent chemoradiation.  He started radiation on 10/28/2020 chemotherapy delayed to 11/07/2020 because of COVID-19 infection.   Completed two cycles of cisplatin at a dose of 100 mg/m2.  Interval history  He is doing much better. Mucositis improving He is able to eat most foods, although he cant taste most of them. At home, he has been monitoring his weight and he gained about 3 lbs.  He hasn't been using the G tube. He feels his left ear is bubbly but no hearing issues No neuropathy Urine mostly pale colored, he is hydrating well Constipation improving as well.  MEDICAL HISTORY:  Past Medical History:  Diagnosis Date   Diabetes mellitus without complication ([place])    Erectile dysfunction    Insomnia    Skin abnormality    hidradenitis suppurativa     SURGICAL  HISTORY: Past Surgical History:  Procedure Laterality Date   IR GASTROSTOMY TUBE MOD SED  11/21/2020   IR IMAGING GUIDED PORT INSERTION  11/21/2020   IRRIGATION AND DEBRIDEMENT ABSCESS N/A 09/22/2018   Procedure: IRRIGATION AND DEBRIDEMENT ABSCESS;  Surgeon: Ralene Ok, MD;  Location: [facility];  Service: General;  Laterality: N/A;    SOCIAL HISTORY: Social History   Socioeconomic History   Marital status: Married    Spouse name: Not on file   Number of children: Not on file   Years of education: Not on file   Highest education level: Not on file  Occupational History   Not on file  Tobacco Use   Smoking status: Former Smoker    Packs/day: 1.00    Types: Cigarettes    Quit date: 10/27/2020    Years since quitting: 0.2   Smokeless tobacco: Former Systems developer    Quit date: 02/14/2016  Vaping Use   Vaping Use: Never used  Substance and Sexual Activity   Alcohol use: Not Currently    Alcohol/week: 5.0 standard drinks    Types: 5 Shots of liquor per week    Comment: occasinal    Drug use: No   Sexual activity: Yes  Other Topics Concern   Not on file  Social History Narrative   ** Merged History Encounter **       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    Social Determinants of Health   Financial Resource Strain: Low Risk    Difficulty of Paying Living Expenses: Not hard at all  Food Insecurity: No Food Insecurity   Worried About Charity fundraiser in the Last Year: Never true   Causey in the Last Year: Never true  Transportation Needs: No Transportation Needs   Lack of Transportation (Medical): No   Lack of Transportation (Non-Medical): No  Physical Activity: Not on file  Stress: Not on file  Social Connections: Not on file  Intimate Partner Violence: Not on file    FAMILY HISTORY: Family History  Problem Relation Age of Onset   Heart attack Father    Emphysema Paternal Grandmother     Arthritis Mother     ALLERGIES:  has No Known Allergies.  MEDICATIONS:  Current Outpatient Medications  Medication Sig Dispense Refill   blood glucose meter kit and supplies KIT Dispense based on patient and insurance preference. Use up to four times daily as directed. (FOR ICD-9 250.00, 250.01). 1 each 0   fluconazole (DIFLUCAN) 100 MG tablet Take 2 tablets today, and 1 tablet daily for 6 more days. Hold Rosuvastatin while on this. 8 tablet 0   glucose blood test strip Use three times daily as instructed 100 each 3   HYDROcodone-acetaminophen (HYCET) 7.5-325 mg/15 ml solution Take 10 mLs by mouth every 6 (six) hours as needed for moderate pain or severe pain (for chemoradiation induced mucositis). 280 mL 0   insulin glargine (LANTUS) 100 UNIT/ML injection Inject 0.1 mLs (10 Units total) into the skin daily. 10 mL 2   lidocaine (XYLOCAINE) 2 % solution Patient: Mix 1part 2% viscous lidocaine, 1part H20. Swish & swallow 76m of diluted mixture, 31m before meals and at bedtime, up to QID 200 mL 4   magic mouthwash (nystatin,  lidocaine, diphenhydrAMINE) suspension SWISH, GARGLE, AND spit 5 ML BY MOUTH EVERY 4 TO 6 HOURS as needed for pain. Wait 30 minutes AFTER using TO eat OR drink 180 mL 0   metFORMIN (GLUCOPHAGE XR) 500 MG 24 hr tablet Take 1 tablet (500 mg total) by mouth daily with breakfast. For diabetes. 90 tablet 3   Nutritional Supplements (FEEDING SUPPLEMENT, OSMOLITE 1.5 CAL,) LIQD 2 cartons Osmolite 1.5 via PEG twice daily with 120 mL free water before and after.  1-1/2 cartons Osmolite 1.5 via PEG twice daily with 120 mL free water before and after.  Mix 30 mL Prosource no carb or equivalent with 30 mL water and flush with 210 mL water twice daily via PEG.  Provides 2605 cal, 134 g protein, 2707 mL free water/100% estimated needs.  0   omeprazole (PRILOSEC) 20 MG capsule TAKE 1 CAPSULE BY MOUTH DAILY FOR HEARTBURN 90 capsule 3   ondansetron (ZOFRAN) 8 MG tablet Take 1 tablet  (8 mg total) by mouth 2 (two) times daily as needed. Start on the third day after cisplatin chemotherapy. 30 tablet 1   oxymetazoline (AFRIN) 0.05 % nasal spray Place 1-2 sprays into both nostrils as needed for congestion.      prochlorperazine (COMPAZINE) 10 MG tablet Take 1 tablet (10 mg total) by mouth every 6 (six) hours as needed (Nausea or vomiting). 30 tablet 1   rosuvastatin (CRESTOR) 40 MG tablet Take 1 tablet (40 mg total) by mouth every evening. For cholesterol. 90 tablet 3   scopolamine (TRANSDERM-SCOP) 1 MG/3DAYS Place 1 patch (1.5 mg total) onto the skin every 3 (three) days. 10 patch 1   tadalafil (CIALIS) 5 MG tablet Take 1 tablet (5 mg total) by mouth daily as needed for erectile dysfunction. 90 tablet 0   No current facility-administered medications for this visit.     PHYSICAL EXAMINATION: ECOG PERFORMANCE STATUS: 0 - Asymptomatic  Vitals:   01/13/21 0926  BP: 115/68  Pulse: 65  Resp: 18  Temp: 97.8 F (36.6 C)  SpO2: 99%   Filed Weights   01/13/21 0926  Weight: 190 lb 1 oz (86.2 kg)    GENERAL:alert, no distress and comfortable, gaining weight SKIN: skin color, texture, turgor are normal, no rashes or significant lesions EYES: normal, conjunctiva are pink and non-injected, sclera clear Mucous membranes, mucositis significantly improved. NECK: supple, thyroid normal size, tender, skin changes from ongoing radiation. LYMPH: NO palpable LN LUNGS: clear to auscultation and percussion with normal breathing effort HEART: regular rate & rhythm and no murmurs and no lower extremity edema ABDOMEN:abdomen soft, non-tender and normal bowel sounds, no guarding, rigidity.  G-tube in place and no concerns for infection. Musculoskeletal:no cyanosis of digits and no clubbing  PSYCH: alert & oriented x 3 with fluent speech NEURO: no focal motor/sensory deficits  LABORATORY DATA:  I have reviewed the data as listed Lab Results  Component Value Date   WBC 4.4  01/13/2021   HGB 14.6 01/13/2021   HCT 43.8 01/13/2021   MCV 92.2 01/13/2021   PLT 177 01/13/2021     Chemistry      Component Value Date/Time   NA 138 12/30/2020 1457   NA 135 09/12/2019 1048   K 3.9 12/30/2020 1457   CL 102 12/30/2020 1457   CO2 30 12/30/2020 1457   BUN 14 12/30/2020 1457   BUN 18 09/12/2019 1048   CREATININE 1.35 (H) 12/30/2020 1457   CREATININE 1.28 (H) 12/11/2020 0816   CREATININE 1.38 (H) 11/30/2018  1102      Component Value Date/Time   CALCIUM 8.6 (L) 12/30/2020 1457   ALKPHOS 43 10/15/2020 1132   AST 35 10/15/2020 1132   ALT 79 (H) 10/15/2020 1132   BILITOT 0.8 10/15/2020 1132     Labs concerning for some AKI and hypomagnesemia, will arrange for magnesium replacement.  RADIOGRAPHIC STUDIES: I have personally reviewed the radiological images as listed and agreed with the findings in the report. No results found. Final Pathologic Diagnosis    TONSIL, BIOPSY:              Squamous cell carcinoma, HPV associated.  Electronically signed by Garnette Czech, MD on 09/25/2020 at 3:50 PM    All questions were answered. The patient knows to call the clinic with any problems, questions or concerns.  I spent 30 minutes in the care of this patient including history and physical, review of records, counseling and coordination, documentation.    Benay Pike, MD 01/13/2021 10:29 AM

## 2021-01-13 ENCOUNTER — Inpatient Hospital Stay: Payer: BC Managed Care – PPO

## 2021-01-13 ENCOUNTER — Other Ambulatory Visit: Payer: Self-pay | Admitting: Hematology and Oncology

## 2021-01-13 ENCOUNTER — Inpatient Hospital Stay (HOSPITAL_BASED_OUTPATIENT_CLINIC_OR_DEPARTMENT_OTHER): Payer: BC Managed Care – PPO | Admitting: Hematology and Oncology

## 2021-01-13 ENCOUNTER — Other Ambulatory Visit: Payer: Self-pay

## 2021-01-13 ENCOUNTER — Encounter: Payer: Self-pay | Admitting: Hematology and Oncology

## 2021-01-13 DIAGNOSIS — R634 Abnormal weight loss: Secondary | ICD-10-CM

## 2021-01-13 DIAGNOSIS — K5903 Drug induced constipation: Secondary | ICD-10-CM

## 2021-01-13 DIAGNOSIS — C090 Malignant neoplasm of tonsillar fossa: Secondary | ICD-10-CM

## 2021-01-13 DIAGNOSIS — Z87891 Personal history of nicotine dependence: Secondary | ICD-10-CM | POA: Diagnosis not present

## 2021-01-13 DIAGNOSIS — E119 Type 2 diabetes mellitus without complications: Secondary | ICD-10-CM | POA: Diagnosis not present

## 2021-01-13 DIAGNOSIS — C099 Malignant neoplasm of tonsil, unspecified: Secondary | ICD-10-CM | POA: Diagnosis not present

## 2021-01-13 DIAGNOSIS — K1231 Oral mucositis (ulcerative) due to antineoplastic therapy: Secondary | ICD-10-CM

## 2021-01-13 LAB — CBC WITH DIFFERENTIAL/PLATELET
Abs Immature Granulocytes: 0.02 10*3/uL (ref 0.00–0.07)
Basophils Absolute: 0 10*3/uL (ref 0.0–0.1)
Basophils Relative: 1 %
Eosinophils Absolute: 0 10*3/uL (ref 0.0–0.5)
Eosinophils Relative: 1 %
HCT: 43.8 % (ref 39.0–52.0)
Hemoglobin: 14.6 g/dL (ref 13.0–17.0)
Immature Granulocytes: 1 %
Lymphocytes Relative: 29 %
Lymphs Abs: 1.3 10*3/uL (ref 0.7–4.0)
MCH: 30.7 pg (ref 26.0–34.0)
MCHC: 33.3 g/dL (ref 30.0–36.0)
MCV: 92.2 fL (ref 80.0–100.0)
Monocytes Absolute: 0.5 10*3/uL (ref 0.1–1.0)
Monocytes Relative: 11 %
Neutro Abs: 2.5 10*3/uL (ref 1.7–7.7)
Neutrophils Relative %: 57 %
Platelets: 177 10*3/uL (ref 150–400)
RBC: 4.75 MIL/uL (ref 4.22–5.81)
RDW: 15.9 % — ABNORMAL HIGH (ref 11.5–15.5)
WBC: 4.4 10*3/uL (ref 4.0–10.5)
nRBC: 0 % (ref 0.0–0.2)

## 2021-01-13 LAB — BASIC METABOLIC PANEL
Anion gap: 10 (ref 5–15)
BUN: 15 mg/dL (ref 6–20)
CO2: 28 mmol/L (ref 22–32)
Calcium: 8.9 mg/dL (ref 8.9–10.3)
Chloride: 102 mmol/L (ref 98–111)
Creatinine, Ser: 1.22 mg/dL (ref 0.61–1.24)
GFR, Estimated: >60 mL/min (ref >60)
Glucose, Bld: 136 mg/dL — ABNORMAL HIGH (ref 70–99)
Potassium: 4.4 mmol/L (ref 3.5–5.1)
Sodium: 140 mmol/L (ref 135–145)

## 2021-01-13 LAB — MAGNESIUM: Magnesium: 1.5 mg/dL — ABNORMAL LOW (ref 1.7–2.4)

## 2021-01-13 MED ORDER — MAGNESIUM OXIDE 400 (241.3 MG) MG PO TABS: 400.0000 mg | ORAL_TABLET | Freq: Two times a day (BID) | ORAL | 0 refills | Status: DC

## 2021-01-13 NOTE — Assessment & Plan Note (Signed)
Patient has completed concurrent chemoradiation with Q 21 days cisplatin. He received 2 out of planned 3 doses given active COVID infection which delayed systemic therapy. He continues to recover, mucositis improving remarkably. PE, no palpable LN. Tonsil not inspected because of some ongoing mucositis. Overall remarkable response. PET planned in June. If complete response, then we can arrange for [facility] and PEG removal.

## 2021-01-13 NOTE — Assessment & Plan Note (Signed)
Likely from narcotic use. Improving, doesn't have to use Laxatives very much, using PRN

## 2021-01-13 NOTE — Assessment & Plan Note (Signed)
He is using hydrocodone PRN and viscous lidocaine. Overall he is not needing much hydrocodone. He is able to eat almost all foods now. He gained about 3 lbs on his scale. He is not using G tube much at this point.

## 2021-01-13 NOTE — Assessment & Plan Note (Signed)
Improving, able to tolerate oral feeding Again encouraged to FU with his PCP on insulin needs given significant weight loss

## 2021-01-13 NOTE — Assessment & Plan Note (Signed)
He was encouraged to see his PCP for insulin adjustment given the significant weight loss He has been checking his glucose measuring about 117 and below. He is not using insulin at this time.

## 2021-01-14 ENCOUNTER — Other Ambulatory Visit: Payer: Self-pay

## 2021-01-14 DIAGNOSIS — C099 Malignant neoplasm of tonsil, unspecified: Secondary | ICD-10-CM

## 2021-01-15 ENCOUNTER — Telehealth: Payer: Self-pay | Admitting: Nutrition

## 2021-01-15 NOTE — Telephone Encounter (Signed)
Contacted patient by telephone for nutrition follow-up.  Patient completed radiation therapy on February 21 for tonsil cancer.  Last weight documented was 190 pounds March 22.  Patient reports he is eating well by mouth but cannot taste anything.  Mucositis has improved.  Blood sugars are controlled.  Patient is using his feeding tube mostly for additional water flushes.  He has not been drinking oral nutrition supplements.  Patient wants his feeding tube out before he goes to the beach in July.  Noted follow-up PET scan in June with 27-month follow-up scheduled directly after.  Encouraged patient to increase intake of both calories and water by mouth so that he no longer needs feeding tube to support nutritional needs.  Suggested he may want to try a high-calorie high-protein shake.  Reviewed goals of weight maintenance for 2 to 4 weeks without tube feeding in order to have feeding tube removed.  Patient is confident he can achieve this.  Patient has contact information for questions or concerns.

## 2021-01-19 ENCOUNTER — Ambulatory Visit: Payer: BC Managed Care – PPO

## 2021-01-20 ENCOUNTER — Other Ambulatory Visit: Payer: Self-pay | Admitting: Hematology and Oncology

## 2021-01-20 MED ORDER — HYDROCODONE-ACETAMINOPHEN 7.5-325 MG/15ML PO SOLN: 10.0000 mL | Freq: Four times a day (QID) | ORAL | 0 refills | Status: AC | PRN

## 2021-01-20 NOTE — Progress Notes (Signed)
I called the patient,  He is now using pain medication twice a day, I told him that the current prescription should last him about couple weeks. He says he may not need any additional pain medication after that He is eating well, gained almost 8 lbs. [facility] and he was informed  Letoya Stallone

## 2021-02-05 ENCOUNTER — Telehealth: Payer: Self-pay | Admitting: Hematology and Oncology

## 2021-02-05 DIAGNOSIS — F418 Other specified anxiety disorders: Secondary | ICD-10-CM | POA: Diagnosis not present

## 2021-02-05 NOTE — Telephone Encounter (Signed)
Scheduled per 04/14 scheduled message, patient has been called and notified.

## 2021-02-06 ENCOUNTER — Inpatient Hospital Stay (HOSPITAL_BASED_OUTPATIENT_CLINIC_OR_DEPARTMENT_OTHER): Payer: BC Managed Care – PPO | Admitting: Hematology and Oncology

## 2021-02-06 ENCOUNTER — Inpatient Hospital Stay: Payer: BC Managed Care – PPO | Attending: Hematology and Oncology

## 2021-02-06 ENCOUNTER — Other Ambulatory Visit: Payer: Self-pay

## 2021-02-06 ENCOUNTER — Encounter: Payer: Self-pay | Admitting: Hematology and Oncology

## 2021-02-06 ENCOUNTER — Telehealth: Payer: Self-pay | Admitting: Hematology and Oncology

## 2021-02-06 DIAGNOSIS — Z87891 Personal history of nicotine dependence: Secondary | ICD-10-CM | POA: Insufficient documentation

## 2021-02-06 DIAGNOSIS — I890 Lymphedema, not elsewhere classified: Secondary | ICD-10-CM | POA: Insufficient documentation

## 2021-02-06 DIAGNOSIS — C090 Malignant neoplasm of tonsillar fossa: Secondary | ICD-10-CM

## 2021-02-06 DIAGNOSIS — Z8616 Personal history of COVID-19: Secondary | ICD-10-CM | POA: Insufficient documentation

## 2021-02-06 DIAGNOSIS — Z923 Personal history of irradiation: Secondary | ICD-10-CM | POA: Diagnosis not present

## 2021-02-06 DIAGNOSIS — C099 Malignant neoplasm of tonsil, unspecified: Secondary | ICD-10-CM | POA: Diagnosis not present

## 2021-02-06 DIAGNOSIS — K1231 Oral mucositis (ulcerative) due to antineoplastic therapy: Secondary | ICD-10-CM | POA: Diagnosis not present

## 2021-02-06 DIAGNOSIS — R634 Abnormal weight loss: Secondary | ICD-10-CM | POA: Diagnosis not present

## 2021-02-06 LAB — BASIC METABOLIC PANEL
Anion gap: 11 (ref 5–15)
BUN: 14 mg/dL (ref 6–20)
CO2: 30 mmol/L (ref 22–32)
Calcium: 8.5 mg/dL — ABNORMAL LOW (ref 8.9–10.3)
Chloride: 99 mmol/L (ref 98–111)
Creatinine, Ser: 1.29 mg/dL — ABNORMAL HIGH (ref 0.61–1.24)
GFR, Estimated: >60 mL/min (ref >60)
Glucose, Bld: 122 mg/dL — ABNORMAL HIGH (ref 70–99)
Potassium: 3.9 mmol/L (ref 3.5–5.1)
Sodium: 140 mmol/L (ref 135–145)

## 2021-02-06 LAB — CBC WITH DIFFERENTIAL/PLATELET
Abs Immature Granulocytes: 0.01 10*3/uL (ref 0.00–0.07)
Basophils Absolute: 0 10*3/uL (ref 0.0–0.1)
Basophils Relative: 1 %
Eosinophils Absolute: 0 10*3/uL (ref 0.0–0.5)
Eosinophils Relative: 1 %
HCT: 46.9 % (ref 39.0–52.0)
Hemoglobin: 15.5 g/dL (ref 13.0–17.0)
Immature Granulocytes: 0 %
Lymphocytes Relative: 36 %
Lymphs Abs: 1.8 10*3/uL (ref 0.7–4.0)
MCH: 30.7 pg (ref 26.0–34.0)
MCHC: 33 g/dL (ref 30.0–36.0)
MCV: 92.9 fL (ref 80.0–100.0)
Monocytes Absolute: 0.4 10*3/uL (ref 0.1–1.0)
Monocytes Relative: 9 %
Neutro Abs: 2.6 10*3/uL (ref 1.7–7.7)
Neutrophils Relative %: 53 %
Platelets: 219 10*3/uL (ref 150–400)
RBC: 5.05 MIL/uL (ref 4.22–5.81)
RDW: 14.4 % (ref 11.5–15.5)
WBC: 4.8 10*3/uL (ref 4.0–10.5)
nRBC: 0 % (ref 0.0–0.2)

## 2021-02-06 LAB — MAGNESIUM: Magnesium: 1.8 mg/dL (ref 1.7–2.4)

## 2021-02-06 MED ORDER — METHYLPREDNISOLONE 4 MG PO TBPK: ORAL_TABLET | ORAL | 0 refills | Status: DC

## 2021-02-06 MED ORDER — BIOTENE DRY MOUTH MT LIQD: 15.0000 mL | OROMUCOSAL | 1 refills | Status: DC | PRN

## 2021-02-06 NOTE — Assessment & Plan Note (Signed)
Patient has completed concurrent chemoradiation with Q 21 days cisplatin. He received 2 out of planned 3 doses given active COVID infection which delayed systemic therapy. He continues to recover, mucositis improving remarkably. He stopped using pain medication 3 days ago. PE, no palpable LN. Lymphedema of neck, mild mucositis, G tube site clean. Overall remarkable response. PET planned in June. If complete response, then we can arrange for [facility] removal He was hoping to get the G tube removed sooner, if weight continues to improve and over 200 lbs we will arrange for it sooner.

## 2021-02-06 NOTE — Telephone Encounter (Signed)
Scheduled follow-up appointment per 4/15 los. Patient is aware.

## 2021-02-06 NOTE — Progress Notes (Signed)
[facility] [place] NOTE  Patient Care Team: Pleas [name], NP as PCP - General (Internal Medicine) Festus Aloe, MD as Consulting Physician (Urology) Melida Quitter, MD as Consulting Physician (Otolaryngology) Karie Mainland, RD as Dietitian (Nutrition) Eppie Gibson, MD as Consulting Physician (Radiation Oncology) Malmfelt, Stephani Police, RN as Oncology Nurse Navigator Benay Pike, MD as Consulting Physician (Hematology and Oncology)  CHIEF COMPLAINTS/PURPOSE OF CONSULTATION:   SCC tonsil  ASSESSMENT & PLAN:   Cancer of tonsillar fossa Justin Baldwin) Patient has completed concurrent chemoradiation with Q 21 days cisplatin. He received 2 out of planned 3 doses given active COVID infection which delayed systemic therapy. He continues to recover, mucositis improving remarkably. He stopped using pain medication 3 days ago. PE, no palpable LN. Lymphedema of neck, mild mucositis, G tube site clean. Overall remarkable response. PET planned in June. If complete response, then we can arrange for [facility] removal He was hoping to get the G tube removed sooner, if weight continues to improve and over 200 lbs we will arrange for it sooner.  Weight loss, unintentional Significantly improved Gained 12 lbs since last visit. He is eating and not using the G tube except for flushing. Will continue to monitor.  Mucositis due to antineoplastic therapy He completed last prescription of hydrocodone 2/3 days ago. He has some pain, but pain doesn't bother him as much as the swelling Continue viscous lidocaine and biotin rinse.   Lymphedema Encouraged to continue exercises as prescribed. Will try medrol dose pack for swelling inside the mouth, may cause temporary improvement. Engaged our NN who will see if there is additional treatment PT can offer.  No orders of the defined types were placed in this encounter.  HISTORY OF PRESENTING ILLNESS:   Justin Baldwin 45 y.o. male is here  because of new diagnosis of SCC tonsil/oropharynx.  HISTORY OF PRESENT ILLNESS:  Justin Baldwin is a 46 y.o. male who presented with ongoing head cold symptoms, sinus issues, left ear pain, and left-sided throat pain since September.  When the cold-like symptoms resolved, the throat and ear pain did not. Augmentin was prescribed without relief.  Subsequently, the patient saw Dr. Redmond Baseman, ENT at [facility], who noted that the left tonsil had a mass appearance on 09/19/2020. 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  Pertinent imaging thus far includes soft tissue neck CT scan on 09/30/2020 revealed a left tonsillar mass abutting the glossotonsillar sulcus and base of the tongue, consistent with known malignancy. Left level 2/3 cystic or necrotic adenopathy reflected nodal metastasis.  CT scan of chest performed on 09/30/2020 revealing a non-specific left lower lobe pulmonary micronodule but no acute intrathoracic abnormality and no definite intrathoracic metastasis.  I have personally reviewed his imaging and discussed him with Dr. Redmond Baseman.    He had a PET scan which showed hypermetabolic left tonsillar mass and bilateral lymphadenopathy consistent with metastatic disease.  Hence recommendation was to proceed with concurrent chemoradiation.  He started radiation on 10/28/2020 chemotherapy delayed to 11/07/2020 because of COVID-19 infection.   Completed two cycles of cisplatin at a dose of 100 mg/m2.  Interval history  He is doing much better. He is here for some swelling of neck He describes it as when he tries to eat, his roof touches his mouth. He also feels some swelling in neck that protrudes every time he eats. He finally tapered off his pain meds, didn't take it for 3/4 days now. He  felt lethargic but today is a good day He is able to eat most foods, weight at home is 193 lbs, gained 12 lbs. He would like to get the G tube out if possible, since he has  a planned beach vacation. He hasn't been using the G tube. He feels that his ears are clogged but no hearing loss. No neuropathy Urine mostly pale colored, he is hydrating well Constipation improving as well.  MEDICAL HISTORY:  Past Medical History:  Diagnosis Date  . Diabetes mellitus without complication ([place])   . Erectile dysfunction   . Insomnia   . Skin abnormality    hidradenitis suppurativa     SURGICAL HISTORY: Past Surgical History:  Procedure Laterality Date  . IR GASTROSTOMY TUBE MOD SED  11/21/2020  . IR IMAGING GUIDED PORT INSERTION  11/21/2020  . IRRIGATION AND DEBRIDEMENT ABSCESS N/A 09/22/2018   Procedure: IRRIGATION AND DEBRIDEMENT ABSCESS;  Surgeon: Ralene Ok, MD;  Location: [facility];  Service: General;  Laterality: N/A;    SOCIAL HISTORY: Social History   Socioeconomic History  . Marital status: Married    Spouse name: Not on file  . Number of children: Not on file  . Years of education: Not on file  . Highest education level: Not on file  Occupational History  . Not on file  Tobacco Use  . Smoking status: Former Smoker    Packs/day: 1.00    Types: Cigarettes    Quit date: 10/27/2020    Years since quitting: 0.2  . Smokeless tobacco: Former Systems developer    Quit date: 02/14/2016  Vaping Use  . Vaping Use: Never used  Substance and Sexual Activity  . Alcohol use: Not Currently    Alcohol/week: 5.0 standard drinks    Types: 5 Shots of liquor per week    Comment: occasinal   . Drug use: No  . Sexual activity: Yes  Other Topics Concern  . Not on file  Social History Narrative   ** Merged History Encounter **       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    Social Determinants of Health   Financial Resource Strain: Low Risk   . Difficulty of Paying Living Expenses: Not hard at all  Food Insecurity: No Food Insecurity  . Worried About Charity fundraiser in the Last Year: Never true  . Ran Out of Food in  the Last Year: Never true  Transportation Needs: No Transportation Needs  . Lack of Transportation (Medical): No  . Lack of Transportation (Non-Medical): No  Physical Activity: Not on file  Stress: Not on file  Social Connections: Not on file  Intimate Partner Violence: Not on file    FAMILY HISTORY: Family History  Problem Relation Age of Onset  . Heart attack Father   . Emphysema Paternal Grandmother   . Arthritis Mother     ALLERGIES:  has No Known Allergies.  MEDICATIONS:  Current Outpatient Medications  Medication Sig Dispense Refill  . antiseptic oral rinse (BIOTENE) LIQD 15 mLs by Mouth Rinse route as needed for dry mouth or mouth pain. Use 15 ml every 8-12 hrs, Rinse and spit. 15 mL 1  . methylPREDNISolone (MEDROL DOSEPAK) 4 MG TBPK tablet Take as instructed 21 each 0  . blood glucose meter kit and supplies KIT Dispense based on patient and insurance preference. Use up to four times daily as directed. (FOR ICD-9 250.00, 250.01). 1 each 0  . fluconazole (DIFLUCAN)  100 MG tablet Take 2 tablets today, and 1 tablet daily for 6 more days. Hold Rosuvastatin while on this. 8 tablet 0  . glucose blood test strip Use three times daily as instructed 100 each 3  . insulin glargine (LANTUS) 100 UNIT/ML injection Inject 0.1 mLs (10 Units total) into the skin daily. 10 mL 2  . lidocaine (XYLOCAINE) 2 % solution Patient: Mix 1part 2% viscous lidocaine, 1part H20. Swish & swallow 59mL of diluted mixture, 87min before meals and at bedtime, up to QID 200 mL 4  . magic mouthwash (nystatin, lidocaine, diphenhydrAMINE) suspension SWISH, GARGLE, AND spit 5 ML BY MOUTH EVERY 4 TO 6 HOURS as needed for pain. Wait 30 minutes AFTER using TO eat OR drink 180 mL 0  . magnesium oxide (MAG-OX) 400 (241.3 Mg) MG tablet Take 1 tablet (400 mg total) by mouth 2 (two) times daily. 20 tablet 0  . metFORMIN (GLUCOPHAGE XR) 500 MG 24 hr tablet Take 1 tablet (500 mg total) by mouth daily with breakfast. For  diabetes. 90 tablet 3  . Nutritional Supplements (FEEDING SUPPLEMENT, OSMOLITE 1.5 CAL,) LIQD 2 cartons Osmolite 1.5 via PEG twice daily with 120 mL free water before and after.  1-1/2 cartons Osmolite 1.5 via PEG twice daily with 120 mL free water before and after.  Mix 30 mL Prosource no carb or equivalent with 30 mL water and flush with 210 mL water twice daily via PEG.  Provides 2605 cal, 134 g protein, 2707 mL free water/100% estimated needs.  0  . omeprazole (PRILOSEC) 20 MG capsule TAKE 1 CAPSULE BY MOUTH DAILY FOR HEARTBURN 90 capsule 3  . ondansetron (ZOFRAN) 8 MG tablet Take 1 tablet (8 mg total) by mouth 2 (two) times daily as needed. Start on the third day after cisplatin chemotherapy. 30 tablet 1  . oxymetazoline (AFRIN) 0.05 % nasal spray Place 1-2 sprays into both nostrils as needed for congestion.     . prochlorperazine (COMPAZINE) 10 MG tablet Take 1 tablet (10 mg total) by mouth every 6 (six) hours as needed (Nausea or vomiting). 30 tablet 1  . rosuvastatin (CRESTOR) 40 MG tablet Take 1 tablet (40 mg total) by mouth every evening. For cholesterol. 90 tablet 3  . scopolamine (TRANSDERM-SCOP) 1 MG/3DAYS Place 1 patch (1.5 mg total) onto the skin every 3 (three) days. 10 patch 1  . tadalafil (CIALIS) 5 MG tablet Take 1 tablet (5 mg total) by mouth daily as needed for erectile dysfunction. 90 tablet 0   No current facility-administered medications for this visit.     PHYSICAL EXAMINATION: ECOG PERFORMANCE STATUS: 0 - Asymptomatic  Vitals:   02/06/21 0914  BP: 127/73  Pulse: 77  Resp: 19  Temp: 97.7 F (36.5 C)  SpO2: 100%   Filed Weights   02/06/21 0914  Weight: 195 lb 8 oz (88.7 kg)    GENERAL:alert, no distress and comfortable, gaining weight SKIN: skin color, texture, turgor are normal, no rashes or significant lesions EYES: normal, conjunctiva are pink and non-injected, sclera clear Mucous membranes, mucositis significantly improved. NECK: supple, thyroid normal  size, tender, skin changes from ongoing radiation. Neck erythema and swelling noted likely lymphadema. LYMPH: NO palpable LN LUNGS: clear to auscultation and percussion with normal breathing effort HEART: regular rate & rhythm and no murmurs and no lower extremity edema ABDOMEN:abdomen soft, non-tender and normal bowel sounds, no guarding, rigidity.  G-tube in place and no concerns for infection. Musculoskeletal:no cyanosis of digits and no clubbing  PSYCH:  alert & oriented x 3 with fluent speech NEURO: no focal motor/sensory deficits  LABORATORY DATA:  I have reviewed the data as listed Lab Results  Component Value Date   WBC 4.8 02/06/2021   HGB 15.5 02/06/2021   HCT 46.9 02/06/2021   MCV 92.9 02/06/2021   PLT 219 02/06/2021     Chemistry      Component Value Date/Time   NA 140 02/06/2021 0857   NA 135 09/12/2019 1048   K 3.9 02/06/2021 0857   CL 99 02/06/2021 0857   CO2 30 02/06/2021 0857   BUN 14 02/06/2021 0857   BUN 18 09/12/2019 1048   CREATININE 1.29 (H) 02/06/2021 0857   CREATININE 1.28 (H) 12/11/2020 0816   CREATININE 1.38 (H) 11/30/2018 1102      Component Value Date/Time   CALCIUM 8.5 (L) 02/06/2021 0857   ALKPHOS 43 10/15/2020 1132   AST 35 10/15/2020 1132   ALT 79 (H) 10/15/2020 1132   BILITOT 0.8 10/15/2020 1132     Labs concerning for some AKI and hypomagnesemia, will arrange for magnesium replacement.  RADIOGRAPHIC STUDIES: I have personally reviewed the radiological images as listed and agreed with the findings in the report. No results found. Final Pathologic Diagnosis    TONSIL, BIOPSY:              Squamous cell carcinoma, HPV associated.  Electronically signed by Garnette Czech, MD on 09/25/2020 at 3:50 PM    All questions were answered. The patient knows to call the clinic with any problems, questions or concerns.  I spent 30 minutes in the care of this patient including history and physical, review of records, counseling and  coordination, documentation.    Benay Pike, MD 02/06/2021 11:01 AM

## 2021-02-06 NOTE — Assessment & Plan Note (Signed)
He completed last prescription of hydrocodone 2/3 days ago. He has some pain, but pain doesn't bother him as much as the swelling Continue viscous lidocaine and biotin rinse.

## 2021-02-06 NOTE — Assessment & Plan Note (Signed)
Significantly improved Gained 12 lbs since last visit. He is eating and not using the G tube except for flushing. Will continue to monitor.

## 2021-02-06 NOTE — Assessment & Plan Note (Signed)
Encouraged to continue exercises as prescribed. Will try medrol dose pack for swelling inside the mouth, may cause temporary improvement. Engaged our NN who will see if there is additional treatment PT can offer.

## 2021-02-10 ENCOUNTER — Other Ambulatory Visit: Payer: Self-pay

## 2021-02-10 DIAGNOSIS — C090 Malignant neoplasm of tonsillar fossa: Secondary | ICD-10-CM

## 2021-02-10 DIAGNOSIS — F418 Other specified anxiety disorders: Secondary | ICD-10-CM | POA: Diagnosis not present

## 2021-02-10 NOTE — Progress Notes (Signed)
am

## 2021-02-11 NOTE — Progress Notes (Signed)
  Patient Name: Justin Baldwin MRN: [id number] DOB: [birth year] Referring Physician: Redmond Baseman DWIGHT (Profile Not Attached) Date of Service: 12/15/2020 [facility] [place]                                                        End Of Treatment Note  Diagnoses: C09.0-Malignant neoplasm of tonsillar fossa  Cancer Staging: Cancer Staging Cancer of tonsillar fossa ([place]) Staging form: Pharynx - HPV-Mediated Oropharynx, AJCC 8th Edition - Clinical stage from 10/01/2020: Stage II (cT3, cN2, cM0, p16+) - Signed by Eppie Gibson, MD on 02/11/2021 Stage prefix: Initial diagnosis  Intent: Curative  Radiation Treatment Dates: 10/28/2020 through 12/15/2020 Site Technique Total Dose (Gy) Dose per Fx (Gy) Completed Fx Beam Energies  Tonsil, Left: [id number]_tonsil IMRT 70/70 2 35/35 6X   Narrative: The patient tolerated radiation therapy relatively well. He did have COVID during the start of his treatment course, leading to a delay in chemotherapy.  Plan: The patient will follow-up with radiation oncology in 2-3wks. -----------------------------------  Eppie Gibson, MD

## 2021-02-12 ENCOUNTER — Other Ambulatory Visit: Payer: Self-pay | Admitting: Hematology and Oncology

## 2021-02-12 DIAGNOSIS — C090 Malignant neoplasm of tonsillar fossa: Secondary | ICD-10-CM

## 2021-02-14 DIAGNOSIS — L03213 Periorbital cellulitis: Secondary | ICD-10-CM | POA: Diagnosis not present

## 2021-02-16 ENCOUNTER — Inpatient Hospital Stay: Payer: BC Managed Care – PPO

## 2021-02-16 ENCOUNTER — Inpatient Hospital Stay: Payer: BC Managed Care – PPO | Admitting: Hematology and Oncology

## 2021-02-18 ENCOUNTER — Other Ambulatory Visit: Payer: Self-pay

## 2021-02-18 DIAGNOSIS — C090 Malignant neoplasm of tonsillar fossa: Secondary | ICD-10-CM

## 2021-02-19 ENCOUNTER — Ambulatory Visit: Payer: BC Managed Care – PPO | Attending: Radiation Oncology | Admitting: Rehabilitation

## 2021-02-19 ENCOUNTER — Other Ambulatory Visit: Payer: Self-pay

## 2021-02-19 ENCOUNTER — Encounter: Payer: Self-pay | Admitting: Rehabilitation

## 2021-02-19 DIAGNOSIS — R601 Generalized edema: Secondary | ICD-10-CM

## 2021-02-19 DIAGNOSIS — C099 Malignant neoplasm of tonsil, unspecified: Secondary | ICD-10-CM

## 2021-02-19 DIAGNOSIS — R293 Abnormal posture: Secondary | ICD-10-CM | POA: Insufficient documentation

## 2021-02-19 DIAGNOSIS — F418 Other specified anxiety disorders: Secondary | ICD-10-CM | POA: Diagnosis not present

## 2021-02-19 NOTE — Patient Instructions (Signed)
Manual lymph drainage for the neck, anterior approach  Sit in front of a mirror. Do 5 slow deep breaths, breathing in through the nose and out through the mouth, letting your belly "inflate" as you breathe in.  Rest your hands on your abdomen as you do this to give slight pressure there.  1) Place hands on areas just behind collar bones and do 10 stationary circles with stretch in outward directions. 2) Do stationary circles at each armpit about 10 times. 3) Place one hand on the front of the opposite shoulder and do stationary circles with stretch downward toward underarm. 4) Repeat #1 above. 5) Imagine a river running in a line from the earlobe straight down the neck.  Place hands just behind this and do circles with stretch coming forward slightly and down, thinking about fluid flowing down that river.  Do 10 times. 6) Place one hand just in front of the river on one side and do circles with a slight back and then downward stretch, thinking again about putting that fluid in the river.  Do 10-20 times on each side. 7) Place one hand just slightly in front of the spot you just did and do the same thing.  DO THIS VERY GENTLY. 8) Use the webspace between your thumb and index finger to pump downward starting just under the chin and "stair stepping" downward with a stretch, working down to the chest. 9) Repeat #1. 10) Do stationary circles on each side of the face just above the chin, out and down with the stretch 10 times. 11) Do stationary circles on each side of the face on the cheeks with pressure going back and down, 10 times. 12) Do stationary circles on each side of the face between the eyes and ears, again back and downward 10 times. 13) Repeat steps 9,8,7,6,5,1,3 and 2 in that order!  Do not slide on the skin, but STRETCH it with your motions. Only give enough pressure to stretch the skin. DO THIS SLOWLY, PLEASE!  And do once a day.  Cancer Rehab [phone]  

## 2021-02-19 NOTE — Therapy (Signed)
[place], [place], [address] Phone: [phone]   Fax:  [phone]  Physical Therapy Evaluation  Patient Details  Name: Justin Baldwin MRN: [id number] Date of Birth: Feb 25, 1976 Referring Provider (PT): Dr. Isidore Moos   Encounter Date: 02/19/2021   PT End of Session - 02/19/21 0852    Visit Number 3    Number of Visits 6    Date for PT Re-Evaluation 04/02/21    Authorization Type [employer]    Authorization - Visit Number 3    Authorization - Number of Visits 30    PT Start Time 0802    PT Stop Time 9024    PT Time Calculation (min) 45 min    Activity Tolerance Patient tolerated treatment well    Behavior During Therapy [facility] for tasks assessed/performed           Past Medical History:  Diagnosis Date  . Diabetes mellitus without complication ([place])   . Erectile dysfunction   . Insomnia   . Skin abnormality    hidradenitis suppurativa     Past Surgical History:  Procedure Laterality Date  . IR GASTROSTOMY TUBE MOD SED  11/21/2020  . IR IMAGING GUIDED PORT INSERTION  11/21/2020  . IRRIGATION AND DEBRIDEMENT ABSCESS N/A 09/22/2018   Procedure: IRRIGATION AND DEBRIDEMENT ABSCESS;  Surgeon: Ralene Ok, MD;  Location: [facility];  Service: General;  Laterality: N/A;    There were no vitals filed for this visit.    Subjective Assessment - 02/19/21 0806    Subjective Getting more heavy and swollen the past 3 weeks. I still use the foam and it seems to help.  I have been trying to do the massage    Pertinent History Pt had a cold in September and throat pain and ear pain did not resolve.  He had several rounds of antibiotics with no improvement.  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 He had concurrent chemo and radiation.  He currently has a Peg but not using it and the port but no current treatment    Patient  Stated Goals what to do for the swelling    Currently in Pain? Yes    Pain Score 2     Pain Location Mouth    Pain Orientation Right;Left    Pain Descriptors / Indicators Constant    Pain Type Chronic pain    Pain Onset More than a month ago    Pain Frequency Constant              [facility] PT Assessment - 02/19/21 0001      Assessment   Medical Diagnosis Left tonsilar squamos Cell Carcinoma    Referring Provider (PT) Dr. Isidore Moos    Onset Date/Surgical Date 09/19/20    Hand Dominance Right    Prior Therapy yes      Precautions   Precaution Comments lymphedema      Balance Screen   Has the patient fallen in the past 6 months No    Has the patient had a decrease in activity level because of a fear of falling?  No    Is the patient reluctant to leave their home because of a fear of falling?  No      Home Environment   Living Environment Private residence    Living Arrangements Spouse/significant other;Children    Available Help at Discharge Family  Prior Function   Level of Independence Independent    Vocation Full time employment    Forensic psychologist    Leisure goes to gym      Cognition   Overall Cognitive Status Within Functional Limits for tasks assessed      Observation/Other Assessments   Observations mild soft edema submentally             LYMPHEDEMA/ONCOLOGY QUESTIONNAIRE - 02/19/21 0001      Head and Neck   4 cm superior to sternal notch around neck 43.5 cm (P)     6 cm superior to sternal notch around neck 42.5 cm (P)     Other 23.5 (P)     Other not pitting (P)                    Objective measurements completed on examination: See above findings.       [facility] Adult PT Treatment/Exercise - 02/19/21 0001      Manual Therapy   Edema Management measured pt for marena garment with ordering instruction, made flat foam for use in garment, instruction in self MLD in seated in front of the mirror using anterior norton  approach handout.  Pt has been doing a version of self MLD and did not need many vcs except for switching from skin sliding to skin stretch                  PT Education - 02/19/21 0852    Education Details self MLD, use of compression, POC, pump options    Person(s) Educated Patient    Methods Explanation;Demonstration;Tactile cues;Verbal cues;Handout    Comprehension Verbalized understanding;Returned demonstration               PT Long Term Goals - 02/19/21 0856      PT LONG TERM GOAL #1   Title Pt will be independent in a HEP for cervical ROM and will note decreased tightness and improved mobility    Status Achieved      PT LONG TERM GOAL #2   Title Pt will be independent in self neck MLD    Status Partially Met      PT LONG TERM GOAL #3   Title Pt will have appropriate compression garments prn    Status Not Met      PT LONG TERM GOAL #4   Title Pt will decrease tragus to tragus by 2 cm    Baseline 23    Time 6    Period Weeks    Status New                  Plan - 02/19/21 8088    Clinical Impression Statement Pt retruns to PT after 1 month or more with increased submental soft edema x 3 weeks.  Pt has been using chip pack and performing self MLD learned online frequently.  Pt has increased measurements around 2cm overall.  Pt was measured for garment and given information on pump.  Demo sent to [employer].  Gave pt official handout on MLD with review of technique with min cues only needed.  Pt will return around 1 week to see if garment and self MLD are working and if any changes are needed.    PT Frequency 1x / week    PT Duration 6 weeks    PT Treatment/Interventions ADLs/Self Care Home Management;Therapeutic exercise;Manual techniques;Patient/family education;Manual lymph drainage;Passive range of motion;Vasopneumatic Device;Taping    PT  Next Visit Plan how is garment and foam? self MLD?, pump benefits back? perform self MLD/STM prn, schedule follow  ups prn    Consulted and Agree with Plan of Care Patient           Patient will benefit from skilled therapeutic intervention in order to improve the following deficits and impairments:     Visit Diagnosis: Abnormal posture  Malignant neoplasm of tonsil (HCC)  Generalized edema     Problem List Patient Active Problem List   Diagnosis Date Noted  . Lymphedema 02/06/2021  . Drug induced constipation 01/13/2021  . Mucositis due to antineoplastic therapy 01/12/2021  . Weight loss, unintentional 01/12/2021  . Port-A-Cath in place 12/04/2020  . Cancer of tonsillar fossa ([place]) 10/01/2020  . Type 2 diabetes mellitus without complication, without long-term current use of insulin ([place]) 09/03/2019  . Hyperglycemia 09/03/2019  . Gastroesophageal reflux disease 03/06/2019  . Atherosclerosis of aorta ([place]) 11/30/2018  . Dizziness 11/30/2018  . Medication monitoring encounter 10/31/2018  . Atypical mycobacterium infection   . Abscess of buttock, left 09/22/2018  . Hydradenitis 09/22/2018  . Kidney insufficiency 09/22/2018  . Lump 08/31/2018  . Deep tissue injury 08/25/2018  . Skin rash 07/25/2018  . Fatigue 11/08/2017  . Vertigo 07/11/2017  . Preventative health care 03/29/2016  . Insomnia 03/15/2016  . Erectile dysfunction 03/15/2016    Stark Bray 02/19/2021, 8:58 AM  [place], [place], [address] Phone: [phone]   Fax:  [phone]  Name: Justin Baldwin MRN: [id number] Date of Birth: February 01, 1976

## 2021-02-27 ENCOUNTER — Other Ambulatory Visit: Payer: Self-pay

## 2021-02-27 ENCOUNTER — Ambulatory Visit (HOSPITAL_COMMUNITY)
Admission: RE | Admit: 2021-02-27 | Discharge: 2021-02-27 | Disposition: A | Discharge status: Home / Self Care | Payer: BC Managed Care – PPO | Source: Ambulatory Visit | Attending: Hematology and Oncology | Admitting: Hematology and Oncology

## 2021-02-27 DIAGNOSIS — Z431 Encounter for attention to gastrostomy: Secondary | ICD-10-CM | POA: Diagnosis not present

## 2021-02-27 DIAGNOSIS — C090 Malignant neoplasm of tonsillar fossa: Secondary | ICD-10-CM | POA: Insufficient documentation

## 2021-02-27 HISTORY — PX: IR GASTROSTOMY TUBE REMOVAL: IMG5492

## 2021-02-27 MED ORDER — LIDOCAINE VISCOUS HCL 2 % MT SOLN
OROMUCOSAL | Status: AC
  Administered 2021-02-27: 2 mL
  Filled 2021-02-27: qty 15

## 2021-03-05 DIAGNOSIS — F418 Other specified anxiety disorders: Secondary | ICD-10-CM | POA: Diagnosis not present

## 2021-03-06 ENCOUNTER — Encounter: Payer: Self-pay | Admitting: Rehabilitation

## 2021-03-06 ENCOUNTER — Ambulatory Visit: Payer: BC Managed Care – PPO | Attending: Radiation Oncology | Admitting: Rehabilitation

## 2021-03-06 ENCOUNTER — Other Ambulatory Visit: Payer: Self-pay

## 2021-03-06 DIAGNOSIS — C099 Malignant neoplasm of tonsil, unspecified: Secondary | ICD-10-CM | POA: Insufficient documentation

## 2021-03-06 DIAGNOSIS — R293 Abnormal posture: Secondary | ICD-10-CM | POA: Insufficient documentation

## 2021-03-06 DIAGNOSIS — R601 Generalized edema: Secondary | ICD-10-CM | POA: Diagnosis not present

## 2021-03-06 NOTE — Therapy (Signed)
[place], [place], [address] Phone: [phone]   Fax:  [phone]  Physical Therapy Treatment  Patient Details  Name: RAY GERVASI MRN: [id number] Date of Birth: Jan 27, 1976 Referring Provider (PT): Dr. Isidore Moos   Encounter Date: 03/06/2021   PT End of Session - 03/06/21 1158    Visit Number 4    Number of Visits 6    Date for PT Re-Evaluation 04/02/21    Authorization - Visit Number 4    PT Start Time 0903    PT Stop Time 0957    PT Time Calculation (min) 54 min    Activity Tolerance Patient tolerated treatment well    Behavior During Therapy [facility] for tasks assessed/performed           Past Medical History:  Diagnosis Date  . Diabetes mellitus without complication ([place])   . Erectile dysfunction   . Insomnia   . Skin abnormality    hidradenitis suppurativa     Past Surgical History:  Procedure Laterality Date  . IR GASTROSTOMY TUBE MOD SED  11/21/2020  . IR GASTROSTOMY TUBE REMOVAL  02/27/2021  . IR IMAGING GUIDED PORT INSERTION  11/21/2020  . IRRIGATION AND DEBRIDEMENT ABSCESS N/A 09/22/2018   Procedure: IRRIGATION AND DEBRIDEMENT ABSCESS;  Surgeon: Ralene Ok, MD;  Location: [facility];  Service: General;  Laterality: N/A;    There were no vitals filed for this visit.   Subjective Assessment - 03/06/21 0903    Subjective I think the massage is going well.    Pertinent History Pt had a cold in September and throat pain and ear pain did not resolve.  He had several rounds of antibiotics with no improvement.  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 He had concurrent chemo and radiation.  He currently has a Peg but not using it and the port but no current treatment    Patient Stated Goals what to do for the swelling    Currently in Pain? No/denies                 LYMPHEDEMA/ONCOLOGY  QUESTIONNAIRE - 03/06/21 0001      Head and Neck   4 cm superior to sternal notch around neck 42.5 cm    6 cm superior to sternal notch around neck 42.3 cm    8 cm superior to sternal notch around neck 43 cm    Other 22.5                      [facility] Adult PT Treatment/Exercise - 03/06/21 0001      Self-Care   Other Self-Care Comments  Answered all questions regarding pump: how it is not required but beneficial if you are struggling with self MLD or internal edema, prognosis for edema, time frame for radiation rebound and swelling      Manual Therapy   Manual Therapy Manual Lymphatic Drainage (MLD)    Edema Management made foam pad smaller, assessed fit of garment and recommended wearing it on the chin further, and pt was shown swell spot neck garment to order    Manual Lymphatic Drainage (MLD) performed in supine: short neck, 5 abdominal breaths, bil axillary nodes, chest clearance, then anterior and lateral neck working towards axillae  PT Long Term Goals - 02/19/21 0856      PT LONG TERM GOAL #1   Title Pt will be independent in a HEP for cervical ROM and will note decreased tightness and improved mobility    Status Achieved      PT LONG TERM GOAL #2   Title Pt will be independent in self neck MLD    Status Partially Met      PT LONG TERM GOAL #3   Title Pt will have appropriate compression garments prn    Status Not Met      PT LONG TERM GOAL #4   Title Pt will decrease tragus to tragus by 2 cm    Baseline 23    Time 6    Period Weeks    Status New                 Plan - 03/06/21 1159    Clinical Impression Statement Pt demonstrates 1.5cm decrease in submental edema from evaluation demonstrating self MLD and use of garment is effective.  Pt was wearing the chin strap a bit low on the chin so this was corrected today as well as the foam pad trimmed.  Pt with improved appearance and palpation to edema.  pt will check in  again after getting new garment and using this for a few weeks.    PT Frequency 1x / week    PT Duration 6 weeks    PT Treatment/Interventions ADLs/Self Care Home Management;Therapeutic exercise;Manual techniques;Patient/family education;Manual lymph drainage;Passive range of motion;Vasopneumatic Device;Taping    PT Next Visit Plan how is garment and foam? self MLD?, perform self MLD/STM prn, schedule follow ups prn    Consulted and Agree with Plan of Care Patient           Patient will benefit from skilled therapeutic intervention in order to improve the following deficits and impairments:     Visit Diagnosis: Abnormal posture  Malignant neoplasm of tonsil (HCC)  Generalized edema     Problem List Patient Active Problem List   Diagnosis Date Noted  . Lymphedema 02/06/2021  . Drug induced constipation 01/13/2021  . Mucositis due to antineoplastic therapy 01/12/2021  . Weight loss, unintentional 01/12/2021  . Port-A-Cath in place 12/04/2020  . Cancer of tonsillar fossa ([place]) 10/01/2020  . Type 2 diabetes mellitus without complication, without long-term current use of insulin ([place]) 09/03/2019  . Hyperglycemia 09/03/2019  . Gastroesophageal reflux disease 03/06/2019  . Atherosclerosis of aorta ([place]) 11/30/2018  . Dizziness 11/30/2018  . Medication monitoring encounter 10/31/2018  . Atypical mycobacterium infection   . Abscess of buttock, left 09/22/2018  . Hydradenitis 09/22/2018  . Kidney insufficiency 09/22/2018  . Lump 08/31/2018  . Deep tissue injury 08/25/2018  . Skin rash 07/25/2018  . Fatigue 11/08/2017  . Vertigo 07/11/2017  . Preventative health care 03/29/2016  . Insomnia 03/15/2016  . Erectile dysfunction 03/15/2016    Stark Bray 03/06/2021, 12:02 PM  [place], [place], [address] Phone: [phone]   Fax:  [phone]  Name: NHIA HEAPHY MRN: [id number] Date of Birth:  23-Apr-1976

## 2021-03-10 ENCOUNTER — Other Ambulatory Visit: Payer: Self-pay

## 2021-03-10 ENCOUNTER — Encounter: Payer: Self-pay | Admitting: Hematology and Oncology

## 2021-03-10 ENCOUNTER — Inpatient Hospital Stay: Payer: BC Managed Care – PPO | Attending: Hematology and Oncology | Admitting: Hematology and Oncology

## 2021-03-10 DIAGNOSIS — E119 Type 2 diabetes mellitus without complications: Secondary | ICD-10-CM | POA: Insufficient documentation

## 2021-03-10 DIAGNOSIS — R634 Abnormal weight loss: Secondary | ICD-10-CM | POA: Diagnosis not present

## 2021-03-10 DIAGNOSIS — Z9221 Personal history of antineoplastic chemotherapy: Secondary | ICD-10-CM | POA: Insufficient documentation

## 2021-03-10 DIAGNOSIS — K1231 Oral mucositis (ulcerative) due to antineoplastic therapy: Secondary | ICD-10-CM | POA: Insufficient documentation

## 2021-03-10 DIAGNOSIS — Z87891 Personal history of nicotine dependence: Secondary | ICD-10-CM | POA: Insufficient documentation

## 2021-03-10 DIAGNOSIS — Z923 Personal history of irradiation: Secondary | ICD-10-CM | POA: Insufficient documentation

## 2021-03-10 DIAGNOSIS — Z79899 Other long term (current) drug therapy: Secondary | ICD-10-CM | POA: Insufficient documentation

## 2021-03-10 DIAGNOSIS — Z794 Long term (current) use of insulin: Secondary | ICD-10-CM

## 2021-03-10 DIAGNOSIS — I890 Lymphedema, not elsewhere classified: Secondary | ICD-10-CM | POA: Diagnosis not present

## 2021-03-10 DIAGNOSIS — C090 Malignant neoplasm of tonsillar fossa: Secondary | ICD-10-CM | POA: Diagnosis not present

## 2021-03-10 NOTE — Assessment & Plan Note (Signed)
This is likely long-term complication from radiation.  Encouraged to continue physical therapy exercises I have also sent a message to our PT team to see if he can benefit from any additional treatment.

## 2021-03-10 NOTE — Assessment & Plan Note (Signed)
Weight has been stable since last visit, measuring 195 pounds.  He feels healthy and he has been eating well, had his G-tube removed since he has not been using this.  Encouraged him to eat balanced diet and continue to exercise.  He would like to remain around 200 pounds.

## 2021-03-10 NOTE — Assessment & Plan Note (Signed)
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  Since last visit, he has been doing very well except for some lymphedema related issues.  No residual side effects from chemotherapy. His end of the treatment PET/CT is yet to be scheduled, sent in in basket message to Dr. Pearlie Oyster nursing team as well as our nurse navigation team.  He was instructed to reach out back to [place] if he still does not hear about the PET/CT. He was recommended to establish back with Dr. Redmond Baseman for follow-up for ENT for periodic checkups.  I will plan to see him back in August since he is seeing Dr. Isidore Moos next month.

## 2021-03-10 NOTE — Assessment & Plan Note (Signed)
Patient has not been taking any insulin or metformin since he lost about 25 pounds of weight.  I have encouraged him to set up a follow-up with his PCP as soon as possible to monitor his diabetic parameters and to see if he needs to go back on some of his medication that he has been taking for the past 3 months.  He will go ahead and schedule his appointment as soon as possible.

## 2021-03-10 NOTE — Progress Notes (Signed)
[facility] FOLLOW UP NOTE  Patient Care Team: Pleas [name], NP as PCP - General (Internal Medicine) Festus Aloe, MD as Consulting Physician (Urology) Melida Quitter, MD as Consulting Physician (Otolaryngology) Karie Mainland, RD as Dietitian (Nutrition) Eppie Gibson, MD as Consulting Physician (Radiation Oncology) Malmfelt, Stephani Police, RN as Oncology Nurse Navigator Benay Pike, MD as Consulting Physician (Hematology and Oncology)  CHIEF COMPLAINTS/PURPOSE OF CONSULTATION:  SCC tonsillar fossa, follow up after completion of CRT.  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  Since last visit, he has been doing very well except for some lymphedema related issues.  No residual side effects from chemotherapy. His end of the treatment PET/CT is yet to be scheduled, sent in in basket message to Dr. Pearlie Oyster nursing team as well as our nurse navigation team.  He was instructed to reach out back to [place] if he still does not hear about the PET/CT. He was recommended to establish back with Dr. Redmond Baseman for follow-up for ENT for periodic checkups.  I will plan to see him back in August since he is seeing Dr. Isidore Moos next month.  Mucositis due to antineoplastic therapy This has completely resolved.  Weight loss, unintentional Weight has been stable since last visit, measuring 195 pounds.  He feels healthy and he has been eating well, had his G-tube removed since he has not been using this.  Encouraged him to eat balanced diet and continue to exercise.  He would like to remain around 200 pounds.  Type 2 diabetes mellitus  ([place]) Patient has not been taking any insulin or metformin since he lost about 25 pounds of weight.  I have encouraged him to set up a follow-up with his PCP as soon as possible to monitor his diabetic parameters and to see if he needs to go back on some of his medication that he has been taking for the past 3 months.  He will go ahead and schedule his appointment as soon as possible.  Lymphedema This is likely long-term complication from radiation.  Encouraged to continue physical therapy exercises I have also sent a message to our PT team to see if he can benefit from any additional treatment.  No orders of the defined types were placed in this encounter.    HISTORY OF PRESENTING ILLNESS:  Justin Baldwin 45 y.o. male is here because of SCC tonsillar fossa s.p CRT  Oncology History Overview Note  Justin Baldwin is a 45 y.o. male who presented with ongoing head cold symptoms, sinus issues, left ear pain, and left-sided throat pain since September.  When the cold-like symptoms resolved, the throat and ear pain did not. Augmentin was prescribed without relief.   Subsequently, the patient saw Dr. Redmond Baseman, ENT at [facility], who noted that the left tonsil had a mass appearance on 09/19/2020. 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   Pertinent imaging thus far includes soft tissue neck CT scan on 09/30/2020 revealed a left tonsillar mass abutting the glossotonsillar sulcus and base of the tongue, consistent with known malignancy. Left level 2/3 cystic or necrotic adenopathy reflected nodal metastasis.   CT  scan of chest performed on 09/30/2020 revealing a non-specific left lower lobe pulmonary micronodule but no acute intrathoracic abnormality and no definite intrathoracic metastasis.  I have personally reviewed his imaging and discussed him with Dr. Redmond Baseman.     He had a PET scan which showed hypermetabolic left tonsillar mass and bilateral lymphadenopathy  consistent with metastatic disease.  Hence recommendation was to proceed with concurrent chemoradiation.  He started radiation on 10/28/2020 chemotherapy delayed to 11/07/2020 because of COVID-19 infection.   Completed two cycles of cisplatin at a dose of 100 mg/m2.   Cancer of tonsillar fossa ([place])  10/01/2020 Initial Diagnosis   Cancer of tonsillar fossa ([place])   10/01/2020 Cancer Staging   Staging form: Pharynx - HPV-Mediated Oropharynx, AJCC 8th Edition - Clinical stage from 10/01/2020: Stage II (cT3, cN2, cM0, p16+) - Signed by Eppie Gibson, MD on 02/11/2021 Stage prefix: Initial diagnosis   11/07/2020 - 12/03/2020 Chemotherapy   Completed 2 cycles of cisplatin at 100 mg/m2         INTERIM HISTORY  Justin Baldwin is here for a FU. He is doing well overall. Since his last visit, he had an episode of swelling of left eye which resolved with some antibiotics in 2 days.  Then he continues to have some issues with neck swelling basically lymphedema.  He has been doing his massages and exercises which has helped him very much but for the past week or so he has noticed that the swelling bothers him on the inside.  He describes it as a feeling that things are collapsing inside from the swelling.  This causes him some discomfort but nothing enough to take any pain medicine.  He is continuing to follow-up with her physical therapist.  Nutrition wise, he has not gained much weight since last time but he continues to eat everything.  He has had his G-tube removed.  He otherwise feels well, back to exercising every day and back to work.  No change in breathing, bowel habits or urinary habits.  No neuropathy or hearing deficit from chemotherapy. Rest of the pertinent 10 point ROS reviewed and negative.  REVIEW OF SYSTEMS:   Constitutional: Denies fevers, chills or abnormal night sweats Eyes: Denies blurriness of vision, double vision or watery eyes Ears, nose, mouth, throat, and face: Denies mucositis or sore  throat Respiratory: Denies cough, dyspnea or wheezes Cardiovascular: Denies palpitation, chest discomfort or lower extremity swelling Gastrointestinal:  Denies nausea, heartburn or change in bowel habits Skin: Denies abnormal skin rashes Lymphatics: Denies new lymphadenopathy or easy bruising Neurological:Denies numbness, tingling or new weaknesses Behavioral/Psych: Mood is stable, no new changes  All other systems were reviewed with the patient and are negative.   MEDICAL HISTORY:  Past Medical History:  Diagnosis Date  . Diabetes mellitus without complication ([place])   . Erectile dysfunction   . Insomnia   . Skin abnormality    hidradenitis suppurativa     SURGICAL HISTORY: Past Surgical History:  Procedure Laterality Date  . IR GASTROSTOMY TUBE MOD SED  11/21/2020  . IR GASTROSTOMY TUBE REMOVAL  02/27/2021  . IR IMAGING GUIDED PORT INSERTION  11/21/2020  . IRRIGATION AND DEBRIDEMENT ABSCESS N/A 09/22/2018   Procedure: IRRIGATION AND DEBRIDEMENT ABSCESS;  Surgeon: Ralene Ok, MD;  Location: [facility];  Service: General;  Laterality: N/A;    SOCIAL HISTORY: Social History   Socioeconomic History  . Marital status: Married    Spouse name: Not on file  . Number of children: Not on  file  . Years of education: Not on file  . Highest education level: Not on file  Occupational History  . Not on file  Tobacco Use  . Smoking status: Former Smoker    Packs/day: 1.00    Types: Cigarettes    Quit date: 10/27/2020    Years since quitting: 0.3  . Smokeless tobacco: Former Systems developer    Quit date: 02/14/2016  Vaping Use  . Vaping Use: Never used  Substance and Sexual Activity  . Alcohol use: Not Currently    Alcohol/week: 5.0 standard drinks    Types: 5 Shots of liquor per week    Comment: occasinal   . Drug use: No  . Sexual activity: Yes  Other Topics Concern  . Not on file  Social History Narrative   ** Merged History Encounter **       Married. 2 children. Works as a  Nutritional therapist Enjoys spending time with family.    Social Determinants of Health   Financial Resource Strain: Low Risk   . Difficulty of Paying Living Expenses: Not hard at all  Food Insecurity: No Food Insecurity  . Worried About Charity fundraiser in the Last Year: Never true  . Ran Out of Food in the Last Year: Never true  Transportation Needs: No Transportation Needs  . Lack of Transportation (Medical): No  . Lack of Transportation (Non-Medical): No  Physical Activity: Not on file  Stress: Not on file  Social Connections: Not on file  Intimate Partner Violence: Not on file    FAMILY HISTORY: Family History  Problem Relation Age of Onset  . Heart attack Father   . Emphysema Paternal Grandmother   . Arthritis Mother     ALLERGIES:  has No Known Allergies.  MEDICATIONS:  Current Outpatient Medications  Medication Sig Dispense Refill  . antiseptic oral rinse (BIOTENE) LIQD 15 mLs by Mouth Rinse route as needed for dry mouth or mouth pain. Use 15 ml every 8-12 hrs, Rinse and spit. 15 mL 1  . blood glucose meter kit and supplies KIT Dispense based on patient and insurance preference. Use up to four times daily as directed. (FOR ICD-9 250.00, 250.01). 1 each 0  . fluconazole (DIFLUCAN) 100 MG tablet Take 2 tablets today, and 1 tablet daily for 6 more days. Hold Rosuvastatin while on this. 8 tablet 0  . glucose blood test strip Use three times daily as instructed 100 each 3  . insulin glargine (LANTUS) 100 UNIT/ML injection Inject 0.1 mLs (10 Units total) into the skin daily. 10 mL 2  . lidocaine (XYLOCAINE) 2 % solution mix 1 part lidocaine WITH 1 part WATER, SWISH AND swallow 53m OF mixture 30 MINUTES BEFORE MEALS AND AT BEDTIME UP TO 4 TIMES DAILY 200 mL 4  . magic mouthwash (nystatin, lidocaine, diphenhydrAMINE) suspension SWISH, GARGLE, AND spit 5 ML BY MOUTH EVERY 4 TO 6 HOURS as needed for pain. Wait 30 minutes AFTER using TO eat OR drink 180 mL 0  . magnesium oxide  (MAG-OX) 400 (241.3 Mg) MG tablet Take 1 tablet (400 mg total) by mouth 2 (two) times daily. 20 tablet 0  . metFORMIN (GLUCOPHAGE XR) 500 MG 24 hr tablet Take 1 tablet (500 mg total) by mouth daily with breakfast. For diabetes. 90 tablet 3  . methylPREDNISolone (MEDROL DOSEPAK) 4 MG TBPK tablet Take as instructed 21 each 0  . Nutritional Supplements (FEEDING SUPPLEMENT, OSMOLITE 1.5 CAL,) LIQD 2 cartons Osmolite 1.5 via PEG twice daily with  120 mL free water before and after.  1-1/2 cartons Osmolite 1.5 via PEG twice daily with 120 mL free water before and after.  Mix 30 mL Prosource no carb or equivalent with 30 mL water and flush with 210 mL water twice daily via PEG.  Provides 2605 cal, 134 g protein, 2707 mL free water/100% estimated needs.  0  . omeprazole (PRILOSEC) 20 MG capsule TAKE 1 CAPSULE BY MOUTH DAILY FOR HEARTBURN 90 capsule 3  . ondansetron (ZOFRAN) 8 MG tablet Take 1 tablet (8 mg total) by mouth 2 (two) times daily as needed. Start on the third day after cisplatin chemotherapy. 30 tablet 1  . oxymetazoline (AFRIN) 0.05 % nasal spray Place 1-2 sprays into both nostrils as needed for congestion.     . prochlorperazine (COMPAZINE) 10 MG tablet Take 1 tablet (10 mg total) by mouth every 6 (six) hours as needed (Nausea or vomiting). 30 tablet 1  . rosuvastatin (CRESTOR) 40 MG tablet Take 1 tablet (40 mg total) by mouth every evening. For cholesterol. 90 tablet 3  . scopolamine (TRANSDERM-SCOP) 1 MG/3DAYS Place 1 patch (1.5 mg total) onto the skin every 3 (three) days. 10 patch 1  . tadalafil (CIALIS) 5 MG tablet Take 1 tablet (5 mg total) by mouth daily as needed for erectile dysfunction. 90 tablet 0   No current facility-administered medications for this visit.    PHYSICAL EXAMINATION: ECOG PERFORMANCE STATUS: 0 - Asymptomatic  Vitals:   03/10/21 0925  BP: 129/79  Pulse: 77  Resp: 18  Temp: 97.6 F (36.4 C)  SpO2: 98%   Filed Weights   03/10/21 0925  Weight: 195 lb (88.5 kg)     GENERAL:alert, no distress and comfortable SKIN: skin color, texture, turgor are normal, no rashes or significant lesions EYES: normal, conjunctiva are pink and non-injected, sclera clear OROPHARYNX:no exudate, no erythema and lips, buccal mucosa, and tongue normal.  Bilateral tonsillar region appears clear without any concern for recurrence. NECK: supple, thyroid normal size, non-tender, without nodularity LYMPH:  no palpable lymphadenopathy in the cervical, axillary or inguinal LUNGS: clear to auscultation and percussion with normal breathing effort HEART: regular rate & rhythm and no murmurs and no lower extremity edema ABDOMEN:abdomen soft, non-tender and normal bowel sounds Musculoskeletal:no cyanosis of digits and no clubbing  PSYCH: alert & oriented x 3 with fluent speech NEURO: no focal motor/sensory deficits  LABORATORY DATA:  I have reviewed the data as listed Lab Results  Component Value Date   WBC 4.8 02/06/2021   HGB 15.5 02/06/2021   HCT 46.9 02/06/2021   MCV 92.9 02/06/2021   PLT 219 02/06/2021     Chemistry      Component Value Date/Time   NA 140 02/06/2021 0857   NA 135 09/12/2019 1048   K 3.9 02/06/2021 0857   CL 99 02/06/2021 0857   CO2 30 02/06/2021 0857   BUN 14 02/06/2021 0857   BUN 18 09/12/2019 1048   CREATININE 1.29 (H) 02/06/2021 0857   CREATININE 1.28 (H) 12/11/2020 0816   CREATININE 1.38 (H) 11/30/2018 1102      Component Value Date/Time   CALCIUM 8.5 (L) 02/06/2021 0857   ALKPHOS 43 10/15/2020 1132   AST 35 10/15/2020 1132   ALT 79 (H) 10/15/2020 1132   BILITOT 0.8 10/15/2020 1132       RADIOGRAPHIC STUDIES: I have personally reviewed the radiological images as listed and agreed with the findings in the report. IR GASTROSTOMY TUBE REMOVAL  Result Date: 02/27/2021  INDICATION: Patient with history of head neck cancer status post gastrostomy tube placement 11/21/20 by Dr. Annamaria Boots. He has completed therapy and is able to maintain nutrition  and hydration by mouth. Request is made for removal. EXAM: BEDSIDE REMOVAL OF GASTROSTOMY TUBE COMPARISON:  None. MEDICATIONS: 10 mL viscous lidocaine CONTRAST:  None FLUOROSCOPY TIME:  None COMPLICATIONS: None immediate. PROCEDURE: A time-out was performed prior to the initiation of the procedure. The track of the existing gastrostomy tube was lubricated with viscous lidocaine. Using manual traction, the existing pull-through gastrostomy tube was removed intact. A dressing was placed. The patient tolerated the procedure well without immediate postprocedural complication. IMPRESSION: Successful bedside removal of pull-through gastrostomy tube without complication Read by: Brynda Greathouse PA-C Electronically Signed   By: Aletta Edouard M.D.   On: 02/27/2021 12:58    All questions were answered. The patient knows to call the clinic with any problems, questions or concerns.      Benay Pike, MD 03/10/2021 10:23 AM

## 2021-03-10 NOTE — Assessment & Plan Note (Signed)
This has completely resolved.

## 2021-03-11 ENCOUNTER — Encounter: Payer: Self-pay | Admitting: Rehabilitation

## 2021-03-18 ENCOUNTER — Other Ambulatory Visit: Payer: Self-pay | Admitting: Primary Care

## 2021-03-18 DIAGNOSIS — K219 Gastro-esophageal reflux disease without esophagitis: Secondary | ICD-10-CM

## 2021-03-19 DIAGNOSIS — F418 Other specified anxiety disorders: Secondary | ICD-10-CM | POA: Diagnosis not present

## 2021-03-19 DIAGNOSIS — L03213 Periorbital cellulitis: Secondary | ICD-10-CM | POA: Diagnosis not present

## 2021-04-01 ENCOUNTER — Encounter: Payer: Self-pay | Admitting: Hematology and Oncology

## 2021-04-02 DIAGNOSIS — F418 Other specified anxiety disorders: Secondary | ICD-10-CM | POA: Diagnosis not present

## 2021-04-03 ENCOUNTER — Telehealth: Payer: Self-pay | Admitting: *Deleted

## 2021-04-03 NOTE — Telephone Encounter (Signed)
CALLED PATIENT TO INFORM THAT I HAVE NO AVAILABLE EARLY FU SLOTS ON 04-17-21, I TOLD HIM THAT I WOULD SPEAK TO  DR. SQUIRE ON 04-06-21 AND SEE IF SHE WOULD AGREED TO SEE HIM ON Monday 04-13-21, PATIENT VERIFIED UNDERSTANDING THIS

## 2021-04-07 ENCOUNTER — Telehealth: Payer: Self-pay | Admitting: *Deleted

## 2021-04-07 NOTE — Telephone Encounter (Signed)
CALLED PATIENT TO INFORM THAT DR. SQUIRE WOULD BE GLAD TO SEE HIM ON A Monday, APPT. BOOKED FOR July 18 DUE TO DR. SQUIRE BEING ON VACATION, OR COVERING TWO SETS OF UNDER TREATS, PATIENT AGREED TO APPT. DAY AND TIME

## 2021-04-07 NOTE — Progress Notes (Signed)
Oncology Nurse Navigator Documentation   I spoke with Justin Baldwin last week regarding his message to Dr. Chryl Heck about saliva gland swelling and pain. It was recommended that he be seen by his ENT Dr. Redmond Baseman. Justin Baldwin voiced that he would call Dr. Redmond Baseman' office to set up an appointment that worked well with his schedule. I reviewed CareEverywhere today and Justin Baldwin is scheduled to see Dr. Redmond Baseman on 04/21/21 for evaluation. He knows to call me if I can help with anything else.  Harlow Asa RN, BSN, OCN Head & Neck Oncology Nurse [place] at [facility] Phone # [phone]  Fax # [phone]

## 2021-04-08 ENCOUNTER — Encounter: Payer: Self-pay | Admitting: Hematology and Oncology

## 2021-04-10 ENCOUNTER — Other Ambulatory Visit: Payer: Self-pay

## 2021-04-10 ENCOUNTER — Encounter: Payer: Self-pay | Admitting: Radiation Oncology

## 2021-04-10 ENCOUNTER — Ambulatory Visit (HOSPITAL_COMMUNITY)
Admission: RE | Admit: 2021-04-10 | Discharge: 2021-04-10 | Disposition: A | Discharge status: Home / Self Care | Payer: BC Managed Care – PPO | Source: Ambulatory Visit | Attending: Radiation Oncology | Admitting: Radiation Oncology

## 2021-04-10 ENCOUNTER — Other Ambulatory Visit: Payer: Self-pay | Admitting: Radiation Oncology

## 2021-04-10 ENCOUNTER — Ambulatory Visit: Payer: BC Managed Care – PPO | Admitting: Radiation Oncology

## 2021-04-10 DIAGNOSIS — C4492 Squamous cell carcinoma of skin, unspecified: Secondary | ICD-10-CM | POA: Diagnosis not present

## 2021-04-10 DIAGNOSIS — C099 Malignant neoplasm of tonsil, unspecified: Secondary | ICD-10-CM | POA: Insufficient documentation

## 2021-04-10 LAB — GLUCOSE, CAPILLARY: Glucose-Capillary: 61 mg/dL — ABNORMAL LOW (ref 70–99)

## 2021-04-10 MED ORDER — FLUDEOXYGLUCOSE F - 18 (FDG) INJECTION
10.7000 | Freq: Once | INTRAVENOUS | Status: AC
  Administered 2021-04-10: 10.7 via INTRAVENOUS

## 2021-04-10 NOTE — Progress Notes (Signed)
I spoke w/ patient by phone today to deliver his PET results  I personally reviewed the images and it demonstrates a complete response to treatment.  He is thrilled with this news.  He does have a history of chronic GI reflux which preceded his cancer diagnosis.  He is on omeprazole.  I recommended he talk to his PCP about further management of this as he continues to have reflux symptoms.  This corresponds with the SUV uptake in his esophagus on the scan.  I will see him back in July as he had to change his appointment time for personal commitments.  -----------------------------------  Eppie Gibson, MD

## 2021-04-17 ENCOUNTER — Ambulatory Visit: Payer: BC Managed Care – PPO | Admitting: Radiation Oncology

## 2021-04-21 DIAGNOSIS — K118 Other diseases of salivary glands: Secondary | ICD-10-CM | POA: Diagnosis not present

## 2021-04-21 DIAGNOSIS — C099 Malignant neoplasm of tonsil, unspecified: Secondary | ICD-10-CM | POA: Diagnosis not present

## 2021-04-29 ENCOUNTER — Other Ambulatory Visit: Payer: Self-pay | Admitting: Hematology and Oncology

## 2021-04-29 DIAGNOSIS — C090 Malignant neoplasm of tonsillar fossa: Secondary | ICD-10-CM

## 2021-05-05 DIAGNOSIS — F418 Other specified anxiety disorders: Secondary | ICD-10-CM | POA: Diagnosis not present

## 2021-05-06 NOTE — Telephone Encounter (Signed)
Called patient and gave information for [facility]. If he goes he will let our office know so that we can c/a appointment with Anda Kraft.

## 2021-05-06 NOTE — Telephone Encounter (Signed)
Called patient let know we need to see in office. Has been having pain x 3 weeks and is tolerable would like to hold off until Anda Kraft can see early in am. I have made appointment for 7/22 if any changes he will call our office.

## 2021-05-11 ENCOUNTER — Ambulatory Visit: Payer: BC Managed Care – PPO | Admitting: Radiation Oncology

## 2021-05-11 ENCOUNTER — Telehealth: Payer: Self-pay | Admitting: *Deleted

## 2021-05-11 NOTE — Telephone Encounter (Signed)
Called patient's wife- Justin Baldwin, to ask about her husband coming in tomorrow for a fu with Dr. Isidore Moos, lvm for a return call

## 2021-05-11 NOTE — Telephone Encounter (Signed)
CALLED PATIENT TO ALTER FU APPT. FROM TODAY TO TOMORROW, UNABLE TO LVM DUE TO NO ANSWERING MACHINE, WILL CALL LATER

## 2021-05-12 ENCOUNTER — Encounter: Payer: Self-pay | Admitting: Radiation Oncology

## 2021-05-12 ENCOUNTER — Ambulatory Visit
Admission: RE | Admit: 2021-05-12 | Discharge: 2021-05-12 | Disposition: A | Discharge status: Home / Self Care | Payer: BC Managed Care – PPO | Source: Ambulatory Visit | Attending: Radiation Oncology | Admitting: Radiation Oncology

## 2021-05-12 ENCOUNTER — Other Ambulatory Visit: Payer: Self-pay

## 2021-05-12 VITALS — BP 131/73 | HR 82 | Temp 97.8°F | Resp 18 | Ht 72.0 in | Wt 201.8 lb

## 2021-05-12 DIAGNOSIS — Z923 Personal history of irradiation: Secondary | ICD-10-CM | POA: Insufficient documentation

## 2021-05-12 DIAGNOSIS — C099 Malignant neoplasm of tonsil, unspecified: Secondary | ICD-10-CM | POA: Diagnosis not present

## 2021-05-12 DIAGNOSIS — Z08 Encounter for follow-up examination after completed treatment for malignant neoplasm: Secondary | ICD-10-CM | POA: Diagnosis not present

## 2021-05-12 DIAGNOSIS — C090 Malignant neoplasm of tonsillar fossa: Secondary | ICD-10-CM | POA: Insufficient documentation

## 2021-05-12 DIAGNOSIS — Z79899 Other long term (current) drug therapy: Secondary | ICD-10-CM | POA: Diagnosis not present

## 2021-05-12 NOTE — Progress Notes (Signed)
Patient is in for follow up. Was seen by ENT 3 weeks ago. Is now taking prilosec but states it is not working see his GP on Friday to request pepcid. He is doing weight is going up slowly.

## 2021-05-13 ENCOUNTER — Encounter: Payer: Self-pay | Admitting: Radiation Oncology

## 2021-05-13 NOTE — Progress Notes (Signed)
Radiation Oncology         [phone] ________________________________  Name: Justin Baldwin MRN: [id number]  Date: 05/12/2021  DOB: 02/07/1976  Follow-Up Visit Note  CC: Pleas [name], NP  Melida Quitter, MD  Diagnosis and Prior Radiotherapy:       ICD-10-CM   1. Cancer of tonsillar fossa ([facility])  C09.0       Cancer Staging Cancer of tonsillar fossa ([place]) Staging form: Pharynx - HPV-Mediated Oropharynx, AJCC 8th Edition - Clinical stage from 10/01/2020: Stage II (cT3, cN2, cM0, p16+) - Signed by Eppie Gibson, MD on 02/11/2021 Stage prefix: Initial diagnosis  Radiation Treatment Dates: 10/28/2020 through 12/15/2020 Site Technique Total Dose (Gy) Dose per Fx (Gy) Completed Fx Beam Energies  Tonsil, Left: HN_Lt_tonsil IMRT 70/70 2 35/35 6X    CHIEF COMPLAINT:  Here for follow-up and surveillance of tonsil cancer  Narrative:  The patient returns today for routine follow-up.   Patient is in for follow up. Was seen by ENT 3 weeks ago. Is now taking prilosec for chronic GI reflux but states it is not working see his GP on Friday to request pepcid. He is doing well; weight is going up slowly.   Wt Readings from Last 3 Encounters:  05/12/21 201 lb 12.8 oz (91.5 kg)  03/10/21 195 lb (88.5 kg)  02/06/21 195 lb 8 oz (88.7 kg)    Vitals:   05/12/21 1448  BP: 131/73  Pulse: 82  Resp: 18  Temp: 97.8 F (36.6 C)  SpO2: 97%                       ALLERGIES:  has No Known Allergies.  Meds: Current Outpatient Medications  Medication Sig Dispense Refill   blood glucose meter kit and supplies KIT Dispense based on patient and insurance preference. Use up to four times daily as directed. (FOR ICD-9 250.00, 250.01). 1 each 0   glucose blood test strip Use three times daily as instructed 100 each 3   lidocaine (XYLOCAINE) 2 % solution mix 1 part lidocaine WITH 1 part WATER, SWISH AND swallow 34m OF mixture 30 MINUTES BEFORE MEALS AND AT BEDTIME UP TO 4 TIMES DAILY 200 mL 4   magnesium  oxide (MAG-OX) 400 (241.3 Mg) MG tablet Take 1 tablet (400 mg total) by mouth 2 (two) times daily. 20 tablet 0   omeprazole (PRILOSEC) 20 MG capsule Take 20 mg by mouth daily.     oxymetazoline (AFRIN) 0.05 % nasal spray Place 1-2 sprays into both nostrils as needed for congestion.      tadalafil (CIALIS) 5 MG tablet Take 1 tablet (5 mg total) by mouth daily as needed for erectile dysfunction. 90 tablet 0   magic mouthwash (nystatin, lidocaine, diphenhydrAMINE) suspension SWISH, GARGLE, AND spit 5 ML BY MOUTH EVERY 4 TO 6 HOURS as needed for pain. Wait 30 minutes AFTER using TO eat OR drink (Patient not taking: Reported on 05/12/2021) 180 mL 0   Nutritional Supplements (FEEDING SUPPLEMENT, OSMOLITE 1.5 CAL,) LIQD 2 cartons Osmolite 1.5 via PEG twice daily with 120 mL free water before and after.  1-1/2 cartons Osmolite 1.5 via PEG twice daily with 120 mL free water before and after.  Mix 30 mL Prosource no carb or equivalent with 30 mL water and flush with 210 mL water twice daily via PEG.  Provides 2605 cal, 134 g protein, 2707 mL free water/100% estimated needs. (Patient not taking: Reported on 05/12/2021)  0  rosuvastatin (CRESTOR) 40 MG tablet Take 1 tablet (40 mg total) by mouth every evening. For cholesterol. (Patient not taking: Reported on 05/12/2021) 90 tablet 3   No current facility-administered medications for this encounter.    Physical Findings: The patient is in no acute distress. Patient is alert and oriented. Wt Readings from Last 3 Encounters:  05/12/21 201 lb 12.8 oz (91.5 kg)  03/10/21 195 lb (88.5 kg)  02/06/21 195 lb 8 oz (88.7 kg)    height is 6' (1.829 m) and weight is 201 lb 12.8 oz (91.5 kg). His temperature is 97.8 F (36.6 C). His blood pressure is 131/73 and his pulse is 82. His respiration is 18 and oxygen saturation is 97%. .  General: Alert and oriented, in no acute distress HEENT: Head is normocephalic. Extraocular movements are intact. Oropharynx and mouth are  notable for no thrush, no mucositis, no tumor Neck: no palpable adenopathy  Skin: Skin in treatment fields shows satisfactory healing over neck Psychiatric: Judgment and insight are intact. Affect is appropriate. CHEST CTAB HEART RRR EXT no edema   Lab Findings: Lab Results  Component Value Date   WBC 4.8 02/06/2021   HGB 15.5 02/06/2021   HCT 46.9 02/06/2021   MCV 92.9 02/06/2021   PLT 219 02/06/2021    Lab Results  Component Value Date   TSH 1.383 10/15/2020    Radiographic Findings: No results found.  Impression/Plan:    1) Head and Neck Cancer Status: NED  2) Nutritional Status:  no issues, tolerated diet well  Wt Readings from Last 3 Encounters:  05/12/21 201 lb 12.8 oz (91.5 kg)  03/10/21 195 lb (88.5 kg)  02/06/21 195 lb 8 oz (88.7 kg)   PEG tube: removed  3) Risk Factors: The patient has been educated about risk factors including alcohol and tobacco abuse; they understand that avoidance of alcohol and tobacco is important to prevent recurrences as well as other cancers  4) Swallowing: Continue SLP exercises, swallowing well.  5) Dental: Encouraged to continue regular followup with dentistry, and dental hygiene including fluoride rinses. I made a Referral back to Dr Benson [place].  6) Thyroid function:  Check annually in med onc Lab Results  Component Value Date   TSH 1.383 10/15/2020    7) Other: port a cath removal has been ordered by Dr Chryl Heck  8) Follow-up with med onc and ENT as scheduled; I will see him back in July 2023.  On date of service, in total, I spent 25 minutes on this encounter. Patient was seen in person. _____________________________________   Eppie Gibson, MD

## 2021-05-15 ENCOUNTER — Encounter: Payer: Self-pay | Admitting: Primary Care

## 2021-05-15 ENCOUNTER — Ambulatory Visit (INDEPENDENT_AMBULATORY_CARE_PROVIDER_SITE_OTHER): Payer: BC Managed Care – PPO | Admitting: Primary Care

## 2021-05-15 ENCOUNTER — Other Ambulatory Visit: Payer: Self-pay

## 2021-05-15 VITALS — BP 108/62 | HR 97 | Temp 98.3°F | Ht 72.0 in | Wt 204.0 lb

## 2021-05-15 DIAGNOSIS — G8929 Other chronic pain: Secondary | ICD-10-CM

## 2021-05-15 DIAGNOSIS — K219 Gastro-esophageal reflux disease without esophagitis: Secondary | ICD-10-CM

## 2021-05-15 DIAGNOSIS — M25551 Pain in right hip: Secondary | ICD-10-CM | POA: Diagnosis not present

## 2021-05-15 MED ORDER — FAMOTIDINE 20 MG PO TABS: 20.0000 mg | ORAL_TABLET | Freq: Every day | ORAL | 0 refills | Status: DC

## 2021-05-15 NOTE — Assessment & Plan Note (Signed)
Acute on chronic hip pain, improved with rest after intense exercise.   Discussed to refrain from intense exercise for now. Checking plain films. Suspect inflammation to the joint. Can trial NSAID's. Discussed PT vs ortho referral.  Await results

## 2021-05-15 NOTE — Patient Instructions (Signed)
Stop by [place] for your xray. No appointment needed.  Add famotidine 20 mg once daily along with your omeprazole 20 mg daily for acid reflux. Update me if no improvement.   It was a pleasure to see you today!

## 2021-05-15 NOTE — Progress Notes (Signed)
 Subjective:    Patient ID: Justin Baldwin, male    DOB: 07/03/1976, 44 y.o.   MRN: [id number]  HPI  Justin Baldwin is a very pleasant 44 y.o. male with a history of cancer of tonsillar fossa, GERD, type 2 diabetes, renal insufficiency who presents today to discuss hip pain and GERD.   Chronic to right hip for years for which he believes is secondary to poor body positioning throughout the years. His acute pain is located to his right hip which began after resuming intense exercise which was about one month ago. He's doing Cross Fit 6 days weekly, has not doing leg work outs for the last 4 days with some improvement.   Pain is constant, noticeable with sitting, standing, laying down. He's not taken anything OTC for symptoms. He denies numbness/tinging, radiation of pain. He does feel a sensation of fullness in the right hip joint.   Managed on omeprazole 20 mg for GERD. Symptoms of esophageal burning, occasional bloating, burning belching which occurs several days weekly despite use of omeprazole 20 mg daily. During these symptoms he will take 1 or 2 famotidine tablets OTC with relief. Patient endorses reflux was noted on last imaging per oncology.    Review of Systems  Musculoskeletal:  Positive for arthralgias.  Neurological:  Negative for weakness and numbness.        Past Medical History:  Diagnosis Date   Diabetes mellitus without complication (HCC)    Erectile dysfunction    Insomnia    Skin abnormality    hidradenitis suppurativa     Social History   Socioeconomic History   Marital status: Married    Spouse name: Not on file   Number of children: Not on file   Years of education: Not on file   Highest education level: Not on file  Occupational History   Not on file  Tobacco Use   Smoking status: Former    Packs/day: 1.00    Types: Cigarettes    Quit date: 10/27/2020    Years since quitting: 0.5   Smokeless tobacco: Former    Quit date: 02/14/2016  Vaping Use   Vaping  Use: Never used  Substance and Sexual Activity   Alcohol use: Not Currently    Alcohol/week: 5.0 standard drinks    Types: 5 Shots of liquor per week    Comment: occasinal    Drug use: No   Sexual activity: Yes  Other Topics Concern   Not on file  Social History Narrative   ** Merged History Encounter **       Married. 2 children. Works as a [employer] Enjoys spending time with family.    Social Determinants of Health   Financial Resource Strain: Low Risk    Difficulty of Paying Living Expenses: Not hard at all  Food Insecurity: No Food Insecurity   Worried About Running Out of Food in the Last Year: Never true   Ran Out of Food in the Last Year: Never true  Transportation Needs: No Transportation Needs   Lack of Transportation (Medical): No   Lack of Transportation (Non-Medical): No  Physical Activity: Not on file  Stress: Not on file  Social Connections: Not on file  Intimate Partner Violence: Not on file    Past Surgical History:  Procedure Laterality Date   IR GASTROSTOMY TUBE MOD SED  11/21/2020   IR GASTROSTOMY TUBE REMOVAL  02/27/2021   IR IMAGING GUIDED PORT INSERTION  11/21/2020   IRRIGATION   AND DEBRIDEMENT ABSCESS N/A 09/22/2018   Procedure: IRRIGATION AND DEBRIDEMENT ABSCESS;  Surgeon: Ramirez, Armando, MD;  Location: [facility];  Service: General;  Laterality: N/A;    Family History  Problem Relation Age of Onset   Heart attack Father    Emphysema Paternal Grandmother    Arthritis Mother     No Known Allergies  Current Outpatient Medications on File Prior to Visit  Medication Sig Dispense Refill   blood glucose meter kit and supplies KIT Dispense based on patient and insurance preference. Use up to four times daily as directed. (FOR ICD-9 250.00, 250.01). 1 each 0   glucose blood test strip Use three times daily as instructed 100 each 3   lidocaine (XYLOCAINE) 2 % solution mix 1 part lidocaine WITH 1 part WATER, SWISH AND swallow 10ml OF mixture 30  MINUTES BEFORE MEALS AND AT BEDTIME UP TO 4 TIMES DAILY 200 mL 4   magnesium oxide (MAG-OX) 400 (241.3 Mg) MG tablet Take 1 tablet (400 mg total) by mouth 2 (two) times daily. 20 tablet 0   omeprazole (PRILOSEC) 20 MG capsule Take 20 mg by mouth daily.     oxymetazoline (AFRIN) 0.05 % nasal spray Place 1-2 sprays into both nostrils as needed for congestion.      tadalafil (CIALIS) 5 MG tablet Take 1 tablet (5 mg total) by mouth daily as needed for erectile dysfunction. 90 tablet 0   No current facility-administered medications on file prior to visit.    BP 108/62   Pulse 97   Temp 98.3 F (36.8 C) (Temporal)   Ht 6' (1.829 m)   Wt 204 lb (92.5 kg)   SpO2 97%   BMI 27.67 kg/m  Objective:   Physical Exam Cardiovascular:     Rate and Rhythm: Normal rate and regular rhythm.  Pulmonary:     Effort: Pulmonary effort is normal.     Breath sounds: Normal breath sounds. No wheezing or rales.  Musculoskeletal:     Cervical back: Neck supple.     Right hip: Decreased range of motion. Normal strength.     Comments: Slight decrease in ROM with pain to right hip with flexion and external rotation in supine position.   Skin:    General: Skin is warm and dry.  Neurological:     Mental Status: He is alert and oriented to person, place, and time.          Assessment & Plan:      This visit occurred during the SARS-CoV-2 public health emergency.  Safety protocols were in place, including screening questions prior to the visit, additional usage of staff PPE, and extensive cleaning of exam room while observing appropriate contact time as indicated for disinfecting solutions.   

## 2021-05-15 NOTE — Assessment & Plan Note (Signed)
Breakthrough symptoms despite use of daily omeprazole 20 mg. Add famotidine 20 mg to start, increase to 40 mg if no improvement.   He will update.

## 2021-05-16 ENCOUNTER — Other Ambulatory Visit: Payer: Self-pay | Admitting: Hematology and Oncology

## 2021-05-16 DIAGNOSIS — C090 Malignant neoplasm of tonsillar fossa: Secondary | ICD-10-CM

## 2021-05-18 ENCOUNTER — Ambulatory Visit
Admission: RE | Admit: 2021-05-18 | Discharge: 2021-05-18 | Disposition: A | Discharge status: Home / Self Care | Payer: BC Managed Care – PPO | Source: Ambulatory Visit | Attending: Primary Care | Admitting: Primary Care

## 2021-05-18 ENCOUNTER — Encounter (HOSPITAL_COMMUNITY): Payer: Self-pay | Admitting: Hematology and Oncology

## 2021-05-18 ENCOUNTER — Other Ambulatory Visit: Payer: Self-pay

## 2021-05-18 ENCOUNTER — Encounter: Payer: Self-pay | Admitting: Hematology and Oncology

## 2021-05-18 DIAGNOSIS — M25551 Pain in right hip: Secondary | ICD-10-CM

## 2021-05-18 DIAGNOSIS — G8929 Other chronic pain: Secondary | ICD-10-CM

## 2021-05-18 DIAGNOSIS — M1611 Unilateral primary osteoarthritis, right hip: Secondary | ICD-10-CM | POA: Diagnosis not present

## 2021-05-19 DIAGNOSIS — M25551 Pain in right hip: Secondary | ICD-10-CM

## 2021-05-19 DIAGNOSIS — G8929 Other chronic pain: Secondary | ICD-10-CM

## 2021-05-21 DIAGNOSIS — F418 Other specified anxiety disorders: Secondary | ICD-10-CM | POA: Diagnosis not present

## 2021-05-21 MED ORDER — MELOXICAM 15 MG PO TABS: 15.0000 mg | ORAL_TABLET | Freq: Every day | ORAL | 0 refills | Status: DC | PRN

## 2021-05-28 ENCOUNTER — Encounter: Payer: Self-pay | Admitting: Hematology and Oncology

## 2021-05-28 ENCOUNTER — Encounter: Payer: Self-pay | Admitting: Rehabilitation

## 2021-05-29 ENCOUNTER — Emergency Department (HOSPITAL_BASED_OUTPATIENT_CLINIC_OR_DEPARTMENT_OTHER): Payer: BC Managed Care – PPO

## 2021-05-29 ENCOUNTER — Other Ambulatory Visit: Payer: Self-pay

## 2021-05-29 ENCOUNTER — Encounter (HOSPITAL_BASED_OUTPATIENT_CLINIC_OR_DEPARTMENT_OTHER): Payer: Self-pay | Admitting: *Deleted

## 2021-05-29 ENCOUNTER — Emergency Department (HOSPITAL_BASED_OUTPATIENT_CLINIC_OR_DEPARTMENT_OTHER)
Admission: EM | Admit: 2021-05-29 | Discharge: 2021-05-29 | Disposition: A | Discharge status: Home / Self Care | Payer: BC Managed Care – PPO | Attending: Emergency Medicine | Admitting: Emergency Medicine

## 2021-05-29 DIAGNOSIS — C329 Malignant neoplasm of larynx, unspecified: Secondary | ICD-10-CM | POA: Diagnosis not present

## 2021-05-29 DIAGNOSIS — R6884 Jaw pain: Secondary | ICD-10-CM | POA: Insufficient documentation

## 2021-05-29 DIAGNOSIS — M542 Cervicalgia: Secondary | ICD-10-CM | POA: Insufficient documentation

## 2021-05-29 DIAGNOSIS — Z8521 Personal history of malignant neoplasm of larynx: Secondary | ICD-10-CM | POA: Insufficient documentation

## 2021-05-29 DIAGNOSIS — E119 Type 2 diabetes mellitus without complications: Secondary | ICD-10-CM | POA: Insufficient documentation

## 2021-05-29 DIAGNOSIS — Z87891 Personal history of nicotine dependence: Secondary | ICD-10-CM | POA: Insufficient documentation

## 2021-05-29 HISTORY — DX: Malignant neoplasm of pharynx, unspecified: C14.0

## 2021-05-29 LAB — CBC WITH DIFFERENTIAL/PLATELET
Abs Immature Granulocytes: 0.03 10*3/uL (ref 0.00–0.07)
Basophils Absolute: 0.1 10*3/uL (ref 0.0–0.1)
Basophils Relative: 1 %
Eosinophils Absolute: 0.1 10*3/uL (ref 0.0–0.5)
Eosinophils Relative: 1 %
HCT: 45.1 % (ref 39.0–52.0)
Hemoglobin: 15.1 g/dL (ref 13.0–17.0)
Immature Granulocytes: 0 %
Lymphocytes Relative: 16 %
Lymphs Abs: 1.1 10*3/uL (ref 0.7–4.0)
MCH: 32.1 pg (ref 26.0–34.0)
MCHC: 33.5 g/dL (ref 30.0–36.0)
MCV: 96 fL (ref 80.0–100.0)
Monocytes Absolute: 0.7 10*3/uL (ref 0.1–1.0)
Monocytes Relative: 10 %
Neutro Abs: 4.8 10*3/uL (ref 1.7–7.7)
Neutrophils Relative %: 72 %
Platelets: 216 10*3/uL (ref 150–400)
RBC: 4.7 MIL/uL (ref 4.22–5.81)
RDW: 13.1 % (ref 11.5–15.5)
WBC: 6.7 10*3/uL (ref 4.0–10.5)
nRBC: 0 % (ref 0.0–0.2)

## 2021-05-29 LAB — COMPREHENSIVE METABOLIC PANEL
ALT: 23 U/L (ref 0–44)
AST: 20 U/L (ref 15–41)
Albumin: 3.5 g/dL (ref 3.5–5.0)
Alkaline Phosphatase: 26 U/L — ABNORMAL LOW (ref 38–126)
Anion gap: 7 (ref 5–15)
BUN: 16 mg/dL (ref 6–20)
CO2: 25 mmol/L (ref 22–32)
Calcium: 8.4 mg/dL — ABNORMAL LOW (ref 8.9–10.3)
Chloride: 103 mmol/L (ref 98–111)
Creatinine, Ser: 1.4 mg/dL — ABNORMAL HIGH (ref 0.61–1.24)
GFR, Estimated: >60 mL/min (ref >60)
Glucose, Bld: 159 mg/dL — ABNORMAL HIGH (ref 70–99)
Potassium: 3.9 mmol/L (ref 3.5–5.1)
Sodium: 135 mmol/L (ref 135–145)
Total Bilirubin: 0.6 mg/dL (ref 0.3–1.2)
Total Protein: 5.8 g/dL — ABNORMAL LOW (ref 6.5–8.1)

## 2021-05-29 MED ORDER — IOHEXOL 350 MG/ML SOLN
75.0000 mL | Freq: Once | INTRAVENOUS | Status: AC | PRN
  Administered 2021-05-29: 75 mL via INTRAVENOUS

## 2021-05-29 MED ORDER — CYCLOBENZAPRINE HCL 10 MG PO TABS: 10.0000 mg | ORAL_TABLET | Freq: Two times a day (BID) | ORAL | 0 refills | Status: DC | PRN

## 2021-05-29 NOTE — ED Triage Notes (Signed)
Pt has had throat cancer, finished chemo Feb 21st. Now has tenderness and swelling along prot line with jaw pain.

## 2021-05-29 NOTE — Discharge Instructions (Addendum)
Take tylenol or ibuprofen as well for pain.  CAT scan today showed no signs of clot or infection.  Follow-up with your doctor on Monday or Tuesday if you continue to have symptoms.  You start running a fever start having worsening swelling, redness or any trouble breathing or swallowing return to the emergency room.

## 2021-05-30 NOTE — ED Provider Notes (Signed)
[facility] EMERGENCY DEPT Provider Note   CSN: [id number] Arrival date & time: 05/29/21  1759     History Chief Complaint  Patient presents with   Jaw Pain   Neck Pain    Justin Baldwin is a 45 y.o. male.  Patient is a 45 year old male with a history of diabetes, prior throat cancer with lymphedema and completion of chemotherapy and radiation in February who is presenting today with several days of gradually worsening right-sided neck and jaw pain.  Initially started with the pain in the TMJ area but it was not made worse by opening or closing the mouth and no pain of the teeth.  However in the last 2 days it has moved down into the right anterior neck.  It is mildly tender to palpation and he has felt like there is some minimal swelling.  He has not had a fever, shortness of breath.  He has not noticed any facial swelling.  No shortness of breath or trouble swallowing.  Patient does have a Port-A-Cath on that side but has not been used in a while.  There is some mild tenderness over the tubing.  He has not noticed any redness or drainage.  No lesions present.  He wears a compressive head wrap for lymphedema but cannot recall poor positioning or recent injury  The history is provided by the patient.  Neck Pain     Past Medical History:  Diagnosis Date   Diabetes mellitus without complication ([place])    Erectile dysfunction    Insomnia    Skin abnormality    hidradenitis suppurativa    Throat cancer [facility])     Patient Active Problem List   Diagnosis Date Noted   Chronic pain of right hip 05/15/2021   Lymphedema 02/06/2021   Drug induced constipation 01/13/2021   Mucositis due to antineoplastic therapy 01/12/2021   Weight loss, unintentional 01/12/2021   Port-A-Cath in place 12/04/2020   Cancer of tonsillar fossa ([place]) 10/01/2020   Type 2 diabetes mellitus ([place]) 09/03/2019   Hyperglycemia 09/03/2019   Gastroesophageal reflux disease 03/06/2019   Atherosclerosis of  aorta ([place]) 11/30/2018   Dizziness 11/30/2018   Medication monitoring encounter 10/31/2018   Atypical mycobacterium infection    Abscess of buttock, left 09/22/2018   Hydradenitis 09/22/2018   Kidney insufficiency 09/22/2018   Lump 08/31/2018   Deep tissue injury 08/25/2018   Skin rash 07/25/2018   Fatigue 11/08/2017   Vertigo 07/11/2017   Preventative health care 03/29/2016   Insomnia 03/15/2016   Erectile dysfunction 03/15/2016    Past Surgical History:  Procedure Laterality Date   IR GASTROSTOMY TUBE MOD SED  11/21/2020   IR GASTROSTOMY TUBE REMOVAL  02/27/2021   IR IMAGING GUIDED PORT INSERTION  11/21/2020   IRRIGATION AND DEBRIDEMENT ABSCESS N/A 09/22/2018   Procedure: IRRIGATION AND DEBRIDEMENT ABSCESS;  Surgeon: Ralene Ok, MD;  Location: [facility];  Service: General;  Laterality: N/A;       Family History  Problem Relation Age of Onset   Heart attack Father    Emphysema Paternal Grandmother    Arthritis Mother     Social History   Tobacco Use   Smoking status: Former    Packs/day: 1.00    Types: Cigarettes    Quit date: 10/27/2020    Years since quitting: 0.5   Smokeless tobacco: Former    Quit date: 02/14/2016  Vaping Use   Vaping Use: Never used  Substance Use Topics   Alcohol use: Yes  Alcohol/week: 5.0 standard drinks    Types: 5 Shots of liquor per week    Comment: occasinal    Drug use: No    Home Medications Prior to Admission medications   Medication Sig Start Date End Date Taking? Authorizing Provider  cyclobenzaprine (FLEXERIL) 10 MG tablet Take 1 tablet (10 mg total) by mouth 2 (two) times daily as needed for muscle spasms. 05/29/21  Yes Blanchie Dessert, MD  blood glucose meter kit and supplies KIT Dispense based on patient and insurance preference. Use up to four times daily as directed. (FOR ICD-9 250.00, 250.01). 12/10/19   Pleas [name], NP  famotidine (PEPCID) 20 MG tablet Take 1 tablet (20 mg total) by mouth daily. For heartburn  05/15/21   Pleas [name], NP  glucose blood test strip Use three times daily as instructed 09/23/19   Pleas [name], NP  lidocaine (XYLOCAINE) 2 % solution mix 1 part lidocaine WITH 1 part WATER, SWISH AND swallow 24m OF mixture 30 MINUTES BEFORE MEALS AND AT BEDTIME UP TO 4 TIMES DAILY 02/16/21   IBenay Pike MD  magnesium oxide (MAG-OX) 400 (241.3 Mg) MG tablet Take 1 tablet (400 mg total) by mouth 2 (two) times daily. 01/13/21   IBenay Pike MD  meloxicam (MOBIC) 15 MG tablet Take 1 tablet (15 mg total) by mouth daily as needed for pain. 05/21/21   CPleas [name] NP  omeprazole (PRILOSEC) 20 MG capsule Take 20 mg by mouth daily. 03/18/21   [provider]  oxymetazoline (AFRIN) 0.05 % nasal spray Place 1-2 sprays into both nostrils as needed for congestion.     [provider]  tadalafil (CIALIS) 5 MG tablet Take 1 tablet (5 mg total) by mouth daily as needed for erectile dysfunction. 12/10/19   CPleas [name] NP    Allergies    Patient has no known allergies.  Review of Systems   Review of Systems  Musculoskeletal:  Positive for neck pain.  All other systems reviewed and are negative.  Physical Exam Updated Vital Signs BP 132/74 (BP Location: Left Arm)   Pulse 71   Temp 98.5 F (36.9 C) (Oral)   Resp 16   Ht 6' (1.829 m)   Wt 89.8 kg   SpO2 97%   BMI 26.85 kg/m   Physical Exam Vitals and nursing note reviewed.  Constitutional:      General: He is not in acute distress.    Appearance: Normal appearance. He is well-developed.  HENT:     Head: Normocephalic and atraumatic.     Right Ear: Tympanic membrane normal.     Mouth/Throat:     Mouth: Mucous membranes are moist.  Eyes:     Conjunctiva/sclera: Conjunctivae normal.     Pupils: Pupils are equal, round, and reactive to light.  Neck:   Cardiovascular:     Rate and Rhythm: Normal rate and regular rhythm.     Heart sounds: No murmur heard. Pulmonary:     Effort: Pulmonary  effort is normal. No respiratory distress.     Breath sounds: Normal breath sounds. No wheezing or rales.  Musculoskeletal:        General: No tenderness. Normal range of motion.     Cervical back: Normal range of motion and neck supple.  Skin:    General: Skin is warm and dry.     Capillary Refill: Capillary refill takes less than 2 seconds.     Findings: No erythema or rash.  Neurological:  Mental Status: He is alert and oriented to person, place, and time. Mental status is at baseline.  Psychiatric:        Mood and Affect: Mood normal.        Behavior: Behavior normal.    ED Results / Procedures / Treatments   Labs (all labs ordered are listed, but only abnormal results are displayed) Labs Reviewed  COMPREHENSIVE METABOLIC PANEL - Abnormal; Notable for the following components:      Result Value   Glucose, Bld 159 (*)    Creatinine, Ser 1.40 (*)    Calcium 8.4 (*)    Total Protein 5.8 (*)    Alkaline Phosphatase 26 (*)    All other components within normal limits  CBC WITH DIFFERENTIAL/PLATELET    EKG None  Radiology CT Angio Neck W and/or Wo Contrast  Result Date: 05/29/2021 CLINICAL DATA:  Throat cancer status post chemotherapy. Tenderness and swelling along the jaw. EXAM: CT ANGIOGRAPHY NECK TECHNIQUE: Multidetector CT imaging of the neck was performed using the standard protocol during bolus administration of intravenous contrast. Multiplanar CT image reconstructions and MIPs were obtained to evaluate the vascular anatomy. Carotid stenosis measurements (when applicable) are obtained utilizing NASCET criteria, using the distal internal carotid diameter as the denominator. CONTRAST:  41m OMNIPAQUE IOHEXOL 350 MG/ML SOLN COMPARISON:  09/30/2020 neck CT 04/10/2021 head CT FINDINGS: Aortic arch: Standard branching. Imaged portion shows no evidence of aneurysm or dissection. No significant stenosis of the major arch vessel origins. Right carotid system: No evidence of  dissection, stenosis (50% or greater) or occlusion. Left carotid system: No evidence of dissection, stenosis (50% or greater) or occlusion. Vertebral arteries: Codominant. No evidence of dissection, stenosis (50% or greater) or occlusion. Skeleton: Unremarkable Other neck: Left tonsillar mass has decreased in no fluid collection. Size and there is no residual enhancement. Mild left lower facial subcutaneous induration with thickening of the platysma. Upper chest: Incidentally noted azygos fissure. IMPRESSION: 1. No residual enhancement at the site of left peritonsillar mass. Correlate with direct visualization. 2. Mild left lower facial subcutaneous induration with thickening of the platysma, similar to prior. No fluid collection. 3. No arterial abnormality of the neck. Electronically Signed   By: KUlyses JarredM.D.   On: 05/29/2021 22:13    Procedures Procedures   Medications Ordered in ED Medications  iohexol (OMNIPAQUE) 350 MG/ML injection 75 mL (75 mLs Intravenous Contrast Given 05/29/21 2118)    ED Course  I have reviewed the triage vital signs and the nursing notes.  Pertinent labs & imaging results that were available during my care of the patient were reviewed by me and considered in my medical decision making (see chart for details).    MDM Rules/Calculators/A&P                           Patient is a 45year old male presenting today with right anterior neck pain that has been present for a few days.  No known trauma.  On exam there is no findings of acute infection but tenderness is around where the Port-A-Cath is.  Concern for possible clot of the Port-A-Cath versus recurrent cancer.  No evidence of dental infection at this time.  Patient denies any respiratory symptoms.  CMP and CBC are reassuring.  No elevation of white count.  CT angio of neck to evaluate for Port-A-Cath obstruction shows no acute evidence for arterial abnormality.  No evidence of DVT.  Patient has no right  upper  extremity swelling or pain.  He does have some mild left lower facial subcutaneous induration with thickening of the platysma which is chronic and no residual enhancement at the site of the left peritonsillar mass.  At this time no symptoms to suggest cellulitis.  Possible strain or inflammation.  Encourage patient to do Tylenol, ibuprofen and he was given muscle relaxer.  Encouraged follow-up with his PCP on Monday or Tuesday if symptoms have not resolved.  Patient given return precautions.  MDM   Amount and/or Complexity of Data Reviewed Clinical lab tests: ordered and reviewed Tests in the radiology section of CPT: ordered and reviewed Independent visualization of images, tracings, or specimens: yes    Final Clinical Impression(s) / ED Diagnoses Final diagnoses:  Anterior neck pain    Rx / DC Orders ED Discharge Orders          Ordered    cyclobenzaprine (FLEXERIL) 10 MG tablet  2 times daily PRN        05/29/21 2314             Blanchie Dessert, MD 05/30/21 0018

## 2021-06-03 ENCOUNTER — Other Ambulatory Visit: Payer: Self-pay | Admitting: Internal Medicine

## 2021-06-05 ENCOUNTER — Ambulatory Visit (HOSPITAL_COMMUNITY)
Admission: RE | Admit: 2021-06-05 | Discharge: 2021-06-05 | Disposition: A | Discharge status: Home / Self Care | Payer: BC Managed Care – PPO | Source: Ambulatory Visit | Attending: Hematology and Oncology | Admitting: Hematology and Oncology

## 2021-06-05 ENCOUNTER — Other Ambulatory Visit (HOSPITAL_COMMUNITY): Payer: BC Managed Care – PPO | Admitting: Dentistry

## 2021-06-05 ENCOUNTER — Encounter (HOSPITAL_COMMUNITY): Payer: Self-pay

## 2021-06-05 ENCOUNTER — Ambulatory Visit (HOSPITAL_COMMUNITY)
Admission: RE | Admit: 2021-06-05 | Discharge: 2021-06-05 | Disposition: A | Discharge status: Home / Self Care | Payer: BC Managed Care – PPO | Source: Ambulatory Visit | Attending: Dentistry | Admitting: Dentistry

## 2021-06-05 ENCOUNTER — Other Ambulatory Visit: Payer: Self-pay

## 2021-06-05 DIAGNOSIS — C090 Malignant neoplasm of tonsillar fossa: Secondary | ICD-10-CM

## 2021-06-05 DIAGNOSIS — Z9221 Personal history of antineoplastic chemotherapy: Secondary | ICD-10-CM | POA: Insufficient documentation

## 2021-06-05 DIAGNOSIS — Z452 Encounter for adjustment and management of vascular access device: Secondary | ICD-10-CM | POA: Diagnosis not present

## 2021-06-05 HISTORY — PX: IR REMOVAL TUN ACCESS W/ PORT W/O FL MOD SED: IMG2290

## 2021-06-05 LAB — GLUCOSE, CAPILLARY: Glucose-Capillary: 104 mg/dL — ABNORMAL HIGH (ref 70–99)

## 2021-06-05 MED ORDER — FENTANYL CITRATE (PF) 100 MCG/2ML IJ SOLN
INTRAMUSCULAR | Status: AC | PRN
  Administered 2021-06-05 (×2): 50 ug via INTRAVENOUS

## 2021-06-05 MED ORDER — FENTANYL CITRATE (PF) 100 MCG/2ML IJ SOLN
INTRAMUSCULAR | Status: AC
  Filled 2021-06-05: qty 2

## 2021-06-05 MED ORDER — MIDAZOLAM HCL 2 MG/2ML IJ SOLN
INTRAMUSCULAR | Status: AC
  Filled 2021-06-05: qty 4

## 2021-06-05 MED ORDER — LIDOCAINE-EPINEPHRINE 1 %-1:100000 IJ SOLN
INTRAMUSCULAR | Status: AC | PRN
  Administered 2021-06-05: 10 mL via INTRADERMAL

## 2021-06-05 MED ORDER — LIDOCAINE-EPINEPHRINE 1 %-1:100000 IJ SOLN
INTRAMUSCULAR | Status: AC
  Filled 2021-06-05: qty 1

## 2021-06-05 MED ORDER — SODIUM CHLORIDE 0.9 % IV SOLN: INTRAVENOUS | Status: DC

## 2021-06-05 MED ORDER — MIDAZOLAM HCL 2 MG/2ML IJ SOLN
INTRAMUSCULAR | Status: AC | PRN
  Administered 2021-06-05 (×4): 1 mg via INTRAVENOUS

## 2021-06-05 NOTE — Procedures (Addendum)
Interventional Radiology Procedure:   Indications: History of squamous cell carcinoma of the tonsillar fossa and completed therapy.  Port is no longer needed.  Procedure: Port removal.  Findings: Complete removal of right chest port  Complications: None     EBL: Minimal, less than 10 ml  Plan: Discharge in one hour.  Keep port site and incisions dry for at least 24 hours.     Balraj Brayfield R. Anselm Pancoast, MD  Pager: [phone]

## 2021-06-05 NOTE — Discharge Instructions (Signed)
Please call Interventional Radiology clinic [phone] with any questions or concerns.  You may remove your dressing and shower tomorrow.   Implanted Port Removal, Care After This sheet gives you information about how to care for yourself after your procedure. Your health care provider may also give you more specific instructions. If you have problems or questions, contact your health care provider. What can I expect after the procedure? After the procedure, it is common to have: Soreness or pain near your incision. Some swelling or bruising near your incision. Follow these instructions at home: Medicines Take over-the-counter and prescription medicines only as told by your health care provider. If you were prescribed an antibiotic medicine, take it as told by your health care provider. Do not stop taking the antibiotic even if you start to feel better. Bathing Do not take baths, swim, or use a hot tub until your health care provider approves. Ask your health care provider if you can take showers. You may only be allowed to take sponge baths. Incision care Follow instructions from your health care provider about how to take care of your incision. Make sure you: Wash your hands with soap and water before you change your bandage (dressing). If soap and water are not available, use hand sanitizer. Change your dressing as told by your health care provider. Keep your dressing dry. Leave stitches (sutures), skin glue, or adhesive strips in place. These skin closures may need to stay in place for 2 weeks or longer. If adhesive strip edges start to loosen and curl up, you may trim the loose edges. Do not remove adhesive strips completely unless your health care provider tells you to do that. Check your incision area every day for signs of infection. Check for: More redness, swelling, or pain. More fluid or blood. Warmth. Pus or a bad smell.    Driving Do not drive for 24 hours if you were  given a medicine to help you relax (sedative) during your procedure. If you did not receive a sedative, ask your health care provider when it is safe to drive.    Activity Return to your normal activities as told by your health care provider. Ask your health care provider what activities are safe for you. Do not lift anything that is heavier than 10 lb (4.5 kg), or the limit that you are told, until your health care provider says that it is safe. Do not do activities that involve lifting your arms over your head. General instructions Do not use any products that contain nicotine or tobacco, such as cigarettes and e-cigarettes. These can delay healing. If you need help quitting, ask your health care provider. Keep all follow-up visits as told by your health care provider. This is important. Contact a health care provider if: You have more redness, swelling, or pain around your incision. You have more fluid or blood coming from your incision. Your incision feels warm to the touch. You have pus or a bad smell coming from your incision. You have pain that is not relieved by your pain medicine. Get help right away if you have: A fever or chills. Chest pain. Difficulty breathing. Summary After the procedure, it is common to have pain, soreness, swelling, or bruising near your incision. If you were prescribed an antibiotic medicine, take it as told by your health care provider. Do not stop taking the antibiotic even if you start to feel better. Do not drive for 24 hours if you were given a sedative during   your procedure. Return to your normal activities as told by your health care provider. Ask your health care provider what activities are safe for you. This information is not intended to replace advice given to you by your health care provider. Make sure you discuss any questions you have with your health care provider. Document Revised: 11/24/2017 Document Reviewed: 11/24/2017 Elsevier Patient  Education  2021 [employer] Inc[employer]   Moderate Conscious Sedation, Adult, Care After This sheet gives you information about how to care for yourself after your procedure. Your health care provider may also give you more specific instructions. If you have problems or questions, contact your health care provider. What can I expect after the procedure? After the procedure, it is common to have: Sleepiness for several hours. Impaired judgment for several hours. Difficulty with balance. Vomiting if you eat too soon. Follow these instructions at home: For the time period you were told by your health care provider: Rest. Do not participate in activities where you could fall or become injured. Do not drive or use machinery. Do not drink alcohol. Do not take sleeping pills or medicines that cause drowsiness. Do not make important decisions or sign legal documents. Do not take care of children on your own.        Eating and drinking Follow the diet recommended by your health care provider. Drink enough fluid to keep your urine pale yellow. If you vomit: Drink water, juice, or soup when you can drink without vomiting. Make sure you have little or no nausea before eating solid foods.    General instructions Take over-the-counter and prescription medicines only as told by your health care provider. Have a responsible adult stay with you for the time you are told. It is important to have someone help care for you until you are awake and alert. Do not smoke. Keep all follow-up visits as told by your health care provider. This is important. Contact a health care provider if: You are still sleepy or having trouble with balance after 24 hours. You feel light-headed. You keep feeling nauseous or you keep vomiting. You develop a rash. You have a fever. You have redness or swelling around the IV site. Get help right away if: You have trouble breathing. You have new-onset confusion at  home. Summary After the procedure, it is common to feel sleepy, have impaired judgment, or feel nauseous if you eat too soon. Rest after you get home. Know the things you should not do after the procedure. Follow the diet recommended by your health care provider and drink enough fluid to keep your urine pale yellow. Get help right away if you have trouble breathing or new-onset confusion at home. This information is not intended to replace advice given to you by your health care provider. Make sure you discuss any questions you have with your health care provider. Document Revised: 02/08/2020 Document Reviewed: 09/06/2019 Elsevier Patient Education  2021 Elsevier Inc.   

## 2021-06-05 NOTE — H&P (Signed)
Referring Physician(s): Benay Pike  Supervising Physician: Markus Daft  Patient Status:  [facility]  Chief Complaint:  "I'm getting my port out"  Subjective: Patient familiar to IR service from left buttock subcutaneous fluid collection aspiration in 2019 as well as Port-A-Cath and gastrostomy tube placements on 11/21/2020 and gastrostomy tube removal on 02/27/2021.  He has a history of tonsillar carcinoma and has completed chemotherapy.  He presents today for Port-A-Cath removal.  He currently denies fever, headache, chest pain, dyspnea, cough, abdominal/back pain, nausea, vomiting or bleeding.  Past Medical History:  Diagnosis Date   Diabetes mellitus without complication ([place])    Erectile dysfunction    Insomnia    Skin abnormality    hidradenitis suppurativa    Throat cancer [facility])    Past Surgical History:  Procedure Laterality Date   IR GASTROSTOMY TUBE MOD SED  11/21/2020   IR GASTROSTOMY TUBE REMOVAL  02/27/2021   IR IMAGING GUIDED PORT INSERTION  11/21/2020   IRRIGATION AND DEBRIDEMENT ABSCESS N/A 09/22/2018   Procedure: IRRIGATION AND DEBRIDEMENT ABSCESS;  Surgeon: Ralene Ok, MD;  Location: [facility];  Service: General;  Laterality: N/A;      Allergies: Patient has no known allergies.  Medications: Prior to Admission medications   Medication Sig Start Date End Date Taking? Authorizing Provider  blood glucose meter kit and supplies KIT Dispense based on patient and insurance preference. Use up to four times daily as directed. (FOR ICD-9 250.00, 250.01). 12/10/19  Yes Pleas [name], NP  cyclobenzaprine (FLEXERIL) 10 MG tablet Take 1 tablet (10 mg total) by mouth 2 (two) times daily as needed for muscle spasms. 05/29/21  Yes Blanchie Dessert, MD  famotidine (PEPCID) 20 MG tablet Take 1 tablet (20 mg total) by mouth daily. For heartburn 05/15/21  Yes Pleas [name], NP  glucose blood test strip Use three times daily as instructed 09/23/19  Yes Pleas [name], NP  lidocaine (XYLOCAINE) 2 % solution mix 1 part lidocaine WITH 1 part WATER, SWISH AND swallow 26m OF mixture 30 MINUTES BEFORE MEALS AND AT BEDTIME UP TO 4 TIMES DAILY 02/16/21  Yes Iruku, PArletha Pili MD  magnesium oxide (MAG-OX) 400 (241.3 Mg) MG tablet Take 1 tablet (400 mg total) by mouth 2 (two) times daily. 01/13/21  Yes IBenay Pike MD  meloxicam (MOBIC) 15 MG tablet Take 1 tablet (15 mg total) by mouth daily as needed for pain. 05/21/21  Yes CPleas [name] NP  omeprazole (PRILOSEC) 20 MG capsule Take 20 mg by mouth daily. 03/18/21  Yes [provider]  oxymetazoline (AFRIN) 0.05 % nasal spray Place 1-2 sprays into both nostrils as needed for congestion.    Yes [provider]  tadalafil (CIALIS) 5 MG tablet Take 1 tablet (5 mg total) by mouth daily as needed for erectile dysfunction. 12/10/19  Yes CPleas [name] NP     Vital Signs: BP (!) 154/95   Pulse 73   Temp 98.1 F (36.7 C) (Oral)   Resp 18   SpO2 100%   Physical Exam awake, alert.  Chest clear to auscultation bilaterally.  Clean, intact right chest wall Port-A-Cath.  Heart with regular rate and rhythm.  Abdomen soft, positive bowel sounds, nontender.  No lower extremity edema.  Imaging: No results found.  Labs:  CBC: Recent Labs    12/30/20 1457 01/13/21 0913 02/06/21 0857 05/29/21 1937  WBC 3.3* 4.4 4.8 6.7  HGB 13.8 14.6 15.5 15.1  HCT 41.9 43.8 46.9 45.1  PLT  242 177 219 216    COAGS: Recent Labs    11/21/20 1007  INR 1.0    BMP: Recent Labs    12/30/20 1457 01/13/21 0913 02/06/21 0857 05/29/21 1937  NA 138 140 140 135  K 3.9 4.4 3.9 3.9  CL 102 102 99 103  CO2 30 28 30 25  GLUCOSE 125* 136* 122* 159*  BUN 14 15 14 16  CALCIUM 8.6* 8.9 8.5* 8.4*  CREATININE 1.35* 1.22 1.29* 1.40*  GFRNONAA >60 >60 >60 >60    LIVER FUNCTION TESTS: Recent Labs    06/16/20 0914 10/15/20 1132 05/29/21 1937  BILITOT 0.8 0.8 0.6  AST 22 35 20  ALT 23 79* 23  ALKPHOS 28*  43 26*  PROT 7.3 7.9 5.8*  ALBUMIN 4.1 4.1 3.5    Assessment and Plan: Patient familiar to IR service from left buttock subcutaneous fluid collection aspiration in 2019 as well as Port-A-Cath and gastrostomy tube placements on 11/21/2020 and gastrostomy tube removal on 02/27/2021.  He has a history of tonsillar carcinoma and has completed chemotherapy.  He presents today for Port-A-Cath removal.  Details/risks of procedure, including but not limited to, internal bleeding, infection, injury to adjacent structures discussed with patient with his understanding and consent.   Electronically Signed: D. Kevin Allred, PA-C 06/05/2021, 8:25 AM   I spent a total of  20 minutes at the the patient's bedside AND on the patient's hospital floor or unit, greater than 50% of which was counseling/coordinating care for Port-A-Cath removal      

## 2021-06-11 DIAGNOSIS — F418 Other specified anxiety disorders: Secondary | ICD-10-CM | POA: Diagnosis not present

## 2021-06-23 ENCOUNTER — Ambulatory Visit (INDEPENDENT_AMBULATORY_CARE_PROVIDER_SITE_OTHER): Payer: BC Managed Care – PPO | Admitting: Primary Care

## 2021-06-23 ENCOUNTER — Encounter: Payer: Self-pay | Admitting: Primary Care

## 2021-06-23 ENCOUNTER — Other Ambulatory Visit: Payer: Self-pay

## 2021-06-23 VITALS — BP 118/76 | HR 73 | Temp 98.3°F | Ht 72.0 in | Wt 201.0 lb

## 2021-06-23 DIAGNOSIS — E119 Type 2 diabetes mellitus without complications: Secondary | ICD-10-CM

## 2021-06-23 DIAGNOSIS — M25551 Pain in right hip: Secondary | ICD-10-CM

## 2021-06-23 DIAGNOSIS — Z0000 Encounter for general adult medical examination without abnormal findings: Secondary | ICD-10-CM | POA: Diagnosis not present

## 2021-06-23 DIAGNOSIS — K219 Gastro-esophageal reflux disease without esophagitis: Secondary | ICD-10-CM

## 2021-06-23 DIAGNOSIS — E785 Hyperlipidemia, unspecified: Secondary | ICD-10-CM

## 2021-06-23 DIAGNOSIS — N529 Male erectile dysfunction, unspecified: Secondary | ICD-10-CM

## 2021-06-23 DIAGNOSIS — I700 Atherosclerosis of aorta: Secondary | ICD-10-CM | POA: Diagnosis not present

## 2021-06-23 DIAGNOSIS — G8929 Other chronic pain: Secondary | ICD-10-CM

## 2021-06-23 LAB — LIPID PANEL
Cholesterol: 300 mg/dL — ABNORMAL HIGH (ref 0–200)
HDL: 24.2 mg/dL — ABNORMAL LOW (ref >39.00)
LDL Cholesterol: 249 mg/dL — ABNORMAL HIGH (ref 0–99)
NonHDL: 275.53
Total CHOL/HDL Ratio: 12
Triglycerides: 132 mg/dL (ref 0.0–149.0)
VLDL: 26.4 mg/dL (ref 0.0–40.0)

## 2021-06-23 LAB — BASIC METABOLIC PANEL
BUN: 20 mg/dL (ref 6–23)
CO2: 32 mEq/L (ref 19–32)
Calcium: 9.2 mg/dL (ref 8.4–10.5)
Chloride: 98 mEq/L (ref 96–112)
Creatinine, Ser: 1.56 mg/dL — ABNORMAL HIGH (ref 0.40–1.50)
GFR: 53.5 mL/min — ABNORMAL LOW (ref >60.00)
Glucose, Bld: 97 mg/dL (ref 70–99)
Potassium: 5.2 mEq/L — ABNORMAL HIGH (ref 3.5–5.1)
Sodium: 134 mEq/L — ABNORMAL LOW (ref 135–145)

## 2021-06-23 LAB — MICROALBUMIN / CREATININE URINE RATIO
Creatinine,U: 209.7 mg/dL
Microalb Creat Ratio: 0.3 mg/g (ref 0.0–30.0)
Microalb, Ur: <0.7 mg/dL (ref 0.0–1.9)

## 2021-06-23 LAB — HEMOGLOBIN A1C: Hgb A1c MFr Bld: 5.6 % (ref 4.6–6.5)

## 2021-06-23 NOTE — Progress Notes (Signed)
Subjective:    Patient ID: Justin Baldwin, male    DOB: [birth year], 45 y.o.   MRN: [id number]  HPI  Justin Baldwin is a very pleasant 45 y.o. male who presents today for complete physical and follow up of chronic conditions.  Immunizations: -Tetanus: Unsure, believes UTD. -Influenza: Declines  -Covid-19: Declines   Diet: Fair diet.  Exercise: Regular exercise.  Eye exam: Due  Dental exam: Completes semi-annually  BP Readings from Last 3 Encounters:  06/23/21 118/76  06/05/21 (!) 144/98  05/29/21 132/74        Review of Systems  Constitutional:  Negative for unexpected weight change.  HENT:  Negative for rhinorrhea.   Eyes:  Negative for visual disturbance.  Respiratory:  Negative for cough and shortness of breath.   Cardiovascular:  Negative for chest pain.  Gastrointestinal:  Negative for constipation and diarrhea.  Genitourinary:  Negative for difficulty urinating.  Musculoskeletal:  Positive for arthralgias.       Chronic hip pain  Skin:  Negative for rash.  Allergic/Immunologic: Negative for environmental allergies.  Neurological:  Negative for dizziness and headaches.  Psychiatric/Behavioral:  The patient is not nervous/anxious.         Past Medical History:  Diagnosis Date   Diabetes mellitus without complication ([facility])    Erectile dysfunction    Insomnia    Skin abnormality    hidradenitis suppurativa    Throat cancer ([place])     Social History   Socioeconomic History   Marital status: Married    Spouse name: Not on file   Number of children: Not on file   Years of education: Not on file   Highest education level: Not on file  Occupational History   Not on file  Tobacco Use   Smoking status: Former    Packs/day: 1.00    Types: Cigarettes    Quit date: 10/27/2020    Years since quitting: 0.6   Smokeless tobacco: Former    Quit date: 02/14/2016  Vaping Use   Vaping Use: Never used  Substance and Sexual Activity   Alcohol use: Yes     Alcohol/week: 5.0 standard drinks    Types: 5 Shots of liquor per week    Comment: occasinal    Drug use: No   Sexual activity: Yes  Other Topics Concern   Not on file  Social History Narrative   ** Merged History Encounter **       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    Social Determinants of Health   Financial Resource Strain: Low Risk    Difficulty of Paying Living Expenses: Not hard at all  Food Insecurity: No Food Insecurity   Worried About Charity fundraiser in the Last Year: Never true   [place] in the Last Year: Never true  Transportation Needs: No Transportation Needs   Lack of Transportation (Medical): No   Lack of Transportation (Non-Medical): No  Physical Activity: Not on file  Stress: Not on file  Social Connections: Not on file  Intimate Partner Violence: Not on file    Past Surgical History:  Procedure Laterality Date   IR GASTROSTOMY TUBE MOD SED  11/21/2020   IR GASTROSTOMY TUBE REMOVAL  02/27/2021   IR IMAGING GUIDED PORT INSERTION  11/21/2020   IR REMOVAL TUN ACCESS W/ PORT W/O FL MOD SED  06/05/2021   IRRIGATION AND DEBRIDEMENT ABSCESS N/A 09/22/2018   Procedure: IRRIGATION  AND DEBRIDEMENT ABSCESS;  Surgeon: Ralene Ok, MD;  Location[facility];  Service: General;  Laterality: N/A;    Family History  Problem Relation Age of Onset   Heart attack Father    Emphysema Paternal Grandmother    Arthritis Mother     No Known Allergies  Current Outpatient Medications on File Prior to Visit  Medication Sig Dispense Refill   blood glucose meter kit and supplies KIT Dispense based on patient and insurance preference. Use up to four times daily as directed. (FOR ICD-9 250.00, 250.01). 1 each 0   famotidine (PEPCID) 20 MG tablet Take 1 tablet (20 mg total) by mouth daily. For heartburn 90 tablet 0   glucose blood test strip Use three times daily as instructed 100 each 3   magnesium oxide (MAG-OX) 400 (241.3  Mg) MG tablet Take 1 tablet (400 mg total) by mouth 2 (two) times daily. 20 tablet 0   meloxicam (MOBIC) 15 MG tablet Take 1 tablet (15 mg total) by mouth daily as needed for pain. 30 tablet 0   omeprazole (PRILOSEC) 20 MG capsule Take 20 mg by mouth daily.     oxymetazoline (AFRIN) 0.05 % nasal spray Place 1-2 sprays into both nostrils as needed for congestion.      tadalafil (CIALIS) 5 MG tablet Take 1 tablet (5 mg total) by mouth daily as needed for erectile dysfunction. 90 tablet 0   cyclobenzaprine (FLEXERIL) 10 MG tablet Take 1 tablet (10 mg total) by mouth 2 (two) times daily as needed for muscle spasms. (Patient not taking: Reported on 06/23/2021) 20 tablet 0   lidocaine (XYLOCAINE) 2 % solution mix 1 part lidocaine WITH 1 part WATER, SWISH AND swallow 65m OF mixture 30 MINUTES BEFORE MEALS AND AT BEDTIME UP TO 4 TIMES DAILY (Patient not taking: Reported on 06/23/2021) 200 mL 4   No current facility-administered medications on file prior to visit.    BP 118/76   Pulse 73   Temp 98.3 F (36.8 C) (Temporal)   Ht 6' (1.829 m)   Wt 201 lb (91.2 kg)   SpO2 97%   BMI 27.26 kg/m  Objective:   Physical Exam HENT:     Right Ear: Tympanic membrane and ear canal normal.     Left Ear: Tympanic membrane and ear canal normal.     Nose: Nose normal.     Right Sinus: No maxillary sinus tenderness or frontal sinus tenderness.     Left Sinus: No maxillary sinus tenderness or frontal sinus tenderness.  Eyes:     Conjunctiva/sclera: Conjunctivae normal.  Neck:     Thyroid: No thyromegaly.     Vascular: No carotid bruit.  Cardiovascular:     Rate and Rhythm: Normal rate and regular rhythm.     Heart sounds: Normal heart sounds.  Pulmonary:     Effort: Pulmonary effort is normal.     Breath sounds: Normal breath sounds. No wheezing or rales.  Abdominal:     General: Bowel sounds are normal.     Palpations: Abdomen is soft.     Tenderness: There is no abdominal tenderness.   Musculoskeletal:        General: Normal range of motion.     Cervical back: Neck supple.  Skin:    General: Skin is warm and dry.  Neurological:     Mental Status: He is alert and oriented to person, place, and time.     Cranial Nerves: No cranial nerve deficit.  Deep Tendon Reflexes: Reflexes are normal and symmetric.  Psychiatric:        Mood and Affect: Mood normal.          Assessment & Plan:      This visit occurred during the SARS-CoV-2 public health emergency.  Safety protocols were in place, including screening questions prior to the visit, additional usage of staff PPE, and extensive cleaning of exam room while observing appropriate contact time as indicated for disinfecting solutions.

## 2021-06-23 NOTE — Assessment & Plan Note (Signed)
Off treatment. Repeat A1C pending.  Commended him on weight loss from diet and exercise. Will likely resume statin therapy.  Urine microalbumin due and pending.  Follow up in 6 months.

## 2021-06-23 NOTE — Patient Instructions (Addendum)
Stop by the lab prior to leaving today. I will notify you of your results once received.   Call your insurance to see if they will cover a colonoscopy. You are due at age 45.  It was a pleasure to see you today!  Preventive Care 55-29 Years Old, Male Preventive care refers to lifestyle choices and visits with your health care provider that can promote health and wellness. This includes: A yearly physical exam. This is also called an annual wellness visit. Regular dental and eye exams. Immunizations. Screening for certain conditions. Healthy lifestyle choices, such as: Eating a healthy diet. Getting regular exercise. Not using drugs or products that contain nicotine and tobacco. Limiting alcohol use. What can I expect for my preventive care visit? Physical exam Your health care provider will check your: Height and weight. These may be used to calculate your BMI (body mass index). BMI is a measurement that tells if you are at a healthy weight. Heart rate and blood pressure. Body temperature. Skin for abnormal spots. Counseling Your health care provider may ask you questions about your: Past medical problems. Family's medical history. Alcohol, tobacco, and drug use. Emotional well-being. Home life and relationship well-being. Sexual activity. Diet, exercise, and sleep habits. Work and work Statistician. Access to firearms. What immunizations do I need?  Vaccines are usually given at various ages, according to a schedule. Your health care provider will recommend vaccines for you based on your age, medicalhistory, and lifestyle or other factors, such as travel or where you work. What tests do I need? Blood tests Lipid and cholesterol levels. These may be checked every 5 years, or more often if you are over 29 years old. Hepatitis C test. Hepatitis B test. Screening Lung cancer screening. You may have this screening every year starting at age 40 if you have a 30-pack-year history  of smoking and currently smoke or have quit within the past 15 years. Prostate cancer screening. Recommendations will vary depending on your family history and other risks. Genital exam to check for testicular cancer or hernias. Colorectal cancer screening. All adults should have this screening starting at age 18 and continuing until age 25. Your health care provider may recommend screening at age 67 if you are at increased risk. You will have tests every 1-10 years, depending on your results and the type of screening test. Diabetes screening. This is done by checking your blood sugar (glucose) after you have not eaten for a while (fasting). You may have this done every 1-3 years. STD (sexually transmitted disease) testing, if you are at risk. Follow these instructions at home: Eating and drinking  Eat a diet that includes fresh fruits and vegetables, whole grains, lean protein, and low-fat dairy products. Take vitamin and mineral supplements as recommended by your health care provider. Do not drink alcohol if your health care provider tells you not to drink. If you drink alcohol: Limit how much you have to 0-2 drinks a day. Be aware of how much alcohol is in your drink. In the [place] one drink equals one 12 oz bottle of beer (355 mL), one 5 oz glass of wine (148 mL), or one 1 oz glass of hard liquor (44 mL).  Lifestyle Take daily care of your teeth and gums. Brush your teeth every morning and night with fluoride toothpaste. Floss one time each day. Stay active. Exercise for at least 30 minutes 5 or more days each week. Do not use any products that contain nicotine or tobacco,  such as cigarettes, e-cigarettes, and chewing tobacco. If you need help quitting, ask your health care provider. Do not use drugs. If you are sexually active, practice safe sex. Use a condom or other form of protection to prevent STIs (sexually transmitted infections). If told by your health care provider, take  low-dose aspirin daily starting at age 69. Find healthy ways to cope with stress, such as: Meditation, yoga, or listening to music. Journaling. Talking to a trusted person. Spending time with friends and family. Safety Always wear your seat belt while driving or riding in a vehicle. Do not drive: If you have been drinking alcohol. Do not ride with someone who has been drinking. When you are tired or distracted. While texting. Wear a helmet and other protective equipment during sports activities. If you have firearms in your house, make sure you follow all gun safety procedures. What's next? Go to your health care provider once a year for an annual wellness visit. Ask your health care provider how often you should have your eyes and teeth checked. Stay up to date on all vaccines. This information is not intended to replace advice given to you by your health care provider. Make sure you discuss any questions you have with your healthcare provider. Document Revised: 07/10/2019 Document Reviewed: 10/05/2018 Elsevier Patient Education  2022 [employer].

## 2021-06-23 NOTE — Assessment & Plan Note (Addendum)
Encouraged to follow up with sports medicine. Using Meloxicam PRN. Discussed to use sparingly, repeat renal function pending.

## 2021-06-23 NOTE — Assessment & Plan Note (Signed)
Doing well on omeprazole 20 mg and famotidine 20 mg daily, continue same.

## 2021-06-23 NOTE — Assessment & Plan Note (Signed)
Doing well on daily Cialis 5 mg per Urology, continue same.

## 2021-06-23 NOTE — Assessment & Plan Note (Signed)
Patient stopped rosuvastatin numerous months ago. Repeat lipid panel pending. Will likely resume. Await results.

## 2021-06-23 NOTE — Assessment & Plan Note (Signed)
Tetanus UTD per patient, he will update when he finds these records. Colonoscopy due in 1 week, he will call insurance to ensure they will cover.  Commended him on exercise and diet.  Exam today stable Labs pending

## 2021-06-24 ENCOUNTER — Other Ambulatory Visit: Payer: Self-pay | Admitting: Primary Care

## 2021-06-24 DIAGNOSIS — I700 Atherosclerosis of aorta: Secondary | ICD-10-CM

## 2021-06-24 DIAGNOSIS — E785 Hyperlipidemia, unspecified: Secondary | ICD-10-CM

## 2021-06-24 MED ORDER — ROSUVASTATIN CALCIUM 40 MG PO TABS: 40.0000 mg | ORAL_TABLET | Freq: Every day | ORAL | 3 refills | Status: DC

## 2021-06-25 DIAGNOSIS — F418 Other specified anxiety disorders: Secondary | ICD-10-CM | POA: Diagnosis not present

## 2021-06-30 NOTE — Progress Notes (Signed)
Justin Mendel T. Justin Bold, MD, [place] at [facility] [place], [address]  Phone: [phone]  FAX: [phone]  Justin Baldwin - 45 y.o. male  MRN [id number]  Date of Birth: 1976/08/15  Date: 07/01/2021  PCP: Pleas [name], NP  Referral: Pleas [name], NP  Chief Complaint  Patient presents with   Hip Pain    Right    This visit occurred during the SARS-CoV-2 public health emergency.  Safety protocols were in place, including screening questions prior to the visit, additional usage of staff PPE, and extensive cleaning of exam room while observing appropriate contact time as indicated for disinfecting solutions.   Subjective:   Justin Baldwin is a 45 y.o. very pleasant male patient with Body mass index is 27.36 kg/m. who presents with the following:  He is a pleasant patient with ongoing treatment for cancer of the tonsillar fossa here for evaluation of chronic hip pain and osteoarthritis.  I reviewed his plain hip x-ray and findings show moderate overall and advanced-for-age global degenerative changes.   He also had a PET scan 04/10/2021 that did not show any hypermetabolic activity associated with bony mets.   He has tried some Mobic.  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   Hurt his hip while boxing.  Also does cross.  More kettlebell swings .  Crossing legs hurts a lot.    Twisting hurts and putting on right shoe.  Severe loss of motion.   He does not have any history of remote trauma to the region, and he denies back pain or radicular pain.  Review of Systems is noted in the HPI, as appropriate   Objective:   BP 128/90   Pulse 71   Temp 98.3 F (36.8 C) (Temporal)   Ht 6' (1.829 m)   Wt 201 lb 12 oz (91.5 kg)   SpO2 99%   BMI 27.36 kg/m    HIP EXAM: SIDE: R ROM: Abduction, Flexion, Internal and External range of motion: Does have mild to moderate restriction of abduction  and when the hip his flexed to 90 he has maybe 5 degrees of rotational movement Pain with terminal IROM and EROM: Pain GTB: NT SLR: NEG Knees: No effusion FABER: NT REVERSE FABER: NT, neg Piriformis: NT at direct palpation Str: flexion: 5/5 abduction: 5/5 adduction: 5/5 Strength testing non-tender    Radiology: CLINICAL DATA:  Right lateral hip pain for the past 5-6 years, worse recently after a boxing accident.   EXAM: DG HIP (WITH OR WITHOUT PELVIS) 2-3V RIGHT   COMPARISON:  PET-CT-10/13/2020 ([facility]).   FINDINGS: No fracture or dislocation. Moderate degenerative change of the right hip with joint space loss, subchondral sclerosis and osteophytosis. No evidence of avascular necrosis. Incidental note made of a punctate bone island within the right femoral head neck junction, unchanged. Limited visualization of the adjacent pelvis is normal. Regional soft tissues appear normal.   IMPRESSION: Moderate degenerative change of the right hip, similar to PET-CT performed 10/13/2020.     Electronically Signed   By: Justin Mariscal M.D.   On: 05/18/2021 08:59  Assessment and Plan:     ICD-10-CM   1. Primary localized osteoarthritis of hip  M16.10 DG FLUORO GUIDED NEEDLE PLC ASPIRATION/INJECTION LOC    2. Chronic pain of right hip  M25.551 DG FLUORO GUIDED NEEDLE PLC ASPIRATION/INJECTION LOC   G89.29 meloxicam (MOBIC) 15  MG tablet     He has markedly impaired with his hip at a young age with advanced for age arthritic changes.  Clinically, he is able to only rotate his hip a very minimal amount.  He is also impaired with some of his activities of daily living.  I tried to be honest.  I think it is reasonable to try some high-dose anti-inflammatories, and also been going to send him for a fluoroscopy guided hip injection.  Ultimately, I think the total joint arthroplasty is probably going to be the only thing that will give him  long-lasting relief.  Meds ordered this encounter  Medications   meloxicam (MOBIC) 15 MG tablet    Sig: Take 1 tablet (15 mg total) by mouth daily as needed for pain.    Dispense:  30 tablet    Refill:  5   Medications Discontinued During This Encounter  Medication Reason   meloxicam (MOBIC) 15 MG tablet Discontinued by provider   Orders Placed This Encounter  Procedures   DG FLUORO GUIDED NEEDLE PLC ASPIRATION/INJECTION LOC    Follow-up: No follow-ups on file.  Dragon [employer] One speech-to-text software was used for transcription in this dictation.  Possible transcriptional errors can occur using Editor, commissioning.   Signed,  Maud Deed. Deisi Salonga, MD   Outpatient Encounter Medications as of 07/01/2021  Medication Sig   blood glucose meter kit and supplies KIT Dispense based on patient and insurance preference. Use up to four times daily as directed. (FOR ICD-9 250.00, 250.01).   famotidine (PEPCID) 20 MG tablet Take 1 tablet (20 mg total) by mouth daily. For heartburn   glucose blood test strip Use three times daily as instructed   magnesium oxide (MAG-OX) 400 (241.3 Mg) MG tablet Take 1 tablet (400 mg total) by mouth 2 (two) times daily.   omeprazole (PRILOSEC) 20 MG capsule Take 20 mg by mouth daily.   oxymetazoline (AFRIN) 0.05 % nasal spray Place 1-2 sprays into both nostrils as needed for congestion.    rosuvastatin (CRESTOR) 40 MG tablet Take 1 tablet (40 mg total) by mouth daily. For cholesterol.   tadalafil (CIALIS) 5 MG tablet Take 1 tablet (5 mg total) by mouth daily as needed for erectile dysfunction.   meloxicam (MOBIC) 15 MG tablet Take 1 tablet (15 mg total) by mouth daily as needed for pain.   [DISCONTINUED] meloxicam (MOBIC) 15 MG tablet Take 1 tablet (15 mg total) by mouth daily as needed for pain.   No facility-administered encounter medications on file as of 07/01/2021.

## 2021-07-01 ENCOUNTER — Encounter: Payer: Self-pay | Admitting: Family Medicine

## 2021-07-01 ENCOUNTER — Other Ambulatory Visit: Payer: Self-pay

## 2021-07-01 ENCOUNTER — Ambulatory Visit (INDEPENDENT_AMBULATORY_CARE_PROVIDER_SITE_OTHER): Payer: BC Managed Care – PPO | Admitting: Family Medicine

## 2021-07-01 VITALS — BP 128/90 | HR 71 | Temp 98.3°F | Ht 72.0 in | Wt 201.8 lb

## 2021-07-01 DIAGNOSIS — G8929 Other chronic pain: Secondary | ICD-10-CM

## 2021-07-01 DIAGNOSIS — M1610 Unilateral primary osteoarthritis, unspecified hip: Secondary | ICD-10-CM

## 2021-07-01 DIAGNOSIS — M25551 Pain in right hip: Secondary | ICD-10-CM | POA: Diagnosis not present

## 2021-07-01 MED ORDER — MELOXICAM 15 MG PO TABS: 15.0000 mg | ORAL_TABLET | Freq: Every day | ORAL | 5 refills | Status: DC | PRN

## 2021-07-09 ENCOUNTER — Telehealth: Payer: Self-pay

## 2021-07-09 ENCOUNTER — Inpatient Hospital Stay: Payer: BC Managed Care – PPO | Admitting: Hematology and Oncology

## 2021-07-09 ENCOUNTER — Telehealth: Payer: Self-pay | Admitting: Hematology and Oncology

## 2021-07-09 NOTE — Telephone Encounter (Signed)
Attempted to call patient regarding missed appointment from today, no answer. Scheduling message sent.

## 2021-07-09 NOTE — Telephone Encounter (Signed)
Scheduled per sch msg. Called, no answer. Mailed printotu

## 2021-07-16 DIAGNOSIS — F418 Other specified anxiety disorders: Secondary | ICD-10-CM | POA: Diagnosis not present

## 2021-08-03 ENCOUNTER — Other Ambulatory Visit: Payer: Self-pay | Admitting: Primary Care

## 2021-08-03 DIAGNOSIS — K219 Gastro-esophageal reflux disease without esophagitis: Secondary | ICD-10-CM

## 2021-08-04 DIAGNOSIS — F418 Other specified anxiety disorders: Secondary | ICD-10-CM | POA: Diagnosis not present

## 2021-08-05 NOTE — Telephone Encounter (Signed)
This is not for or a MR Arthrogram or a CT Arthrogram.    I ordered a fluoro guided hip injection only if that helps.

## 2021-08-05 NOTE — Telephone Encounter (Signed)
Noted. Spoke with patient to make sure he was aware and he was

## 2021-08-05 NOTE — Telephone Encounter (Signed)
Can you check on this?  I cannot figure out why his fluoro guided hip injection was not scheduled. I ordered it over a month ago.  I ordered a fluoro guided hip injection through [facility].  If there is a question or confusion, please send me a MyChart directly.

## 2021-08-13 ENCOUNTER — Inpatient Hospital Stay: Admission: RE | Admit: 2021-08-13 | Payer: BC Managed Care – PPO | Source: Ambulatory Visit

## 2021-08-14 ENCOUNTER — Other Ambulatory Visit: Payer: Self-pay

## 2021-08-14 ENCOUNTER — Ambulatory Visit
Admission: RE | Admit: 2021-08-14 | Discharge: 2021-08-14 | Disposition: A | Discharge status: Home / Self Care | Payer: BC Managed Care – PPO | Source: Ambulatory Visit | Attending: Family Medicine | Admitting: Family Medicine

## 2021-08-14 DIAGNOSIS — M1610 Unilateral primary osteoarthritis, unspecified hip: Secondary | ICD-10-CM

## 2021-08-14 DIAGNOSIS — M25551 Pain in right hip: Secondary | ICD-10-CM | POA: Diagnosis not present

## 2021-08-14 DIAGNOSIS — G8929 Other chronic pain: Secondary | ICD-10-CM

## 2021-08-14 MED ORDER — METHYLPREDNISOLONE ACETATE 40 MG/ML INJ SUSP (RADIOLOG
80.0000 mg | Freq: Once | INTRAMUSCULAR | Status: AC
  Administered 2021-08-14: 80 mg via INTRA_ARTICULAR

## 2021-08-14 MED ORDER — IOPAMIDOL (ISOVUE-M 200) INJECTION 41%
1.0000 mL | Freq: Once | INTRAMUSCULAR | Status: AC
  Administered 2021-08-14: 1 mL via INTRA_ARTICULAR

## 2021-08-18 DIAGNOSIS — F418 Other specified anxiety disorders: Secondary | ICD-10-CM | POA: Diagnosis not present

## 2021-09-04 DIAGNOSIS — N5201 Erectile dysfunction due to arterial insufficiency: Secondary | ICD-10-CM | POA: Diagnosis not present

## 2021-09-08 DIAGNOSIS — F418 Other specified anxiety disorders: Secondary | ICD-10-CM | POA: Diagnosis not present

## 2021-09-21 DIAGNOSIS — Z3009 Encounter for other general counseling and advice on contraception: Secondary | ICD-10-CM | POA: Diagnosis not present

## 2021-09-25 ENCOUNTER — Encounter: Payer: Self-pay | Admitting: Family Medicine

## 2021-09-25 NOTE — Telephone Encounter (Signed)
Patient last had hip injection on 08/14/21. He will not be able to have another one until February right? Or does he need to call radiology to verify their procedure?

## 2021-10-06 DIAGNOSIS — F418 Other specified anxiety disorders: Secondary | ICD-10-CM | POA: Diagnosis not present

## 2021-10-07 DIAGNOSIS — R635 Abnormal weight gain: Secondary | ICD-10-CM | POA: Diagnosis not present

## 2021-10-07 DIAGNOSIS — E291 Testicular hypofunction: Secondary | ICD-10-CM | POA: Diagnosis not present

## 2021-10-23 DIAGNOSIS — Z302 Encounter for sterilization: Secondary | ICD-10-CM | POA: Diagnosis not present

## 2021-10-29 DIAGNOSIS — F418 Other specified anxiety disorders: Secondary | ICD-10-CM | POA: Diagnosis not present

## 2021-11-10 DIAGNOSIS — F418 Other specified anxiety disorders: Secondary | ICD-10-CM | POA: Diagnosis not present

## 2021-12-25 IMAGING — XA DG FLUORO GUIDE NDL PLC/BX
1 series · 1 of 1 positions shown · non-contrast
Comparison: None.

CLINICAL DATA: Right hip pain

EXAM:
RIGHT HIP INJECTION UNDER FLUOROSCOPY

[Series 1: ortho standard · 1 of 1 slices shown]
[im 1/1]
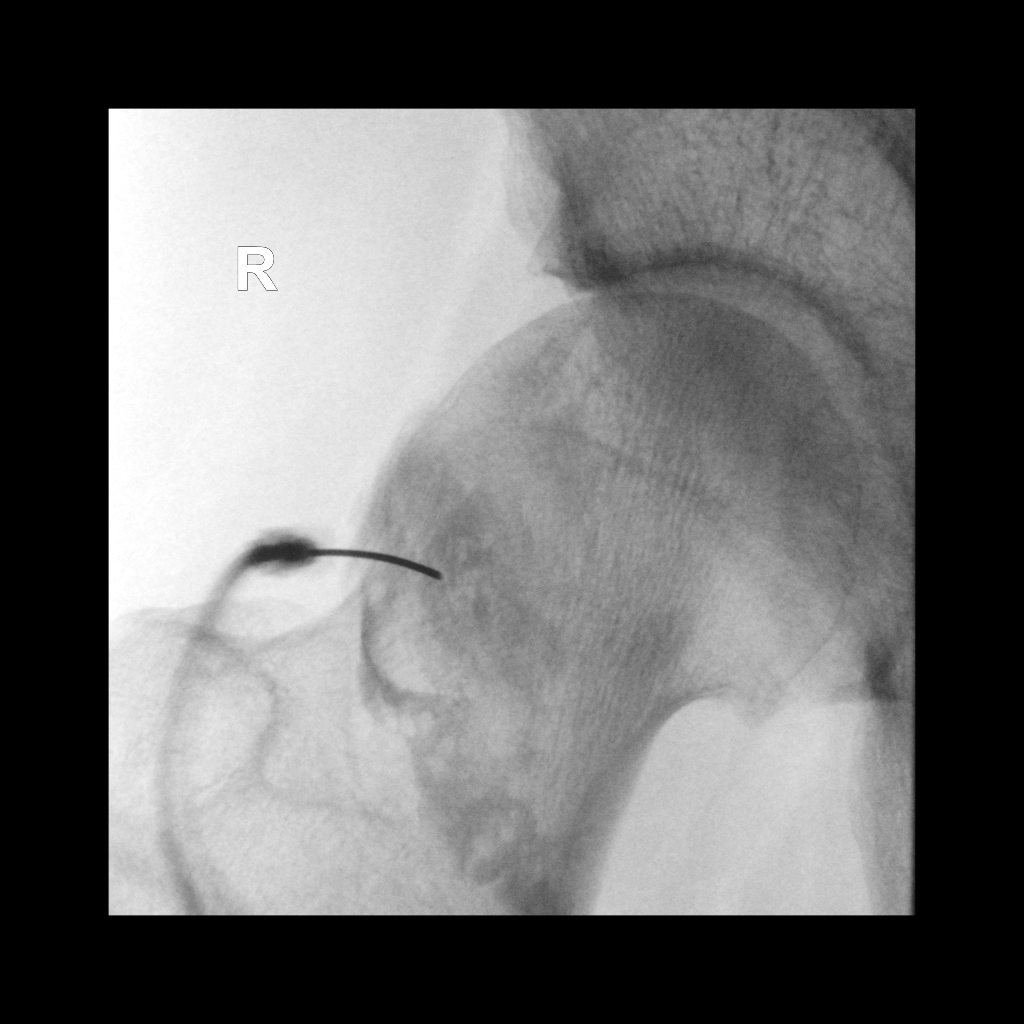

[1 of 1 positions shown; findings below may reference images not displayed]

FLUOROSCOPY TIME:  Fluoroscopy Time: 17 seconds with 1 exposure

PROCEDURE:
Overlying skin prepped with Betadine, draped in the usual sterile
fashion, and infiltrated locally with buffered Lidocaine. Curved 20
gauge spinal needle advanced to the superolateral margin of the
right femoral head. 1 ml of Lidocaine injected easily. Diagnostic
injection of iodinated contrast demonstrates intra-articular spread
without intravascular component.

80mg Depo-Medrol and 5 ml bupivacaine 0.25% were then administered.
No immediate complication.
IMPRESSION: Technically successful right hip steroid injection under
fluoroscopy.

## 2022-01-06 ENCOUNTER — Encounter: Payer: Self-pay | Admitting: Adult Health

## 2022-01-06 ENCOUNTER — Other Ambulatory Visit: Payer: Self-pay

## 2022-01-06 ENCOUNTER — Inpatient Hospital Stay: Payer: BC Managed Care – PPO | Attending: Adult Health | Admitting: Adult Health

## 2022-01-06 VITALS — BP 129/53 | HR 68 | Temp 98.1°F | Resp 19 | Ht 72.0 in | Wt 208.1 lb

## 2022-01-06 DIAGNOSIS — K029 Dental caries, unspecified: Secondary | ICD-10-CM | POA: Insufficient documentation

## 2022-01-06 DIAGNOSIS — Z87891 Personal history of nicotine dependence: Secondary | ICD-10-CM | POA: Insufficient documentation

## 2022-01-06 DIAGNOSIS — Z923 Personal history of irradiation: Secondary | ICD-10-CM | POA: Insufficient documentation

## 2022-01-06 DIAGNOSIS — K5900 Constipation, unspecified: Secondary | ICD-10-CM | POA: Diagnosis not present

## 2022-01-06 DIAGNOSIS — Z1211 Encounter for screening for malignant neoplasm of colon: Secondary | ICD-10-CM | POA: Diagnosis not present

## 2022-01-06 DIAGNOSIS — C090 Malignant neoplasm of tonsillar fossa: Secondary | ICD-10-CM | POA: Diagnosis not present

## 2022-01-06 NOTE — Progress Notes (Signed)
Justin Baldwin: ?  ? ?Pleas [name], NP ?[place] [id number] ? ? ?DIAGNOSIS:  Cancer Staging  ?Cancer of tonsillar fossa ([place]) ?Staging form: Pharynx - HPV-Mediated Oropharynx, AJCC 8th Edition ?- Clinical stage from 10/01/2020: Stage II (cT3, cN2, cM0, p16+) - Signed by Eppie Gibson, MD on 02/11/2021 ?Stage prefix: Initial diagnosis ? ? ?SUMMARY OF ONCOLOGIC HISTORY: ?Oncology History Overview Note  ?Justin Baldwin is a 46 y.o. male who presented with ongoing head cold symptoms, sinus issues, left ear pain, and left-sided throat pain since September.  When the cold-like symptoms resolved, the throat and ear pain did not. Augmentin was prescribed without relief. ?  ?Subsequently, the patient saw Dr. Redmond Baseman, ENT at [facility], who noted that the left tonsil had a mass appearance on 09/19/2020. 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 ?  ?Pertinent imaging thus far includes soft tissue neck CT scan on 09/30/2020 revealed a left tonsillar mass abutting the glossotonsillar sulcus and base of the tongue, consistent with known malignancy. Left level 2/3 cystic or necrotic adenopathy reflected nodal metastasis. ?  ?CT scan of chest performed on 09/30/2020 revealing a non-specific left lower lobe pulmonary micronodule but no acute intrathoracic abnormality and no definite intrathoracic metastasis.  I have personally reviewed his imaging and discussed him with Dr. Redmond Baseman.   ?  ?He had a PET scan which showed hypermetabolic left tonsillar mass and bilateral lymphadenopathy consistent with metastatic disease.  Hence recommendation was to proceed with concurrent chemoradiation. ? ?He started radiation on 10/28/2020 chemotherapy delayed to 11/07/2020 because of COVID-19 infection.   ?Completed two cycles of cisplatin at a dose of 100 mg/m2. ?  ?Cancer of tonsillar fossa ([place])  ?10/01/2020 Initial Diagnosis  ? Cancer of tonsillar fossa ([place]) ?   ?10/01/2020 Cancer Staging  ? Staging form: Pharynx - HPV-Mediated Oropharynx, AJCC 8th Edition ?- Clinical stage from 10/01/2020: Stage II (cT3, cN2, cM0, p16+) - Signed by Eppie Gibson, MD on 02/11/2021 ?Stage prefix: Initial diagnosis ? ?  ?11/07/2020 - 12/03/2020 Chemotherapy  ? Completed 2 cycles of cisplatin at 100 mg/m2 ? ?  ? ?  ? ? ?CURRENT THERAPY: observation ? ?INTERVAL HISTORY: ?Justin Baldwin 46 y.o. male returns for evaluation of h/o tonsillar carcinoma s/p chemoradiation here for follow Baldwin.  He tells me that his neck is tight, however he was unable to get lymphedema garments due to lack of insurance coverage.  He has also had some bowel changes.  He denies any odynophagia, dysphagia.  He is unsure of what his f/u should look like moving forward.   ? ? ?Patient Active Problem List  ? Diagnosis Date Noted  ? Chronic pain of right hip 05/15/2021  ? Lymphedema 02/06/2021  ? Drug induced constipation 01/13/2021  ? Mucositis due to antineoplastic therapy 01/12/2021  ? Weight loss, unintentional 01/12/2021  ? Port-A-Cath in place 12/04/2020  ? Cancer of tonsillar fossa ([place]) 10/01/2020  ? Type 2 diabetes mellitus ([place]) 09/03/2019  ? Gastroesophageal reflux disease 03/06/2019  ? Atherosclerosis of aorta ([place]) 11/30/2018  ? Medication monitoring encounter 10/31/2018  ? Atypical mycobacterium infection   ? Abscess of buttock, left 09/22/2018  ? Hydradenitis 09/22/2018  ? Kidney insufficiency 09/22/2018  ? Lump 08/31/2018  ? Deep tissue injury 08/25/2018  ? Skin rash 07/25/2018  ? Fatigue 11/08/2017  ? Vertigo 07/11/2017  ? Preventative health care 03/29/2016  ? Insomnia 03/15/2016  ?  Erectile dysfunction 03/15/2016  ? ? ?has No Known Allergies. ? ?MEDICAL HISTORY: ?Past Medical History:  ?Diagnosis Date  ? Diabetes mellitus without complication ([place])   ? Dizziness 11/30/2018  ? Erectile dysfunction   ? Insomnia   ? Skin abnormality   ? hidradenitis suppurativa   ? Throat cancer ([place])   ? ? ?SURGICAL HISTORY: ?Past  Surgical History:  ?Procedure Laterality Date  ? IR GASTROSTOMY TUBE MOD SED  11/21/2020  ? IR GASTROSTOMY TUBE REMOVAL  02/27/2021  ? IR IMAGING GUIDED PORT INSERTION  11/21/2020  ? IR REMOVAL TUN ACCESS W/ PORT W/O FL MOD SED  06/05/2021  ? IRRIGATION AND DEBRIDEMENT ABSCESS N/A 09/22/2018  ? Procedure: IRRIGATION AND DEBRIDEMENT ABSCESS;  Surgeon: Ralene Ok, MD;  Location: [facility];  Service: General;  Laterality: N/A;  ? ? ?SOCIAL HISTORY: ?Social History  ? ?Socioeconomic History  ? Marital status: Married  ?  Spouse name: Not on file  ? Number of children: Not on file  ? Years of education: Not on file  ? Highest education level: Not on file  ?Occupational History  ? Not on file  ?Tobacco Use  ? Smoking status: Former  ?  Packs/day: 1.00  ?  Types: Cigarettes  ?  Quit date: 10/27/2020  ?  Years since quitting: 1.1  ? Smokeless tobacco: Former  ?  Quit date: 02/14/2016  ?Vaping Use  ? Vaping Use: Never used  ?Substance and Sexual Activity  ? Alcohol use: Yes  ?  Alcohol/week: 5.0 standard drinks  ?  Types: 5 Shots of liquor per week  ?  Comment: occasinal   ? Drug use: No  ? Sexual activity: Yes  ?Other Topics Concern  ? Not on file  ?Social History Narrative  ? ** Merged History Encounter **  ?    ? Married. ?2 children. ?Works as a Nutritional therapist ?Enjoys spending time with family.   ? ?Social Determinants of Health  ? ?Financial Resource Strain: Not on file  ?Food Insecurity: Not on file  ?Transportation Needs: Not on file  ?Physical Activity: Not on file  ?Stress: Not on file  ?Social Connections: Not on file  ?Intimate Partner Violence: Not on file  ? ? ?FAMILY HISTORY: ?Family History  ?Problem Relation Age of Onset  ? Heart attack Father   ? Emphysema Paternal Grandmother   ? Arthritis Mother   ? ? ?Review of Systems  ?Constitutional:  Negative for appetite change, chills, fatigue, fever and unexpected weight change.  ?HENT:   Negative for hearing loss, lump/mass and trouble swallowing.   ?Eyes:  Negative for  eye problems and icterus.  ?Respiratory:  Negative for chest tightness, cough and shortness of breath.   ?Cardiovascular:  Negative for chest pain, leg swelling and palpitations.  ?Gastrointestinal:  Positive for constipation. Negative for abdominal distention, abdominal pain, diarrhea, nausea and vomiting.  ?Endocrine: Negative for hot flashes.  ?Genitourinary:  Negative for difficulty urinating.   ?Musculoskeletal:  Negative for arthralgias.  ?Skin:  Negative for itching and rash.  ?Neurological:  Negative for dizziness, extremity weakness, headaches and numbness.  ?Hematological:  Negative for adenopathy. Does not bruise/bleed easily.  ?Psychiatric/Behavioral:  Negative for depression. The patient is not nervous/anxious.    ? ? ?PHYSICAL EXAMINATION ? ?ECOG PERFORMANCE STATUS: 1 - Symptomatic but completely ambulatory ? ?Vitals:  ? 01/06/22 0920  ?BP: (!) 129/53  ?Pulse: 68  ?Resp: 19  ?Temp: 98.1 ?F (36.7 ?C)  ?SpO2: 99%  ? ? ?Physical Exam ?Constitutional:   ?  General: He is not in acute distress. ?   Appearance: Normal appearance. He is not toxic-appearing.  ?HENT:  ?   Head: Normocephalic and atraumatic.  ?Eyes:  ?   General: No scleral icterus. ?Cardiovascular:  ?   Rate and Rhythm: Normal rate and regular rhythm.  ?   Pulses: Normal pulses.  ?   Heart sounds: Normal heart sounds.  ?Pulmonary:  ?   Effort: Pulmonary effort is normal.  ?   Breath sounds: Normal breath sounds.  ?Abdominal:  ?   General: Abdomen is flat. Bowel sounds are normal. There is no distension.  ?   Palpations: Abdomen is soft.  ?   Tenderness: There is no abdominal tenderness.  ?Musculoskeletal:     ?   General: No swelling.  ?   Cervical back: Neck supple.  ?Lymphadenopathy:  ?   Cervical: No cervical adenopathy.  ?Skin: ?   General: Skin is warm and dry.  ?   Findings: No rash.  ?Neurological:  ?   General: No focal deficit present.  ?   Mental Status: He is alert.  ?Psychiatric:     ?   Mood and Affect: Mood normal.     ?    Behavior: Behavior normal.  ? ? ?LABORATORY DATA: ? ?CBC ?   ?Component Value Date/Time  ? WBC 6.7 05/29/2021 1937  ? RBC 4.70 05/29/2021 1937  ? HGB 15.1 05/29/2021 1937  ? HGB 13.5 12/11/2020 0816  ? HCT 45.

## 2022-01-07 NOTE — Assessment & Plan Note (Signed)
Cancer of tonsillary fossa s/p treatment ? ?Justin Baldwin has no sign of recurrence of his cancer.  His weight is stable.  He does have some long term issues.   ? ?1. Lymphedema: could go back and see PT since insurance will not cover the lymphedema garments for his neck.   ? ?2. Dental caries: He is following up with his dentist since he was not contacted by our dentistry department.  He has an upcoming appointment.   ? ?3.  Constipation: could be due to dietary changes associated with his cancer and decreased water intake, could lead to constipation.  ? ?I reviewed the above with Justin Baldwin.  He will f/u with Dr. Isidore Moos in 4 months, Dr. Redmond Baseman in 8 months, and Dr. Chryl Heck in one year.   ? ?

## 2022-01-14 DIAGNOSIS — F418 Other specified anxiety disorders: Secondary | ICD-10-CM | POA: Diagnosis not present

## 2022-02-18 DIAGNOSIS — F418 Other specified anxiety disorders: Secondary | ICD-10-CM | POA: Diagnosis not present

## 2022-03-11 DIAGNOSIS — F418 Other specified anxiety disorders: Secondary | ICD-10-CM | POA: Diagnosis not present

## 2022-04-20 ENCOUNTER — Encounter: Payer: Self-pay | Admitting: Family Medicine

## 2022-04-20 DIAGNOSIS — M1610 Unilateral primary osteoarthritis, unspecified hip: Secondary | ICD-10-CM

## 2022-04-20 DIAGNOSIS — G8929 Other chronic pain: Secondary | ICD-10-CM

## 2022-04-30 ENCOUNTER — Ambulatory Visit
Admission: RE | Admit: 2022-04-30 | Discharge: 2022-04-30 | Disposition: A | Discharge status: Home / Self Care | Payer: BC Managed Care – PPO | Source: Ambulatory Visit | Attending: Family Medicine | Admitting: Family Medicine

## 2022-04-30 DIAGNOSIS — M1610 Unilateral primary osteoarthritis, unspecified hip: Secondary | ICD-10-CM

## 2022-04-30 DIAGNOSIS — M1611 Unilateral primary osteoarthritis, right hip: Secondary | ICD-10-CM | POA: Diagnosis not present

## 2022-04-30 DIAGNOSIS — G8929 Other chronic pain: Secondary | ICD-10-CM

## 2022-04-30 MED ORDER — METHYLPREDNISOLONE ACETATE 40 MG/ML INJ SUSP (RADIOLOG
80.0000 mg | Freq: Once | INTRAMUSCULAR | Status: AC
  Administered 2022-04-30: 80 mg via INTRA_ARTICULAR

## 2022-04-30 MED ORDER — IOPAMIDOL (ISOVUE-M 200) INJECTION 41%
1.0000 mL | Freq: Once | INTRAMUSCULAR | Status: AC
  Administered 2022-04-30: 1 mL via INTRA_ARTICULAR

## 2022-05-08 ENCOUNTER — Other Ambulatory Visit: Payer: Self-pay

## 2022-05-08 ENCOUNTER — Emergency Department (HOSPITAL_BASED_OUTPATIENT_CLINIC_OR_DEPARTMENT_OTHER)
Admission: EM | Admit: 2022-05-08 | Discharge: 2022-05-09 | Disposition: A | Discharge status: Home / Self Care | Payer: BC Managed Care – PPO | Attending: Emergency Medicine | Admitting: Emergency Medicine

## 2022-05-08 ENCOUNTER — Encounter (HOSPITAL_BASED_OUTPATIENT_CLINIC_OR_DEPARTMENT_OTHER): Payer: Self-pay

## 2022-05-08 DIAGNOSIS — M25551 Pain in right hip: Secondary | ICD-10-CM | POA: Diagnosis not present

## 2022-05-08 DIAGNOSIS — M533 Sacrococcygeal disorders, not elsewhere classified: Secondary | ICD-10-CM

## 2022-05-08 NOTE — ED Triage Notes (Signed)
Complains with tailbone (sacral) pain that started around 4 am yesterday morning.  Denies injury, but did have right hip injected 8 days ago.

## 2022-05-09 ENCOUNTER — Emergency Department (HOSPITAL_BASED_OUTPATIENT_CLINIC_OR_DEPARTMENT_OTHER): Payer: BC Managed Care – PPO

## 2022-05-09 DIAGNOSIS — M1611 Unilateral primary osteoarthritis, right hip: Secondary | ICD-10-CM | POA: Diagnosis not present

## 2022-05-09 MED ORDER — OXYCODONE-ACETAMINOPHEN 5-325 MG PO TABS: 1.0000 | ORAL_TABLET | Freq: Four times a day (QID) | ORAL | 0 refills | Status: DC | PRN

## 2022-05-09 MED ORDER — KETOROLAC TROMETHAMINE 30 MG/ML IJ SOLN
30.0000 mg | Freq: Once | INTRAMUSCULAR | Status: AC
  Administered 2022-05-09: 30 mg via INTRAMUSCULAR
  Filled 2022-05-09: qty 1

## 2022-05-09 MED ORDER — HYDROMORPHONE HCL 1 MG/ML IJ SOLN
1.0000 mg | Freq: Once | INTRAMUSCULAR | Status: AC
  Administered 2022-05-09: 1 mg via INTRAMUSCULAR
  Filled 2022-05-09: qty 1

## 2022-05-09 NOTE — ED Provider Notes (Signed)
[facility] EMERGENCY DEPT  Provider Note  CSN: [id number] Arrival date & time: 05/08/22 2306  History Chief Complaint  Patient presents with   Tailbone Pain    Justin Baldwin is a 46 y.o. male with history of tonsillar cancer in remission and chronic R hip pain/arthritis had a steroid injection in his hip a few days ago on 7/7. He was doing better but about 24 hours ago he began to have severe pain in his sacrum that has not been improving with his home APAP and Mobic. No falls or injuries. No difficulty with bowels or bladder. No fever or swelling. No prior history of same.    Home Medications Prior to Admission medications   Medication Sig Start Date End Date Taking? Authorizing Provider  oxyCODONE-acetaminophen (PERCOCET/ROXICET) 5-325 MG tablet Take 1 tablet by mouth every 6 (six) hours as needed for severe pain. 05/09/22  Yes Truddie Hidden, MD  anastrozole (ARIMIDEX) 1 MG tablet Take 2 mg by mouth once a week. 11/02/21   [provider]  famotidine (PEPCID) 20 MG tablet TAKE 1 TABLET BY MOUTH ONCE DAILY For heartburn 08/04/21   Pleas [name], NP  magnesium oxide (MAG-OX) 400 (241.3 Mg) MG tablet Take 1 tablet (400 mg total) by mouth 2 (two) times daily. 01/13/21   Benay Pike, MD  meloxicam (MOBIC) 15 MG tablet Take 1 tablet (15 mg total) by mouth daily as needed for pain. 07/01/21   Copland, Frederico Hamman, MD  omeprazole (PRILOSEC) 20 MG capsule Take 20 mg by mouth daily. 03/18/21   [provider]  oxymetazoline (AFRIN) 0.05 % nasal spray Place 1-2 sprays into both nostrils as needed for congestion.     [provider]  rosuvastatin (CRESTOR) 40 MG tablet Take 1 tablet (40 mg total) by mouth daily. For cholesterol. 06/24/21   Pleas [name], NP  tadalafil (CIALIS) 5 MG tablet Take 1 tablet (5 mg total) by mouth daily as needed for erectile dysfunction. 12/10/19   Pleas [name], NP     Allergies    Patient has no known  allergies.   Review of Systems   Review of Systems Please see HPI for pertinent positives and negatives  Physical Exam BP (!) 148/93   Pulse 63   Temp 98.1 F (36.7 C) (Oral)   Resp 18   Ht 6' (1.829 m)   Wt 90.3 kg   SpO2 94%   BMI 26.99 kg/m   Physical Exam Vitals and nursing note reviewed.  HENT:     Head: Normocephalic.     Nose: Nose normal.  Eyes:     Extraocular Movements: Extraocular movements intact.  Pulmonary:     Effort: Pulmonary effort is normal.  Musculoskeletal:        General: Normal range of motion.     Cervical back: Neck supple.     Comments: Tenderness over the distal sacrum/proximal coccyx and R SI joint, no soft tissue swelling/redness to suggest infection  Skin:    Findings: No rash (on exposed skin).  Neurological:     Mental Status: He is alert and oriented to person, place, and time.  Psychiatric:        Mood and Affect: Mood normal.     ED Results / Procedures / Treatments   EKG None  Procedures Procedures  Medications Ordered in the ED Medications  ketorolac (TORADOL) 30 MG/ML injection 30 mg (30 mg Intramuscular Given 05/09/22 0043)  HYDROmorphone (DILAUDID) injection 1 mg (1 mg  Intramuscular Given 05/09/22 0103)    Initial Impression and Plan  Patient here with atraumatic sacral pain, history of tonsillar cancer, no known mets. Will send for CT to eval. Toradol for pain as he drove himself. Advised he can have stronger meds if needed when he has a safe ride home.   ED Course   Clinical Course as of 05/09/22 0541  Sun May 09, 2022  0149 I personally viewed the images from radiology studies and agree with radiologist interpretation: Discussed with Radiologist, there is chronic appearing arthritis in the right hip but also a suspicious bony lesion in the distal sacrum. No other acute findings. Patient reports pain is improved some. Recommend outpatient follow up with his oncologist for evaluation of possible new bony lesion vs  sacroiliitis. Rx for pain medication. RTED for any other concerns.   [CS]    Clinical Course User Index [CS] Truddie Hidden, MD     MDM Rules/Calculators/A&P Medical Decision Making Problems Addressed: Pain in sacrum: acute illness or injury  Amount and/or Complexity of Data Reviewed Radiology: ordered and independent interpretation performed. Decision-making details documented in ED Course.  Risk Prescription drug management. Parenteral controlled substances.    Final Clinical Impression(s) / ED Diagnoses Final diagnoses:  Pain in sacrum    Rx / DC Orders ED Discharge Orders          Ordered    oxyCODONE-acetaminophen (PERCOCET/ROXICET) 5-325 MG tablet  Every 6 hours PRN        05/09/22 0152             Truddie Hidden, MD 05/09/22 [phone]

## 2022-05-09 NOTE — ED Notes (Signed)
Reviewed AVS/discharge instruction with patient. Time allotted for and all questions answered. Patient is agreeable for d/c and escorted to ed exit by staff.  

## 2022-05-09 NOTE — ED Notes (Signed)
Patient transported to CT 

## 2022-05-10 ENCOUNTER — Encounter: Payer: Self-pay | Admitting: Adult Health

## 2022-05-10 ENCOUNTER — Telehealth: Payer: Self-pay

## 2022-05-10 NOTE — Telephone Encounter (Signed)
Pt called to schedule appt after being seen in ED with suspicious scans. Pt is aware MD is on PAL this week so he will see NP tomorrow at 0915. He knows to arrive at 0900 for check in.

## 2022-05-11 ENCOUNTER — Other Ambulatory Visit: Payer: Self-pay

## 2022-05-11 ENCOUNTER — Inpatient Hospital Stay: Payer: BC Managed Care – PPO | Attending: Adult Health | Admitting: Adult Health

## 2022-05-11 VITALS — BP 139/65 | HR 68 | Temp 98.1°F | Resp 18 | Ht 72.0 in | Wt 209.6 lb

## 2022-05-11 DIAGNOSIS — Z923 Personal history of irradiation: Secondary | ICD-10-CM | POA: Diagnosis not present

## 2022-05-11 DIAGNOSIS — Z9221 Personal history of antineoplastic chemotherapy: Secondary | ICD-10-CM | POA: Insufficient documentation

## 2022-05-11 DIAGNOSIS — C090 Malignant neoplasm of tonsillar fossa: Secondary | ICD-10-CM | POA: Insufficient documentation

## 2022-05-11 NOTE — Assessment & Plan Note (Signed)
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  Most recent PET scan in 03/2021 shows resolution of FDG uptake.    He has had new hip and tailbone pain, and CT pelvis indeterminate.  He has not had any other symptoms, however upon presentation his symptoms were mild throat pain.    I ordered PET scan to be completed and my nurse has requested our prior authorization team complete this.   He returns on 05/19/2022 for f/u with Dr. Isidore Moos.  We will work to get imaging scheduled prior to this.

## 2022-05-11 NOTE — Progress Notes (Signed)
[facility] Follow up:    Pleas [name], NP [place] [id number]   DIAGNOSIS:  Cancer Staging  Cancer of tonsillar fossa ([place]) Staging form: Pharynx - HPV-Mediated Oropharynx, AJCC 8th Edition - Clinical stage from 10/01/2020: Stage II (cT3, cN2, cM0, p16+) - Signed by Eppie Gibson, MD on 02/11/2021 Stage prefix: Initial diagnosis   SUMMARY OF ONCOLOGIC HISTORY: Oncology History  Cancer of tonsillar fossa ([place])  08/2020 Initial Diagnosis   Justin Baldwin is a 46 y.o. male who presented with ongoing head cold symptoms, sinus issues, left ear pain, and left-sided throat pain since September.  When the cold-like symptoms resolved, the throat and ear pain did not. Augmentin was prescribed without relief.   09/19/2020 Initial Biopsy   Subsequently, the patient saw Dr. Redmond Baseman, ENT at [facility], who noted that the left tonsil had a mass appearance on 09/19/2020. 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   09/30/2020 Imaging   Pertinent imaging thus far includes soft tissue neck CT scan on 09/30/2020 revealed a left tonsillar mass abutting the glossotonsillar sulcus and base of the tongue, consistent with known malignancy. Left level 2/3 cystic or necrotic adenopathy reflected nodal metastasis.   CT scan of chest performed on 09/30/2020 revealing a non-specific left lower lobe pulmonary micronodule but no acute intrathoracic abnormality and no definite intrathoracic metastasis.  I have personally reviewed his imaging and discussed him with Dr. Redmond Baseman.     10/01/2020 Cancer Staging   Staging form: Pharynx - HPV-Mediated Oropharynx, AJCC 8th Edition - Clinical stage from 10/01/2020: Stage II (cT3, cN2, cM0, p16+) - Signed by Eppie Gibson, MD on 02/11/2021 Stage prefix: Initial diagnosis   10/10/2020 PET scan   He had a PET scan which showed hypermetabolic left tonsillar mass and bilateral lymphadenopathy  consistent with metastatic disease.  Hence recommendation was to proceed with concurrent chemoradiation.   10/28/2020 - 12/15/2020 Radiation Therapy   10/28/2020 through 12/15/2020 Site Technique Total Dose (Gy) Dose per Fx (Gy) Completed Fx Beam Energies  Tonsil, Left: HN_Lt_tonsil IMRT 70/70 2 35/35 6X    11/07/2020 - 12/03/2020 Chemotherapy   Completed 2 cycles of cisplatin at 100 mg/m2. (Chemo start delayed secondary to him having COVID19)       04/10/2021 PET scan   1. Interval resolution of previous FDG uptake associated with left tonsillar mass and FDG avid left level 2/3 cervical lymph node. No new sites of disease identified. 2. New long segment of increased uptake localizing to the esophagus. Correlate for any clinical signs or symptoms of esophagitis. 3.  Aortic Atherosclerosis (ICD10-I70.0).        CURRENT THERAPY:  INTERVAL HISTORY: Justin Baldwin 46 y.o. male returns for f/u regarding his recent ER visit for pelvic pain.  He underwent CT pelvis during his ER visit on 7/16 that demonstrated:  IMPRESSION: 1. No acute fracture or dislocation. 2. Moderate arthritic changes of the right hip. 3. Lucent lesions in the right femoral neck and distal sacrum. Further characterization with MRI on a nonemergent/outpatient basis recommended.  He is here for f/u to discuss these results.  He was prescribed Percocet during the visit.  He notes that he was having increased tailbone pain that was excruciating, however once he received Dilaudid and Toradol IV the pain has resolved.  He has not needed any percocet that he was prescribed in the ER.     Patient Active Problem List  Diagnosis Date Noted   Chronic pain of right hip 05/15/2021   Lymphedema 02/06/2021   Drug induced constipation 01/13/2021   Mucositis due to antineoplastic therapy 01/12/2021   Weight loss, unintentional 01/12/2021   Cancer of tonsillar fossa ([place]) 10/01/2020   Type 2 diabetes mellitus ([place]) 09/03/2019    Gastroesophageal reflux disease 03/06/2019   Atherosclerosis of aorta ([place]) 11/30/2018   Medication monitoring encounter 10/31/2018   Atypical mycobacterium infection    Abscess of buttock, left 09/22/2018   Hydradenitis 09/22/2018   Kidney insufficiency 09/22/2018   Lump 08/31/2018   Deep tissue injury 08/25/2018   Skin rash 07/25/2018   Fatigue 11/08/2017   Vertigo 07/11/2017   Preventative health care 03/29/2016   Insomnia 03/15/2016   Erectile dysfunction 03/15/2016    has No Known Allergies.  MEDICAL HISTORY: Past Medical History:  Diagnosis Date   Diabetes mellitus without complication ([place])    Dizziness 11/30/2018   Erectile dysfunction    Insomnia    Skin abnormality    hidradenitis suppurativa    Throat cancer ([place])     SURGICAL HISTORY: Past Surgical History:  Procedure Laterality Date   IR GASTROSTOMY TUBE MOD SED  11/21/2020   IR GASTROSTOMY TUBE REMOVAL  02/27/2021   IR IMAGING GUIDED PORT INSERTION  11/21/2020   IR REMOVAL TUN ACCESS W/ PORT W/O FL MOD SED  06/05/2021   IRRIGATION AND DEBRIDEMENT ABSCESS N/A 09/22/2018   Procedure: IRRIGATION AND DEBRIDEMENT ABSCESS;  Surgeon: Ralene Ok, MD;  Location: [facility];  Service: General;  Laterality: N/A;    SOCIAL HISTORY: Social History   Socioeconomic History   Marital status: Married    Spouse name: Not on file   Number of children: Not on file   Years of education: Not on file   Highest education level: Not on file  Occupational History   Not on file  Tobacco Use   Smoking status: Every Day    Packs/day: 1.00    Types: Cigarettes    Last attempt to quit: 10/27/2020    Years since quitting: 1.5   Smokeless tobacco: Former    Quit date: 02/14/2016  Vaping Use   Vaping Use: Never used  Substance and Sexual Activity   Alcohol use: Yes    Alcohol/week: 5.0 standard drinks of alcohol    Types: 5 Shots of liquor per week    Comment: occasinal    Drug use: No   Sexual activity: Yes  Other Topics  Concern   Not on file  Social History Narrative   ** Merged History Encounter **       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    Social Determinants of Health   Financial Resource Strain: Low Risk  (10/01/2020)   Overall Financial Resource Strain (CARDIA)    Difficulty of Paying Living Expenses: Not hard at all  Food Insecurity: No Food Insecurity (10/01/2020)   Hunger Vital Sign    Worried About Running Out of Food in the Last Year: Never true    Ran Out of Food in the Last Year: Never true  Transportation Needs: No Transportation Needs (10/01/2020)   PRAPARE - Hydrologist (Medical): No    Lack of Transportation (Non-Medical): No  Physical Activity: Not on file  Stress: Not on file  Social Connections: Not on file  Intimate Partner Violence: Not on file    FAMILY HISTORY: Family History  Problem Relation Age of  Onset   Heart attack Father    Emphysema Paternal Grandmother    Arthritis Mother     Review of Systems  Constitutional:  Negative for appetite change, chills, fatigue, fever and unexpected weight change.  HENT:   Negative for hearing loss, lump/mass and trouble swallowing.   Eyes:  Negative for eye problems and icterus.  Respiratory:  Negative for chest tightness, cough and shortness of breath.   Cardiovascular:  Negative for chest pain, leg swelling and palpitations.  Gastrointestinal:  Negative for abdominal distention, abdominal pain, constipation, diarrhea, nausea and vomiting.  Endocrine: Negative for hot flashes.  Genitourinary:  Negative for difficulty urinating.   Musculoskeletal:  Negative for arthralgias.  Skin:  Negative for itching and rash.  Neurological:  Negative for dizziness, extremity weakness, headaches and numbness.  Hematological:  Negative for adenopathy. Does not bruise/bleed easily.  Psychiatric/Behavioral:  Negative for depression. The patient is not nervous/anxious.        PHYSICAL EXAMINATION  ECOG PERFORMANCE STATUS: 0 - Asymptomatic  Vitals:   05/11/22 0922  BP: 139/65  Pulse: 68  Resp: 18  Temp: 98.1 F (36.7 C)  SpO2: 98%  Patient appears well, he is in no apparent distress.  Skin visualized without rash or lesions, mood and behavior are normal.  Gait normal, strength and motor function is normal.    LABORATORY DATA:  CBC    Component Value Date/Time   WBC 6.7 05/29/2021 1937   RBC 4.70 05/29/2021 1937   HGB 15.1 05/29/2021 1937   HGB 13.5 12/11/2020 0816   HCT 45.1 05/29/2021 1937   PLT 216 05/29/2021 1937   PLT 190 12/11/2020 0816   MCV 96.0 05/29/2021 1937   MCH 32.1 05/29/2021 1937   MCHC 33.5 05/29/2021 1937   RDW 13.1 05/29/2021 1937   LYMPHSABS 1.1 05/29/2021 1937   MONOABS 0.7 05/29/2021 1937   EOSABS 0.1 05/29/2021 1937   BASOSABS 0.1 05/29/2021 1937    CMP     Component Value Date/Time   NA 134 (L) 06/23/2021 0813   NA 135 09/12/2019 1048   K 5.2 (H) 06/23/2021 0813   CL 98 06/23/2021 0813   CO2 32 06/23/2021 0813   GLUCOSE 97 06/23/2021 0813   BUN 20 06/23/2021 0813   BUN 18 09/12/2019 1048   CREATININE 1.56 (H) 06/23/2021 0813   CREATININE 1.28 (H) 12/11/2020 0816   CREATININE 1.38 (H) 11/30/2018 1102   CALCIUM 9.2 06/23/2021 0813   PROT 5.8 (L) 05/29/2021 1937   ALBUMIN 3.5 05/29/2021 1937   AST 20 05/29/2021 1937   ALT 23 05/29/2021 1937   ALKPHOS 26 (L) 05/29/2021 1937   BILITOT 0.6 05/29/2021 1937   GFRNONAA >60 05/29/2021 1937   GFRNONAA >60 12/11/2020 0816   GFRNONAA 63 11/30/2018 1102   GFRAA 72 09/12/2019 1048   GFRAA 73 11/30/2018 1102        ASSESSMENT and THERAPY PLAN:   Cancer of tonsillar fossa ([place]) Justin Baldwin is a 46 year old man with h/o 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  Most recent PET scan in 03/2021 shows resolution of FDG uptake.    He has had new hip and tailbone pain, and CT pelvis indeterminate.   He has not had any other symptoms, however upon presentation his symptoms were mild throat pain.    I ordered PET scan to be completed and my nurse has requested our prior authorization team complete this.   He returns on  05/19/2022 for f/u with Dr. Isidore Moos.  We will work to get imaging scheduled prior to this.   All questions were answered. The patient knows to call the clinic with any problems, questions or concerns. We can certainly see the patient much sooner if necessary.  Total encounter time:20 minutes*in face-to-face visit time, chart review, lab review, care coordination, order entry, and documentation of the encounter time.    Wilber Bihari, NP 05/11/22 9:44 AM Medical Oncology and Hematology [facility], [place] [address] Tel. [phone]    Fax. [phone]  *Total Encounter Time as defined by the Centers for Medicare and Medicaid Services includes, in addition to the face-to-face time of a patient visit (documented in the note above) non-face-to-face time: obtaining and reviewing outside history, ordering and reviewing medications, tests or procedures, care coordination (communications with other health care professionals or caregivers) and documentation in the medical record.

## 2022-05-12 ENCOUNTER — Telehealth: Payer: Self-pay | Admitting: Hematology and Oncology

## 2022-05-12 NOTE — Telephone Encounter (Signed)
Scheduled appointment per request from rn. Patient aware.

## 2022-05-14 ENCOUNTER — Other Ambulatory Visit: Payer: Self-pay | Admitting: Adult Health

## 2022-05-14 MED ORDER — OXYCODONE-ACETAMINOPHEN 5-325 MG PO TABS: 1.0000 | ORAL_TABLET | Freq: Four times a day (QID) | ORAL | 0 refills | Status: DC | PRN

## 2022-05-17 ENCOUNTER — Encounter (HOSPITAL_COMMUNITY): Admission: RE | Admit: 2022-05-17 | Payer: BC Managed Care – PPO | Source: Ambulatory Visit

## 2022-05-17 ENCOUNTER — Encounter: Payer: Self-pay | Admitting: Adult Health

## 2022-05-17 ENCOUNTER — Telehealth: Payer: Self-pay

## 2022-05-17 NOTE — Telephone Encounter (Signed)
S/w pt regarding mychart message. Pt's scan was r/s for 7/28. Pt's appt with Dr Chryl Heck will need to be r/s for after PET scan. Pt will need appt 7/31 or 8/1. Message sent to MD's scheduler and pt is aware she will be in touch with him.

## 2022-05-18 ENCOUNTER — Telehealth: Payer: Self-pay | Admitting: *Deleted

## 2022-05-18 NOTE — Telephone Encounter (Signed)
RETURNED PATIENT'S PHONE CALL, PATIENT STATED THAT HE WANTS TO CANCEL TOMORROW'S APPT., I ASKED ABOUT RESCHEDULING THIS APPT., PATIENT STATED THAT HE WOULD, BUT COULDN'T RESCHEDULE AT THIS MOMENT.

## 2022-05-19 ENCOUNTER — Ambulatory Visit: Payer: BC Managed Care – PPO | Admitting: Radiation Oncology

## 2022-05-21 ENCOUNTER — Inpatient Hospital Stay (HOSPITAL_BASED_OUTPATIENT_CLINIC_OR_DEPARTMENT_OTHER): Payer: BC Managed Care – PPO | Admitting: Hematology and Oncology

## 2022-05-21 ENCOUNTER — Encounter: Payer: Self-pay | Admitting: Hematology and Oncology

## 2022-05-21 ENCOUNTER — Encounter (HOSPITAL_COMMUNITY)
Admission: RE | Admit: 2022-05-21 | Discharge: 2022-05-21 | Disposition: A | Discharge status: Home / Self Care | Payer: BC Managed Care – PPO | Source: Ambulatory Visit | Attending: Adult Health | Admitting: Adult Health

## 2022-05-21 ENCOUNTER — Inpatient Hospital Stay: Payer: BC Managed Care – PPO

## 2022-05-21 ENCOUNTER — Other Ambulatory Visit: Payer: Self-pay

## 2022-05-21 VITALS — BP 137/68 | HR 75 | Temp 97.9°F | Resp 16 | Ht 72.0 in | Wt 207.0 lb

## 2022-05-21 DIAGNOSIS — C090 Malignant neoplasm of tonsillar fossa: Secondary | ICD-10-CM | POA: Insufficient documentation

## 2022-05-21 DIAGNOSIS — C760 Malignant neoplasm of head, face and neck: Secondary | ICD-10-CM | POA: Diagnosis not present

## 2022-05-21 DIAGNOSIS — Z923 Personal history of irradiation: Secondary | ICD-10-CM | POA: Diagnosis not present

## 2022-05-21 DIAGNOSIS — C099 Malignant neoplasm of tonsil, unspecified: Secondary | ICD-10-CM | POA: Diagnosis not present

## 2022-05-21 DIAGNOSIS — Z9221 Personal history of antineoplastic chemotherapy: Secondary | ICD-10-CM | POA: Diagnosis not present

## 2022-05-21 LAB — CBC WITH DIFFERENTIAL/PLATELET
Abs Immature Granulocytes: 0.02 10*3/uL (ref 0.00–0.07)
Basophils Absolute: 0.1 10*3/uL (ref 0.0–0.1)
Basophils Relative: 1 %
Eosinophils Absolute: 0.1 10*3/uL (ref 0.0–0.5)
Eosinophils Relative: 1 %
HCT: 46.9 % (ref 39.0–52.0)
Hemoglobin: 15.9 g/dL (ref 13.0–17.0)
Immature Granulocytes: 0 %
Lymphocytes Relative: 13 %
Lymphs Abs: 1.1 10*3/uL (ref 0.7–4.0)
MCH: 32.1 pg (ref 26.0–34.0)
MCHC: 33.9 g/dL (ref 30.0–36.0)
MCV: 94.7 fL (ref 80.0–100.0)
Monocytes Absolute: 0.7 10*3/uL (ref 0.1–1.0)
Monocytes Relative: 8 %
Neutro Abs: 6.4 10*3/uL (ref 1.7–7.7)
Neutrophils Relative %: 77 %
Platelets: 248 10*3/uL (ref 150–400)
RBC: 4.95 MIL/uL (ref 4.22–5.81)
RDW: 14.6 % (ref 11.5–15.5)
WBC: 8.2 10*3/uL (ref 4.0–10.5)
nRBC: 0 % (ref 0.0–0.2)

## 2022-05-21 LAB — GLUCOSE, CAPILLARY: Glucose-Capillary: 93 mg/dL (ref 70–99)

## 2022-05-21 LAB — TSH: TSH: 1.781 u[IU]/mL (ref 0.350–4.500)

## 2022-05-21 MED ORDER — FLUDEOXYGLUCOSE F - 18 (FDG) INJECTION
10.4000 | Freq: Once | INTRAVENOUS | Status: AC
  Administered 2022-05-21: 10.33 via INTRAVENOUS

## 2022-05-21 NOTE — Assessment & Plan Note (Signed)
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  Most recent PET scan in 03/2021 shows resolution of FDG uptake.    He has had new hip and tailbone pain, and CT pelvis indeterminate.  He has not had any other symptoms, however upon presentation his symptoms were mild throat pain.    He had a PET/CT scheduled today and is here for follow-up.  At this time his pain has resolved and it does not appear to be related to his head and neck cancer.  We will however review PET results and let him know if he needs any further testing.  With regards to ENT surveillance, I encouraged him to call Dr. Redmond Baseman and set up follow-up.  He will also do labs today CBC and TSH to monitor acquired hypothyroidism from treatment.  He should otherwise return to clinic in 1 year or sooner based on the PET/CT results.

## 2022-05-21 NOTE — Progress Notes (Signed)
[facility] Follow up:    Justin [name], NP [place] [id number]   DIAGNOSIS:  Cancer Staging  Cancer of tonsillar fossa (Justin Baldwin) Staging form: Pharynx - HPV-Mediated Oropharynx, AJCC 8th Edition - Clinical stage from 10/01/2020: Stage II (cT3, cN2, cM0, p16+) - Signed by Eppie Gibson, MD on 02/11/2021 Stage prefix: Initial diagnosis   SUMMARY OF ONCOLOGIC HISTORY: Oncology History  Cancer of tonsillar fossa (Justin Baldwin)  08/2020 Initial Diagnosis   Justin Baldwin is a 46 y.o. male who presented with ongoing head cold symptoms, sinus issues, left ear pain, and left-sided throat pain since September.  When the cold-like symptoms resolved, the throat and ear pain did not. Augmentin was prescribed without relief.   09/19/2020 Initial Biopsy   Subsequently, the patient saw Dr. Redmond Baseman, ENT at [facility], who noted that the left tonsil had a mass appearance on 09/19/2020. 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   09/30/2020 Imaging   Pertinent imaging thus far includes soft tissue neck CT scan on 09/30/2020 revealed a left tonsillar mass abutting the glossotonsillar sulcus and base of the tongue, consistent with known malignancy. Left level 2/3 cystic or necrotic adenopathy reflected nodal metastasis.   CT scan of chest performed on 09/30/2020 revealing a non-specific left lower lobe pulmonary micronodule but no acute intrathoracic abnormality and no definite intrathoracic metastasis.  I have personally reviewed his imaging and discussed him with Dr. Redmond Baseman.     10/01/2020 Cancer Staging   Staging form: Pharynx - HPV-Mediated Oropharynx, AJCC 8th Edition - Clinical stage from 10/01/2020: Stage II (cT3, cN2, cM0, p16+) - Signed by Eppie Gibson, MD on 02/11/2021 Stage prefix: Initial diagnosis   10/10/2020 PET scan   He had a PET scan which showed hypermetabolic left tonsillar mass and bilateral lymphadenopathy  consistent with metastatic disease.  Hence recommendation was to proceed with concurrent chemoradiation.   10/28/2020 - 12/15/2020 Radiation Therapy   10/28/2020 through 12/15/2020 Site Technique Total Dose (Gy) Dose per Fx (Gy) Completed Fx Beam Energies  Tonsil, Left: HN_Lt_tonsil IMRT 70/70 2 35/35 6X    11/07/2020 - 12/03/2020 Chemotherapy   Completed 2 cycles of cisplatin at 100 mg/m2. (Chemo start delayed secondary to him having COVID19)       04/10/2021 PET scan   1. Interval resolution of previous FDG uptake associated with left tonsillar mass and FDG avid left level 2/3 cervical lymph node. No new sites of disease identified. 2. New long segment of increased uptake localizing to the esophagus. Correlate for any clinical signs or symptoms of esophagitis. 3.  Aortic Atherosclerosis (ICD10-I70.0).        CURRENT THERAPY: Observation  INTERVAL HISTORY: Justin Baldwin 46 y.o. male returns for f/u regarding his recent ER visit for pelvic pain.  He underwent CT pelvis during his ER visit on 7/16 that demonstrated:  IMPRESSION: 1. No acute fracture or dislocation. 2. Moderate arthritic changes of the right hip. 3. Lucent lesions in the right femoral neck and distal sacrum. Further characterization with MRI on a nonemergent/outpatient basis recommended.  He today reports no significant pain.  It just feels sore in his tailbone.  He never had any pain in his groin.  He also recently had some cortisone shot to his right hip for arthritis.  He denies any other complaints.  He has recovered completely from chemoradiation except for some dysgeusia for certain foods. Rest of the pertinent 10 point  ROS reviewed and negative  Patient Active Problem List   Diagnosis Date Noted   Chronic pain of right hip 05/15/2021   Lymphedema 02/06/2021   Drug induced constipation 01/13/2021   Mucositis due to antineoplastic therapy 01/12/2021   Weight loss, unintentional 01/12/2021   Cancer of tonsillar  fossa (Justin Baldwin) 10/01/2020   Type 2 diabetes mellitus ([place]) 09/03/2019   Gastroesophageal reflux disease 03/06/2019   Atherosclerosis of aorta ([place]) 11/30/2018   Medication monitoring encounter 10/31/2018   Atypical mycobacterium infection    Abscess of buttock, left 09/22/2018   Hydradenitis 09/22/2018   Kidney insufficiency 09/22/2018   Lump 08/31/2018   Deep tissue injury 08/25/2018   Skin rash 07/25/2018   Fatigue 11/08/2017   Vertigo 07/11/2017   Preventative health care 03/29/2016   Insomnia 03/15/2016   Erectile dysfunction 03/15/2016    has No Known Allergies.  MEDICAL HISTORY: Past Medical History:  Diagnosis Date   Diabetes mellitus without complication ([place])    Dizziness 11/30/2018   Erectile dysfunction    Insomnia    Skin abnormality    hidradenitis suppurativa    Throat cancer ([place])     SURGICAL HISTORY: Past Surgical History:  Procedure Laterality Date   IR GASTROSTOMY TUBE MOD SED  11/21/2020   IR GASTROSTOMY TUBE REMOVAL  02/27/2021   IR IMAGING GUIDED PORT INSERTION  11/21/2020   IR REMOVAL TUN ACCESS W/ PORT W/O FL MOD SED  06/05/2021   IRRIGATION AND DEBRIDEMENT ABSCESS N/A 09/22/2018   Procedure: IRRIGATION AND DEBRIDEMENT ABSCESS;  Surgeon: Ralene Ok, MD;  Location: [facility];  Service: General;  Laterality: N/A;    SOCIAL HISTORY: Social History   Socioeconomic History   Marital status: Married    Spouse name: Not on file   Number of children: Not on file   Years of education: Not on file   Highest education level: Not on file  Occupational History   Not on file  Tobacco Use   Smoking status: Every Day    Packs/day: 1.00    Types: Cigarettes    Last attempt to quit: 10/27/2020    Years since quitting: 1.5   Smokeless tobacco: Former    Quit date: 02/14/2016  Vaping Use   Vaping Use: Never used  Substance and Sexual Activity   Alcohol use: Yes    Alcohol/week: 5.0 standard drinks of alcohol    Types: 5 Shots of liquor per week     Comment: occasinal    Drug use: No   Sexual activity: Yes  Other Topics Concern   Not on file  Social History Narrative   ** Merged History Encounter **       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    Social Determinants of Health   Financial Resource Strain: Low Risk  (10/01/2020)   Overall Financial Resource Strain (CARDIA)    Difficulty of Paying Living Expenses: Not hard at all  Food Insecurity: No Food Insecurity (10/01/2020)   Hunger Vital Sign    Worried About Running Out of Food in the Last Year: Never true    Ran Out of Food in the Last Year: Never true  Transportation Needs: No Transportation Needs (10/01/2020)   PRAPARE - Hydrologist (Medical): No    Lack of Transportation (Non-Medical): No  Physical Activity: Not on file  Stress: Not on file  Social Connections: Not on file  Intimate Partner Violence: Not on file  FAMILY HISTORY: Family History  Problem Relation Age of Onset   Heart attack Father    Emphysema Paternal Grandmother    Arthritis Mother     Review of Systems  Constitutional:  Negative for appetite change, chills, fatigue, fever and unexpected weight change.  HENT:   Negative for hearing loss, lump/mass and trouble swallowing.   Eyes:  Negative for eye problems and icterus.  Respiratory:  Negative for chest tightness, cough and shortness of breath.   Cardiovascular:  Negative for chest pain, leg swelling and palpitations.  Gastrointestinal:  Negative for abdominal distention, abdominal pain, constipation, diarrhea, nausea and vomiting.  Endocrine: Negative for hot flashes.  Genitourinary:  Negative for difficulty urinating.   Musculoskeletal:  Negative for arthralgias.  Skin:  Negative for itching and rash.  Neurological:  Negative for dizziness, extremity weakness, headaches and numbness.  Hematological:  Negative for adenopathy. Does not bruise/bleed easily.   Psychiatric/Behavioral:  Negative for depression. The patient is not nervous/anxious.       PHYSICAL EXAMINATION  ECOG PERFORMANCE STATUS: 0 - Asymptomatic  Vitals:   05/21/22 0948  BP: 137/68  Pulse: 75  Resp: 16  Temp: 97.9 F (36.6 C)  SpO2: 96%    Physical Exam Constitutional:      Appearance: Normal appearance.  HENT:     Head: Normocephalic and atraumatic.     Mouth/Throat:     Mouth: Mucous membranes are moist.     Comments: No tonsillar masses noted on exam Pulmonary:     Effort: Pulmonary effort is normal.     Breath sounds: Normal breath sounds.  Musculoskeletal:     Cervical back: Normal range of motion and neck supple. No rigidity.  Lymphadenopathy:     Cervical: No cervical adenopathy.  Skin:    General: Skin is warm and dry.  Neurological:     General: No focal deficit present.     Mental Status: He is alert.  Psychiatric:        Mood and Affect: Mood normal.      LABORATORY DATA:  CBC    Component Value Date/Time   WBC 6.7 05/29/2021 1937   RBC 4.70 05/29/2021 1937   HGB 15.1 05/29/2021 1937   HGB 13.5 12/11/2020 0816   HCT 45.1 05/29/2021 1937   PLT 216 05/29/2021 1937   PLT 190 12/11/2020 0816   MCV 96.0 05/29/2021 1937   MCH 32.1 05/29/2021 1937   MCHC 33.5 05/29/2021 1937   RDW 13.1 05/29/2021 1937   LYMPHSABS 1.1 05/29/2021 1937   MONOABS 0.7 05/29/2021 1937   EOSABS 0.1 05/29/2021 1937   BASOSABS 0.1 05/29/2021 1937    CMP     Component Value Date/Time   NA 134 (L) 06/23/2021 0813   NA 135 09/12/2019 1048   K 5.2 (H) 06/23/2021 0813   CL 98 06/23/2021 0813   CO2 32 06/23/2021 0813   GLUCOSE 97 06/23/2021 0813   BUN 20 06/23/2021 0813   BUN 18 09/12/2019 1048   CREATININE 1.56 (H) 06/23/2021 0813   CREATININE 1.28 (H) 12/11/2020 0816   CREATININE 1.38 (H) 11/30/2018 1102   CALCIUM 9.2 06/23/2021 0813   PROT 5.8 (L) 05/29/2021 1937   ALBUMIN 3.5 05/29/2021 1937   AST 20 05/29/2021 1937   ALT 23 05/29/2021 1937    ALKPHOS 26 (L) 05/29/2021 1937   BILITOT 0.6 05/29/2021 1937   GFRNONAA >60 05/29/2021 1937   GFRNONAA >60 12/11/2020 0816   GFRNONAA 63 11/30/2018 1102   GFRAA  72 09/12/2019 1048   GFRAA 73 11/30/2018 1102        ASSESSMENT and THERAPY PLAN:   Cancer of tonsillar fossa ([place]) Peder is a 46 year old man with h/o 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  Most recent PET scan in 03/2021 shows resolution of FDG uptake.    He has had new hip and tailbone pain, and CT pelvis indeterminate.  He has not had any other symptoms, however upon presentation his symptoms were mild throat pain.    He had a PET/CT scheduled today and is here for follow-up.  At this time his pain has resolved and it does not appear to be related to his head and neck cancer.  We will however review PET results and let him know if he needs any further testing.  With regards to ENT surveillance, I encouraged him to call Dr. Redmond Baseman and set up follow-up.  He will also do labs today CBC and TSH to monitor acquired hypothyroidism from treatment.  He should otherwise return to clinic in 1 year or sooner based on the PET/CT results.  All questions were answered. The patient knows to call the clinic with any problems, questions or concerns. We can certainly see the patient much sooner if necessary.  Total encounter time:30 minutes*in face-to-face visit time, chart review, lab review, care coordination, order entry, and documentation of the encounter time.  *Total Encounter Time as defined by the Centers for Medicare and Medicaid Services includes, in addition to the face-to-face time of a patient visit (documented in the note above) non-face-to-face time: obtaining and reviewing outside history, ordering and reviewing medications, tests or procedures, care coordination (communications with other health care professionals or caregivers) and documentation in the medical  record.

## 2022-05-22 ENCOUNTER — Encounter: Payer: Self-pay | Admitting: Adult Health

## 2022-05-22 ENCOUNTER — Encounter: Payer: Self-pay | Admitting: Hematology and Oncology

## 2022-05-24 ENCOUNTER — Other Ambulatory Visit: Payer: Self-pay | Admitting: Hematology and Oncology

## 2022-05-24 ENCOUNTER — Encounter: Payer: Self-pay | Admitting: *Deleted

## 2022-05-24 ENCOUNTER — Telehealth: Payer: Self-pay | Admitting: *Deleted

## 2022-05-24 ENCOUNTER — Other Ambulatory Visit: Payer: Self-pay | Admitting: *Deleted

## 2022-05-24 DIAGNOSIS — C7951 Secondary malignant neoplasm of bone: Secondary | ICD-10-CM

## 2022-05-24 DIAGNOSIS — R948 Abnormal results of function studies of other organs and systems: Secondary | ICD-10-CM

## 2022-05-24 DIAGNOSIS — C090 Malignant neoplasm of tonsillar fossa: Secondary | ICD-10-CM

## 2022-05-24 NOTE — Telephone Encounter (Addendum)
This RN contacted [facility] per need for fixation of Right femur due to new area of  disease. Pt had PET scan 05/21/2022 showing new area of mass in pelvis and right femur with concern for pending fracture  Pt has hx of HPV mediated tonsillar cancer.  Pt requested Dr Lyla Glassing.  Per call information for fixation with possible bx discussed with Heather in scheduling. Pt demographics given as well as contact number for Dr Chryl Heck.  Nira Conn is sending information to Dr Lyla Glassing for review and appropriate scheduling.  This RN's number given for Heather to return call.

## 2022-05-25 ENCOUNTER — Ambulatory Visit (HOSPITAL_COMMUNITY)
Admission: RE | Admit: 2022-05-25 | Discharge: 2022-05-25 | Disposition: A | Discharge status: Home / Self Care | Payer: BC Managed Care – PPO | Source: Ambulatory Visit | Attending: Hematology and Oncology | Admitting: Hematology and Oncology

## 2022-05-25 ENCOUNTER — Telehealth: Payer: Self-pay

## 2022-05-25 DIAGNOSIS — C419 Malignant neoplasm of bone and articular cartilage, unspecified: Secondary | ICD-10-CM | POA: Diagnosis not present

## 2022-05-25 DIAGNOSIS — C090 Malignant neoplasm of tonsillar fossa: Secondary | ICD-10-CM | POA: Diagnosis not present

## 2022-05-25 MED ORDER — GADOBUTROL 1 MMOL/ML IV SOLN
9.0000 mL | Freq: Once | INTRAVENOUS | Status: AC | PRN
  Administered 2022-05-25: 9 mL via INTRAVENOUS

## 2022-05-25 NOTE — Progress Notes (Signed)
Histology and Location of Primary Cancer:  Squamous cell carcinoma of the oropharynx, p16(+)  Sites of Visceral and Bony Metastatic Disease:  PET Scan 05/21/2022 --IMPRESSION: Suspicion of multifocal osseous metastatic disease as described, primarily in the pelvis. There is a lytic sacral lesion associated with a hypermetabolic presacral soft tissue mass which should be amenable to percutaneous biopsy. Lesion in the intertrochanteric region of the right femur may predispose to pathologic fracture. Pelvic MRI may be helpful for further evaluation. No evidence of local recurrence of head and neck cancer or nodal disease in the neck or chest.  Location(s) of Symptomatic Metastases: *** Right hip  Past/Anticipated chemotherapy by medical oncology, if any: ***  Pain on a scale of 0-10 is: ***    If Spine Met(s), symptoms, if any, include: Bowel/Bladder retention or incontinence (please describe): *** Numbness or weakness in extremities (please describe): *** Current Decadron regimen, if applicable: N/A  Ambulatory status? Walker? Wheelchair?: ***  SAFETY ISSUES: Prior radiation? Yes: 10/28/2020 through 12/15/2020 Site Technique Total Dose (Gy) Dose per Fx (Gy) Completed Fx Beam Energies  Tonsil, Left: HN_Lt_tonsil IMRT 70/70 2 35/35 6X   Pacemaker/ICD? No Possible current pregnancy? N/A Is the patient on methotrexate? No  Current Complaints / other details:  ***Will have MRI or hip, after which orthopedic surgeon Dr.Brian Swinteck plans to do a right total hip arthroplasty

## 2022-05-25 NOTE — Progress Notes (Signed)
Radiation Oncology         [phone] ________________________________  Outpatient Re-Consultation  Name: Justin Baldwin MRN: [id number]  Date: 05/26/2022  DOB: [birth year]  YW:VPXTG, Leticia Penna, NP  Benay Pike, MD   REFERRING PHYSICIAN: Benay Pike, MD  DIAGNOSIS:    ICD-10-CM   1. Malignant neoplasm metastatic to bone [facility])  C79.51     2. Cancer of tonsillar fossa (HCC)  C09.0       New painful lesions to the right femur and sacrum   History of stage II p16+ tonsillar squamous cell carcinoma diagnosed in 08/2020 treated with concurrent chemoradiation   Cancer Staging  Cancer of tonsillar fossa ([place]) Staging form: Pharynx - HPV-Mediated Oropharynx, AJCC 8th Edition - Clinical stage from 10/01/2020: Stage II (cT3, cN2, cM0, p16+) - Signed by Eppie Gibson, MD on 02/11/2021 Now with likely stage IV metastatic disease  CHIEF COMPLAINT: Here to discuss management of bone lesions  Interval since last radiation: 1 year, 5 months, and 12 days   Radiation Treatment Dates: 10/28/2020 through 12/15/2020 Site Technique Total Dose (Gy) Dose per Fx (Gy) Completed Fx Beam Energies  Tonsil, Left: HN_Lt_tonsil IMRT 70/70 2 35/35 6X    HISTORY OF PRESENT ILLNESS::Justin Baldwin is a 46 y.o. male who returns today for re-evaluation for consideration of radiation therapy in management of new bone lesions. I last met with the patient for follow-up on 05/12/22. Since then, he has had no evidence of disease until his recent history detailed below.   The patient recently presented to the ED on 05/08/22 with severe right sacral pain. (To review: the patient has chronic right hip pain/arthritis). A few days prior to ED presentation, the patient received a steroid injection to his hip on 04/30/22. He was doing better but starting 1 day before her presented to the ED, he began to have severe pain in his sacrum that did not improve with his home APAP and Mobic.  CT of the pelvis performed in the ED  on 05/09/22 revealed new lucent lesions in the right femoral neck and distal sacrum. Specifically, a 3.3 cm lucent lesion in the right femoral neck with extension through the cortex was noted. CT otherwise showed no evidence of acute fracture or dislocation.  Restaging PET scan on 05/21/22 redemonstrated: the new hypermetabolic lytic lesion in the distal sacrum with an associated presacral soft tissue mass; the hypermetabolic lytic lesion anteriorly in the intertrochanteric region of the proximal right femur (SUV max 5.9); and a hypermetabolic lytic lesion within the left ischium (SUV max 6.1). A possible hypermetabolic lesion was also noted within the left anterior arch of C1 (SUV max 5.1). Otherwise, PET showed no evidence of local recurrence of head and neck cancer or nodal disease in the neck or chest.  The patient was instructed to present to the ED last night for an MRI of the right femur. Results are pending at this time.  Of note: The patient recently met with Dr. Chryl Heck on 05/21/22 who also encouraged him to call Dr. Redmond Baseman to set up follow-up. Given PET results, the patient will return to Dr. Chryl Heck in the near future for further discussion.   He sees Dr. Lyla Glassing this Friday.  I have personally reached out to Dr. Lyla Glassing who anticipates hip surgery in the form of right total hip arthroplasty in the near future.  The patient reports that his pain has improved since his episode in the emergency room.  He actually does not have any further  sacral or pelvic pain but does have some residual hip pain.  He ambulates unassisted.   PAST MEDICAL HISTORY:  has a past medical history of Diabetes mellitus without complication ([place]), Dizziness (11/30/2018), Erectile dysfunction, Insomnia, Skin abnormality, and Throat cancer ([place]).    PAST SURGICAL HISTORY: Past Surgical History:  Procedure Laterality Date   IR GASTROSTOMY TUBE MOD SED  11/21/2020   IR GASTROSTOMY TUBE REMOVAL  02/27/2021   IR IMAGING GUIDED  PORT INSERTION  11/21/2020   IR REMOVAL TUN ACCESS W/ PORT W/O FL MOD SED  06/05/2021   IRRIGATION AND DEBRIDEMENT ABSCESS N/A 09/22/2018   Procedure: IRRIGATION AND DEBRIDEMENT ABSCESS;  Surgeon: Ralene Ok, MD;  Location: [facility];  Service: General;  Laterality: N/A;    FAMILY HISTORY: family history includes Arthritis in his mother; Emphysema in his paternal grandmother; Heart attack in his father.  SOCIAL HISTORY:  reports that he has been smoking cigarettes. He has been smoking an average of 1 pack per day. He quit smokeless tobacco use about 6 years ago. He reports current alcohol use of about 5.0 standard drinks of alcohol per week. He reports that he does not use drugs.  ALLERGIES: Patient has no known allergies.  MEDICATIONS:  Current Outpatient Medications  Medication Sig Dispense Refill   anastrozole (ARIMIDEX) 1 MG tablet Take 1 mg by mouth once a week.     famotidine (PEPCID) 20 MG tablet TAKE 1 TABLET BY MOUTH ONCE DAILY For heartburn 90 tablet 2   magnesium oxide (MAG-OX) 400 (241.3 Mg) MG tablet Take 1 tablet (400 mg total) by mouth 2 (two) times daily. (Patient taking differently: Take 400 mg by mouth daily.) 20 tablet 0   meloxicam (MOBIC) 15 MG tablet Take 1 tablet (15 mg total) by mouth daily as needed for pain. 30 tablet 5   Omega-3 Fatty Acids (FISH OIL PO) Take 1 capsule by mouth daily.     omeprazole (PRILOSEC) 20 MG capsule Take 20 mg by mouth daily.     oxyCODONE-acetaminophen (PERCOCET/ROXICET) 5-325 MG tablet Take 1 tablet by mouth every 6 (six) hours as needed for severe pain. 20 tablet 0   oxymetazoline (AFRIN) 0.05 % nasal spray Place 1-2 sprays into both nostrils as needed for congestion.      rosuvastatin (CRESTOR) 40 MG tablet Take 1 tablet (40 mg total) by mouth daily. For cholesterol. (Patient not taking: Reported on 05/28/2022) 90 tablet 3   tadalafil (CIALIS) 5 MG tablet Take 1 tablet (5 mg total) by mouth daily as needed for erectile dysfunction.  (Patient not taking: Reported on 05/28/2022) 90 tablet 0   TURMERIC PO Take 1 capsule by mouth daily.     No current facility-administered medications for this encounter.    REVIEW OF SYSTEMS:  Notable for that above.   PHYSICAL EXAM:  height is 6' (1.829 m) and weight is 205 lb 12.8 oz (93.4 kg). His temperature is 97.3 F (36.3 C) (abnormal). His blood pressure is 146/89 (abnormal) and his pulse is 58 (abnormal). His respiration is 18 and oxygen saturation is 100%.   General: Alert and oriented, in no acute distress HEENT: Head is normocephalic. Extraocular movements are intact.   Heart: Regular in rate and rhythm with no murmurs, rubs, or gallops. Chest: Clear to auscultation bilaterally, with no rhonchi, wheezes, or rales. Extremities: No cyanosis or edema. Skin: Diffuse tattoos Musculoskeletal: Good muscle tone and strength throughout.  Ambulatory Neurologic: Cranial nerves II through XII are grossly intact. No obvious focalities. Speech  is fluent. Coordination is intact. Psychiatric: Judgment and insight are intact. Affect is appropriate.   ECOG = 1  0 - Asymptomatic (Fully active, able to carry on all predisease activities without restriction)  1 - Symptomatic but completely ambulatory (Restricted in physically strenuous activity but ambulatory and able to carry out work of a light or sedentary nature. For example, light housework, office work)  2 - Symptomatic, <50% in bed during the day (Ambulatory and capable of all self care but unable to carry out any work activities. Up and about more than 50% of waking hours)  3 - Symptomatic, >50% in bed, but not bedbound (Capable of only limited self-care, confined to bed or chair 50% or more of waking hours)  4 - Bedbound (Completely disabled. Cannot carry on any self-care. Totally confined to bed or chair)  5 - Death   Eustace Pen MM, Creech RH, Tormey DC, et al. [phone]). "Toxicity and response criteria of the [facility]  [employer]". [place] Oncol. 5 (6): 649-55   LABORATORY DATA:  Lab Results  Component Value Date   WBC 8.2 05/21/2022   HGB 15.9 05/21/2022   HCT 46.9 05/21/2022   MCV 94.7 05/21/2022   PLT 248 05/21/2022   CMP     Component Value Date/Time   NA 134 (L) 06/23/2021 0813   NA 135 09/12/2019 1048   K 5.2 (H) 06/23/2021 0813   CL 98 06/23/2021 0813   CO2 32 06/23/2021 0813   GLUCOSE 97 06/23/2021 0813   BUN 20 06/23/2021 0813   BUN 18 09/12/2019 1048   CREATININE 1.56 (H) 06/23/2021 0813   CREATININE 1.28 (H) 12/11/2020 0816   CREATININE 1.38 (H) 11/30/2018 1102   CALCIUM 9.2 06/23/2021 0813   PROT 5.8 (L) 05/29/2021 1937   ALBUMIN 3.5 05/29/2021 1937   AST 20 05/29/2021 1937   ALT 23 05/29/2021 1937   ALKPHOS 26 (L) 05/29/2021 1937   BILITOT 0.6 05/29/2021 1937   GFRNONAA >60 05/29/2021 1937   GFRNONAA >60 12/11/2020 0816   GFRNONAA 63 11/30/2018 1102   GFRAA 72 09/12/2019 1048   GFRAA 73 11/30/2018 1102      Lab Results  Component Value Date   TSH 1.781 05/21/2022     RADIOGRAPHY: MR FEMUR RIGHT W WO CONTRAST  Result Date: 05/26/2022 CLINICAL DATA:  Bone neoplasm, femur, recurrence suspected Concern for metastatic SCC of H and N, right femur lesion noted PET scan EXAM: MRI OF THE RIGHT FEMUR WITHOUT AND WITH CONTRAST TECHNIQUE: Multiplanar, multisequence MR imaging of the right femur was performed both before and after administration of intravenous contrast. CONTRAST:  70m GADAVIST GADOBUTROL 1 MMOL/ML IV SOLN COMPARISON:  PET-CT 05/21/2022. FINDINGS: Bones/Joint/Cartilage Within the intertrochanteric right proximal femur, there is a T2 hyperintense/T1 hypointense mass with cortical breakthrough and enhancement. The lesion measures approximately 3.5 x 3.4 x 3.5 cm (series 12 image 25, series 14 image 15) and involves 70% of the intertrochanteric femur width. This has a hypointense rim. There is intense adjacent edema and enhancement in the inter trochanteric and  subtrochanteric bone. There is cortical breakthrough anteriorly. There is adjacent periostitis and cortical thinning. This corresponds to the hypermetabolic lesion on recent PET-CT. There is a lesion in the lower sacrum involving the sacral canal, which corresponds to a hypermetabolic lesion on recent PET-CT, measuring 4.4 x 3.2 cm (series 12, image 2). There is cortical breakthrough anteriorly with adjacent soft tissue mass. On large field-of-view images, there is a partially visualized T1 hypointense/T2  hyperintense lesion with adjacent cortical thinning in the left inferior pubic ramus/ischial tuberosity measuring 2.3 cm (series 5 image 14), also corresponding to a hypermetabolic lesion on recent PET-CT. There is moderate to severe right hip osteoarthritis with cam deformity. Muscles and Tendons There is edema signal and enhancement within the adjacent vastus lateralis origin. No other significant muscle edema or muscle atrophy. No intramuscular collection. Soft tissues No focal fluid collection.  No other soft tissue mass. IMPRESSION: Aggressive 3.5 cm lesion in the intertrochanteric right proximal femur, with cortical breakthrough anteriorly, periostitis, and associated edema and enhancement in the intertrochanteric and subtrochanteric bone. This lesion involves 70% of intertrochanteric femur width, at high risk for pathologic fracture. Additional similar appearing lesions in the sacrum and left inferior pubic ramus/ischial tuberosity as described. Findings are highly concerning for metastatic disease. Electronically Signed   By: Maurine Simmering M.D.   On: 05/26/2022 07:16   NM PET Image Restag (PS) Skull Base To Thigh  Result Date: 05/21/2022 CLINICAL DATA:  Subsequent treatment strategy for head and neck cancer (left tonsillar squamous cell carcinoma). Chronic right hip pain with previous steroid injections. EXAM: NUCLEAR MEDICINE PET SKULL BASE TO THIGH TECHNIQUE: 10.33 mCi F-18 FDG was injected  intravenously. Full-ring PET imaging was performed from the skull base to thigh after the radiotracer. CT data was obtained and used for attenuation correction and anatomic localization. Fasting blood glucose: 93 mg/dl COMPARISON:  PET-CT 04/10/2021. Neck CTA 05/29/2021. Pelvic CT 05/09/2022. FINDINGS: Mediastinal blood pool activity: SUV max 2.3 NECK: No recurrent hypermetabolic cervical lymph nodes are identified. There are no recurrent lesions of the pharyngeal mucosal space. Incidental CT findings: none CHEST: There are no hypermetabolic mediastinal, hilar or axillary lymph nodes. No hypermetabolic pulmonary activity or suspicious nodularity. Incidental CT findings: Azygous fissure noted. Previously demonstrated hypermetabolic activity within the thoracic esophagus has improved, likely related to esophagitis or reflux. ABDOMEN/PELVIS: There is no hypermetabolic activity within the liver, adrenal glands, spleen or pancreas. There is no hypermetabolic nodal activity. There is a new intensely hypermetabolic soft tissue mass anterior to the lower sacrum, measuring 3.6 x 1.8 cm on image 185/4. This has an SUV max of 10.9 and is associated with sacral bone destruction and osseous hypermetabolic activity. Incidental CT findings: none SKELETON: As above, new hypermetabolic lytic lesion in the distal sacrum with an associated presacral soft tissue mass. In addition, there is a hypermetabolic lytic lesion anteriorly in the intertrochanteric region of the proximal right femur (SUV max 5.9) and a hypermetabolic lytic lesion within the left ischium (SUV max 6.1). There is a possible hypermetabolic lesion within the left anterior arch of C1 (SUV max 5.1). Incidental CT findings: none IMPRESSION: 1. Suspicion of multifocal osseous metastatic disease as described, primarily in the pelvis. There is a lytic sacral lesion associated with a hypermetabolic presacral soft tissue mass which should be amenable to percutaneous biopsy.  Lesion in the intertrochanteric region of the right femur may predispose to pathologic fracture. Pelvic MRI may be helpful for further evaluation. 2. No evidence of local recurrence of head and neck cancer or nodal disease in the neck or chest. Electronically Signed   By: Richardean Sale M.D.   On: 05/21/2022 17:21   CT PELVIS WO CONTRAST  Result Date: 05/09/2022 CLINICAL DATA:  Right hip pain. EXAM: CT PELVIS WITHOUT CONTRAST TECHNIQUE: Multidetector CT imaging of the pelvis was performed following the standard protocol without intravenous contrast. RADIATION DOSE REDUCTION: This exam was performed according to the departmental dose-optimization program which  includes automated exposure control, adjustment of the mA and/or kV according to patient size and/or use of iterative reconstruction technique. COMPARISON:  CT abdomen pelvis dated 11/29 9 and right hip radiograph dated 05/18/2021. FINDINGS: Evaluation of this exam is limited in the absence of intravenous contrast as well as due to respiratory motion. Urinary Tract:  No abnormality visualized. Bowel:  Unremarkable visualized pelvic bowel loops. Vascular/Lymphatic: No pathologically enlarged lymph nodes. No significant vascular abnormality seen. Reproductive: The prostate and seminal vesicles are grossly unremarkable. Other:  None Musculoskeletal: There is a 3.3 cm lucent lesion in the right femoral neck with extension through the cortex. This is not characterized on this CT and may be related to osteopenia or sequela of prior infection. Other etiologies are not excluded. Further characterization with MRI on a nonemergent/outpatient basis recommended. There is no acute fracture or dislocation. There is moderate arthritic changes of the right hip. Additional lucent lesion in the distal sacrum (sagittal 100/7) indeterminate and new since the CT of 09/22/2018. IMPRESSION: 1. No acute fracture or dislocation. 2. Moderate arthritic changes of the right hip. 3.  Lucent lesions in the right femoral neck and distal sacrum. Further characterization with MRI on a nonemergent/outpatient basis recommended. Electronically Signed   By: Anner Crete M.D.   On: 05/09/2022 01:04   DG FLUORO GUIDED NEEDLE PLC ASPIRATION/INJECTION LOC  Result Date: 04/30/2022 CLINICAL DATA:  Chronic right hip pain. Osteoarthritis. Good response to the previous intra-articular steroid injection in 07/2021. EXAM: RIGHT HIP INJECTION UNDER FLUOROSCOPY COMPARISON:  Right hip injection 08/14/2021. FLUOROSCOPY: Radiation Exposure Index (as provided by the fluoroscopic device): 0.80 mGy Kerma PROCEDURE: The overlying skin was prepped with Betadine, draped in the usual sterile fashion, and infiltrated locally with 1% lidocaine. A 3.5 inch 22 gauge spinal needle was advanced to the lateral aspect of the right femoral head-neck junction. 1 mL of 1% lidocaine injected easily. Diagnostic injection of a small amount of Isovue-M 200 demonstrated intra-articular spread without intravascular component. 80 mg of Depo-Medrol and 4 mL of 0.25% bupivacaine were then administered. The needle was removed and a sterile dressing was applied. There was no immediate complication. IMPRESSION: Technically successful right hip injection under fluoroscopy. Electronically Signed   By: Logan Bores M.D.   On: 04/30/2022 08:48      MPRESSION/PLAN: Metastatic disease to bone, likely from oropharyngeal primary  Today, I talked to the patient about the findings and work-up thus far.  We discussed the patient's diagnosis of oropharyngeal cancer with subsequent bone lesions and presacral soft tissue lesion as evidenced by recent imaging.  He understands that this is highly concerning for metastatic cancer and that if he does have metastatic disease that probably originated from his throat cancer.  He understands that I talked to orthopedics and they plan to sample the abnormal tissue from his right hip when surgery is performed  so that special test can be performed to determine if this is in fact cancer and discern what type of cancer it is.  Testing will help determine the best drugs to fight the cancer as well.   We discussed the role of postoperative radiation therapy to the right hip and femur to prevent local progression after surgery.  We discussed the available radiation techniques, and focused on the details of logistics and delivery.   He anticipates getting 2 weeks of radiation therapy once he is healed from surgery adequately.  We will not treat the other bone lesions unless he becomes significantly symptomatic from them  or they progress despite drug therapy.  We discussed the risks, benefits, and side effects of radiotherapy. Side effects may include but not necessarily be limited to: Skin irritation and fatigue.  Internal organ and soft tissue or bone injury is rare.  No guarantees of treatment were given. A consent form was signed and placed in the patient's medical record. The patient was encouraged to ask questions that I answered to the best of my ability.    I have gotten in touch with the patient's oncologic team including Dr. Lyla Glassing and Dr. Chryl Heck.  I will await referral back for treatment planning.  The patient knows to call us if he believes he needs an appointment and has not gotten a call from [place].  We spoke for some time about his presumed diagnosis and prognostic implications.  He expressed gratitude for our discussion today.  He understands that his prognosis would be guarded if he has metastatic disease but that the disease would be treatable and that there is hope that it could be controlled for some time with effective drugs.  I will refer him to palliative care for an additional layer of support.  He is receptive to this.      On date of service, in total, I spent 45 minutes on this encounter. Patient was seen in person.  This note was signed after the date of encounter.  Minutes pertaining to date  of encounter only..   __________________________________________   Eppie Gibson, MD  This document serves as a record of services personally performed by Eppie Gibson, MD. It was created on her behalf by Roney Mans, a trained medical scribe. The creation of this record is based on the scribe's personal observations and the provider's statements to them. This document has been checked and approved by the attending provider.

## 2022-05-25 NOTE — Telephone Encounter (Signed)
Spoke with director of [facility] ED MRI who was able to move pt's MRI to tonight at 2100. Pt is aware and knows he should arrive at 2030 for check in at [facility] ED as admitting is not here at that time. Pt verbalized thanks and understanding. He knows to call with any further concerns.

## 2022-05-26 ENCOUNTER — Other Ambulatory Visit: Payer: Self-pay

## 2022-05-26 ENCOUNTER — Ambulatory Visit
Admission: RE | Admit: 2022-05-26 | Discharge: 2022-05-26 | Disposition: A | Discharge status: Home / Self Care | Payer: BC Managed Care – PPO | Source: Ambulatory Visit | Attending: Radiation Oncology | Admitting: Radiation Oncology

## 2022-05-26 VITALS — BP 146/89 | HR 58 | Temp 97.3°F | Resp 18 | Ht 72.0 in | Wt 205.8 lb

## 2022-05-26 DIAGNOSIS — C7951 Secondary malignant neoplasm of bone: Secondary | ICD-10-CM | POA: Diagnosis not present

## 2022-05-26 DIAGNOSIS — M1611 Unilateral primary osteoarthritis, right hip: Secondary | ICD-10-CM | POA: Diagnosis not present

## 2022-05-26 DIAGNOSIS — Z79899 Other long term (current) drug therapy: Secondary | ICD-10-CM | POA: Diagnosis not present

## 2022-05-26 DIAGNOSIS — Z923 Personal history of irradiation: Secondary | ICD-10-CM | POA: Diagnosis not present

## 2022-05-26 DIAGNOSIS — F17210 Nicotine dependence, cigarettes, uncomplicated: Secondary | ICD-10-CM | POA: Diagnosis not present

## 2022-05-26 DIAGNOSIS — G8929 Other chronic pain: Secondary | ICD-10-CM | POA: Insufficient documentation

## 2022-05-26 DIAGNOSIS — M84451A Pathological fracture, right femur, initial encounter for fracture: Secondary | ICD-10-CM | POA: Diagnosis not present

## 2022-05-26 DIAGNOSIS — C090 Malignant neoplasm of tonsillar fossa: Secondary | ICD-10-CM

## 2022-05-26 DIAGNOSIS — R609 Edema, unspecified: Secondary | ICD-10-CM | POA: Insufficient documentation

## 2022-05-26 DIAGNOSIS — E119 Type 2 diabetes mellitus without complications: Secondary | ICD-10-CM | POA: Insufficient documentation

## 2022-05-27 ENCOUNTER — Telehealth: Payer: Self-pay

## 2022-05-27 ENCOUNTER — Ambulatory Visit: Payer: BC Managed Care – PPO

## 2022-05-27 ENCOUNTER — Ambulatory Visit: Payer: BC Managed Care – PPO | Admitting: Radiation Oncology

## 2022-05-27 NOTE — Telephone Encounter (Signed)
Attempted to contact patient to schedule a Palliative Care consult appointment. No answer and unable to leave a voicemail.  °

## 2022-05-28 ENCOUNTER — Encounter: Payer: Self-pay | Admitting: Radiation Oncology

## 2022-05-28 DIAGNOSIS — M1611 Unilateral primary osteoarthritis, right hip: Secondary | ICD-10-CM | POA: Diagnosis not present

## 2022-05-28 DIAGNOSIS — M84451A Pathological fracture, right femur, initial encounter for fracture: Secondary | ICD-10-CM | POA: Diagnosis not present

## 2022-05-28 NOTE — Progress Notes (Addendum)
COVID Vaccine Completed: no  Date of COVID positive in last 90 days: no  PCP - Alma Friendly, NP Cardiologist - n/a  Chest x-ray - n/a EKG - 05/31/22 Epic/chart Stress Test - n/a ECHO - n/a Cardiac Cath - n/a Pacemaker/ICD device last checked: n/a Spinal Cord Stimulator: n/a  Bowel Prep - no  Sleep Study - n/a CPAP -   Fasting Blood Sugar - no checks or meds at home Checks Blood Sugar _____ times a day  Blood Thinner Instructions: n/a Aspirin Instructions: Last Dose:  Activity level: Can go up a flight of stairs and perform activities of daily living without stopping and without symptoms of chest pain or shortness of breath.    Anesthesia review: cancer  Patient denies shortness of breath, fever, cough and chest pain at PAT appointment  Patient verbalized understanding of instructions that were given to them at the PAT appointment. Patient was also instructed that they will need to review over the PAT instructions again at home before surgery.

## 2022-05-28 NOTE — Progress Notes (Signed)
Please place orders for PAT appointment scheduled 05/31/22.

## 2022-05-31 ENCOUNTER — Inpatient Hospital Stay (HOSPITAL_COMMUNITY): Admission: RE | Admit: 2022-05-31 | Payer: BC Managed Care – PPO | Source: Ambulatory Visit

## 2022-05-31 ENCOUNTER — Encounter (HOSPITAL_COMMUNITY)
Admission: RE | Admit: 2022-05-31 | Discharge: 2022-05-31 | Disposition: A | Discharge status: Home / Self Care | Payer: BC Managed Care – PPO | Source: Ambulatory Visit | Attending: Orthopedic Surgery | Admitting: Orthopedic Surgery

## 2022-05-31 ENCOUNTER — Encounter (HOSPITAL_COMMUNITY): Payer: Self-pay

## 2022-05-31 VITALS — BP 122/74 | HR 71 | Temp 98.4°F | Resp 12 | Ht 72.0 in | Wt 204.3 lb

## 2022-05-31 DIAGNOSIS — Z01818 Encounter for other preprocedural examination: Secondary | ICD-10-CM | POA: Insufficient documentation

## 2022-05-31 DIAGNOSIS — E119 Type 2 diabetes mellitus without complications: Secondary | ICD-10-CM | POA: Insufficient documentation

## 2022-05-31 DIAGNOSIS — Z9221 Personal history of antineoplastic chemotherapy: Secondary | ICD-10-CM | POA: Diagnosis not present

## 2022-05-31 DIAGNOSIS — F418 Other specified anxiety disorders: Secondary | ICD-10-CM | POA: Diagnosis not present

## 2022-05-31 DIAGNOSIS — Z794 Long term (current) use of insulin: Secondary | ICD-10-CM | POA: Insufficient documentation

## 2022-05-31 DIAGNOSIS — M25711 Osteophyte, right shoulder: Secondary | ICD-10-CM | POA: Diagnosis not present

## 2022-05-31 DIAGNOSIS — C7951 Secondary malignant neoplasm of bone: Secondary | ICD-10-CM | POA: Diagnosis not present

## 2022-05-31 DIAGNOSIS — F17210 Nicotine dependence, cigarettes, uncomplicated: Secondary | ICD-10-CM | POA: Diagnosis not present

## 2022-05-31 DIAGNOSIS — Z85818 Personal history of malignant neoplasm of other sites of lip, oral cavity, and pharynx: Secondary | ICD-10-CM | POA: Diagnosis not present

## 2022-05-31 DIAGNOSIS — M1611 Unilateral primary osteoarthritis, right hip: Secondary | ICD-10-CM | POA: Diagnosis not present

## 2022-05-31 DIAGNOSIS — Z923 Personal history of irradiation: Secondary | ICD-10-CM | POA: Diagnosis not present

## 2022-05-31 HISTORY — DX: Gastro-esophageal reflux disease without esophagitis: K21.9

## 2022-05-31 HISTORY — DX: Unspecified osteoarthritis, unspecified site: M19.90

## 2022-05-31 LAB — BASIC METABOLIC PANEL
Anion gap: 10 (ref 5–15)
BUN: 15 mg/dL (ref 6–20)
CO2: 23 mmol/L (ref 22–32)
Calcium: 9.1 mg/dL (ref 8.9–10.3)
Chloride: 103 mmol/L (ref 98–111)
Creatinine, Ser: 1.26 mg/dL — ABNORMAL HIGH (ref 0.61–1.24)
GFR, Estimated: >60 mL/min (ref >60)
Glucose, Bld: 114 mg/dL — ABNORMAL HIGH (ref 70–99)
Potassium: 3.9 mmol/L (ref 3.5–5.1)
Sodium: 136 mmol/L (ref 135–145)

## 2022-05-31 LAB — GLUCOSE, CAPILLARY: Glucose-Capillary: 130 mg/dL — ABNORMAL HIGH (ref 70–99)

## 2022-05-31 LAB — SURGICAL PCR SCREEN
MRSA, PCR: NEGATIVE
Staphylococcus aureus: NEGATIVE

## 2022-05-31 LAB — HEMOGLOBIN A1C
Hgb A1c MFr Bld: 5.4 % (ref 4.8–5.6)
Mean Plasma Glucose: 108.28 mg/dL

## 2022-05-31 NOTE — Patient Instructions (Addendum)
SURGICAL WAITING ROOM VISITATION Patients having surgery or a procedure may have no more than 2 support people in the waiting area - these visitors may rotate.   Children under the age of 59 must have an adult with them who is not the patient. If the patient needs to stay at the hospital during part of their recovery, the visitor guidelines for inpatient rooms apply. Pre-op nurse will coordinate an appropriate time for 1 support person to accompany patient in pre-op.  This support person may not rotate.    Please refer to the [facility] website for the visitor guidelines for Inpatients (after your surgery is over and you are in a regular room).    Your procedure is scheduled on: 06/02/22   Report to [facility] Main Entrance    Report to admitting at 9:00 AM   Call this number if you have problems the morning of surgery [phone]   Do not eat food :After Midnight.   After Midnight you may have the following liquids until 8:30 AM DAY OF SURGERY  Water Non-Citrus Juices (without pulp, NO RED) Carbonated Beverages Black Coffee (NO MILK/CREAM OR CREAMERS, sugar ok)  Clear Tea (NO MILK/CREAM OR CREAMERS, sugar ok) regular and decaf                             Plain Jell-O (NO RED)                                           Fruit ices (not with fruit pulp, NO RED)                                     Popsicles (NO RED)                                                               Sports drinks like Gatorade (NO RED  FOLLOW BOWEL PREP AND ANY ADDITIONAL PRE OP INSTRUCTIONS YOU RECEIVED FROM YOUR SURGEON'S OFFICE!!!     Oral Hygiene is also important to reduce your risk of infection.                                    Remember - BRUSH YOUR TEETH THE MORNING OF SURGERY WITH YOUR REGULAR TOOTHPASTE   Do NOT smoke after Midnight   Take these medicines the morning of surgery with A SIP OF WATER: Pepcid, Omeprazole, Percocet  DO NOT TAKE ANY ORAL DIABETIC MEDICATIONS DAY OF YOUR  SURGERY  How to Manage Your Diabetes Before and After Surgery  Why is it important to control my blood sugar before and after surgery? Improving blood sugar levels before and after surgery helps healing and can limit problems. A way of improving blood sugar control is eating a healthy diet by:  Eating less sugar and carbohydrates  Increasing activity/exercise  Talking with your doctor about reaching your blood sugar goals High blood sugars (greater than 180 mg/dL) can raise your risk of  infections and slow your recovery, so you will need to focus on controlling your diabetes during the weeks before surgery. Make sure that the doctor who takes care of your diabetes knows about your planned surgery including the date and location.  How do I manage my blood sugar before surgery? Check your blood sugar at least 4 times a day, starting 2 days before surgery, to make sure that the level is not too high or low. Check your blood sugar the morning of your surgery when you wake up and every 2 hours until you get to the Short Stay unit. If your blood sugar is less than 70 mg/dL, you will need to treat for low blood sugar: Do not take insulin. Treat a low blood sugar (less than 70 mg/dL) with  cup of clear juice (cranberry or apple), 4 glucose tablets, OR glucose gel. Recheck blood sugar in 15 minutes after treatment (to make sure it is greater than 70 mg/dL). If your blood sugar is not greater than 70 mg/dL on recheck, call [phone] for further instructions. Report your blood sugar to the short stay nurse when you get to Short Stay.  If you are admitted to the hospital after surgery: Your blood sugar will be checked by the staff and you will probably be given insulin after surgery (instead of oral diabetes medicines) to make sure you have good blood sugar levels. The goal for blood sugar control after surgery is 80-180 mg/dL.  Reviewed and Endorsed by [facility] Patient Education Committee,  August 2015                               You may not have any metal on your body including jewelry, and body piercing             Do not wear lotions, powders, cologne, or deodorant              Men may shave face and neck.   Do not bring valuables to the hospital. [place].   Contacts, dentures or bridgework may not be worn into surgery.  DO NOT [place]. PHARMACY WILL DISPENSE MEDICATIONS LISTED ON YOUR MEDICATION LIST TO YOU DURING YOUR ADMISSION [place]!    Patients discharged on the day of surgery will not be allowed to drive home.  Someone NEEDS to stay with you for the first 24 hours after anesthesia.              Please read over the following fact sheets you were given: IF YOU HAVE QUESTIONS ABOUT YOUR PRE-OP INSTRUCTIONS PLEASE CALL [place] - Preparing for Surgery Before surgery, you can play an important role.  Because skin is not sterile, your skin needs to be as free of germs as possible.  You can reduce the number of germs on your skin by washing with CHG (chlorahexidine gluconate) soap before surgery.  CHG is an antiseptic cleaner which kills germs and bonds with the skin to continue killing germs even after washing. Please DO NOT use if you have an allergy to CHG or antibacterial soaps.  If your skin becomes reddened/irritated stop using the CHG and inform your nurse when you arrive at Short Stay. Do not shave (including legs and underarms) for  at least 48 hours prior to the first CHG shower.  You may shave your face/neck.  Please follow these instructions carefully:  1.  Shower with CHG Soap the night before surgery and the  morning of surgery.  2.  If you choose to wash your hair, wash your hair first as usual with your normal  shampoo.  3.  After you shampoo, rinse your hair and body thoroughly to remove the shampoo.                             4.   Use CHG as you would any other liquid soap.  You can apply chg directly to the skin and wash.  Gently with a scrungie or clean washcloth.  5.  Apply the CHG Soap to your body ONLY FROM THE NECK DOWN.   Do   not use on face/ open                           Wound or open sores. Avoid contact with eyes, ears mouth and   genitals (private parts).                       Wash face,  Genitals (private parts) with your normal soap.             6.  Wash thoroughly, paying special attention to the area where your    surgery  will be performed.  7.  Thoroughly rinse your body with warm water from the neck down.  8.  DO NOT shower/wash with your normal soap after using and rinsing off the CHG Soap.                9.  Pat yourself dry with a clean towel.            10.  Wear clean pajamas.            11.  Place clean sheets on your bed the night of your first shower and do not  sleep with pets. Day of Surgery : Do not apply any lotions/deodorants the morning of surgery.  Please wear clean clothes to the hospital/surgery center.  FAILURE TO FOLLOW THESE INSTRUCTIONS MAY RESULT IN THE CANCELLATION OF YOUR SURGERY  PATIENT SIGNATURE_________________________________  NURSE SIGNATURE__________________________________  ________________________________________________________________________    WHAT IS A BLOOD TRANSFUSION? Blood Transfusion Information  A transfusion is the replacement of blood or some of its parts. Blood is made up of multiple cells which provide different functions. Red blood cells carry oxygen and are used for blood loss replacement. White blood cells fight against infection. Platelets control bleeding. Plasma helps clot blood. Other blood products are available for specialized needs, such as hemophilia or other clotting disorders. BEFORE THE TRANSFUSION  Who gives blood for transfusions?  Healthy volunteers who are fully evaluated to make sure their blood is safe. This is blood bank  blood. Transfusion therapy is the safest it has ever been in the practice of medicine. Before blood is taken from a donor, a complete history is taken to make sure that person has no history of diseases nor engages in risky social behavior (examples are intravenous drug use or sexual activity with multiple partners). The donor's travel history is screened to minimize risk of transmitting infections, such as malaria. The donated blood is tested for signs of infectious diseases, such as HIV and  hepatitis. The blood is then tested to be sure it is compatible with you in order to minimize the chance of a transfusion reaction. If you or a relative donates blood, this is often done in anticipation of surgery and is not appropriate for emergency situations. It takes many days to process the donated blood. RISKS AND COMPLICATIONS Although transfusion therapy is very safe and saves many lives, the main dangers of transfusion include:  Getting an infectious disease. Developing a transfusion reaction. This is an allergic reaction to something in the blood you were given. Every precaution is taken to prevent this. The decision to have a blood transfusion has been considered carefully by your caregiver before blood is given. Blood is not given unless the benefits outweigh the risks. AFTER THE TRANSFUSION Right after receiving a blood transfusion, you will usually feel much better and more energetic. This is especially true if your red blood cells have gotten low (anemic). The transfusion raises the level of the red blood cells which carry oxygen, and this usually causes an energy increase. The nurse administering the transfusion will monitor you carefully for complications. HOME CARE INSTRUCTIONS  No special instructions are needed after a transfusion. You may find your energy is better. Speak with your caregiver about any limitations on activity for underlying diseases you may have. SEEK MEDICAL CARE IF:  Your  condition is not improving after your transfusion. You develop redness or irritation at the intravenous (IV) site. SEEK IMMEDIATE MEDICAL CARE IF:  Any of the following symptoms occur over the next 12 hours: Shaking chills. You have a temperature by mouth above 102 F (38.9 C), not controlled by medicine. Chest, back, or muscle pain. People around you feel you are not acting correctly or are confused. Shortness of breath or difficulty breathing. Dizziness and fainting. You get a rash or develop hives. You have a decrease in urine output. Your urine turns a dark color or changes to pink, red, or brown. Any of the following symptoms occur over the next 10 days: You have a temperature by mouth above 102 F (38.9 C), not controlled by medicine. Shortness of breath. Weakness after normal activity. The white part of the eye turns yellow (jaundice). You have a decrease in the amount of urine or are urinating less often. Your urine turns a dark color or changes to pink, red, or brown. Document Released: 10/08/2000 Document Revised: 01/03/2012 Document Reviewed: 05/27/2008 [facility] Patient Information 2014 [place], [place].  _______________________________________________________________________

## 2022-06-01 NOTE — Anesthesia Preprocedure Evaluation (Signed)
Anesthesia Evaluation  Patient identified by MRN, date of birth, ID band Patient awake    Reviewed: Allergy & Precautions, NPO status , Patient's Chart, lab work & pertinent test results  History of Anesthesia Complications Negative for: history of anesthetic complications  Airway Mallampati: II  TM Distance: >3 FB Neck ROM: Full    Dental no notable dental hx. (+) Dental Advisory Given   Pulmonary Current Smoker and Patient abstained from smoking.,    Pulmonary exam normal        Cardiovascular negative cardio ROS Normal cardiovascular exam     Neuro/Psych negative neurological ROS     GI/Hepatic Neg liver ROS, GERD  Medicated,  Endo/Other  negative endocrine ROSdiabetes  Renal/GU negative Renal ROS     Musculoskeletal  (+) Arthritis , Pathologic fx   Abdominal   Peds  Hematology negative hematology ROS (+)   Anesthesia Other Findings S/P throat CA (Chemo and Rad Tx)  Reproductive/Obstetrics                            Anesthesia Physical Anesthesia Plan  ASA: 2  Anesthesia Plan: Spinal   Post-op Pain Management: Celebrex PO (pre-op)* and Tylenol PO (pre-op)*   Induction:   PONV Risk Score and Plan: Ondansetron and Propofol infusion  Airway Management Planned: Natural Airway  Additional Equipment:   Intra-op Plan:   Post-operative Plan:   Informed Consent: I have reviewed the patients History and Physical, chart, labs and discussed the procedure including the risks, benefits and alternatives for the proposed anesthesia with the patient or authorized representative who has indicated his/her understanding and acceptance.     Dental advisory given  Plan Discussed with: Anesthesiologist and CRNA  Anesthesia Plan Comments:        Anesthesia Quick Evaluation

## 2022-06-02 ENCOUNTER — Ambulatory Visit (HOSPITAL_COMMUNITY): Payer: BC Managed Care – PPO

## 2022-06-02 ENCOUNTER — Encounter (HOSPITAL_COMMUNITY)
Admission: RE | Disposition: A | Discharge status: Home / Self Care | Payer: Self-pay | Source: Home / Self Care | Attending: Orthopedic Surgery

## 2022-06-02 ENCOUNTER — Other Ambulatory Visit: Payer: Self-pay

## 2022-06-02 ENCOUNTER — Encounter (HOSPITAL_COMMUNITY): Payer: Self-pay | Admitting: Orthopedic Surgery

## 2022-06-02 ENCOUNTER — Ambulatory Visit (HOSPITAL_COMMUNITY)
Admission: RE | Admit: 2022-06-02 | Discharge: 2022-06-02 | Disposition: A | Discharge status: Home / Self Care | Payer: BC Managed Care – PPO | Attending: Orthopedic Surgery | Admitting: Orthopedic Surgery

## 2022-06-02 ENCOUNTER — Ambulatory Visit (HOSPITAL_COMMUNITY): Payer: BC Managed Care – PPO | Admitting: Emergency Medicine

## 2022-06-02 ENCOUNTER — Ambulatory Visit (HOSPITAL_COMMUNITY): Payer: BC Managed Care – PPO | Admitting: Anesthesiology

## 2022-06-02 DIAGNOSIS — F17210 Nicotine dependence, cigarettes, uncomplicated: Secondary | ICD-10-CM | POA: Insufficient documentation

## 2022-06-02 DIAGNOSIS — M898X5 Other specified disorders of bone, thigh: Secondary | ICD-10-CM | POA: Diagnosis present

## 2022-06-02 DIAGNOSIS — Z96641 Presence of right artificial hip joint: Secondary | ICD-10-CM | POA: Diagnosis not present

## 2022-06-02 DIAGNOSIS — Z923 Personal history of irradiation: Secondary | ICD-10-CM | POA: Insufficient documentation

## 2022-06-02 DIAGNOSIS — C4492 Squamous cell carcinoma of skin, unspecified: Secondary | ICD-10-CM | POA: Diagnosis not present

## 2022-06-02 DIAGNOSIS — E119 Type 2 diabetes mellitus without complications: Secondary | ICD-10-CM | POA: Diagnosis not present

## 2022-06-02 DIAGNOSIS — M1611 Unilateral primary osteoarthritis, right hip: Secondary | ICD-10-CM | POA: Diagnosis not present

## 2022-06-02 DIAGNOSIS — M899 Disorder of bone, unspecified: Secondary | ICD-10-CM | POA: Diagnosis present

## 2022-06-02 DIAGNOSIS — Z9221 Personal history of antineoplastic chemotherapy: Secondary | ICD-10-CM | POA: Insufficient documentation

## 2022-06-02 DIAGNOSIS — M25711 Osteophyte, right shoulder: Secondary | ICD-10-CM | POA: Diagnosis not present

## 2022-06-02 DIAGNOSIS — M84451A Pathological fracture, right femur, initial encounter for fracture: Secondary | ICD-10-CM | POA: Diagnosis not present

## 2022-06-02 DIAGNOSIS — C7951 Secondary malignant neoplasm of bone: Secondary | ICD-10-CM | POA: Diagnosis not present

## 2022-06-02 DIAGNOSIS — Z01818 Encounter for other preprocedural examination: Secondary | ICD-10-CM

## 2022-06-02 DIAGNOSIS — Z85818 Personal history of malignant neoplasm of other sites of lip, oral cavity, and pharynx: Secondary | ICD-10-CM | POA: Diagnosis not present

## 2022-06-02 DIAGNOSIS — Z471 Aftercare following joint replacement surgery: Secondary | ICD-10-CM | POA: Diagnosis not present

## 2022-06-02 DIAGNOSIS — M89251 Other disorders of bone development and growth, right femur: Secondary | ICD-10-CM | POA: Diagnosis not present

## 2022-06-02 DIAGNOSIS — C801 Malignant (primary) neoplasm, unspecified: Secondary | ICD-10-CM | POA: Diagnosis not present

## 2022-06-02 HISTORY — PX: BONE BIOPSY: SHX375

## 2022-06-02 HISTORY — PX: TOTAL HIP ARTHROPLASTY: SHX124

## 2022-06-02 LAB — TYPE AND SCREEN
ABO/RH(D): A POS
Antibody Screen: NEGATIVE

## 2022-06-02 LAB — GLUCOSE, CAPILLARY: Glucose-Capillary: 123 mg/dL — ABNORMAL HIGH (ref 70–99)

## 2022-06-02 LAB — ABO/RH: ABO/RH(D): A POS

## 2022-06-02 SURGERY — BIOPSY, BONE
Anesthesia: Spinal | Site: Hip | Laterality: Right

## 2022-06-02 MED ORDER — LACTATED RINGERS IV BOLUS
500.0000 mL | Freq: Once | INTRAVENOUS | Status: AC
  Administered 2022-06-02: 500 mL via INTRAVENOUS

## 2022-06-02 MED ORDER — KETOROLAC TROMETHAMINE 15 MG/ML IJ SOLN: 15.0000 mg | Freq: Four times a day (QID) | INTRAMUSCULAR | Status: DC

## 2022-06-02 MED ORDER — KETOROLAC TROMETHAMINE 30 MG/ML IJ SOLN
INTRAMUSCULAR | Status: DC | PRN
  Administered 2022-06-02: 30 mg

## 2022-06-02 MED ORDER — SODIUM CHLORIDE (PF) 0.9 % IJ SOLN
INTRAMUSCULAR | Status: DC | PRN
  Administered 2022-06-02: 30 mL

## 2022-06-02 MED ORDER — AMISULPRIDE (ANTIEMETIC) 5 MG/2ML IV SOLN
INTRAVENOUS | Status: AC
  Filled 2022-06-02: qty 4

## 2022-06-02 MED ORDER — PROPOFOL 500 MG/50ML IV EMUL
INTRAVENOUS | Status: DC | PRN
  Administered 2022-06-02: 120 ug/kg/min via INTRAVENOUS

## 2022-06-02 MED ORDER — SODIUM CHLORIDE (PF) 0.9 % IJ SOLN
INTRAMUSCULAR | Status: AC
Start: 2022-06-02 — End: ?
  Filled 2022-06-02: qty 30

## 2022-06-02 MED ORDER — POVIDONE-IODINE 10 % EX SWAB: 2.0000 | Freq: Once | CUTANEOUS | Status: DC

## 2022-06-02 MED ORDER — PHENYLEPHRINE HCL-NACL 20-0.9 MG/250ML-% IV SOLN
INTRAVENOUS | Status: DC | PRN
  Administered 2022-06-02: 50 ug/min via INTRAVENOUS

## 2022-06-02 MED ORDER — PROMETHAZINE HCL 25 MG/ML IJ SOLN: 6.2500 mg | INTRAMUSCULAR | Status: DC | PRN

## 2022-06-02 MED ORDER — METHOCARBAMOL 500 MG IVPB - SIMPLE MED: 500.0000 mg | Freq: Four times a day (QID) | INTRAVENOUS | Status: DC | PRN

## 2022-06-02 MED ORDER — PROPOFOL 10 MG/ML IV BOLUS
INTRAVENOUS | Status: DC | PRN
  Administered 2022-06-02: 20 mg via INTRAVENOUS

## 2022-06-02 MED ORDER — DEXAMETHASONE SODIUM PHOSPHATE 10 MG/ML IJ SOLN
INTRAMUSCULAR | Status: DC | PRN
  Administered 2022-06-02: 10 mg via INTRAVENOUS

## 2022-06-02 MED ORDER — BUPIVACAINE-EPINEPHRINE (PF) 0.25% -1:200000 IJ SOLN
INTRAMUSCULAR | Status: AC
  Filled 2022-06-02: qty 30

## 2022-06-02 MED ORDER — FENTANYL CITRATE (PF) 100 MCG/2ML IJ SOLN
INTRAMUSCULAR | Status: AC
  Filled 2022-06-02: qty 2

## 2022-06-02 MED ORDER — AMISULPRIDE (ANTIEMETIC) 5 MG/2ML IV SOLN
10.0000 mg | Freq: Once | INTRAVENOUS | Status: AC | PRN
  Administered 2022-06-02: 10 mg via INTRAVENOUS

## 2022-06-02 MED ORDER — ACETAMINOPHEN 325 MG PO TABS: 325.0000 mg | ORAL_TABLET | Freq: Four times a day (QID) | ORAL | Status: DC | PRN

## 2022-06-02 MED ORDER — CELECOXIB 200 MG PO CAPS
200.0000 mg | ORAL_CAPSULE | Freq: Once | ORAL | Status: AC
  Administered 2022-06-02: 200 mg via ORAL
  Filled 2022-06-02: qty 1

## 2022-06-02 MED ORDER — CEFAZOLIN SODIUM-DEXTROSE 2-4 GM/100ML-% IV SOLN: 2.0000 g | Freq: Four times a day (QID) | INTRAVENOUS | Status: DC

## 2022-06-02 MED ORDER — KETOROLAC TROMETHAMINE 30 MG/ML IJ SOLN
INTRAMUSCULAR | Status: AC
Start: 2022-06-02 — End: ?
  Filled 2022-06-02: qty 1

## 2022-06-02 MED ORDER — LACTATED RINGERS IV SOLN: INTRAVENOUS | Status: DC

## 2022-06-02 MED ORDER — OXYCODONE HCL 5 MG PO TABS
5.0000 mg | ORAL_TABLET | ORAL | Status: DC | PRN
  Administered 2022-06-02: 5 mg via ORAL

## 2022-06-02 MED ORDER — ACETAMINOPHEN 500 MG PO TABS: 1000.0000 mg | ORAL_TABLET | Freq: Once | ORAL | Status: DC

## 2022-06-02 MED ORDER — APIXABAN 2.5 MG PO TABS: 2.5000 mg | ORAL_TABLET | Freq: Two times a day (BID) | ORAL | 0 refills | Status: DC

## 2022-06-02 MED ORDER — PHENYLEPHRINE HCL-NACL 20-0.9 MG/250ML-% IV SOLN
INTRAVENOUS | Status: AC
  Filled 2022-06-02: qty 250

## 2022-06-02 MED ORDER — OXYCODONE HCL 5 MG PO TABS: 10.0000 mg | ORAL_TABLET | ORAL | Status: DC | PRN

## 2022-06-02 MED ORDER — METOCLOPRAMIDE HCL 5 MG PO TABS: 5.0000 mg | ORAL_TABLET | Freq: Three times a day (TID) | ORAL | Status: DC | PRN

## 2022-06-02 MED ORDER — POVIDONE-IODINE 10 % EX SWAB
2.0000 | Freq: Once | CUTANEOUS | Status: AC
  Administered 2022-06-02: 2 via TOPICAL

## 2022-06-02 MED ORDER — SODIUM CHLORIDE 0.9 % IR SOLN
Status: DC | PRN
  Administered 2022-06-02: 3000 mL
  Administered 2022-06-02 (×2): 1000 mL

## 2022-06-02 MED ORDER — SENNA 8.6 MG PO TABS: 2.0000 | ORAL_TABLET | Freq: Every day | ORAL | 1 refills | Status: AC

## 2022-06-02 MED ORDER — BUPIVACAINE IN DEXTROSE 0.75-8.25 % IT SOLN
INTRATHECAL | Status: DC | PRN
  Administered 2022-06-02: 2 mL via INTRATHECAL

## 2022-06-02 MED ORDER — ONDANSETRON HCL 4 MG/2ML IJ SOLN
INTRAMUSCULAR | Status: AC
  Filled 2022-06-02: qty 2

## 2022-06-02 MED ORDER — LACTATED RINGERS IV BOLUS
250.0000 mL | Freq: Once | INTRAVENOUS | Status: AC
Start: 2022-06-02 — End: 2022-06-02
  Administered 2022-06-02: 250 mL via INTRAVENOUS

## 2022-06-02 MED ORDER — DEXAMETHASONE SODIUM PHOSPHATE 10 MG/ML IJ SOLN
INTRAMUSCULAR | Status: AC
  Filled 2022-06-02: qty 1

## 2022-06-02 MED ORDER — KETOROLAC TROMETHAMINE 30 MG/ML IJ SOLN
INTRAMUSCULAR | Status: AC
  Filled 2022-06-02: qty 1

## 2022-06-02 MED ORDER — POLYETHYLENE GLYCOL 3350 17 G PO PACK: 17.0000 g | PACK | Freq: Every day | ORAL | 0 refills | Status: DC | PRN

## 2022-06-02 MED ORDER — FENTANYL CITRATE (PF) 100 MCG/2ML IJ SOLN
INTRAMUSCULAR | Status: DC | PRN
  Administered 2022-06-02: 100 ug via INTRAVENOUS

## 2022-06-02 MED ORDER — METOCLOPRAMIDE HCL 5 MG/ML IJ SOLN: 5.0000 mg | Freq: Three times a day (TID) | INTRAMUSCULAR | Status: DC | PRN

## 2022-06-02 MED ORDER — BUPIVACAINE-EPINEPHRINE 0.25% -1:200000 IJ SOLN
INTRAMUSCULAR | Status: DC | PRN
  Administered 2022-06-02: 30 mL

## 2022-06-02 MED ORDER — ONDANSETRON HCL 4 MG PO TABS: 4.0000 mg | ORAL_TABLET | Freq: Four times a day (QID) | ORAL | Status: DC | PRN

## 2022-06-02 MED ORDER — MIDAZOLAM HCL 2 MG/2ML IJ SOLN
INTRAMUSCULAR | Status: AC
  Filled 2022-06-02: qty 2

## 2022-06-02 MED ORDER — PROPOFOL 10 MG/ML IV BOLUS
INTRAVENOUS | Status: AC
  Filled 2022-06-02: qty 20

## 2022-06-02 MED ORDER — ONDANSETRON HCL 4 MG/2ML IJ SOLN: 4.0000 mg | Freq: Four times a day (QID) | INTRAMUSCULAR | Status: DC | PRN

## 2022-06-02 MED ORDER — SODIUM CHLORIDE 0.9 % IV SOLN
INTRAVENOUS | Status: DC
Start: 2022-06-02 — End: 2022-06-02

## 2022-06-02 MED ORDER — CHLORHEXIDINE GLUCONATE 0.12 % MT SOLN
15.0000 mL | Freq: Once | OROMUCOSAL | Status: AC
  Administered 2022-06-02: 15 mL via OROMUCOSAL

## 2022-06-02 MED ORDER — ONDANSETRON HCL 4 MG PO TABS: 4.0000 mg | ORAL_TABLET | Freq: Three times a day (TID) | ORAL | 0 refills | Status: DC | PRN

## 2022-06-02 MED ORDER — ACETAMINOPHEN 500 MG PO TABS
1000.0000 mg | ORAL_TABLET | Freq: Once | ORAL | Status: AC
  Administered 2022-06-02: 1000 mg via ORAL
  Filled 2022-06-02: qty 2

## 2022-06-02 MED ORDER — DOCUSATE SODIUM 100 MG PO CAPS: 100.0000 mg | ORAL_CAPSULE | Freq: Two times a day (BID) | ORAL | 1 refills | Status: AC

## 2022-06-02 MED ORDER — CEFAZOLIN SODIUM-DEXTROSE 2-4 GM/100ML-% IV SOLN
2.0000 g | INTRAVENOUS | Status: AC
  Administered 2022-06-02: 2 g via INTRAVENOUS
  Filled 2022-06-02: qty 100

## 2022-06-02 MED ORDER — OXYCODONE HCL 5 MG PO TABS
ORAL_TABLET | ORAL | Status: AC
  Filled 2022-06-02: qty 1

## 2022-06-02 MED ORDER — MIDAZOLAM HCL 5 MG/5ML IJ SOLN
INTRAMUSCULAR | Status: DC | PRN
  Administered 2022-06-02: 2 mg via INTRAVENOUS

## 2022-06-02 MED ORDER — TRANEXAMIC ACID-NACL 1000-0.7 MG/100ML-% IV SOLN
1000.0000 mg | INTRAVENOUS | Status: AC
  Administered 2022-06-02: 1000 mg via INTRAVENOUS
  Filled 2022-06-02: qty 100

## 2022-06-02 MED ORDER — PROPOFOL 1000 MG/100ML IV EMUL
INTRAVENOUS | Status: AC
  Filled 2022-06-02: qty 100

## 2022-06-02 MED ORDER — HYDROMORPHONE HCL 1 MG/ML IJ SOLN: 0.5000 mg | INTRAMUSCULAR | Status: DC | PRN

## 2022-06-02 MED ORDER — ISOPROPYL ALCOHOL 70 % SOLN
Status: DC | PRN
  Administered 2022-06-02: 1 via TOPICAL

## 2022-06-02 MED ORDER — ORAL CARE MOUTH RINSE
15.0000 mL | Freq: Once | OROMUCOSAL | Status: AC
Start: 2022-06-02 — End: 2022-06-02

## 2022-06-02 MED ORDER — ONDANSETRON HCL 4 MG/2ML IJ SOLN
INTRAMUSCULAR | Status: DC | PRN
  Administered 2022-06-02: 4 mg via INTRAVENOUS

## 2022-06-02 MED ORDER — METHOCARBAMOL 500 MG PO TABS: 500.0000 mg | ORAL_TABLET | Freq: Four times a day (QID) | ORAL | Status: DC | PRN

## 2022-06-02 MED ORDER — FENTANYL CITRATE PF 50 MCG/ML IJ SOSY: 25.0000 ug | PREFILLED_SYRINGE | INTRAMUSCULAR | Status: DC | PRN

## 2022-06-02 SURGICAL SUPPLY — 61 items
ACE SHELL 54 4H HIP (Shell) ×3 IMPLANT
BAG COUNTER SPONGE SURGICOUNT (BAG) IMPLANT
BAG DECANTER FOR FLEXI CONT (MISCELLANEOUS) IMPLANT
BAG ZIPLOCK 12X15 (MISCELLANEOUS) IMPLANT
CHLORAPREP W/TINT 26 (MISCELLANEOUS) ×3 IMPLANT
COVER PERINEAL POST (MISCELLANEOUS) ×3 IMPLANT
COVER SURGICAL LIGHT HANDLE (MISCELLANEOUS) ×3 IMPLANT
DERMABOND ADVANCED (GAUZE/BANDAGES/DRESSINGS) ×2
DERMABOND ADVANCED .7 DNX12 (GAUZE/BANDAGES/DRESSINGS) ×4 IMPLANT
DRAPE IMP U-DRAPE 54X76 (DRAPES) ×3 IMPLANT
DRAPE SHEET LG 3/4 BI-LAMINATE (DRAPES) ×9 IMPLANT
DRAPE STERI IOBAN 125X83 (DRAPES) ×3 IMPLANT
DRAPE U-SHAPE 47X51 STRL (DRAPES) ×6 IMPLANT
DRESSING AQUACEL AG SP 3.5X10 (GAUZE/BANDAGES/DRESSINGS) IMPLANT
DRSG AQUACEL AG ADV 3.5X10 (GAUZE/BANDAGES/DRESSINGS) ×3 IMPLANT
DRSG AQUACEL AG SP 3.5X10 (GAUZE/BANDAGES/DRESSINGS) ×3
ELECT REM PT RETURN 15FT ADLT (MISCELLANEOUS) ×3 IMPLANT
GAUZE SPONGE 4X4 12PLY STRL (GAUZE/BANDAGES/DRESSINGS) ×3 IMPLANT
GLOVE BIO SURGEON STRL SZ8.5 (GLOVE) ×6 IMPLANT
GLOVE BIOGEL M 7.0 STRL (GLOVE) ×3 IMPLANT
GLOVE BIOGEL PI IND STRL 7.5 (GLOVE) ×2 IMPLANT
GLOVE BIOGEL PI IND STRL 8.5 (GLOVE) ×2 IMPLANT
GLOVE BIOGEL PI INDICATOR 7.5 (GLOVE) ×1
GLOVE BIOGEL PI INDICATOR 8.5 (GLOVE) ×1
GLOVE SURG LX 7.5 STRW (GLOVE) ×2
GLOVE SURG LX STRL 7.5 STRW (GLOVE) ×4 IMPLANT
GOWN SPEC L3 XXLG W/TWL (GOWN DISPOSABLE) ×3 IMPLANT
GOWN STRL REUS W/ TWL XL LVL3 (GOWN DISPOSABLE) ×2 IMPLANT
GOWN STRL REUS W/TWL XL LVL3 (GOWN DISPOSABLE) ×1
HANDPIECE INTERPULSE COAX TIP (DISPOSABLE) ×1
HEAD FEM CER MOD 36 +6 (Head) ×1 IMPLANT
HOLDER FOLEY CATH W/STRAP (MISCELLANEOUS) ×3 IMPLANT
HOOD PEEL AWAY FLYTE STAYCOOL (MISCELLANEOUS) ×9 IMPLANT
KIT TURNOVER KIT A (KITS) IMPLANT
LINER ACETAB NN G7 F 36 (Liner) ×1 IMPLANT
MANIFOLD NEPTUNE II (INSTRUMENTS) ×3 IMPLANT
MARKER SKIN DUAL TIP RULER LAB (MISCELLANEOUS) ×3 IMPLANT
NDL SAFETY ECLIPSE 18X1.5 (NEEDLE) ×2 IMPLANT
NDL SPNL 18GX3.5 QUINCKE PK (NEEDLE) ×2 IMPLANT
NEEDLE HYPO 18GX1.5 SHARP (NEEDLE) ×1
NEEDLE SPNL 18GX3.5 QUINCKE PK (NEEDLE) ×3 IMPLANT
PACK ANTERIOR HIP CUSTOM (KITS) ×3 IMPLANT
PENCIL SMOKE EVACUATOR (MISCELLANEOUS) IMPLANT
SAW OSC TIP CART 19.5X105X1.3 (SAW) ×3 IMPLANT
SEALER BIPOLAR AQUA 6.0 (INSTRUMENTS) ×3 IMPLANT
SET HNDPC FAN SPRY TIP SCT (DISPOSABLE) ×2 IMPLANT
SHELL ACETAB 54 4H HIP (Shell) IMPLANT
SOLUTION PRONTOSAN WOUND 350ML (IRRIGATION / IRRIGATOR) ×3 IMPLANT
SPIKE FLUID TRANSFER (MISCELLANEOUS) ×3 IMPLANT
STEM FEM HIOFF ARCOS 16X175 (Stem) ×1 IMPLANT
SUT MNCRL AB 3-0 PS2 18 (SUTURE) ×3 IMPLANT
SUT MON AB 2-0 CT1 36 (SUTURE) ×3 IMPLANT
SUT STRATAFIX PDO 1 14 VIOLET (SUTURE) ×1
SUT STRATFX PDO 1 14 VIOLET (SUTURE) ×2
SUT VIC AB 2-0 CT1 27 (SUTURE)
SUT VIC AB 2-0 CT1 TAPERPNT 27 (SUTURE) IMPLANT
SUTURE STRATFX PDO 1 14 VIOLET (SUTURE) ×2 IMPLANT
SYR 3ML LL SCALE MARK (SYRINGE) ×3 IMPLANT
TRAY FOLEY MTR SLVR 16FR STAT (SET/KITS/TRAYS/PACK) IMPLANT
TUBE SUCTION HIGH CAP CLEAR NV (SUCTIONS) ×3 IMPLANT
WATER STERILE IRR 1000ML POUR (IV SOLUTION) ×3 IMPLANT

## 2022-06-02 NOTE — Evaluation (Signed)
Physical Therapy Evaluation Patient Details Name: Justin Baldwin MRN: [id number] DOB: 15-Aug-1976 Today's Date: 06/02/2022  History of Present Illness  Pt is a 46yo male presenting s/p R-THA, AA with open biopsy of femur on 06/02/22 secondary to metastatic cancer, confirmed metastatic carcinoma via lab test. 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 male POD 0 s/p R-THA, AA. Patient reports independence with mobility at baseline. Patient is now limited by functional impairments (see PT problem list below) and requires min guard for transfers and gait with RW. Patient was able to ambulate 80 feet with RW and min guard and cues for safe walker management. Patient educated on safe sequencing for stair mobility and verbalized safe guarding position for people assisting with mobility. Patient instructed in exercises to facilitate ROM and circulation. Patient will benefit from continued skilled PT interventions to address impairments and progress towards PLOF. Patient has met mobility goals at adequate level for discharge home; will continue to follow if pt continues acute stay to progress towards Mod I goals.        Recommendations for follow up therapy are one component of a multi-disciplinary discharge planning process, led by the attending physician.  Recommendations may be updated based on patient status, additional functional criteria and insurance authorization.  Follow Up Recommendations Follow physician's recommendations for discharge plan and follow up therapies      Assistance Recommended at Discharge Set up Supervision/Assistance  Patient can return home with the following  A little help with walking and/or transfers;A little help with bathing/dressing/bathroom;Assistance with cooking/housework;Assist for transportation;Help with stairs or ramp for entrance    Equipment Recommendations Rolling walker (2 wheels)   Recommendations for Other Services       Functional Status Assessment Patient has had a recent decline in their functional status and demonstrates the ability to make significant improvements in function in a reasonable and predictable amount of time.     Precautions / Restrictions Precautions Precautions: None Restrictions Weight Bearing Restrictions: Yes RUE Weight Bearing: Weight bearing as tolerated RLE Weight Bearing: Weight bearing as tolerated      Mobility  Bed Mobility Overal bed mobility: Modified Independent             General bed mobility comments: Increased time    Transfers Overall transfer level: Needs assistance Equipment used: Rolling walker (2 wheels) Transfers: Sit to/from Stand Sit to Stand: Min guard, From elevated surface           General transfer comment: Pt min guard for safety from elevated surface, VCs for sequencing, slight anterior lean upon first standing but once WB through BLEs, pt demonstrated near symmetrical stance using BUE for suppor.t    Ambulation/Gait Ambulation/Gait assistance: Supervision, Min guard Gait Distance (Feet): 80 Feet Assistive device: Rolling walker (2 wheels) Gait Pattern/deviations: Step-to pattern, Decreased weight shift to right Gait velocity: decreased     General Gait Details: Pt ambulated with RW and min guard progressed to supervision, no overt LOB noted nor physical assist required. Demonstrated mildly antalgic gait pattern with decreased weight to R. VCs for proximity to device and smoothness of gait.  Stairs Stairs: Yes Stairs assistance: Min guard Stair Management: One rail Left, Step to pattern, Forwards, With cane Number of Stairs: 2 General stair comments: Pt educated on safe stair mobility and verbalized understanding. Demonstrated safe technique with min guard, no physical assist required, no overt LOB noted.  Caregiver verbalized safe gaurding position  Wheelchair Mobility    Modified  [place] (Stroke Patients Only)       Balance Overall balance assessment: Needs assistance Sitting-balance support: Feet supported, No upper extremity supported Sitting balance-Leahy Scale: Good     Standing balance support: Reliant on assistive device for balance, During functional activity, Bilateral upper extremity supported Standing balance-Leahy Scale: Poor                               Pertinent Vitals/Pain Pain Assessment Pain Assessment: 0-10 Pain Score: 4  Pain Location: right hip and buttocks Pain Descriptors / Indicators: Operative site guarding Pain Intervention(s): Monitored during session, Repositioned, Ice applied    Home Living Family/patient expects to be discharged to:: Private residence Living Arrangements: Spouse/significant other Available Help at Discharge: Family;Available 24 hours/day Type of Home: House Home Access: Stairs to enter Entrance Stairs-Rails: Left Entrance Stairs-Number of Steps: 5   Home Layout: One level Home Equipment: Crutches;Cane - single point      Prior Function Prior Level of Function : Independent/Modified Independent;Working/employed;Driving Tour manager)             Mobility Comments: uses SPC during periods of pain ADLs Comments: ind     Hand Dominance        Extremity/Trunk Assessment   Upper Extremity Assessment Upper Extremity Assessment: Overall WFL for tasks assessed    Lower Extremity Assessment Lower Extremity Assessment: RLE deficits/detail;LLE deficits/detail RLE Deficits / Details: MMT ank DF/PF 5/5 RLE Sensation: WNL LLE Deficits / Details: MMT ank DF/PF 5/5 LLE Sensation: WNL    Cervical / Trunk Assessment Cervical / Trunk Assessment: Normal  Communication   Communication: No difficulties  Cognition Arousal/Alertness: Awake/alert Behavior During Therapy: WFL for tasks assessed/performed Overall Cognitive Status: Within Functional Limits for tasks assessed                                           General Comments      Exercises Total Joint Exercises Ankle Circles/Pumps: AROM, Both, 10 reps Quad Sets: AROM, Right, Other reps (comment) (3) Short Arc Quad: AROM, Right, Other reps (comment) (3) Heel Slides: AROM, Right, Other reps (comment) (3) Hip ABduction/ADduction: AROM, Right, Seated, Other reps (comment) (3, educated on standing but not performed) Knee Flexion: Other (comment) (educated not performed) Marching in Standing: Other (comment) (educated not performed) Standing Hip Extension: Other (comment) (educated not performed)   Assessment/Plan    PT Assessment Patient needs continued PT services  PT Problem List Decreased strength;Decreased range of motion;Decreased activity tolerance;Decreased balance;Decreased mobility;Decreased coordination;Decreased knowledge of use of DME;Pain       PT Treatment Interventions DME instruction;Gait training;Stair training;Functional mobility training;Therapeutic activities;Therapeutic exercise;Balance training;Patient/family education;Neuromuscular re-education    PT Goals (Current goals can be found in the Care Plan section)  Acute Rehab PT Goals Patient Stated Goal: Return to work PT Goal Formulation: With patient Time For Goal Achievement: 06/09/22 Potential to Achieve Goals: Good    Frequency 7X/week     Co-evaluation               AM-PAC PT "6 Clicks" Mobility  Outcome Measure Help needed turning from your back to your side while in a flat bed without using bedrails?: None Help needed moving from lying on your back to sitting on the side of a  flat bed without using bedrails?: None Help needed moving to and from a bed to a chair (including a wheelchair)?: None Help needed standing up from a chair using your arms (e.g., wheelchair or bedside chair)?: A Little Help needed to walk in hospital room?: A Little Help needed climbing 3-5 steps with a railing? : A Little 6 Click  Score: 21    End of Session Equipment Utilized During Treatment: Gait belt Activity Tolerance: Patient tolerated treatment well;No increased pain Patient left: in chair;with call bell/phone within reach;with family/visitor present Nurse Communication: Mobility status PT Visit Diagnosis: Pain;Difficulty in walking, not elsewhere classified (R26.2) Pain - Right/Left: Right Pain - part of body: Knee    Time: 1640-1716 PT Time Calculation (min) (ACUTE ONLY): 36 min   Charges:   PT Evaluation $PT Eval Low Complexity: 1 Low PT Treatments $Gait Training: 8-22 mins        Coolidge Breeze, PT, DPT [facility] Office: [phone] Pager: [phone]  Coolidge Breeze 06/02/2022, 5:33 PM

## 2022-06-02 NOTE — Anesthesia Procedure Notes (Signed)
Spinal  Patient location during procedure: OR Start time: 06/02/2022 10:47 AM End time: 06/02/2022 10:52 AM Reason for block: surgical anesthesia Staffing Performed: anesthesiologist  Anesthesiologist: Duane Boston, MD Resident/CRNA: Lind Covert, CRNA Performed by: Lind Covert, CRNA Authorized by: Duane Boston, MD   Preanesthetic Checklist Completed: patient identified, IV checked, site marked, risks and benefits discussed, surgical consent, monitors and equipment checked, pre-op evaluation and timeout performed Spinal Block Patient position: sitting Prep: DuraPrep Patient monitoring: cardiac monitor, continuous pulse ox, blood pressure and heart rate Approach: midline Location: L3-4 Injection technique: single-shot Needle Needle type: Pencan  Needle gauge: 24 G Needle length: 10 cm Needle insertion depth: 8 cm Assessment Sensory level: T6 Events: CSF return Additional Notes Timeout performed. Attempt x 2 per CRNA Dr. Tobias Alexander successful on 2nd attempt. Patient position to supine.

## 2022-06-02 NOTE — Transfer of Care (Signed)
Immediate Anesthesia Transfer of Care Note  Patient: Justin Baldwin  Procedure(s) Performed: OPEN BIOPSY RIGHT FEMUR (Right) TOTAL HIP ARTHROPLASTY ANTERIOR APPROACH (Right: Hip)  Patient Location: PACU  Anesthesia Type:Spinal  Level of Consciousness: sedated  Airway & Oxygen Therapy: Patient Spontanous Breathing and Patient connected to face mask oxygen  Post-op Assessment: Report given to RN and Post -op Vital signs reviewed and stable  Post vital signs: Reviewed and stable  Last Vitals:  Vitals Value Taken Time  BP 129/60 06/02/22 1348  Temp    Pulse 70 06/02/22 1350  Resp 14 06/02/22 1350  SpO2 100 % 06/02/22 1350  Vitals shown include unvalidated device data.  Last Pain:  Vitals:   06/02/22 0938  TempSrc:   PainSc: 4          Complications: No notable events documented.

## 2022-06-02 NOTE — Discharge Instructions (Signed)
? ?Dr. Shaasia Odle ?Joint Replacement Specialist ?[facility] ?[address] ?, [place] [address] ?[phone] ? ? ?TOTAL HIP REPLACEMENT POSTOPERATIVE DIRECTIONS ? ? ? ?Hip Rehabilitation, Guidelines Following Surgery  ? ?WEIGHT BEARING ?Weight bearing as tolerated with assist device (walker, cane, etc) as directed, use it as long as suggested by your surgeon or therapist, typically at least 4-6 weeks. ? ?The results of a hip operation are greatly improved after range of motion and muscle strengthening exercises. Follow all safety measures which are given to protect your hip. If any of these exercises cause increased pain or swelling in your joint, decrease the amount until you are comfortable again. Then slowly increase the exercises. Call your caregiver if you have problems or questions.  ? ?HOME CARE INSTRUCTIONS  ?Most of the following instructions are designed to prevent the dislocation of your new hip.  ?Remove items at home which could result in a fall. This includes throw rugs or furniture in walking pathways.  ?Continue medications as instructed at time of discharge. ?You may have some home medications which will be placed on hold until you complete the course of blood thinner medication. ?You may start showering once you are discharged home. Do not remove your dressing. ?Do not put on socks or shoes without following the instructions of your caregivers.   ?Sit on chairs with arms. Use the chair arms to help push yourself up when arising.  ?Arrange for the use of a toilet seat elevator so you are not sitting low.  ?Walk with walker as instructed.  ?You may resume a sexual relationship in one month or when given the OK by your caregiver.  ?Use walker as long as suggested by your caregivers.  ?You may put full weight on your legs and walk as much as is comfortable. ?Avoid periods of inactivity such as sitting longer than an hour when not asleep. This helps prevent blood  clots.  ?You may return to work once you are cleared by your surgeon.  ?Do not drive a car for 6 weeks or until released by your surgeon.  ?Do not drive while taking narcotics.  ?Wear elastic stockings for two weeks following surgery during the day but you may remove then at night.  ?Make sure you keep all of your appointments after your operation with all of your doctors and caregivers. You should call the office at the above phone number and make an appointment for approximately two weeks after the date of your surgery. ?Please pick up a stool softener and laxative for home use as long as you are requiring pain medications. ?ICE to the affected hip every three hours for 30 minutes at a time and then as needed for pain and swelling. Continue to use ice on the hip for pain and swelling from surgery. You may notice swelling that will progress down to the foot and ankle.  This is normal after surgery.  Elevate the leg when you are not up walking on it.   ?It is important for you to complete the blood thinner medication as prescribed by your doctor. ?Continue to use the breathing machine which will help keep your temperature down.  It is common for your temperature to cycle up and down following surgery, especially at night when you are not up moving around and exerting yourself.  The breathing machine keeps your lungs expanded and your temperature down. ? ?RANGE OF MOTION AND STRENGTHENING EXERCISES  ?These exercises are designed to help you   keep full movement of your hip joint. Follow your caregiver's or physical therapist's instructions. Perform all exercises about fifteen times, three times per day or as directed. Exercise both hips, even if you have had only one joint replacement. These exercises can be done on a training (exercise) mat, on the floor, on a table or on a bed. Use whatever works the best and is most comfortable for you. Use music or television while you are exercising so that the exercises are a  pleasant break in your day. This will make your life better with the exercises acting as a break in routine you can look forward to.  ?Lying on your back, slowly slide your foot toward your buttocks, raising your knee up off the floor. Then slowly slide your foot back down until your leg is straight again.  ?Lying on your back spread your legs as far apart as you can without causing discomfort.  ?Lying on your side, raise your upper leg and foot straight up from the floor as far as is comfortable. Slowly lower the leg and repeat.  ?Lying on your back, tighten up the muscle in the front of your thigh (quadriceps muscles). You can do this by keeping your leg straight and trying to raise your heel off the floor. This helps strengthen the largest muscle supporting your knee.  ?Lying on your back, tighten up the muscles of your buttocks both with the legs straight and with the knee bent at a comfortable angle while keeping your heel on the floor.  ? ?SKILLED REHAB INSTRUCTIONS: ?If the patient is transferred to a skilled rehab facility following release from the hospital, a list of the current medications will be sent to the facility for the patient to continue.  When discharged from the skilled rehab facility, please have the facility set up the patient's Home Health Physical Therapy prior to being released. Also, the skilled facility will be responsible for providing the patient with their medications at time of release from the facility to include their pain medication and their blood thinner medication. If the patient is still at the rehab facility at time of the two week follow up appointment, the skilled rehab facility will also need to assist the patient in arranging follow up appointment in our office and any transportation needs. ? ?POST-OPERATIVE OPIOID TAPER INSTRUCTIONS: ?It is important to wean off of your opioid medication as soon as possible. If you do not need pain medication after your surgery it is ok  to stop day one. ?Opioids include: ?Codeine, Hydrocodone(Norco, Vicodin), Oxycodone(Percocet, oxycontin) and hydromorphone amongst others.  ?Long term and even short term use of opiods can cause: ?Increased pain response ?Dependence ?Constipation ?Depression ?Respiratory depression ?And more.  ?Withdrawal symptoms can include ?Flu like symptoms ?Nausea, vomiting ?And more ?Techniques to manage these symptoms ?Hydrate well ?Eat regular healthy meals ?Stay active ?Use relaxation techniques(deep breathing, meditating, yoga) ?Do Not substitute Alcohol to help with tapering ?If you have been on opioids for less than two weeks and do not have pain than it is ok to stop all together.  ?Plan to wean off of opioids ?This plan should start within one week post op of your joint replacement. ?Maintain the same interval or time between taking each dose and first decrease the dose.  ?Cut the total daily intake of opioids by one tablet each day ?Next start to increase the time between doses. ?The last dose that should be eliminated is the evening dose.  ? ? ?MAKE   SURE YOU:  ?Understand these instructions.  ?Will watch your condition.  ?Will get help right away if you are not doing well or get worse. ? ?Pick up stool softner and laxative for home use following surgery while on pain medications. ?Do not remove your dressing. ?The dressing is waterproof--it is OK to take showers. ?Continue to use ice for pain and swelling after surgery. ?Do not use any lotions or creams on the incision until instructed by your surgeon. ?Total Hip Protocol. ? ?

## 2022-06-02 NOTE — H&P (Signed)
TOTAL HIP ADMISSION H&P  Patient is admitted for right total hip arthroplasty, open biopsy right femur.  Subjective:  Chief Complaint: right hip pain  HPI: Justin Baldwin, 46 y.o. male, has a history of pain and functional disability in the right hip(s) due to arthritis and patient has failed non-surgical conservative treatments for greater than 12 weeks to include NSAID's and/or analgesics, corticosteriod injections, flexibility and strengthening excercises, use of assistive devices, and activity modification.  Onset of symptoms was gradual starting >10 years ago with rapidlly worsening course since that time.The patient noted no past surgery on the right hip(s).  Patient currently rates pain in the right hip at 10 out of 10 with activity. Patient has night pain, worsening of pain with activity and weight bearing, trendelenberg gait, pain that interfers with activities of daily living, and pain with passive range of motion. Patient has evidence of subchondral cysts, subchondral sclerosis, periarticular osteophytes, joint space narrowing, and impending pathologic fracture proximal femur  by imaging studies. This condition presents safety issues increasing the risk of falls. This patient has had  h/o H&N cancer with recent bony mets on surveillance PET scan .  There is no current active infection.  Patient Active Problem List   Diagnosis Date Noted   Chronic pain of right hip 05/15/2021   Lymphedema 02/06/2021   Drug induced constipation 01/13/2021   Mucositis due to antineoplastic therapy 01/12/2021   Weight loss, unintentional 01/12/2021   Cancer of tonsillar fossa ([place]) 10/01/2020   Type 2 diabetes mellitus ([place]) 09/03/2019   Gastroesophageal reflux disease 03/06/2019   Atherosclerosis of aorta ([place]) 11/30/2018   Medication monitoring encounter 10/31/2018   Atypical mycobacterium infection    Abscess of buttock, left 09/22/2018   Hydradenitis 09/22/2018   Kidney insufficiency 09/22/2018    Lump 08/31/2018   Deep tissue injury 08/25/2018   Skin rash 07/25/2018   Fatigue 11/08/2017   Vertigo 07/11/2017   Preventative health care 03/29/2016   Insomnia 03/15/2016   Erectile dysfunction 03/15/2016   Past Medical History:  Diagnosis Date   Arthritis    Diabetes mellitus without complication ([place])    Dizziness 11/30/2018   Erectile dysfunction    GERD (gastroesophageal reflux disease)    Insomnia    Skin abnormality    hidradenitis suppurativa    Throat cancer ([place])     Past Surgical History:  Procedure Laterality Date   IR GASTROSTOMY TUBE MOD SED  11/21/2020   IR GASTROSTOMY TUBE REMOVAL  02/27/2021   IR IMAGING GUIDED PORT INSERTION  11/21/2020   IR REMOVAL TUN ACCESS W/ PORT W/O FL MOD SED  06/05/2021   IRRIGATION AND DEBRIDEMENT ABSCESS N/A 09/22/2018   Procedure: IRRIGATION AND DEBRIDEMENT ABSCESS;  Surgeon: Ralene Ok, MD;  Location: [facility];  Service: General;  Laterality: N/A;    Current Facility-Administered Medications  Medication Dose Route Frequency Provider Last Rate Last Admin   0.9 %  sodium chloride infusion   Intravenous Continuous Harmoni Lucus, Aaron Edelman, MD       acetaminophen (TYLENOL) tablet 1,000 mg  1,000 mg Oral Once Duane Boston, MD       ceFAZolin (ANCEF) IVPB 2g/100 mL premix  2 g Intravenous On Call to McGregor, MD       celecoxib (CELEBREX) capsule 200 mg  200 mg Oral Once Duane Boston, MD       chlorhexidine (PERIDEX) 0.12 % solution 15 mL  15 mL Mouth/Throat Once Suzette Battiest, MD       Or  Oral care mouth rinse  15 mL Mouth Rinse Once Suzette Battiest, MD       lactated ringers infusion   Intravenous Continuous Suzette Battiest, MD       povidone-iodine 10 % swab 2 Application  2 Application Topical Once Romero Letizia, Aaron Edelman, MD       povidone-iodine 10 % swab   Topical Once Rod Can, MD       tranexamic acid (CYKLOKAPRON) IVPB 1,000 mg  1,000 mg Intravenous To OR Sheriann Newmann, Aaron Edelman, MD       No Known Allergies  Social  History   Tobacco Use   Smoking status: Every Day    Packs/day: 1.00    Types: Cigarettes    Last attempt to quit: 10/27/2020    Years since quitting: 1.5   Smokeless tobacco: Former    Quit date: 02/14/2016  Substance Use Topics   Alcohol use: Yes    Alcohol/week: 5.0 standard drinks of alcohol    Types: 5 Shots of liquor per week    Comment: occasinal     Family History  Problem Relation Age of Onset   Heart attack Father    Emphysema Paternal Grandmother    Arthritis Mother      Review of Systems  Constitutional: Negative.   HENT: Negative.    Eyes: Negative.   Respiratory: Negative.    Cardiovascular: Negative.   Gastrointestinal: Negative.   Endocrine: Negative.   Genitourinary: Negative.   Musculoskeletal: Negative.   Skin: Negative.   Allergic/Immunologic: Negative.   Neurological: Negative.   Hematological: Negative.   Psychiatric/Behavioral: Negative.      Objective:  Physical Exam Constitutional:      Appearance: Normal appearance.  HENT:     Head: Normocephalic and atraumatic.     Nose: Nose normal.     Mouth/Throat:     Mouth: Mucous membranes are moist.     Pharynx: Oropharynx is clear.  Eyes:     Extraocular Movements: Extraocular movements intact.     Conjunctiva/sclera: Conjunctivae normal.     Pupils: Pupils are equal, round, and reactive to light.  Cardiovascular:     Rate and Rhythm: Normal rate and regular rhythm.     Pulses: Normal pulses.  Pulmonary:     Effort: Pulmonary effort is normal.  Abdominal:     General: Abdomen is flat. Bowel sounds are normal.     Palpations: Abdomen is soft.  Genitourinary:    Comments: deferred Musculoskeletal:     Cervical back: Normal range of motion and neck supple.     Right hip: Bony tenderness present. Decreased range of motion.  Skin:    General: Skin is warm and dry.     Capillary Refill: Capillary refill takes less than 2 seconds.  Neurological:     General: No focal deficit present.      Mental Status: He is alert and oriented to person, place, and time.  Psychiatric:        Mood and Affect: Mood normal.        Behavior: Behavior normal.        Thought Content: Thought content normal.        Judgment: Judgment normal.     Vital signs in last 24 hours: Temp:  [98.4 F (36.9 C)] 98.4 F (36.9 C) (08/09 0927) Pulse Rate:  [71] 71 (08/09 0927) Resp:  [12] 12 (08/09 0927) BP: (145)/(97) 145/97 (08/09 0927) SpO2:  [96 %] 96 % (08/09 0927)  Labs:   Estimated body  mass index is 27.71 kg/m as calculated from the following:   Height as of 05/31/22: 6' (1.829 m).   Weight as of 05/31/22: 92.7 kg.   Imaging Review Plain radiographs demonstrate severe degenerative joint disease of the right hip(s). The bone quality appears to be adequate for age and reported activity level. MRI showed impending pathologic fracture proximal femur      Assessment/Plan:  End stage arthritis, right hip(s) Lytic bone lesion proximal femur H/o H&N cancer  The patient history, physical examination, clinical judgement of the provider and imaging studies are consistent with end stage degenerative joint disease of the right hip(s) and open biopsy right femur, total hip arthroplasty is deemed medically necessary. The treatment options including medical management, injection therapy, arthroscopy and arthroplasty were discussed at length. The risks and benefits of total hip arthroplasty were presented and reviewed. The risks due to aseptic loosening, infection, stiffness, dislocation/subluxation,  thromboembolic complications and other imponderables were discussed.  The patient acknowledged the explanation, agreed to proceed with the plan and consent was signed. Patient is being admitted for inpatient treatment for surgery, pain control, PT, OT, prophylactic antibiotics, VTE prophylaxis, progressive ambulation and ADL's and discharge planning.The patient is planning to be discharged  home with  HEP    Patient's anticipated LOS is less than 2 midnights, meeting these requirements: - Younger than 5 - Lives within 1 hour of care - Has a competent adult at home to recover with post-op recover - NO history of  - Chronic pain requiring opiods  - Diabetes  - Coronary Artery Disease  - Heart failure  - Heart attack  - Stroke  - DVT/VTE  - Cardiac arrhythmia  - Respiratory Failure/COPD  - Renal failure  - Anemia  - Advanced Liver disease

## 2022-06-02 NOTE — Progress Notes (Signed)
Pacu Discharge Note  Patient instuctions were given to family. Wound care, diet, pain, follow up care and how and whom to contact with concerns were discussed. Family aware someone needs to remain with patient overnight and concerns after receiving anesthesia and what to avoid and safety. Answered all questions and concerns.   Discharge paperwork has clear contact informations for surgeon and 24 hour RN line for concerns.   Discussed what concerns to look for including infection and signs/symptoms to look for.   IV was removed prior to discharge. Patient was brought to car with belongings.   Discussed that pt should be wearing teds daily, may remove at night for sleep, use walker for ambulation, elevate R leg to decrease swelling, pick up prescriptions at 2 different pharmacies. Discussed taking baby Aspirin daily if he cannot fill his Eliquis.   Pt exits my care.

## 2022-06-02 NOTE — Op Note (Signed)
OPERATIVE REPORT  SURGEON: Rod Can, MD   ASSISTANT: Larene Pickett, PA-C.  PREOPERATIVE DIAGNOSIS: Primary Right hip arthritis. Impending pathologic fracture, right proximal femur.   POSTOPERATIVE DIAGNOSIS: Primary Right hip arthritis. Impending pathologic fracture, right proximal femur.   PROCEDURE: 1. Open deep biopsy right proximal femur. 2. Right total hip arthroplasty, anterior approach.   IMPLANTS: Biomet Arcos One Piece collared stem, size 16 x 175 mm, high offset. Biomet G7 OsseoTi Cup, size 54 mm. Biomet Vivacit-E liner, size 36 mm, F, neutral. Biomet Biolox ceramic head ball, size 36 + 6 mm.  ANESTHESIA:  MAC and Spinal  ESTIMATED BLOOD LOSS:-400 mL    ANTIBIOTICS: 2g Ancef.  SPECIMENS: Right proximal femur bone lesion for pathology; frozen section confirmed metastatic carcinoma.  DRAINS: None.  COMPLICATIONS: None.   CONDITION: PACU - hemodynamically stable.   BRIEF CLINICAL NOTE: Justin Baldwin is a 46 y.o. male with a long-standing history of Right hip arthritis. After failing conservative management, the patient was indicated for total hip arthroplasty. He also has a history of head and neck cancer diagnosed in 2021. A recent surveillance PET scan was positive for several bone lesions including the proximal right femur.  An MRI of the right femur was obtained, confirming that there were no additional metastatic lesions to the right femur or right acetabulum.  The risks, benefits, and alternatives to open biopsy and right total hip arthroplasty were explained, and the patient elected to proceed.  PROCEDURE IN DETAIL: Surgical site was marked by myself in the pre-op holding area. Once inside the operating room, spinal anesthesia was obtained, and a foley catheter was inserted. The patient was then positioned on the Hana table.  All bony prominences were well padded.  The hip was prepped and draped in the normal sterile surgical fashion.  A time-out was called  verifying side and site of surgery. The patient received IV antibiotics within 60 minutes of beginning the procedure.   Bikini incision was made, and superficial dissection was performed lateral to the ASIS. The direct anterior approach to the hip was performed through the Hueter interval.  Lateral femoral circumflex vessels were treated with the Auqumantys. The anterior capsule was exposed and an inverted T capsulotomy was made. The femoral neck cut was made to the level of the templated cut.  A corkscrew was placed into the head and the head was removed.  The femoral head was found to have eburnated bone. The head was passed to the back table and was measured. Pubofemoral ligament was released off of the calcar, taking care to stay on bone. Superior capsule was released from the greater trochanter, taking care to stay lateral to the posterior border of the femoral neck in order to preserve the short external rotators.  I then gain femoral exposure, taking care to protect the abductors and greater trochanter.  Exposure was achieved using external rotation, extension, and adduction.  Superior capsule was incised longitudinally, taking care to stay lateral to the posterior border of the femoral neck.  Using a rongeur, I removed lateral cortical bone near the greater trochanter.  I also removed cancellous bone in the femoral neck.  I then used a curette to enter the area containing the previously identified bone lesion in an antegrade fashion.  I encountered tannish material consistent with tumor.  Representative sample was sent to pathology.  The pathologist called shortly thereafter after performing a frozen section, confirming metastatic carcinoma.  I then proceeded with total hip arthroplasty.   Acetabular  exposure was achieved, and the pulvinar and labrum were excised. Sequential reaming of the acetabulum was then performed up to a size 53 mm reamer. A 54 mm cup was then opened and impacted into place at  approximately 40 degrees of abduction and 20 degrees of anteversion. The final polyethylene liner was impacted into place and acetabular osteophytes were removed.    Femoral exposure was then regained.  The femoral canal finder was placed.  Sequential reaming was performed up to 15.5 mm, and broaching was performed up to a size 16.  Calcar planer was used on the femoral neck remnant.  I placed a high offset neck and a trial head ball.  The hip was reduced.  Leg lengths and offset were checked fluoroscopically.  The hip was dislocated and trial components were removed.  The final implants were placed, and the hip was reduced.  Fluoroscopy was used to confirm component position and leg lengths.  At 90 degrees of external rotation and full extension, the hip was stable to an anterior directed force.   The wound was copiously irrigated with Prontosan solution and normal saline using pule lavage.  Marcaine solution was injected into the periarticular soft tissue.  The wound was closed in layers using #1 Vicryl and V-Loc for the fascia, 2-0 Vicryl for the subcutaneous fat, 2-0 Monocryl for the deep dermal layer, 3-0 running Monocryl subcuticular stitch, and Dermabond for the skin.  Once the glue was fully dried, an Aquacell Ag dressing was applied.  The patient was transported to the recovery room in stable condition.  Sponge, needle, and instrument counts were correct at the end of the case x2.  The patient tolerated the procedure well and there were no known complications.  Please note that a surgical assistant was a medical necessity for this procedure to perform it in a safe and expeditious manner. Assistant was necessary to provide appropriate retraction of vital neurovascular structures, to prevent femoral fracture, and to allow for anatomic placement of the prosthesis.

## 2022-06-03 ENCOUNTER — Encounter (HOSPITAL_COMMUNITY): Payer: Self-pay | Admitting: Orthopedic Surgery

## 2022-06-03 NOTE — Anesthesia Postprocedure Evaluation (Signed)
Anesthesia Post Note  Patient: Justin Baldwin  Procedure(s) Performed: OPEN BIOPSY RIGHT FEMUR (Right) TOTAL HIP ARTHROPLASTY ANTERIOR APPROACH (Right: Hip)     Patient location during evaluation: PACU Anesthesia Type: Spinal Level of consciousness: awake and alert Pain management: pain level controlled Vital Signs Assessment: post-procedure vital signs reviewed and stable Respiratory status: spontaneous breathing, nonlabored ventilation and respiratory function stable Cardiovascular status: blood pressure returned to baseline and stable Postop Assessment: no apparent nausea or vomiting Anesthetic complications: no   No notable events documented.  Last Vitals:  Vitals:   06/02/22 1645 06/02/22 1745  BP: 139/89 135/86  Pulse: 66 68  Resp:    Temp: 36.6 C   SpO2: 98% 98%    Last Pain:  Vitals:   06/02/22 1745  TempSrc:   PainSc: 3    Pain Goal: Patients Stated Pain Goal: 3 (06/02/22 1745)                 Lynda Rainwater

## 2022-06-06 ENCOUNTER — Encounter: Payer: Self-pay | Admitting: Hematology and Oncology

## 2022-06-07 ENCOUNTER — Telehealth: Payer: Self-pay

## 2022-06-07 NOTE — Telephone Encounter (Signed)
Spoke with patient regarding Palliative Care services. He declined services at this time. Will cancel referral and notify referring provider.  

## 2022-06-08 ENCOUNTER — Ambulatory Visit (HOSPITAL_BASED_OUTPATIENT_CLINIC_OR_DEPARTMENT_OTHER): Payer: BC Managed Care – PPO | Admitting: Hematology and Oncology

## 2022-06-08 ENCOUNTER — Encounter: Payer: Self-pay | Admitting: Hematology and Oncology

## 2022-06-08 DIAGNOSIS — C7951 Secondary malignant neoplasm of bone: Secondary | ICD-10-CM

## 2022-06-08 NOTE — Progress Notes (Signed)
[facility] Follow up:    Justin [name], NP Justin Baldwin   DIAGNOSIS:  Cancer Staging  Cancer of tonsillar fossa ([place]) Staging form: Pharynx - HPV-Mediated Oropharynx, AJCC 8th Edition - Clinical stage from 10/01/2020: Stage II (cT3, cN2, cM0, p16+) - Signed by Eppie Gibson, MD on 02/11/2021 Stage prefix: Initial diagnosis   SUMMARY OF ONCOLOGIC HISTORY: Oncology History  Cancer of tonsillar fossa ([place])  08/2020 Initial Diagnosis   Justin Baldwin is a 46 y.o. male who presented with ongoing head cold symptoms, sinus issues, left ear pain, and left-sided throat pain since September.  When the cold-like symptoms resolved, the throat and ear pain did not. Augmentin was prescribed without relief.   09/19/2020 Initial Biopsy   Subsequently, the patient saw Dr. Redmond Baseman, ENT at [facility], who noted that the left tonsil had a mass appearance on 09/19/2020. 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   09/30/2020 Imaging   Pertinent imaging thus far includes soft tissue neck CT scan on 09/30/2020 revealed a left tonsillar mass abutting the glossotonsillar sulcus and base of the tongue, consistent with known malignancy. Left level 2/3 cystic or necrotic adenopathy reflected nodal metastasis.   CT scan of chest performed on 09/30/2020 revealing a non-specific left lower lobe pulmonary micronodule but no acute intrathoracic abnormality and no definite intrathoracic metastasis.  I have personally reviewed his imaging and discussed him with Dr. Redmond Baseman.     10/01/2020 Cancer Staging   Staging form: Pharynx - HPV-Mediated Oropharynx, AJCC 8th Edition - Clinical stage from 10/01/2020: Stage II (cT3, cN2, cM0, p16+) - Signed by Eppie Gibson, MD on 02/11/2021 Stage prefix: Initial diagnosis   10/10/2020 PET scan   He had a PET scan which showed hypermetabolic left tonsillar mass and bilateral lymphadenopathy  consistent with metastatic disease.  Hence recommendation was to proceed with concurrent chemoradiation.   10/28/2020 - 12/15/2020 Radiation Therapy   10/28/2020 through 12/15/2020 Site Technique Total Dose (Gy) Dose per Fx (Gy) Completed Fx Beam Energies  Tonsil, Left: [id number]_tonsil IMRT 70/70 2 35/35 6X     11/07/2020 - 12/03/2020 Chemotherapy   Completed 2 cycles of cisplatin at 100 mg/m2. (Chemo start delayed secondary to him having COVID19)       04/10/2021 PET scan   1. Interval resolution of previous FDG uptake associated with left tonsillar mass and FDG avid left level 2/3 cervical lymph node. No new sites of disease identified. 2. New long segment of increased uptake localizing to the esophagus. Correlate for any clinical signs or symptoms of esophagitis. 3.  Aortic Atherosclerosis (ICD10-I70.0).        CURRENT THERAPY: Observation  INTERVAL HISTORY: Justin Baldwin 46 y.o. male returns for f/u regarding his recent ER visit for pelvic pain.  He underwent CT pelvis during his ER visit on 7/16 that demonstrated:  IMPRESSION: 1. No acute fracture or dislocation. 2. Moderate arthritic changes of the right hip. 3. Lucent lesions in the right femoral neck and distal sacrum. Further characterization with MRI on a nonemergent/outpatient basis recommended.  He today reports no significant pain.  It just feels sore in his tailbone.  He never had any pain in his groin.  He also recently had some cortisone shot to his right hip for arthritis.  He denies any other complaints.  He has recovered completely from chemoradiation except for some dysgeusia for certain foods. Rest of the pertinent 10  point ROS reviewed and negative  Patient Active Problem List   Diagnosis Date Noted  . Primary osteoarthritis of right hip 06/02/2022  . Lytic bone lesion of right femur 06/02/2022  . Chronic pain of right hip 05/15/2021  . Lymphedema 02/06/2021  . Drug induced constipation 01/13/2021  . Mucositis  due to antineoplastic therapy 01/12/2021  . Weight loss, unintentional 01/12/2021  . Cancer of tonsillar fossa ([place]) 10/01/2020  . Type 2 diabetes mellitus ([place]) 09/03/2019  . Gastroesophageal reflux disease 03/06/2019  . Atherosclerosis of aorta ([place]) 11/30/2018  . Medication monitoring encounter 10/31/2018  . Atypical mycobacterium infection   . Abscess of buttock, left 09/22/2018  . Hydradenitis 09/22/2018  . Kidney insufficiency 09/22/2018  . Lump 08/31/2018  . Deep tissue injury 08/25/2018  . Skin rash 07/25/2018  . Fatigue 11/08/2017  . Vertigo 07/11/2017  . Preventative health care 03/29/2016  . Insomnia 03/15/2016  . Erectile dysfunction 03/15/2016    has No Known Allergies.  MEDICAL HISTORY: Past Medical History:  Diagnosis Date  . Arthritis   . Diabetes mellitus without complication ([place])   . Dizziness 11/30/2018  . Erectile dysfunction   . GERD (gastroesophageal reflux disease)   . Insomnia   . Skin abnormality    hidradenitis suppurativa   . Throat cancer [facility])     SURGICAL HISTORY: Past Surgical History:  Procedure Laterality Date  . BONE BIOPSY Right 06/02/2022   Procedure: OPEN BIOPSY RIGHT FEMUR;  Surgeon: Rod Can, MD;  Location: [facility];  Service: Orthopedics;  Laterality: Right;  . IR GASTROSTOMY TUBE MOD SED  11/21/2020  . IR GASTROSTOMY TUBE REMOVAL  02/27/2021  . IR IMAGING GUIDED PORT INSERTION  11/21/2020  . IR REMOVAL TUN ACCESS W/ PORT W/O FL MOD SED  06/05/2021  . IRRIGATION AND DEBRIDEMENT ABSCESS N/A 09/22/2018   Procedure: IRRIGATION AND DEBRIDEMENT ABSCESS;  Surgeon: Ralene Ok, MD;  Location: [facility];  Service: General;  Laterality: N/A;  . TOTAL HIP ARTHROPLASTY Right 06/02/2022   Procedure: TOTAL HIP ARTHROPLASTY ANTERIOR APPROACH;  Surgeon: Rod Can, MD;  Location: [facility];  Service: Orthopedics;  Laterality: Right;    SOCIAL HISTORY: Social History   Socioeconomic History  . Marital status: Married    Spouse name:  Not on file  . Number of children: Not on file  . Years of education: Not on file  . Highest education level: Not on file  Occupational History  . Not on file  Tobacco Use  . Smoking status: Every Day    Packs/day: 1.00    Types: Cigarettes    Last attempt to quit: 10/27/2020    Years since quitting: 1.6  . Smokeless tobacco: Former    Quit date: 02/14/2016  Vaping Use  . Vaping Use: Never used  Substance and Sexual Activity  . Alcohol use: Yes    Alcohol/week: 5.0 standard drinks of alcohol    Types: 5 Shots of liquor per week    Comment: occasinal   . Drug use: No  . Sexual activity: Yes  Other Topics Concern  . Not on file  Social History Narrative   ** Merged History Encounter **       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    Social Determinants of Health   Financial Resource Strain: Low Risk  (10/01/2020)   Overall Financial Resource Strain (CARDIA)   . Difficulty of Paying Living Expenses: Not hard at all  Food Insecurity: No Food Insecurity (  10/01/2020)   Hunger Vital Sign   . Worried About Charity fundraiser in the Last Year: Never true   . Ran Out of Food in the Last Year: Never true  Transportation Needs: No Transportation Needs (10/01/2020)   PRAPARE - Transportation   . Lack of Transportation (Medical): No   . Lack of Transportation (Non-Medical): No  Physical Activity: Not on file  Stress: Not on file  Social Connections: Not on file  Intimate Partner Violence: Not on file    FAMILY HISTORY: Family History  Problem Relation Age of Onset  . Heart attack Father   . Emphysema Paternal Grandmother   . Arthritis Mother     Review of Systems  Constitutional:  Negative for appetite change, chills, fatigue, fever and unexpected weight change.  HENT:   Negative for hearing loss, lump/mass and trouble swallowing.   Eyes:  Negative for eye problems and icterus.  Respiratory:  Negative for chest tightness, cough and  shortness of breath.   Cardiovascular:  Negative for chest pain, leg swelling and palpitations.  Gastrointestinal:  Negative for abdominal distention, abdominal pain, constipation, diarrhea, nausea and vomiting.  Endocrine: Negative for hot flashes.  Genitourinary:  Negative for difficulty urinating.   Musculoskeletal:  Negative for arthralgias.  Skin:  Negative for itching and rash.  Neurological:  Negative for dizziness, extremity weakness, headaches and numbness.  Hematological:  Negative for adenopathy. Does not bruise/bleed easily.  Psychiatric/Behavioral:  Negative for depression. The patient is not nervous/anxious.       PHYSICAL EXAMINATION  ECOG PERFORMANCE STATUS: 0 - Asymptomatic  There were no vitals filed for this visit.   Physical Exam Constitutional:      Appearance: Normal appearance.  HENT:     Head: Normocephalic and atraumatic.     Mouth/Throat:     Mouth: Mucous membranes are moist.     Comments: No tonsillar masses noted on exam Pulmonary:     Effort: Pulmonary effort is normal.     Breath sounds: Normal breath sounds.  Musculoskeletal:     Cervical back: Normal range of motion and neck supple. No rigidity.  Lymphadenopathy:     Cervical: No cervical adenopathy.  Skin:    General: Skin is warm and dry.  Neurological:     General: No focal deficit present.     Mental Status: He is alert.  Psychiatric:        Mood and Affect: Mood normal.     LABORATORY DATA:  CBC    Component Value Date/Time   WBC 8.2 05/21/2022 1017   RBC 4.95 05/21/2022 1017   HGB 15.9 05/21/2022 1017   HGB 13.5 12/11/2020 0816   HCT 46.9 05/21/2022 1017   PLT 248 05/21/2022 1017   PLT 190 12/11/2020 0816   MCV 94.7 05/21/2022 1017   MCH 32.1 05/21/2022 1017   MCHC 33.9 05/21/2022 1017   RDW 14.6 05/21/2022 1017   LYMPHSABS 1.1 05/21/2022 1017   MONOABS 0.7 05/21/2022 1017   EOSABS 0.1 05/21/2022 1017   BASOSABS 0.1 05/21/2022 1017    CMP     Component Value  Date/Time   NA 136 05/31/2022 1313   NA 135 09/12/2019 1048   K 3.9 05/31/2022 1313   CL 103 05/31/2022 1313   CO2 23 05/31/2022 1313   GLUCOSE 114 (H) 05/31/2022 1313   BUN 15 05/31/2022 1313   BUN 18 09/12/2019 1048   CREATININE 1.26 (H) 05/31/2022 1313   CREATININE 1.28 (H) 12/11/2020 5035  CREATININE 1.38 (H) 11/30/2018 1102   CALCIUM 9.1 05/31/2022 1313   PROT 5.8 (L) 05/29/2021 1937   ALBUMIN 3.5 05/29/2021 1937   AST 20 05/29/2021 1937   ALT 23 05/29/2021 1937   ALKPHOS 26 (L) 05/29/2021 1937   BILITOT 0.6 05/29/2021 1937   GFRNONAA >60 05/31/2022 1313   GFRNONAA >60 12/11/2020 0816   GFRNONAA 63 11/30/2018 1102   GFRAA 72 09/12/2019 1048   GFRAA 73 11/30/2018 1102        ASSESSMENT and THERAPY PLAN:   No problem-specific Assessment & Plan notes found for this encounter.  All questions were answered. The patient knows to call the clinic with any problems, questions or concerns. We can certainly see the patient much sooner if necessary.  Total encounter time:30 minutes*in face-to-face visit time, chart review, lab review, care coordination, order entry, and documentation of the encounter time.  *Total Encounter Time as defined by the Centers for Medicare and Medicaid Services includes, in addition to the face-to-face time of a patient visit (documented in the note above) non-face-to-face time: obtaining and reviewing outside history, ordering and reviewing medications, tests or procedures, care coordination (communications with other health care professionals or caregivers) and documentation in the medical record.

## 2022-06-09 ENCOUNTER — Encounter: Payer: Self-pay | Admitting: Hematology and Oncology

## 2022-06-09 DIAGNOSIS — C7951 Secondary malignant neoplasm of bone: Secondary | ICD-10-CM | POA: Insufficient documentation

## 2022-06-09 NOTE — Assessment & Plan Note (Signed)
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  Most recent PET scan in 03/2021 shows resolution of FDG uptake.    He has had new hip and tailbone pain, and CT pelvis indeterminate.  PET/CT suggested possibility of metastatic disease.  He recently had total hip arthroplasty on the right side anterior approach as well as deep biopsy of the right proximal femur with suggested poorly differentiated squamous cell carcinoma, questionable pulmonary origin however other sites cannot be excluded.  Patient left is a message saying he read the pathology report, he reviewed this with a friend of his who also agreed that HPV related oropharyngeal cancer is rarely metastasized to the bone and this could indeed be pulmonary origin.  We have clearly discussed that there is no lesion in the lung that suggests a pulmonary primary.  It also appears to be poorly differentiated and sometimes it is not possible to determine the site of origin.  Given his history of HPV related oropharyngeal squamous cell carcinoma and now metastatic disease, I believe this is likely metastatic squamous cell carcinoma.  Since patient appears to be very concerned about the pathology findings, we can attempt HPV testing on the metastatic specimen as well as send it for [facility] to look at other targeted therapies.  We have briefly discussed that in head and neck cancer we do recommend frontline chemoimmunotherapy and this is metastatic disease and the intent of treatment is palliative.  He understood the recommendations.  He will return to clinic in 1 week. He will have to complete postop radiation before we can move forward with systemic therapy.  He has an appointment set up with Dr. Isidore Moos.Marland Kitchen

## 2022-06-10 LAB — SURGICAL PATHOLOGY

## 2022-06-15 ENCOUNTER — Other Ambulatory Visit: Payer: Self-pay

## 2022-06-15 ENCOUNTER — Encounter: Payer: Self-pay | Admitting: Hematology and Oncology

## 2022-06-15 ENCOUNTER — Inpatient Hospital Stay: Payer: BC Managed Care – PPO | Attending: Adult Health | Admitting: Hematology and Oncology

## 2022-06-15 DIAGNOSIS — Z96641 Presence of right artificial hip joint: Secondary | ICD-10-CM | POA: Diagnosis not present

## 2022-06-15 DIAGNOSIS — C090 Malignant neoplasm of tonsillar fossa: Secondary | ICD-10-CM | POA: Diagnosis not present

## 2022-06-15 DIAGNOSIS — Z9221 Personal history of antineoplastic chemotherapy: Secondary | ICD-10-CM | POA: Diagnosis not present

## 2022-06-15 DIAGNOSIS — C7951 Secondary malignant neoplasm of bone: Secondary | ICD-10-CM | POA: Diagnosis not present

## 2022-06-15 DIAGNOSIS — Z923 Personal history of irradiation: Secondary | ICD-10-CM | POA: Insufficient documentation

## 2022-06-15 DIAGNOSIS — G893 Neoplasm related pain (acute) (chronic): Secondary | ICD-10-CM | POA: Insufficient documentation

## 2022-06-15 DIAGNOSIS — Z79891 Long term (current) use of opiate analgesic: Secondary | ICD-10-CM | POA: Diagnosis not present

## 2022-06-15 DIAGNOSIS — Z471 Aftercare following joint replacement surgery: Secondary | ICD-10-CM | POA: Diagnosis not present

## 2022-06-15 NOTE — Progress Notes (Signed)
[facility] Follow up:    Justin [name], NP [place] [id number]   DIAGNOSIS:  Cancer Staging  Cancer of tonsillar fossa ([place]) Staging form: Pharynx - HPV-Mediated Oropharynx, AJCC 8th Edition - Clinical stage from 10/01/2020: Stage II (cT3, cN2, cM0, p16+) - Signed by Eppie Gibson, MD on 02/11/2021 Stage prefix: Initial diagnosis   SUMMARY OF ONCOLOGIC HISTORY: Oncology History  Cancer of tonsillar fossa ([place])  08/2020 Initial Diagnosis   Justin Baldwin is a 46 y.o. male who presented with ongoing head cold symptoms, sinus issues, left ear pain, and left-sided throat pain since September.  When the cold-like symptoms resolved, the throat and ear pain did not. Augmentin was prescribed without relief.   09/19/2020 Initial Biopsy   Subsequently, the patient saw Dr. Redmond Baseman, ENT at [facility], who noted that the left tonsil had a mass appearance on 09/19/2020. 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   09/30/2020 Imaging   Pertinent imaging thus far includes soft tissue neck CT scan on 09/30/2020 revealed a left tonsillar mass abutting the glossotonsillar sulcus and base of the tongue, consistent with known malignancy. Left level 2/3 cystic or necrotic adenopathy reflected nodal metastasis.   CT scan of chest performed on 09/30/2020 revealing a non-specific left lower lobe pulmonary micronodule but no acute intrathoracic abnormality and no definite intrathoracic metastasis.  I have personally reviewed his imaging and discussed him with Dr. Redmond Baseman.     10/01/2020 Cancer Staging   Staging form: Pharynx - HPV-Mediated Oropharynx, AJCC 8th Edition - Clinical stage from 10/01/2020: Stage II (cT3, cN2, cM0, p16+) - Signed by Eppie Gibson, MD on 02/11/2021 Stage prefix: Initial diagnosis   10/10/2020 PET scan   He had a PET scan which showed hypermetabolic left tonsillar mass and bilateral lymphadenopathy  consistent with metastatic disease.  Hence recommendation was to proceed with concurrent chemoradiation.   10/28/2020 - 12/15/2020 Radiation Therapy   10/28/2020 through 12/15/2020 Site Technique Total Dose (Gy) Dose per Fx (Gy) Completed Fx Beam Energies  Tonsil, Left: [id number]_tonsil IMRT 70/70 2 35/35 6X    11/07/2020 - 12/03/2020 Chemotherapy   Completed 2 cycles of cisplatin at 100 mg/m2. (Chemo start delayed secondary to him having COVID19)       04/10/2021 PET scan   1. Interval resolution of previous FDG uptake associated with left tonsillar mass and FDG avid left level 2/3 cervical lymph node. No new sites of disease identified. 2. New long segment of increased uptake localizing to the esophagus. Correlate for any clinical signs or symptoms of esophagitis. 3.  Aortic Atherosclerosis (ICD10-I70.0).      CURRENT THERAPY: Observation  INTERVAL HISTORY:  Justin Baldwin 46 y.o. male returns for f/u regarding his recent ER visit for pelvic pain.  He underwent CT pelvis during his ER visit on 7/16 that demonstrated:  IMPRESSION: 1. No acute fracture or dislocation. 2. Moderate arthritic changes of the right hip. 3. Lucent lesions in the right femoral neck and distal sacrum. Further characterization with MRI on a nonemergent/outpatient basis recommended.  He recently had right total hip arthroplasty, anterior approach as well as deep biopsy of the right proximal femur.  Still recovering from the procedure.  Surgical pathology from August 9 commented on metastatic poorly differentiated squamous cell carcinoma with stains strongly positive for pancytokeratin as well as cytokeratin.  The immunohistochemical pattern suggested consistent with poorly differentiated squamous cell carcinoma possible pulmonary origin  however other sites cannot be excluded.  He continues to recover.  He has his follow-up with Dr. Isidore Moos on September 6 and is anticipating some postop radiation.  He is very worried about  future treatment plans, role of molecular testing etc.  Patient Active Problem List   Diagnosis Date Noted   Metastatic squamous cell carcinoma to bone ([place]) 06/09/2022   Primary osteoarthritis of right hip 06/02/2022   Lytic bone lesion of right femur 06/02/2022   Chronic pain of right hip 05/15/2021   Lymphedema 02/06/2021   Drug induced constipation 01/13/2021   Mucositis due to antineoplastic therapy 01/12/2021   Weight loss, unintentional 01/12/2021   Cancer of tonsillar fossa ([place]) 10/01/2020   Type 2 diabetes mellitus ([place]) 09/03/2019   Gastroesophageal reflux disease 03/06/2019   Atherosclerosis of aorta ([place]) 11/30/2018   Medication monitoring encounter 10/31/2018   Atypical mycobacterium infection    Abscess of buttock, left 09/22/2018   Hydradenitis 09/22/2018   Kidney insufficiency 09/22/2018   Lump 08/31/2018   Deep tissue injury 08/25/2018   Skin rash 07/25/2018   Fatigue 11/08/2017   Vertigo 07/11/2017   Preventative health care 03/29/2016   Insomnia 03/15/2016   Erectile dysfunction 03/15/2016    has No Known Allergies.  MEDICAL HISTORY: Past Medical History:  Diagnosis Date   Arthritis    Diabetes mellitus without complication ([place])    Dizziness 11/30/2018   Erectile dysfunction    GERD (gastroesophageal reflux disease)    Insomnia    Skin abnormality    hidradenitis suppurativa    Throat cancer [facility])     SURGICAL HISTORY: Past Surgical History:  Procedure Laterality Date   BONE BIOPSY Right 06/02/2022   Procedure: OPEN BIOPSY RIGHT FEMUR;  Surgeon: Rod Can, MD;  Location: [facility];  Service: Orthopedics;  Laterality: Right;   IR GASTROSTOMY TUBE MOD SED  11/21/2020   IR GASTROSTOMY TUBE REMOVAL  02/27/2021   IR IMAGING GUIDED PORT INSERTION  11/21/2020   IR REMOVAL TUN ACCESS W/ PORT W/O FL MOD SED  06/05/2021   IRRIGATION AND DEBRIDEMENT ABSCESS N/A 09/22/2018   Procedure: IRRIGATION AND DEBRIDEMENT ABSCESS;  Surgeon: Ralene Ok, MD;   Location: [facility];  Service: General;  Laterality: N/A;   TOTAL HIP ARTHROPLASTY Right 06/02/2022   Procedure: TOTAL HIP ARTHROPLASTY ANTERIOR APPROACH;  Surgeon: Rod Can, MD;  Location: [facility];  Service: Orthopedics;  Laterality: Right;    SOCIAL HISTORY: Social History   Socioeconomic History   Marital status: Married    Spouse name: Not on file   Number of children: Not on file   Years of education: Not on file   Highest education level: Not on file  Occupational History   Not on file  Tobacco Use   Smoking status: Every Day    Packs/day: 1.00    Types: Cigarettes    Last attempt to quit: 10/27/2020    Years since quitting: 1.6   Smokeless tobacco: Former    Quit date: 02/14/2016  Vaping Use   Vaping Use: Never used  Substance and Sexual Activity   Alcohol use: Yes    Alcohol/week: 5.0 standard drinks of alcohol    Types: 5 Shots of liquor per week    Comment: occasinal    Drug use: No   Sexual activity: Yes  Other Topics Concern   Not on file  Social History Narrative   ** Merged History Encounter **       Married. 2 children. 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    Social Determinants of Health   Financial Resource Strain: Low Risk  (10/01/2020)   Overall Financial Resource Strain (CARDIA)    Difficulty of Paying Living Expenses: Not hard at all  Food Insecurity: No Food Insecurity (10/01/2020)   Hunger Vital Sign    Worried About Running Out of Food in the Last Year: Never true    Ran Out of Food in the Last Year: Never true  Transportation Needs: No Transportation Needs (10/01/2020)   PRAPARE - Hydrologist (Medical): No    Lack of Transportation (Non-Medical): No  Physical Activity: Not on file  Stress: Not on file  Social Connections: Not on file  Intimate Partner Violence: Not on file    FAMILY HISTORY: Family History  Problem Relation Age of Onset   Heart attack Father    Emphysema Paternal  Grandmother    Arthritis Mother     Review of Systems  Constitutional:  Negative for appetite change, chills, fatigue, fever and unexpected weight change.  HENT:   Negative for hearing loss, lump/mass and trouble swallowing.   Eyes:  Negative for eye problems and icterus.  Respiratory:  Negative for chest tightness, cough and shortness of breath.   Cardiovascular:  Negative for chest pain, leg swelling and palpitations.  Gastrointestinal:  Negative for abdominal distention, abdominal pain, constipation, diarrhea, nausea and vomiting.  Endocrine: Negative for hot flashes.  Genitourinary:  Negative for difficulty urinating.   Musculoskeletal:  Negative for arthralgias.  Skin:  Negative for itching and rash.  Neurological:  Negative for dizziness, extremity weakness, headaches and numbness.  Hematological:  Negative for adenopathy. Does not bruise/bleed easily.  Psychiatric/Behavioral:  Negative for depression. The patient is not nervous/anxious.       PHYSICAL EXAMINATION  ECOG PERFORMANCE STATUS: 0 - Asymptomatic  Vitals:   06/15/22 0953  BP: 124/72  Pulse: 77  Resp: 16  Temp: 98.1 F (36.7 C)  SpO2: 96%   General appearance: Alert oriented and in no acute distress, walks with a cane Lower extremities: No lower extremity edema  LABORATORY DATA:  CBC    Component Value Date/Time   WBC 8.2 05/21/2022 1017   RBC 4.95 05/21/2022 1017   HGB 15.9 05/21/2022 1017   HGB 13.5 12/11/2020 0816   HCT 46.9 05/21/2022 1017   PLT 248 05/21/2022 1017   PLT 190 12/11/2020 0816   MCV 94.7 05/21/2022 1017   MCH 32.1 05/21/2022 1017   MCHC 33.9 05/21/2022 1017   RDW 14.6 05/21/2022 1017   LYMPHSABS 1.1 05/21/2022 1017   MONOABS 0.7 05/21/2022 1017   EOSABS 0.1 05/21/2022 1017   BASOSABS 0.1 05/21/2022 1017    CMP     Component Value Date/Time   NA 136 05/31/2022 1313   NA 135 09/12/2019 1048   K 3.9 05/31/2022 1313   CL 103 05/31/2022 1313   CO2 23 05/31/2022 1313    GLUCOSE 114 (H) 05/31/2022 1313   BUN 15 05/31/2022 1313   BUN 18 09/12/2019 1048   CREATININE 1.26 (H) 05/31/2022 1313   CREATININE 1.28 (H) 12/11/2020 0816   CREATININE 1.38 (H) 11/30/2018 1102   CALCIUM 9.1 05/31/2022 1313   PROT 5.8 (L) 05/29/2021 1937   ALBUMIN 3.5 05/29/2021 1937   AST 20 05/29/2021 1937   ALT 23 05/29/2021 1937   ALKPHOS 26 (L) 05/29/2021 1937   BILITOT 0.6 05/29/2021 1937   GFRNONAA >60 05/31/2022 1313   GFRNONAA >  60 12/11/2020 0816   GFRNONAA 63 11/30/2018 1102   GFRAA 72 09/12/2019 1048   GFRAA 73 11/30/2018 1102        ASSESSMENT and THERAPY PLAN:   Metastatic squamous cell carcinoma to bone (HCC) Wheeler is a 46 year old man with h/o 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  Most recent PET scan in 03/2021 shows resolution of FDG uptake.    He has had new hip and tailbone pain, and CT pelvis indeterminate.  PET/CT suggested possibility of metastatic disease.  He recently had total hip arthroplasty on the right side anterior approach as well as deep biopsy of the right proximal femur with suggested poorly differentiated squamous cell carcinoma, questionable pulmonary origin however other sites cannot be excluded.   After his last telephone visit, we have agreed to an in person visit to answer all the questions. Since his last visit, he continues to recover, anticipating some postop radiation, has a follow-up with Dr. Isidore Moos September 6. At this time we have discussed that his pathology shows diffuse and strong staining for HPV consistent with metastatic squamous cell carcinoma of the head and neck.  I do not believe this is a primary lung cancer, no lung lesions have been identified on the PET/CT.  Foundation 1 testing has been sent, PD-L1 will be added to the foundation 1 specimen and all will be done locally.  According to my last discussion with Dr. Saralyn Pilar, he agreed to do PD-L1 here.  I  have engaged our nurse navigator to check on this.  Given young age, metastatic HPV and excellent performance status at baseline, we have discussed about considering frontline clinical trials which is what the patient was also hoping for.  He had a voice message from another oncologist that his friend new which suggested some molecular testing, clinical trials, role of immunotherapy etc. At this time we do not have any frontline clinical trials.  We will try to coordinate with [facility] and [facility] to see if they have any frontline clinical trials.  He is also willing to travel out of state if needed.  We will also try and attempt calling [facility] Sills if they can do a televisit to review any available trials for him.  If he does not have any clinical trials, we will start him on chemoimmunotherapy frontline, can sandwich radiation between 2 cycles of chemotherapy to avoid any delays.  He is understandably very apprehensive about delays in treatment.   #2 cancer related pain, recommended taking Percocet as needed.  He was encouraged to contact us for any pain needs and he expressed understanding.  #3 goals of care, we have discussed that this is metastatic head and neck, the intent of treatment is palliative, median survival can be in the order of couple years with treatment unless he finds a great clinical trial which may significantly prolong life.  He still understands that eventually the cancer will learn to evade the effect of all these drugs and he is performance status may deteriorate enough that he may not be able to tolerate any treatment at which point we may have to focus on goals of care.  All questions were answered. The patient knows to call the clinic with any problems, questions or concerns. We can certainly see the patient much sooner if necessary.  Total encounter time:34minutes* , chart review, lab review, care coordination, order entry, and documentation of the encounter time.  *Total  Encounter Time  as defined by the Centers for Medicare and Medicaid Services includes, in addition to the face-to-face time of a patient visit (documented in the note above) non-face-to-face time: obtaining and reviewing outside history, ordering and reviewing medications, tests or procedures, care coordination (communications with other health care professionals or caregivers) and documentation in the medical record.

## 2022-06-15 NOTE — Assessment & Plan Note (Signed)
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  Most recent PET scan in 03/2021 shows resolution of FDG uptake.    He has had new hip and tailbone pain, and CT pelvis indeterminate.  PET/CT suggested possibility of metastatic disease.  He recently had total hip arthroplasty on the right side anterior approach as well as deep biopsy of the right proximal femur with suggested poorly differentiated squamous cell carcinoma, questionable pulmonary origin however other sites cannot be excluded.   After his last telephone visit, we have agreed to an in person visit to answer all the questions. Since his last visit, he continues to recover, anticipating some postop radiation, has a follow-up with Dr. Isidore Moos September 6. At this time we have discussed that his pathology shows diffuse and strong staining for HPV consistent with metastatic squamous cell carcinoma of the head and neck.  I do not believe this is a primary lung cancer, no lung lesions have been identified on the PET/CT.  Foundation 1 testing has been sent, PD-L1 will be added to the foundation 1 specimen and all will be done locally.  According to my last discussion with Dr. Saralyn Pilar, he agreed to do PD-L1 here.  I have engaged our nurse navigator to check on this.  Given young age, metastatic HPV and excellent performance status at baseline, we have discussed about considering frontline clinical trials which is what the patient was also hoping for.  He had a voice message from another oncologist that his friend new which suggested some molecular testing, clinical trials, role of immunotherapy etc. At this time we do not have any frontline clinical trials.  We will try to coordinate with [facility] and [facility] to see if they have any frontline clinical trials.  He is also willing to travel out of state if needed.  We will also try and attempt calling [facility] S[facility] if  they can do a televisit to review any available trials for him.  If he does not have any clinical trials, we will start him on chemoimmunotherapy frontline, can sandwich radiation between 2 cycles of chemotherapy to avoid any delays.  He is understandably very apprehensive about delays in treatment.   #2 cancer related pain, recommended taking Percocet as needed.  He was encouraged to contact us for any pain needs and he expressed understanding.  #3 goals of care, we have discussed that this is metastatic head and neck, the intent of treatment is palliative, median survival can be in the order of couple years with treatment unless he finds a great clinical trial which may significantly prolong life.  He still understands that eventually the cancer will learn to evade the effect of all these drugs and he is performance status may deteriorate enough that he may not be able to tolerate any treatment at which point we may have to focus on goals of care.

## 2022-06-16 ENCOUNTER — Other Ambulatory Visit: Payer: Self-pay

## 2022-06-16 ENCOUNTER — Encounter: Payer: Self-pay | Admitting: Hematology and Oncology

## 2022-06-16 DIAGNOSIS — C090 Malignant neoplasm of tonsillar fossa: Secondary | ICD-10-CM

## 2022-06-16 DIAGNOSIS — C7951 Secondary malignant neoplasm of bone: Secondary | ICD-10-CM | POA: Diagnosis not present

## 2022-06-17 DIAGNOSIS — C099 Malignant neoplasm of tonsil, unspecified: Secondary | ICD-10-CM | POA: Diagnosis not present

## 2022-06-18 ENCOUNTER — Ambulatory Visit
Admission: RE | Admit: 2022-06-18 | Discharge: 2022-06-18 | Disposition: A | Discharge status: Home / Self Care | Payer: BC Managed Care – PPO | Source: Ambulatory Visit | Attending: Radiation Oncology | Admitting: Radiation Oncology

## 2022-06-18 ENCOUNTER — Encounter: Payer: Self-pay | Admitting: Radiation Oncology

## 2022-06-18 ENCOUNTER — Other Ambulatory Visit: Payer: Self-pay

## 2022-06-18 DIAGNOSIS — C090 Malignant neoplasm of tonsillar fossa: Secondary | ICD-10-CM | POA: Insufficient documentation

## 2022-06-18 DIAGNOSIS — C7951 Secondary malignant neoplasm of bone: Secondary | ICD-10-CM | POA: Diagnosis not present

## 2022-06-18 NOTE — Progress Notes (Signed)
Radiation Oncology         [phone] ________________________________  Name: EDOUARD GIKAS MRN: [id number]  Date: 06/18/2022  DOB: February 19, 1976  Follow-Up Visit Note  Outpatient  CC: Pleas [name], NP  Benay Pike, MD  Diagnosis and Prior Radiotherapy:    ICD-10-CM   1. Cancer of tonsillar fossa (HCC)  C09.0     2. Metastatic squamous cell carcinoma to bone ([place])  C79.51       CHIEF COMPLAINT: Here for follow-up and surveillance of bone cancer  Narrative:  The patient returns today for routine follow-up.     He completed his orthopedic surgery to his right hip on 06/02/2022.  Pathology revealed Metastatic poorly differentiated squamous cell carcinoma             Orthopedics has released him for radiation planning.  The patient is exploring clinical trials for systemic options at [facility] and [facility].  He is not sure if any of these clinical trials contraindicate radiation therapy.  Our mutual plan today is to go ahead and plan his radiation but not started until he understands the criteria for that clinical trials makes a decision in terms of what direction he wants to go with clinical trials and  systemic therapy.  He is healing well from surgery and using a cane to ambulate                 ALLERGIES:  has No Known Allergies.  Meds: Current Outpatient Medications  Medication Sig Dispense Refill   anastrozole (ARIMIDEX) 1 MG tablet Take 1 mg by mouth once a week.     apixaban (ELIQUIS) 2.5 MG TABS tablet Take 1 tablet (2.5 mg total) by mouth 2 (two) times daily. 60 tablet 0   docusate sodium (COLACE) 100 MG capsule Take 1 capsule (100 mg total) by mouth 2 (two) times daily. 60 capsule 1   famotidine (PEPCID) 20 MG tablet TAKE 1 TABLET BY MOUTH ONCE DAILY For heartburn 90 tablet 2   magnesium oxide (MAG-OX) 400 (241.3 Mg) MG tablet Take 1 tablet (400 mg total) by mouth 2 (two) times daily. (Patient taking differently: Take 400 mg by mouth daily.) 20 tablet 0   Omega-3  Fatty Acids (FISH OIL PO) Take 1 capsule by mouth daily.     omeprazole (PRILOSEC) 20 MG capsule Take 20 mg by mouth daily.     ondansetron (ZOFRAN) 4 MG tablet Take 1 tablet (4 mg total) by mouth every 8 (eight) hours as needed for nausea or vomiting. 20 tablet 0   oxyCODONE-acetaminophen (PERCOCET/ROXICET) 5-325 MG tablet Take 1 tablet by mouth every 6 (six) hours as needed for severe pain. 20 tablet 0   oxymetazoline (AFRIN) 0.05 % nasal spray Place 1-2 sprays into both nostrils as needed for congestion.      polyethylene glycol (MIRALAX) 17 g packet Take 17 g by mouth daily as needed for moderate constipation or severe constipation. 14 each 0   senna (SENOKOT) 8.6 MG TABS tablet Take 2 tablets (17.2 mg total) by mouth at bedtime. 60 tablet 1   TURMERIC PO Take 1 capsule by mouth daily.     No current facility-administered medications for this encounter.    Physical Findings: The patient is in no acute distress. Patient is alert and oriented.  vitals were not taken for this visit. .    MSK: ambulating with a cane  Lab Findings: Lab Results  Component Value Date   WBC 8.2 05/21/2022  HGB 15.9 05/21/2022   HCT 46.9 05/21/2022   MCV 94.7 05/21/2022   PLT 248 05/21/2022    Radiographic Findings: DG Pelvis Portable  Result Date: 06/02/2022 CLINICAL DATA:  Provided history: Right total hip arthroplasty, postop image. EXAM: PORTABLE PELVIS 1-2 VIEWS COMPARISON:  Intraoperative fluoroscopic images of the right hip performed earlier today 06/02/2022. FINDINGS: Immediate postoperative changes from right total hip arthroplasty performed earlier today. The femoral and acetabular components appear well seated. Surrounding soft tissue swelling and subcutaneous gas, not unexpected in the immediate postoperative period. IMPRESSION: Immediate postoperative changes from right total hip arthroplasty. Electronically Signed   By: Kellie Simmering D.O.   On: 06/02/2022 14:48   DG HIP PORT UNILAT WITH PELVIS  1V RIGHT  Result Date: 06/02/2022 CLINICAL DATA:  Fluoroscopic assistance for right hip arthroplasty EXAM: DG HIP (WITH OR WITHOUT PELVIS) 1V PORT RIGHT COMPARISON:  05/18/2021 FINDINGS: Fluoroscopic images show right hip arthroplasty. No fracture is seen. Fluoroscopy time 20 seconds, radiation dose 2.5 mGy. IMPRESSION: Fluoroscopic assistance was provided for right hip arthroplasty. Electronically Signed   By: Elmer Picker M.D.   On: 06/02/2022 12:55   DG C-Arm 1-60 Min-No Report  Result Date: 06/02/2022 Fluoroscopy was utilized by the requesting physician.  No radiographic interpretation.   DG C-Arm 1-60 Min-No Report  Result Date: 06/02/2022 Fluoroscopy was utilized by the requesting physician.  No radiographic interpretation.   MR FEMUR RIGHT W WO CONTRAST  Result Date: 05/26/2022 CLINICAL DATA:  Bone neoplasm, femur, recurrence suspected Concern for metastatic SCC of H and N, right femur lesion noted PET scan EXAM: MRI OF THE RIGHT FEMUR WITHOUT AND WITH CONTRAST TECHNIQUE: Multiplanar, multisequence MR imaging of the right femur was performed both before and after administration of intravenous contrast. CONTRAST:  23m GADAVIST GADOBUTROL 1 MMOL/ML IV SOLN COMPARISON:  PET-CT 05/21/2022. FINDINGS: Bones/Joint/Cartilage Within the intertrochanteric right proximal femur, there is a T2 hyperintense/T1 hypointense mass with cortical breakthrough and enhancement. The lesion measures approximately 3.5 x 3.4 x 3.5 cm (series 12 image 25, series 14 image 15) and involves 70% of the intertrochanteric femur width. This has a hypointense rim. There is intense adjacent edema and enhancement in the inter trochanteric and subtrochanteric bone. There is cortical breakthrough anteriorly. There is adjacent periostitis and cortical thinning. This corresponds to the hypermetabolic lesion on recent PET-CT. There is a lesion in the lower sacrum involving the sacral canal, which corresponds to a hypermetabolic  lesion on recent PET-CT, measuring 4.4 x 3.2 cm (series 12, image 2). There is cortical breakthrough anteriorly with adjacent soft tissue mass. On large field-of-view images, there is a partially visualized T1 hypointense/T2 hyperintense lesion with adjacent cortical thinning in the left inferior pubic ramus/ischial tuberosity measuring 2.3 cm (series 5 image 14), also corresponding to a hypermetabolic lesion on recent PET-CT. There is moderate to severe right hip osteoarthritis with cam deformity. Muscles and Tendons There is edema signal and enhancement within the adjacent vastus lateralis origin. No other significant muscle edema or muscle atrophy. No intramuscular collection. Soft tissues No focal fluid collection.  No other soft tissue mass. IMPRESSION: Aggressive 3.5 cm lesion in the intertrochanteric right proximal femur, with cortical breakthrough anteriorly, periostitis, and associated edema and enhancement in the intertrochanteric and subtrochanteric bone. This lesion involves 70% of intertrochanteric femur width, at high risk for pathologic fracture. Additional similar appearing lesions in the sacrum and left inferior pubic ramus/ischial tuberosity as described. Findings are highly concerning for metastatic disease. Electronically Signed   By: JEdison Nasuti  Chancy Milroy M.D.   On: 05/26/2022 07:16   NM PET Image Restag (PS) Skull Base To Thigh  Result Date: 05/21/2022 CLINICAL DATA:  Subsequent treatment strategy for head and neck cancer (left tonsillar squamous cell carcinoma). Chronic right hip pain with previous steroid injections. EXAM: NUCLEAR MEDICINE PET SKULL BASE TO THIGH TECHNIQUE: 10.33 mCi F-18 FDG was injected intravenously. Full-ring PET imaging was performed from the skull base to thigh after the radiotracer. CT data was obtained and used for attenuation correction and anatomic localization. Fasting blood glucose: 93 mg/dl COMPARISON:  PET-CT 04/10/2021. Neck CTA 05/29/2021. Pelvic CT 05/09/2022.  FINDINGS: Mediastinal blood pool activity: SUV max 2.3 NECK: No recurrent hypermetabolic cervical lymph nodes are identified. There are no recurrent lesions of the pharyngeal mucosal space. Incidental CT findings: none CHEST: There are no hypermetabolic mediastinal, hilar or axillary lymph nodes. No hypermetabolic pulmonary activity or suspicious nodularity. Incidental CT findings: Azygous fissure noted. Previously demonstrated hypermetabolic activity within the thoracic esophagus has improved, likely related to esophagitis or reflux. ABDOMEN/PELVIS: There is no hypermetabolic activity within the liver, adrenal glands, spleen or pancreas. There is no hypermetabolic nodal activity. There is a new intensely hypermetabolic soft tissue mass anterior to the lower sacrum, measuring 3.6 x 1.8 cm on image 185/4. This has an SUV max of 10.9 and is associated with sacral bone destruction and osseous hypermetabolic activity. Incidental CT findings: none SKELETON: As above, new hypermetabolic lytic lesion in the distal sacrum with an associated presacral soft tissue mass. In addition, there is a hypermetabolic lytic lesion anteriorly in the intertrochanteric region of the proximal right femur (SUV max 5.9) and a hypermetabolic lytic lesion within the left ischium (SUV max 6.1). There is a possible hypermetabolic lesion within the left anterior arch of C1 (SUV max 5.1). Incidental CT findings: none IMPRESSION: 1. Suspicion of multifocal osseous metastatic disease as described, primarily in the pelvis. There is a lytic sacral lesion associated with a hypermetabolic presacral soft tissue mass which should be amenable to percutaneous biopsy. Lesion in the intertrochanteric region of the right femur may predispose to pathologic fracture. Pelvic MRI may be helpful for further evaluation. 2. No evidence of local recurrence of head and neck cancer or nodal disease in the neck or chest. Electronically Signed   By: Richardean Sale M.D.    On: 05/21/2022 17:21    Impression/Plan: We will proceed with CT planning today for radiation to the right hip and femur.  He understands that radiation can start as soon as next week but it is not urgent.  He will go ahead with consultations for systemic therapy clinical trials and get back in touch with [place] regarding when he would like to start his radiation therapy.  I am doubtful that radiation would be contraindicated in any of these clinical trials but support waiting to start the radiation as it is not urgent.  He knows how to contact us and understands that scheduling for his radiation treatments will be put on hold until we hear back from him..  On date of service, in total, I spent 15 minutes on this encounter. Patient was seen in person.  _____________________________________   Eppie Gibson, MD

## 2022-06-18 NOTE — Progress Notes (Signed)
Oncology Nurse Navigator Documentation   To provide support, encouragement and care continuity, met with [facility] before his CT SIM.  He tolerated procedure without difficulty, denied questions/concerns.   I will contact the head and neck navigator at [facility] to inquire about the ability to get radiation to his bone metastasis while being considered for a clinical trial. He knows that I will call him when I have heard back from them.   He knows to call or email me anytime for any questions or concerns.   Harlow Asa RN, BSN, OCN Head & Neck Oncology Nurse [place] at [facility] Phone # [phone]  Fax # [phone]

## 2022-06-21 ENCOUNTER — Encounter (HOSPITAL_COMMUNITY): Payer: Self-pay

## 2022-06-21 ENCOUNTER — Encounter: Payer: Self-pay | Admitting: Hematology and Oncology

## 2022-06-21 ENCOUNTER — Other Ambulatory Visit: Payer: Self-pay | Admitting: *Deleted

## 2022-06-21 DIAGNOSIS — C7951 Secondary malignant neoplasm of bone: Secondary | ICD-10-CM | POA: Diagnosis not present

## 2022-06-21 DIAGNOSIS — C4492 Squamous cell carcinoma of skin, unspecified: Secondary | ICD-10-CM | POA: Diagnosis not present

## 2022-06-22 ENCOUNTER — Telehealth: Payer: Self-pay | Admitting: *Deleted

## 2022-06-22 ENCOUNTER — Ambulatory Visit: Payer: BC Managed Care – PPO | Admitting: Radiation Oncology

## 2022-06-22 ENCOUNTER — Other Ambulatory Visit: Payer: Self-pay | Admitting: Hematology and Oncology

## 2022-06-22 ENCOUNTER — Encounter (HOSPITAL_COMMUNITY): Payer: Self-pay | Admitting: Hematology and Oncology

## 2022-06-22 ENCOUNTER — Encounter: Payer: Self-pay | Admitting: Hematology and Oncology

## 2022-06-22 MED ORDER — OXYCODONE-ACETAMINOPHEN 5-325 MG PO TABS: 1.0000 | ORAL_TABLET | Freq: Four times a day (QID) | ORAL | 0 refills | Status: DC | PRN

## 2022-06-22 NOTE — Telephone Encounter (Signed)
This RN contacted the pt at approximately 1315 for update on bowel status and pain level.   Justin Baldwin states his pain is better controlled post taking 2 percocets and understands that he needs to use 1 tab q 6 hours presently - with caveat of need to have BM.  He has taken the miralax and used the glycerin suppositories- he also obtained enemas if needed " this happened to me before "  Pt denies and abdominal discomfort or nausea.  No other needs at this time.

## 2022-06-22 NOTE — Telephone Encounter (Signed)
This RN spoke with pt per his my chart message regarding pain with tailbone.  Per inquiry pt states last BM of at least 5 days ago - he does illicit passing of gas. He is not nauseated.  Per above- discussed need for aggressive use of glycerin suppositories and miralax for facilitating a bowel movement today  Percocet also sent in for pain- discussed use.  Of note Justin Baldwin states he understands goal of not radiating the tailbone for "measurable" disease response but also states that he feels presently if radiation relieved his pain- and allowed him to be more functional he is considering proceeding with it.  This RN validated his pain issues - and gave instructions per My Chart on how to proceed with OTC meds for bowel movement.  Informed him too- that pain presently may be exacerbated due to stool in the anal area -and by keeping stools soft and frequent - pain may be alleviated better with better overall function.  Justin Baldwin verbalized understanding of plan.

## 2022-06-23 ENCOUNTER — Ambulatory Visit: Payer: BC Managed Care – PPO | Admitting: Hematology and Oncology

## 2022-06-23 ENCOUNTER — Ambulatory Visit: Payer: BC Managed Care – PPO

## 2022-06-23 ENCOUNTER — Telehealth: Payer: Self-pay | Admitting: *Deleted

## 2022-06-23 NOTE — Telephone Encounter (Signed)
This RN spoke with pt for follow up post initiation yesterday of bowel prophylaxis. Justin Baldwin says he has had BM and "still working on things but feeling much better- tailbone is not bothering me like it was "  No further needs at this time.

## 2022-06-24 ENCOUNTER — Ambulatory Visit: Payer: BC Managed Care – PPO

## 2022-06-25 ENCOUNTER — Ambulatory Visit: Payer: BC Managed Care – PPO

## 2022-06-25 DIAGNOSIS — Z510 Encounter for antineoplastic radiation therapy: Secondary | ICD-10-CM | POA: Insufficient documentation

## 2022-06-25 DIAGNOSIS — C090 Malignant neoplasm of tonsillar fossa: Secondary | ICD-10-CM | POA: Insufficient documentation

## 2022-06-25 DIAGNOSIS — C7951 Secondary malignant neoplasm of bone: Secondary | ICD-10-CM | POA: Diagnosis not present

## 2022-06-29 ENCOUNTER — Ambulatory Visit: Payer: BC Managed Care – PPO

## 2022-06-29 ENCOUNTER — Other Ambulatory Visit: Payer: Self-pay | Admitting: Hematology and Oncology

## 2022-06-29 DIAGNOSIS — Z6827 Body mass index (BMI) 27.0-27.9, adult: Secondary | ICD-10-CM | POA: Diagnosis not present

## 2022-06-29 DIAGNOSIS — C109 Malignant neoplasm of oropharynx, unspecified: Secondary | ICD-10-CM | POA: Diagnosis not present

## 2022-06-29 DIAGNOSIS — M545 Low back pain, unspecified: Secondary | ICD-10-CM | POA: Diagnosis not present

## 2022-06-29 DIAGNOSIS — C099 Malignant neoplasm of tonsil, unspecified: Secondary | ICD-10-CM | POA: Diagnosis not present

## 2022-06-29 DIAGNOSIS — C760 Malignant neoplasm of head, face and neck: Secondary | ICD-10-CM | POA: Diagnosis not present

## 2022-06-29 MED ORDER — OXYCODONE-ACETAMINOPHEN 5-325 MG PO TABS: 1.0000 | ORAL_TABLET | Freq: Four times a day (QID) | ORAL | 0 refills | Status: DC | PRN

## 2022-06-29 NOTE — Progress Notes (Signed)
Oxycodone refilled.

## 2022-06-30 ENCOUNTER — Ambulatory Visit: Payer: BC Managed Care – PPO

## 2022-06-30 ENCOUNTER — Ambulatory Visit: Payer: BC Managed Care – PPO | Admitting: Radiation Oncology

## 2022-06-30 ENCOUNTER — Inpatient Hospital Stay: Payer: BC Managed Care – PPO | Admitting: Nurse Practitioner

## 2022-07-01 ENCOUNTER — Telehealth: Payer: Self-pay | Admitting: *Deleted

## 2022-07-01 ENCOUNTER — Ambulatory Visit: Payer: BC Managed Care – PPO

## 2022-07-01 NOTE — Telephone Encounter (Signed)
Per My Chart message: I also need a stronger laxative       Hey was there any updates on my refil?   I've decided to join the study in [facility], I was told I should contact you and let you know.   Also, [facility] told me to tell the person who gave me the referral to "check the status" of it, I don't know what that really means but that would be dr Chryl Heck    Also, as far as pain management from here forward who do I need to deal with because the tailbone is a daily problem    This RN called pt per above- last BM was 3 days ago- he is using miralax daily with use of glycerin suppositories as well.  Discussed he can increase the Miralax to twice a day - or he could institute use of magnesium citrate (1/2 of bottle).  He denies any abd discomfort or bloating. He is not having nausea.  Informed him pain medication was refilled and also discussed need to take stool softener with each pain med dose with goal to have more loose stools.  This RN also discussed when a patient goes under a study - and dependent on what the study- usually we are the supporters- we can manage his daily needs relating to pain and other issues like the constipation while [facility] focuses on the components relating to the study.  No further needs at this time. Tannor understands to call if needed.

## 2022-07-02 ENCOUNTER — Ambulatory Visit: Payer: BC Managed Care – PPO

## 2022-07-05 ENCOUNTER — Ambulatory Visit: Payer: BC Managed Care – PPO

## 2022-07-05 DIAGNOSIS — C7951 Secondary malignant neoplasm of bone: Secondary | ICD-10-CM | POA: Diagnosis not present

## 2022-07-05 DIAGNOSIS — C090 Malignant neoplasm of tonsillar fossa: Secondary | ICD-10-CM | POA: Diagnosis not present

## 2022-07-05 DIAGNOSIS — C099 Malignant neoplasm of tonsil, unspecified: Secondary | ICD-10-CM | POA: Diagnosis not present

## 2022-07-06 ENCOUNTER — Other Ambulatory Visit: Payer: Self-pay | Admitting: Hematology and Oncology

## 2022-07-06 ENCOUNTER — Ambulatory Visit: Payer: BC Managed Care – PPO

## 2022-07-06 ENCOUNTER — Telehealth: Payer: Self-pay

## 2022-07-06 MED ORDER — MORPHINE SULFATE ER 15 MG PO TBCR: 15.0000 mg | EXTENDED_RELEASE_TABLET | Freq: Two times a day (BID) | ORAL | 0 refills | Status: DC

## 2022-07-06 NOTE — Telephone Encounter (Signed)
Notified Patient of prior authorization approval for Morphine Sulfate ER '15mg'$  Tablets. Medication is approved through 07/05/2023. No other needs or concerns voiced at this time.

## 2022-07-06 NOTE — Progress Notes (Signed)
Patient reports of uncontrolled pain on percocet despite taking 2. We will start him on MS contin 15 mg PO BID Since he will be following up with [facility] for clinical trial and will not be following up with [place] regularly, we recommend that he has his pain addressed at [facility] as well for better coordination of care. We will convey this message to him.  Lydiann Bonifas

## 2022-07-07 ENCOUNTER — Telehealth: Payer: Self-pay | Admitting: *Deleted

## 2022-07-07 NOTE — Telephone Encounter (Signed)
This RN received My Chart message from pt stating his referral for [facility] has not been scheduled d/t missing information.  Note [facility] informed him they have requested from this office but did not get a reply.  This RN called given number per Hilliard Clark for [facility] and was informed they sent an email to the patient stating need for copy of his insurance card.  This RN faxed above to given number ( same information on demographics sent with referral ) with confirmed receipt.

## 2022-07-08 ENCOUNTER — Other Ambulatory Visit: Payer: Self-pay

## 2022-07-08 ENCOUNTER — Inpatient Hospital Stay: Payer: BC Managed Care – PPO | Admitting: Nurse Practitioner

## 2022-07-08 ENCOUNTER — Inpatient Hospital Stay: Payer: BC Managed Care – PPO

## 2022-07-08 ENCOUNTER — Ambulatory Visit
Admission: RE | Admit: 2022-07-08 | Discharge: 2022-07-08 | Disposition: A | Discharge status: Home / Self Care | Payer: BC Managed Care – PPO | Source: Ambulatory Visit | Attending: Radiation Oncology | Admitting: Radiation Oncology

## 2022-07-08 ENCOUNTER — Encounter: Payer: Self-pay | Admitting: Nurse Practitioner

## 2022-07-08 ENCOUNTER — Inpatient Hospital Stay: Payer: BC Managed Care – PPO | Attending: Adult Health | Admitting: Nurse Practitioner

## 2022-07-08 VITALS — BP 153/90 | HR 72 | Resp 16

## 2022-07-08 VITALS — BP 129/65 | HR 73 | Temp 98.8°F | Resp 16 | Ht 72.0 in | Wt 203.1 lb

## 2022-07-08 DIAGNOSIS — Z510 Encounter for antineoplastic radiation therapy: Secondary | ICD-10-CM | POA: Insufficient documentation

## 2022-07-08 DIAGNOSIS — C090 Malignant neoplasm of tonsillar fossa: Secondary | ICD-10-CM | POA: Insufficient documentation

## 2022-07-08 DIAGNOSIS — G893 Neoplasm related pain (acute) (chronic): Secondary | ICD-10-CM | POA: Diagnosis not present

## 2022-07-08 DIAGNOSIS — Z79899 Other long term (current) drug therapy: Secondary | ICD-10-CM | POA: Diagnosis not present

## 2022-07-08 DIAGNOSIS — K5903 Drug induced constipation: Secondary | ICD-10-CM | POA: Diagnosis not present

## 2022-07-08 DIAGNOSIS — Z515 Encounter for palliative care: Secondary | ICD-10-CM | POA: Diagnosis not present

## 2022-07-08 DIAGNOSIS — C7951 Secondary malignant neoplasm of bone: Secondary | ICD-10-CM

## 2022-07-08 DIAGNOSIS — Z5111 Encounter for antineoplastic chemotherapy: Secondary | ICD-10-CM | POA: Diagnosis not present

## 2022-07-08 DIAGNOSIS — F17210 Nicotine dependence, cigarettes, uncomplicated: Secondary | ICD-10-CM | POA: Diagnosis not present

## 2022-07-08 LAB — RAD ONC ARIA SESSION SUMMARY
Course Elapsed Days: 0
Plan Fractions Treated to Date: 1
Plan Prescribed Dose Per Fraction: 4 Gy
Plan Total Fractions Prescribed: 5
Plan Total Prescribed Dose: 20 Gy
Reference Point Dosage Given to Date: 4 Gy
Reference Point Session Dosage Given: 4 Gy
Session Number: 1

## 2022-07-08 MED ORDER — MORPHINE SULFATE (PF) 2 MG/ML IV SOLN
2.0000 mg | INTRAVENOUS | Status: AC | PRN
  Administered 2022-07-08 (×2): 2 mg via INTRAVENOUS
  Filled 2022-07-08 (×2): qty 1

## 2022-07-08 MED ORDER — SODIUM CHLORIDE 0.9 % IV SOLN: Freq: Once | INTRAVENOUS | Status: AC

## 2022-07-08 MED ORDER — KETOROLAC TROMETHAMINE 30 MG/ML IJ SOLN
30.0000 mg | Freq: Once | INTRAMUSCULAR | Status: AC
  Administered 2022-07-08: 30 mg via INTRAVENOUS
  Filled 2022-07-08: qty 1

## 2022-07-08 MED ORDER — XTAMPZA ER 9 MG PO C12A: 9.0000 mg | EXTENDED_RELEASE_CAPSULE | Freq: Two times a day (BID) | ORAL | 0 refills | Status: DC | PRN

## 2022-07-08 NOTE — Progress Notes (Signed)
[place]  Telephone:([phone]) [phone] Fax:(336) [phone]   Name: Justin Baldwin Date: 07/08/2022 MRN: [id number]  DOB: [birth year]  Patient Care Team: Pleas [name], NP as PCP - General (Internal Medicine) Festus Aloe, MD as Consulting Physician (Urology) Melida Quitter, MD as Consulting Physician (Otolaryngology) Karie Mainland, RD as Dietitian (Nutrition) Eppie Gibson, MD as Consulting Physician (Radiation Oncology) Malmfelt, Stephani Police, RN as Oncology Nurse Navigator Benay Pike, MD as Consulting Physician (Hematology and Oncology)    REASON FOR CONSULTATION: Justin Baldwin is a 46 y.o. 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 In the process of establishing care with [facility] for possible clinical trial. Palliative ask to see for symptom management.    SOCIAL HISTORY:     reports that he has been smoking cigarettes. He has been smoking an average of 1 pack per day. He quit smokeless tobacco use about 6 years ago. He reports current alcohol use of about 5.0 standard drinks of alcohol per week. He reports that he does not use drugs.  ADVANCE DIRECTIVES:    CODE STATUS:   PAST MEDICAL HISTORY: Past Medical History:  Diagnosis Date   Arthritis    Diabetes mellitus without complication ([place])    Dizziness 11/30/2018   Erectile dysfunction    GERD (gastroesophageal reflux disease)    Insomnia    Skin abnormality    hidradenitis suppurativa    Throat cancer ([place])     PAST SURGICAL HISTORY:  Past Surgical History:  Procedure Laterality Date   BONE BIOPSY Right 06/02/2022   Procedure: OPEN BIOPSY RIGHT FEMUR;  Surgeon: Rod Can, MD;  Location: [facility];  Service: Orthopedics;  Laterality: Right;   IR GASTROSTOMY TUBE MOD SED  11/21/2020   IR GASTROSTOMY  TUBE REMOVAL  02/27/2021   IR IMAGING GUIDED PORT INSERTION  11/21/2020   IR REMOVAL TUN ACCESS W/ PORT W/O FL MOD SED  06/05/2021   IRRIGATION AND DEBRIDEMENT ABSCESS N/A 09/22/2018   Procedure: IRRIGATION AND DEBRIDEMENT ABSCESS;  Surgeon: Ralene Ok, MD;  Location: [facility];  Service: General;  Laterality: N/A;   TOTAL HIP ARTHROPLASTY Right 06/02/2022   Procedure: TOTAL HIP ARTHROPLASTY ANTERIOR APPROACH;  Surgeon: Rod Can, MD;  Location: [facility];  Service: Orthopedics;  Laterality: Right;    HEMATOLOGY/ONCOLOGY HISTORY:  Oncology History  Cancer of tonsillar fossa ([place])  08/2020 Initial Diagnosis   Justin Baldwin is a 46 y.o. male who presented with ongoing head cold symptoms, sinus issues, left ear pain, and left-sided throat pain since September.  When the cold-like symptoms resolved, the throat and ear pain did not. Augmentin was prescribed without relief.   09/19/2020 Initial Biopsy   Subsequently, the patient saw Dr. Redmond Baseman, ENT at [facility], who noted that the left tonsil had a mass appearance on 09/19/2020. 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   09/30/2020 Imaging   Pertinent imaging thus far includes soft tissue neck CT scan on 09/30/2020 revealed a left tonsillar mass abutting the glossotonsillar sulcus and base of the tongue, consistent with known malignancy. Left level 2/3 cystic or necrotic adenopathy reflected nodal metastasis.   CT scan of chest performed on 09/30/2020 revealing a non-specific left lower lobe pulmonary micronodule but no acute intrathoracic abnormality and no definite intrathoracic metastasis.  I have personally reviewed his  imaging and discussed him with Dr. Redmond Baseman.     10/01/2020 Cancer Staging   Staging form: Pharynx - HPV-Mediated Oropharynx, AJCC 8th Edition - Clinical stage from 10/01/2020: Stage II (cT3, cN2, cM0, p16+) - Signed by Eppie Gibson, MD on 02/11/2021 Stage prefix: Initial diagnosis    10/10/2020 PET scan   He had a PET scan which showed hypermetabolic left tonsillar mass and bilateral lymphadenopathy consistent with metastatic disease.  Hence recommendation was to proceed with concurrent chemoradiation.   10/28/2020 - 12/15/2020 Radiation Therapy   10/28/2020 through 12/15/2020 Site Technique Total Dose (Gy) Dose per Fx (Gy) Completed Fx Beam Energies  Tonsil, Left: HN_Lt_tonsil IMRT 70/70 2 35/35 6X    11/07/2020 - 12/03/2020 Chemotherapy   Completed 2 cycles of cisplatin at 100 mg/m2. (Chemo start delayed secondary to him having COVID19)       04/10/2021 PET scan   1. Interval resolution of previous FDG uptake associated with left tonsillar mass and FDG avid left level 2/3 cervical lymph node. No new sites of disease identified. 2. New long segment of increased uptake localizing to the esophagus. Correlate for any clinical signs or symptoms of esophagitis. 3.  Aortic Atherosclerosis (ICD10-I70.0).       ALLERGIES:  has No Known Allergies.  MEDICATIONS:  Current Outpatient Medications  Medication Sig Dispense Refill   anastrozole (ARIMIDEX) 1 MG tablet Take 1 mg by mouth once a week.     apixaban (ELIQUIS) 2.5 MG TABS tablet Take 1 tablet (2.5 mg total) by mouth 2 (two) times daily. 60 tablet 0   docusate sodium (COLACE) 100 MG capsule Take 1 capsule (100 mg total) by mouth 2 (two) times daily. 60 capsule 1   famotidine (PEPCID) 20 MG tablet TAKE 1 TABLET BY MOUTH ONCE DAILY For heartburn 90 tablet 2   magnesium oxide (MAG-OX) 400 (241.3 Mg) MG tablet Take 1 tablet (400 mg total) by mouth 2 (two) times daily. (Patient taking differently: Take 400 mg by mouth daily.) 20 tablet 0   morphine (MS CONTIN) 15 MG 12 hr tablet Take 1 tablet (15 mg total) by mouth every 12 (twelve) hours. 20 tablet 0   Omega-3 Fatty Acids (FISH OIL PO) Take 1 capsule by mouth daily.     omeprazole (PRILOSEC) 20 MG capsule Take 20 mg by mouth daily.     ondansetron (ZOFRAN) 4 MG tablet Take 1  tablet (4 mg total) by mouth every 8 (eight) hours as needed for nausea or vomiting. 20 tablet 0   oxyCODONE-acetaminophen (PERCOCET/ROXICET) 5-325 MG tablet Take 1 tablet by mouth every 6 (six) hours as needed for severe pain. 60 tablet 0   oxymetazoline (AFRIN) 0.05 % nasal spray Place 1-2 sprays into both nostrils as needed for congestion.      polyethylene glycol (MIRALAX) 17 g packet Take 17 g by mouth daily as needed for moderate constipation or severe constipation. 14 each 0   senna (SENOKOT) 8.6 MG TABS tablet Take 2 tablets (17.2 mg total) by mouth at bedtime. 60 tablet 1   TURMERIC PO Take 1 capsule by mouth daily.     No current facility-administered medications for this visit.    VITAL SIGNS: BP 129/65 (BP Location: Left Arm, Patient Position: Sitting)   Pulse 73   Temp 98.8 F (37.1 C) (Oral)   Resp 16   Ht 6' (1.829 m)   Wt 203 lb 1.6 oz (92.1 kg)   SpO2 98%   BMI 27.55 kg/m  [employer]  07/08/22 1115  Weight: 203 lb 1.6 oz (92.1 kg)    Estimated body mass index is 27.55 kg/m as calculated from the following:   Height as of this encounter: 6' (1.829 m).   Weight as of this encounter: 203 lb 1.6 oz (92.1 kg).  LABS: CBC:    Component Value Date/Time   WBC 8.2 05/21/2022 1017   HGB 15.9 05/21/2022 1017   HGB 13.5 12/11/2020 0816   HCT 46.9 05/21/2022 1017   PLT 248 05/21/2022 1017   PLT 190 12/11/2020 0816   MCV 94.7 05/21/2022 1017   NEUTROABS 6.4 05/21/2022 1017   LYMPHSABS 1.1 05/21/2022 1017   MONOABS 0.7 05/21/2022 1017   EOSABS 0.1 05/21/2022 1017   BASOSABS 0.1 05/21/2022 1017   Comprehensive Metabolic Panel:    Component Value Date/Time   NA 136 05/31/2022 1313   NA 135 09/12/2019 1048   K 3.9 05/31/2022 1313   CL 103 05/31/2022 1313   CO2 23 05/31/2022 1313   BUN 15 05/31/2022 1313   BUN 18 09/12/2019 1048   CREATININE 1.26 (H) 05/31/2022 1313   CREATININE 1.28 (H) 12/11/2020 0816   CREATININE 1.38 (H) 11/30/2018 1102   GLUCOSE 114  (H) 05/31/2022 1313   CALCIUM 9.1 05/31/2022 1313   AST 20 05/29/2021 1937   ALT 23 05/29/2021 1937   ALKPHOS 26 (L) 05/29/2021 1937   BILITOT 0.6 05/29/2021 1937   PROT 5.8 (L) 05/29/2021 1937   ALBUMIN 3.5 05/29/2021 1937    RADIOGRAPHIC STUDIES: No results found.  PERFORMANCE STATUS (ECOG) : 1 - Symptomatic but completely ambulatory  Review of Systems  Musculoskeletal:  Positive for arthralgias.       Right hip pain   Unless otherwise noted, a complete review of systems is negative.  Physical Exam General: appears uncomfortable, pacing  Cardiovascular: regular rate and rhythm Pulmonary: normal breathing pattern  Abdomen: soft, nontender, + bowel sounds Extremities: no edema, no joint deformities Skin: no rashes Neurological:AAO x3, mood appropriate   IMPRESSION: This is my initial visit with Justin Baldwin. He appears uncomfortable. Will sit, stand, and began pacing due to discomfort. Tearful expressing pain intensity increased from time of leaving radiation and coming upstairs. Unable to sit comfortable as we attempted recliner and chair.   I introduced myself, [place] RN, and Palliative's role in collaboration with the oncology team. Concept of Palliative Care was introduced as specialized medical care for people and their families living with serious illness.  It focuses on providing relief from the symptoms and stress of a serious illness.  The goal is to improve quality of life for both the patient and the family. Values and goals of care important to patient and family were attempted to be elicited.   Justin Baldwin lives alone. He unfortunately is going through a divorce. 2 daughters. Works as a Estate agent.   Able to perform all ADLs independently. Currently still working. Appetite is good.   Neoplasm related pain  Justin Baldwin complains of ongoing right hip and leg pain. Reports pain is constant. Some days better than others. We discussed at length current regimen: percocet 1-2  tablets every 6 hours as needed for pain, MS Contin '15mg'$  every 12 hours. He reports nausea, feeling "feverish", and he does not like taking it because it makes him feel worst.   We discussed continued use of Percocet as prescribed. Will discontinue MS Contin and try Xtampza (if insurance will approve). He knows to contact office if no improvement. We will plan  for close follow-up.   Due to severe pain and distress while in office. We will administer IV fluids, toradol, and IV morphine.   Constipation  Justin Baldwin is having some constipation with no bowel movement in several days. He has been taking daily miralax with no relief. Has taken milk of magnesia, suppository, and enema to facilitate a bowel movement. Education provided on bowel regimen in setting of opioid use.   He will begin taking miralax twice daily along with senna-s 2 tabs twice daily.   PLAN: Established therapeutic relationship. Education provided on palliative's role in collaboration with their Oncology/Radiation team. Discontinue MS Contin Xtampza 9 mg every 12 hours Oxycodone/APAP 5/325 1 to 2 tablets every 6 hours as needed for breakthrough pain Senna S 2 tablets twice daily MiraLAX twice daily Patient received 30 mg Toradol, 4 mg morphine, 500 mL IV fluids during office visit We will continue to closely monitor and support as needed I will plan to see patient back in 2-4 weeks in collaboration to other oncology appointments.    Patient expressed understanding and was in agreement with this plan. He also understands that He can call the clinic at any time with any questions, concerns, or complaints.   Thank you for your referral and allowing Palliative to assist in Justin Baldwin's care.   Number and complexity of problems addressed: HIGH - 1 or more chronic illnesses with SEVERE exacerbation, progression, or side effects of treatment - advanced cancer, pain. Any controlled substances utilized were prescribed in the context  of palliative care.  Time Total: 65 min   Visit consisted of counseling and education dealing with the complex and emotionally intense issues of symptom management and palliative care in the setting of serious and potentially life-threatening illness.Greater than 50%  of this time was spent counseling and coordinating care related to the above assessment and plan.  Signed by: Alda Lea, AGPCNP-BC Palliative Medicine Team/[place]

## 2022-07-08 NOTE — Progress Notes (Signed)
Pt presented with increased pain after radiation appointment. Pt given IVF, '4mg'$  morphine total and '30mg'$  Toradol. Pt reports pain is improved and pt feels well enough to drive. IV removed intact, pt ambulatory to lobby for d/c. Pt agreeable to call with any questions or concerns.

## 2022-07-08 NOTE — Patient Instructions (Signed)
-   stop taking MS Contin (morphine) - pick up and take the oxycodone (Xtampza ) every 12 hours regardless of pain level - continue to take 2 percocet every 6 hours as needed for pain.

## 2022-07-08 NOTE — Patient Instructions (Signed)
Thank you for allowing [place] to assist in your care today. We discussed the following:  Discontinue use of Morphine (MS Contin) based on reported symptoms A new prescription has been called in to your designated pharmacy for oxycodone [employer] [employer]).  You will take this medication every 12 hours. Continue with Percocet 1-2 tablets every 6 hours as needed for breakthrough pain. Zofran as needed for nausea. Continue with MiraLAX twice a day. Please begin taking senna 2 tablets daily.  If no bowel movement within 24 hours increase senna to twice a day. We will plan to follow-up on next week to closely monitor your pain.  Please do not hesitate to give our office a call with any future questions or needs at [phone].

## 2022-07-09 ENCOUNTER — Ambulatory Visit
Admission: RE | Admit: 2022-07-09 | Discharge: 2022-07-09 | Disposition: A | Discharge status: Home / Self Care | Payer: BC Managed Care – PPO | Source: Ambulatory Visit | Attending: Radiation Oncology | Admitting: Radiation Oncology

## 2022-07-09 ENCOUNTER — Other Ambulatory Visit: Payer: Self-pay

## 2022-07-09 DIAGNOSIS — C090 Malignant neoplasm of tonsillar fossa: Secondary | ICD-10-CM | POA: Diagnosis not present

## 2022-07-09 DIAGNOSIS — M899 Disorder of bone, unspecified: Secondary | ICD-10-CM

## 2022-07-09 DIAGNOSIS — C7951 Secondary malignant neoplasm of bone: Secondary | ICD-10-CM

## 2022-07-09 DIAGNOSIS — Z510 Encounter for antineoplastic radiation therapy: Secondary | ICD-10-CM | POA: Diagnosis not present

## 2022-07-09 DIAGNOSIS — M898X5 Other specified disorders of bone, thigh: Secondary | ICD-10-CM

## 2022-07-09 DIAGNOSIS — Z515 Encounter for palliative care: Secondary | ICD-10-CM

## 2022-07-09 DIAGNOSIS — G893 Neoplasm related pain (acute) (chronic): Secondary | ICD-10-CM

## 2022-07-09 LAB — RAD ONC ARIA SESSION SUMMARY
Course Elapsed Days: 1
Plan Fractions Treated to Date: 2
Plan Prescribed Dose Per Fraction: 4 Gy
Plan Total Fractions Prescribed: 5
Plan Total Prescribed Dose: 20 Gy
Reference Point Dosage Given to Date: 8 Gy
Reference Point Session Dosage Given: 4 Gy
Session Number: 2

## 2022-07-09 MED ORDER — OXYCODONE-ACETAMINOPHEN 5-325 MG PO TABS: 1.0000 | ORAL_TABLET | Freq: Four times a day (QID) | ORAL | 0 refills | Status: DC | PRN

## 2022-07-12 ENCOUNTER — Other Ambulatory Visit: Payer: Self-pay

## 2022-07-12 ENCOUNTER — Ambulatory Visit
Admission: RE | Admit: 2022-07-12 | Discharge: 2022-07-12 | Disposition: A | Discharge status: Home / Self Care | Payer: BC Managed Care – PPO | Source: Ambulatory Visit | Attending: Radiation Oncology | Admitting: Radiation Oncology

## 2022-07-12 ENCOUNTER — Ambulatory Visit: Payer: BC Managed Care – PPO

## 2022-07-12 DIAGNOSIS — Z510 Encounter for antineoplastic radiation therapy: Secondary | ICD-10-CM | POA: Diagnosis not present

## 2022-07-12 DIAGNOSIS — C7951 Secondary malignant neoplasm of bone: Secondary | ICD-10-CM | POA: Diagnosis not present

## 2022-07-12 DIAGNOSIS — C090 Malignant neoplasm of tonsillar fossa: Secondary | ICD-10-CM | POA: Diagnosis not present

## 2022-07-12 LAB — RAD ONC ARIA SESSION SUMMARY
Course Elapsed Days: 4
Plan Fractions Treated to Date: 3
Plan Prescribed Dose Per Fraction: 4 Gy
Plan Total Fractions Prescribed: 5
Plan Total Prescribed Dose: 20 Gy
Reference Point Dosage Given to Date: 12 Gy
Reference Point Session Dosage Given: 4 Gy
Session Number: 3

## 2022-07-13 ENCOUNTER — Ambulatory Visit
Admission: RE | Admit: 2022-07-13 | Discharge: 2022-07-13 | Disposition: A | Discharge status: Home / Self Care | Payer: BC Managed Care – PPO | Source: Ambulatory Visit | Attending: Radiation Oncology | Admitting: Radiation Oncology

## 2022-07-13 ENCOUNTER — Other Ambulatory Visit: Payer: Self-pay

## 2022-07-13 DIAGNOSIS — Z471 Aftercare following joint replacement surgery: Secondary | ICD-10-CM | POA: Diagnosis not present

## 2022-07-13 DIAGNOSIS — Z510 Encounter for antineoplastic radiation therapy: Secondary | ICD-10-CM | POA: Diagnosis not present

## 2022-07-13 DIAGNOSIS — Z96641 Presence of right artificial hip joint: Secondary | ICD-10-CM | POA: Diagnosis not present

## 2022-07-13 DIAGNOSIS — C7951 Secondary malignant neoplasm of bone: Secondary | ICD-10-CM | POA: Diagnosis not present

## 2022-07-13 DIAGNOSIS — C090 Malignant neoplasm of tonsillar fossa: Secondary | ICD-10-CM | POA: Diagnosis not present

## 2022-07-13 LAB — RAD ONC ARIA SESSION SUMMARY
Course Elapsed Days: 5
Plan Fractions Treated to Date: 4
Plan Prescribed Dose Per Fraction: 4 Gy
Plan Total Fractions Prescribed: 5
Plan Total Prescribed Dose: 20 Gy
Reference Point Dosage Given to Date: 16 Gy
Reference Point Session Dosage Given: 4 Gy
Session Number: 4

## 2022-07-14 ENCOUNTER — Other Ambulatory Visit: Payer: Self-pay

## 2022-07-14 ENCOUNTER — Ambulatory Visit
Admission: RE | Admit: 2022-07-14 | Discharge: 2022-07-14 | Disposition: A | Discharge status: Home / Self Care | Payer: BC Managed Care – PPO | Source: Ambulatory Visit | Attending: Radiation Oncology | Admitting: Radiation Oncology

## 2022-07-14 ENCOUNTER — Encounter: Payer: Self-pay | Admitting: Radiation Oncology

## 2022-07-14 DIAGNOSIS — C7951 Secondary malignant neoplasm of bone: Secondary | ICD-10-CM | POA: Diagnosis not present

## 2022-07-14 DIAGNOSIS — C090 Malignant neoplasm of tonsillar fossa: Secondary | ICD-10-CM | POA: Diagnosis not present

## 2022-07-14 DIAGNOSIS — Z510 Encounter for antineoplastic radiation therapy: Secondary | ICD-10-CM | POA: Diagnosis not present

## 2022-07-14 LAB — RAD ONC ARIA SESSION SUMMARY
Course Elapsed Days: 6
Plan Fractions Treated to Date: 5
Plan Prescribed Dose Per Fraction: 4 Gy
Plan Total Fractions Prescribed: 5
Plan Total Prescribed Dose: 20 Gy
Reference Point Dosage Given to Date: 20 Gy
Reference Point Session Dosage Given: 4 Gy
Session Number: 5

## 2022-07-14 NOTE — Progress Notes (Signed)
Oncology Nurse Navigator Documentation   Mr. Boze completed his palliative radiation to his hip today. He will now follow with [facility] to proceed with treatment there per their research protocol. He knows to call me if he needs anything.   Harlow Asa RN, BSN, OCN Head & Neck Oncology Nurse [place] at [facility] Phone # [phone]  Fax # [phone]

## 2022-07-15 ENCOUNTER — Ambulatory Visit: Payer: BC Managed Care – PPO

## 2022-07-16 ENCOUNTER — Ambulatory Visit: Payer: BC Managed Care – PPO

## 2022-07-17 ENCOUNTER — Inpatient Hospital Stay: Payer: BC Managed Care – PPO

## 2022-07-19 ENCOUNTER — Inpatient Hospital Stay (HOSPITAL_BASED_OUTPATIENT_CLINIC_OR_DEPARTMENT_OTHER): Payer: BC Managed Care – PPO | Admitting: Nurse Practitioner

## 2022-07-19 ENCOUNTER — Ambulatory Visit: Payer: BC Managed Care – PPO

## 2022-07-19 ENCOUNTER — Other Ambulatory Visit: Payer: Self-pay

## 2022-07-19 ENCOUNTER — Encounter: Payer: Self-pay | Admitting: Nurse Practitioner

## 2022-07-19 VITALS — BP 150/90 | HR 77 | Temp 98.8°F | Resp 15 | Wt 205.1 lb

## 2022-07-19 DIAGNOSIS — C7951 Secondary malignant neoplasm of bone: Secondary | ICD-10-CM

## 2022-07-19 DIAGNOSIS — R5383 Other fatigue: Secondary | ICD-10-CM

## 2022-07-19 DIAGNOSIS — Z5111 Encounter for antineoplastic chemotherapy: Secondary | ICD-10-CM | POA: Diagnosis not present

## 2022-07-19 DIAGNOSIS — C090 Malignant neoplasm of tonsillar fossa: Secondary | ICD-10-CM | POA: Diagnosis not present

## 2022-07-19 DIAGNOSIS — F17210 Nicotine dependence, cigarettes, uncomplicated: Secondary | ICD-10-CM | POA: Diagnosis not present

## 2022-07-19 DIAGNOSIS — G893 Neoplasm related pain (acute) (chronic): Secondary | ICD-10-CM

## 2022-07-19 DIAGNOSIS — Z515 Encounter for palliative care: Secondary | ICD-10-CM | POA: Diagnosis not present

## 2022-07-19 DIAGNOSIS — Z79899 Other long term (current) drug therapy: Secondary | ICD-10-CM | POA: Diagnosis not present

## 2022-07-19 DIAGNOSIS — Z510 Encounter for antineoplastic radiation therapy: Secondary | ICD-10-CM | POA: Diagnosis not present

## 2022-07-19 MED ORDER — OXYCODONE-ACETAMINOPHEN 7.5-325 MG PO TABS: 1.0000 | ORAL_TABLET | Freq: Four times a day (QID) | ORAL | 0 refills | Status: DC | PRN

## 2022-07-19 MED ORDER — XTAMPZA ER 13.5 MG PO C12A: 13.5000 mg | EXTENDED_RELEASE_CAPSULE | Freq: Two times a day (BID) | ORAL | 0 refills | Status: DC

## 2022-07-19 MED ORDER — DEXAMETHASONE 4 MG PO TABS: 4.0000 mg | ORAL_TABLET | Freq: Every day | ORAL | 0 refills | Status: DC

## 2022-07-19 NOTE — Progress Notes (Signed)
[place]  [phone]) [phone] Fax:(336) [phone]   Name: Justin Baldwin Date: 07/19/2022 MRN: [id number]  DOB: 1975/11/04  Patient Care Team: Pleas [name], NP as PCP - General (Internal Medicine) Festus Aloe, MD as Consulting Physician (Urology) Melida Quitter, MD as Consulting Physician (Otolaryngology) Karie Mainland, RD as Dietitian (Nutrition) Eppie Gibson, MD as Consulting Physician (Radiation Oncology) Malmfelt, Stephani Police, RN as Oncology Nurse Navigator Benay Pike, MD as Consulting Physician (Hematology and Oncology)    INTERVAL HISTORY: Justin Baldwin is a 46 y.o. 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 In the process of establishing care with [facility] for possible clinical trial. Palliative ask to see for symptom management.   SOCIAL HISTORY:     reports that he has been smoking cigarettes. He has been smoking an average of 1 pack per day. He quit smokeless tobacco use about 6 years ago. He reports current alcohol use of about 5.0 standard drinks of alcohol per week. He reports that he does not use drugs.  ADVANCE DIRECTIVES:    CODE STATUS:   PAST MEDICAL HISTORY: Past Medical History:  Diagnosis Date   Arthritis    Diabetes mellitus without complication ([place])    Dizziness 11/30/2018   Erectile dysfunction    GERD (gastroesophageal reflux disease)    Insomnia    Skin abnormality    hidradenitis suppurativa    Throat cancer ([facility])     ALLERGIES:  has No Known Allergies.  MEDICATIONS:  Current Outpatient Medications  Medication Sig Dispense Refill   dexamethasone (DECADRON) 4 MG tablet Take 1 tablet (4 mg total) by mouth daily. 5 tablet 0   oxyCODONE ER (XTAMPZA ER) 13.5 MG C12A Take 13.5 mg by mouth in the morning and at bedtime. 60 capsule 0    oxyCODONE-acetaminophen (PERCOCET) 7.5-325 MG tablet Take 1-2 tablets by mouth every 6 (six) hours as needed for severe pain. 90 tablet 0   anastrozole (ARIMIDEX) 1 MG tablet Take 1 mg by mouth once a week.     apixaban (ELIQUIS) 2.5 MG TABS tablet Take 1 tablet (2.5 mg total) by mouth 2 (two) times daily. 60 tablet 0   docusate sodium (COLACE) 100 MG capsule Take 1 capsule (100 mg total) by mouth 2 (two) times daily. 60 capsule 1   famotidine (PEPCID) 20 MG tablet TAKE 1 TABLET BY MOUTH ONCE DAILY For heartburn 90 tablet 2   magnesium oxide (MAG-OX) 400 (241.3 Mg) MG tablet Take 1 tablet (400 mg total) by mouth 2 (two) times daily. (Patient taking differently: Take 400 mg by mouth daily.) 20 tablet 0   Omega-3 Fatty Acids (FISH OIL PO) Take 1 capsule by mouth daily.     omeprazole (PRILOSEC) 20 MG capsule Take 20 mg by mouth daily.     ondansetron (ZOFRAN) 4 MG tablet Take 1 tablet (4 mg total) by mouth every 8 (eight) hours as needed for nausea or vomiting. 20 tablet 0   oxymetazoline (AFRIN) 0.05 % nasal spray Place 1-2 sprays into both nostrils as needed for congestion.      polyethylene glycol (MIRALAX) 17 g packet Take 17 g by mouth daily as needed for moderate constipation or severe constipation. 14 each 0   senna (SENOKOT) 8.6 MG TABS tablet Take 2 tablets (17.2 mg total) by mouth at bedtime. 60 tablet 1   TURMERIC PO  Take 1 capsule by mouth daily.     No current facility-administered medications for this visit.    VITAL SIGNS: BP (!) 150/90 (BP Location: Right Arm, Patient Position: Sitting) Comment: Nurse notified  Pulse 77   Temp 98.8 F (37.1 C) (Oral)   Resp 15   Wt 205 lb 1.6 oz (93 kg)   SpO2 98%   BMI 27.82 kg/m  [employer]   07/19/22 0947  Weight: 205 lb 1.6 oz (93 kg)    Estimated body mass index is 27.82 kg/m as calculated from the following:   Height as of 07/08/22: 6' (1.829 m).   Weight as of this encounter: 205 lb 1.6 oz (93 kg).   PERFORMANCE STATUS  (ECOG) : 1 - Symptomatic but completely ambulatory   Physical Exam General: NAD Cardiovascular: regular rate and rhythm Pulmonary: clear ant fields Extremities: no edema, no joint deformities Skin: no rashes Neurological: AAO x3, mood appropriate   IMPRESSION:  Mr. Stogdill presented to the clinic today for symptom management follow-up. Complains of ongoing right hip pain that radiates down right leg. Some days are better than others. He went for a walk yesterday and contributes his increased discomfort. Feels he possibly over did things. Encouraged him to listen to his body in the future and try not to overdue it allowing for set-backs.   We discussed at length his current regimen. He is tolerating oxycodone 5/'325mg'$  1-2 tabs every 6 hours and Xtampza '9mg'$  twice daily.   Per patient his pain is not well controlled. Unable to sleep over the past several nights due to pain. Discussed goals is to hopefully gain better pain control to allow for improvement in quality of life but with understanding pain will not completely resolve due to extensiveness of his bone involvement.   He is scheduled to be seen at [facility] for clinical trial on next week.   We will increase Xtampza to '13mg'$  twice daily and Oxycodone 7.'5mg'$  every 6 hours. Continue to closely monitor. He understands to continue with bowel regimen to prevent constipation.   ADDENDUM: upon prescribing new medications, reviewing PDMP, medication reconcile, and speaking with pharmacy staff at [facility]. [name] has received additional opioid prescriptions from Dr. Swinteck[facility] on 9/18 (Oxy IR 10 mg and MS Contin 15 mg). I spoke with patient and shared my concerns and legalities with receiving multiple controlled substances from different providers. At this time I am unable to continue with additional prescriptions due to safety concerns and polypharmacy. Instructed patient to discuss further with Dr. Lyla Glassing regarding additional prescription  needs. He verbalized understanding.   PLAN: Patient will need to speak with Orthopedic provider for further pain management support and refill needs.    Patient expressed understanding and was in agreement with this plan. He also understands that He can call the clinic at any time with any questions, concerns, or complaints.   Any controlled substances utilized were prescribed in the context of palliative care. PDMP has been reviewed.   Time Total: 45 min   Visit consisted of counseling and education dealing with the complex and emotionally intense issues of symptom management and palliative care in the setting of serious and potentially life-threatening illness.Greater than 50%  of this time was spent counseling and coordinating care related to the above assessment and plan.  Alda Lea, AGPCNP-BC  Palliative Medicine Team/[place]

## 2022-07-20 ENCOUNTER — Ambulatory Visit: Payer: BC Managed Care – PPO

## 2022-07-20 DIAGNOSIS — Z6827 Body mass index (BMI) 27.0-27.9, adult: Secondary | ICD-10-CM | POA: Diagnosis not present

## 2022-07-20 DIAGNOSIS — C760 Malignant neoplasm of head, face and neck: Secondary | ICD-10-CM | POA: Diagnosis not present

## 2022-07-20 DIAGNOSIS — C109 Malignant neoplasm of oropharynx, unspecified: Secondary | ICD-10-CM | POA: Diagnosis not present

## 2022-07-20 DIAGNOSIS — Z006 Encounter for examination for normal comparison and control in clinical research program: Secondary | ICD-10-CM | POA: Diagnosis not present

## 2022-07-21 ENCOUNTER — Ambulatory Visit: Payer: BC Managed Care – PPO

## 2022-07-21 DIAGNOSIS — C109 Malignant neoplasm of oropharynx, unspecified: Secondary | ICD-10-CM | POA: Diagnosis not present

## 2022-07-21 DIAGNOSIS — C760 Malignant neoplasm of head, face and neck: Secondary | ICD-10-CM | POA: Diagnosis not present

## 2022-07-22 DIAGNOSIS — G893 Neoplasm related pain (acute) (chronic): Secondary | ICD-10-CM | POA: Diagnosis not present

## 2022-07-22 DIAGNOSIS — Z515 Encounter for palliative care: Secondary | ICD-10-CM | POA: Diagnosis not present

## 2022-07-22 DIAGNOSIS — Z79899 Other long term (current) drug therapy: Secondary | ICD-10-CM | POA: Diagnosis not present

## 2022-07-22 DIAGNOSIS — C099 Malignant neoplasm of tonsil, unspecified: Secondary | ICD-10-CM | POA: Diagnosis not present

## 2022-07-22 DIAGNOSIS — R112 Nausea with vomiting, unspecified: Secondary | ICD-10-CM | POA: Diagnosis not present

## 2022-07-22 DIAGNOSIS — R5381 Other malaise: Secondary | ICD-10-CM | POA: Diagnosis not present

## 2022-07-22 DIAGNOSIS — C7951 Secondary malignant neoplasm of bone: Secondary | ICD-10-CM | POA: Diagnosis not present

## 2022-07-22 DIAGNOSIS — R530 Neoplastic (malignant) related fatigue: Secondary | ICD-10-CM | POA: Diagnosis not present

## 2022-07-22 DIAGNOSIS — K5903 Drug induced constipation: Secondary | ICD-10-CM | POA: Diagnosis not present

## 2022-07-22 DIAGNOSIS — G4700 Insomnia, unspecified: Secondary | ICD-10-CM | POA: Diagnosis not present

## 2022-07-26 NOTE — Progress Notes (Signed)
                                                                                                                                                             Patient Name: Justin Baldwin MRN: [id number] DOB: July 20, 1976 Referring Physician: Benay Pike Date of Service: 07/14/2022 [facility],                                                         End Of Treatment Note  Diagnoses: C79.51-Secondary malignant neoplasm of bone  Cancer Staging:  Cancer Staging  Cancer of tonsillar fossa ([place]) Staging form: Pharynx - HPV-Mediated Oropharynx, AJCC 8th Edition - Clinical stage from 10/01/2020: Stage II (cT3, cN2, cM0, p16+) - Signed by Eppie Gibson, MD on 02/11/2021 Stage prefix: Initial diagnosis  Now with STAGE IV disease  Intent: Palliative  Radiation Treatment Dates: 07/08/2022 through 07/14/2022 Site Technique Total Dose (Gy) Dose per Fx (Gy) Completed Fx Beam Energies  Hip, Right: Pelvis_R_hip Complex 20/20 4 5/5 15X   Narrative: The patient tolerated radiation therapy relatively well. He did have significant right leg /hip pain after the first treatment which responded to IV fluids and medication (Toradol). He was treated with 5 fractions and AP/PA fields. 5 fraction plan was used rather than a 30Gy/10 fraction plan to expedite treatment on a clinical trial at [facility].  Plan: The patient will follow-up with radiation oncology in 37mo. -----------------------------------  SEppie Gibson MD

## 2022-07-27 DIAGNOSIS — C7989 Secondary malignant neoplasm of other specified sites: Secondary | ICD-10-CM | POA: Diagnosis not present

## 2022-07-27 DIAGNOSIS — Z006 Encounter for examination for normal comparison and control in clinical research program: Secondary | ICD-10-CM | POA: Diagnosis not present

## 2022-07-27 DIAGNOSIS — C109 Malignant neoplasm of oropharynx, unspecified: Secondary | ICD-10-CM | POA: Diagnosis not present

## 2022-07-27 DIAGNOSIS — R222 Localized swelling, mass and lump, trunk: Secondary | ICD-10-CM | POA: Diagnosis not present

## 2022-07-29 DIAGNOSIS — C7951 Secondary malignant neoplasm of bone: Secondary | ICD-10-CM | POA: Diagnosis not present

## 2022-07-29 DIAGNOSIS — C099 Malignant neoplasm of tonsil, unspecified: Secondary | ICD-10-CM | POA: Diagnosis not present

## 2022-07-29 DIAGNOSIS — Z510 Encounter for antineoplastic radiation therapy: Secondary | ICD-10-CM | POA: Diagnosis not present

## 2022-07-29 DIAGNOSIS — Z006 Encounter for examination for normal comparison and control in clinical research program: Secondary | ICD-10-CM | POA: Diagnosis not present

## 2022-07-29 DIAGNOSIS — C090 Malignant neoplasm of tonsillar fossa: Secondary | ICD-10-CM | POA: Diagnosis not present

## 2022-07-29 DIAGNOSIS — M533 Sacrococcygeal disorders, not elsewhere classified: Secondary | ICD-10-CM | POA: Diagnosis not present

## 2022-07-29 DIAGNOSIS — C109 Malignant neoplasm of oropharynx, unspecified: Secondary | ICD-10-CM | POA: Diagnosis not present

## 2022-07-29 DIAGNOSIS — R339 Retention of urine, unspecified: Secondary | ICD-10-CM | POA: Diagnosis not present

## 2022-07-29 DIAGNOSIS — Z9221 Personal history of antineoplastic chemotherapy: Secondary | ICD-10-CM | POA: Diagnosis not present

## 2022-08-03 DIAGNOSIS — C7951 Secondary malignant neoplasm of bone: Secondary | ICD-10-CM | POA: Diagnosis not present

## 2022-08-03 DIAGNOSIS — R339 Retention of urine, unspecified: Secondary | ICD-10-CM | POA: Diagnosis not present

## 2022-08-03 DIAGNOSIS — M533 Sacrococcygeal disorders, not elsewhere classified: Secondary | ICD-10-CM | POA: Diagnosis not present

## 2022-08-03 DIAGNOSIS — Z006 Encounter for examination for normal comparison and control in clinical research program: Secondary | ICD-10-CM | POA: Diagnosis not present

## 2022-08-03 DIAGNOSIS — C090 Malignant neoplasm of tonsillar fossa: Secondary | ICD-10-CM | POA: Diagnosis not present

## 2022-08-03 DIAGNOSIS — C109 Malignant neoplasm of oropharynx, unspecified: Secondary | ICD-10-CM | POA: Diagnosis not present

## 2022-08-03 DIAGNOSIS — Z9221 Personal history of antineoplastic chemotherapy: Secondary | ICD-10-CM | POA: Diagnosis not present

## 2022-08-03 DIAGNOSIS — Z510 Encounter for antineoplastic radiation therapy: Secondary | ICD-10-CM | POA: Diagnosis not present

## 2022-08-03 DIAGNOSIS — C099 Malignant neoplasm of tonsil, unspecified: Secondary | ICD-10-CM | POA: Diagnosis not present

## 2022-08-04 DIAGNOSIS — Z8589 Personal history of malignant neoplasm of other organs and systems: Secondary | ICD-10-CM | POA: Diagnosis not present

## 2022-08-04 DIAGNOSIS — M533 Sacrococcygeal disorders, not elsewhere classified: Secondary | ICD-10-CM | POA: Diagnosis not present

## 2022-08-04 DIAGNOSIS — Z515 Encounter for palliative care: Secondary | ICD-10-CM | POA: Diagnosis not present

## 2022-08-04 DIAGNOSIS — G893 Neoplasm related pain (acute) (chronic): Secondary | ICD-10-CM | POA: Diagnosis not present

## 2022-08-04 DIAGNOSIS — C760 Malignant neoplasm of head, face and neck: Secondary | ICD-10-CM | POA: Diagnosis not present

## 2022-08-05 DIAGNOSIS — C099 Malignant neoplasm of tonsil, unspecified: Secondary | ICD-10-CM | POA: Diagnosis not present

## 2022-08-05 DIAGNOSIS — R339 Retention of urine, unspecified: Secondary | ICD-10-CM | POA: Diagnosis not present

## 2022-08-05 DIAGNOSIS — C109 Malignant neoplasm of oropharynx, unspecified: Secondary | ICD-10-CM | POA: Diagnosis not present

## 2022-08-05 DIAGNOSIS — C7951 Secondary malignant neoplasm of bone: Secondary | ICD-10-CM | POA: Diagnosis not present

## 2022-08-05 DIAGNOSIS — C090 Malignant neoplasm of tonsillar fossa: Secondary | ICD-10-CM | POA: Diagnosis not present

## 2022-08-05 DIAGNOSIS — Z9221 Personal history of antineoplastic chemotherapy: Secondary | ICD-10-CM | POA: Diagnosis not present

## 2022-08-05 DIAGNOSIS — M533 Sacrococcygeal disorders, not elsewhere classified: Secondary | ICD-10-CM | POA: Diagnosis not present

## 2022-08-05 DIAGNOSIS — Z510 Encounter for antineoplastic radiation therapy: Secondary | ICD-10-CM | POA: Diagnosis not present

## 2022-08-05 DIAGNOSIS — Z006 Encounter for examination for normal comparison and control in clinical research program: Secondary | ICD-10-CM | POA: Diagnosis not present

## 2022-08-07 DIAGNOSIS — M25512 Pain in left shoulder: Secondary | ICD-10-CM | POA: Diagnosis not present

## 2022-08-07 DIAGNOSIS — W010XXA Fall on same level from slipping, tripping and stumbling without subsequent striking against object, initial encounter: Secondary | ICD-10-CM | POA: Diagnosis not present

## 2022-08-09 DIAGNOSIS — M533 Sacrococcygeal disorders, not elsewhere classified: Secondary | ICD-10-CM | POA: Diagnosis not present

## 2022-08-09 DIAGNOSIS — C099 Malignant neoplasm of tonsil, unspecified: Secondary | ICD-10-CM | POA: Diagnosis not present

## 2022-08-09 DIAGNOSIS — Z9221 Personal history of antineoplastic chemotherapy: Secondary | ICD-10-CM | POA: Diagnosis not present

## 2022-08-09 DIAGNOSIS — R339 Retention of urine, unspecified: Secondary | ICD-10-CM | POA: Diagnosis not present

## 2022-08-09 DIAGNOSIS — C7951 Secondary malignant neoplasm of bone: Secondary | ICD-10-CM | POA: Diagnosis not present

## 2022-08-09 DIAGNOSIS — Z510 Encounter for antineoplastic radiation therapy: Secondary | ICD-10-CM | POA: Diagnosis not present

## 2022-08-09 DIAGNOSIS — C109 Malignant neoplasm of oropharynx, unspecified: Secondary | ICD-10-CM | POA: Diagnosis not present

## 2022-08-09 DIAGNOSIS — Z006 Encounter for examination for normal comparison and control in clinical research program: Secondary | ICD-10-CM | POA: Diagnosis not present

## 2022-08-11 DIAGNOSIS — C099 Malignant neoplasm of tonsil, unspecified: Secondary | ICD-10-CM | POA: Diagnosis not present

## 2022-08-11 DIAGNOSIS — Z6827 Body mass index (BMI) 27.0-27.9, adult: Secondary | ICD-10-CM | POA: Diagnosis not present

## 2022-08-11 DIAGNOSIS — C7951 Secondary malignant neoplasm of bone: Secondary | ICD-10-CM | POA: Diagnosis not present

## 2022-08-11 DIAGNOSIS — Z9221 Personal history of antineoplastic chemotherapy: Secondary | ICD-10-CM | POA: Diagnosis not present

## 2022-08-11 DIAGNOSIS — Z510 Encounter for antineoplastic radiation therapy: Secondary | ICD-10-CM | POA: Diagnosis not present

## 2022-08-11 DIAGNOSIS — R339 Retention of urine, unspecified: Secondary | ICD-10-CM | POA: Diagnosis not present

## 2022-08-11 DIAGNOSIS — Z006 Encounter for examination for normal comparison and control in clinical research program: Secondary | ICD-10-CM | POA: Diagnosis not present

## 2022-08-11 DIAGNOSIS — Z923 Personal history of irradiation: Secondary | ICD-10-CM | POA: Diagnosis not present

## 2022-08-11 DIAGNOSIS — M533 Sacrococcygeal disorders, not elsewhere classified: Secondary | ICD-10-CM | POA: Diagnosis not present

## 2022-08-11 DIAGNOSIS — M545 Low back pain, unspecified: Secondary | ICD-10-CM | POA: Diagnosis not present

## 2022-08-11 DIAGNOSIS — C109 Malignant neoplasm of oropharynx, unspecified: Secondary | ICD-10-CM | POA: Diagnosis not present

## 2022-08-13 DIAGNOSIS — Z510 Encounter for antineoplastic radiation therapy: Secondary | ICD-10-CM | POA: Diagnosis not present

## 2022-08-13 DIAGNOSIS — C760 Malignant neoplasm of head, face and neck: Secondary | ICD-10-CM | POA: Diagnosis not present

## 2022-08-13 DIAGNOSIS — C7951 Secondary malignant neoplasm of bone: Secondary | ICD-10-CM | POA: Diagnosis not present

## 2022-08-13 DIAGNOSIS — R339 Retention of urine, unspecified: Secondary | ICD-10-CM | POA: Diagnosis not present

## 2022-08-13 DIAGNOSIS — Z9221 Personal history of antineoplastic chemotherapy: Secondary | ICD-10-CM | POA: Diagnosis not present

## 2022-08-13 DIAGNOSIS — C099 Malignant neoplasm of tonsil, unspecified: Secondary | ICD-10-CM | POA: Diagnosis not present

## 2022-08-13 DIAGNOSIS — M533 Sacrococcygeal disorders, not elsewhere classified: Secondary | ICD-10-CM | POA: Diagnosis not present

## 2022-08-13 DIAGNOSIS — C109 Malignant neoplasm of oropharynx, unspecified: Secondary | ICD-10-CM | POA: Diagnosis not present

## 2022-08-13 DIAGNOSIS — Z006 Encounter for examination for normal comparison and control in clinical research program: Secondary | ICD-10-CM | POA: Diagnosis not present

## 2022-08-16 DIAGNOSIS — Z510 Encounter for antineoplastic radiation therapy: Secondary | ICD-10-CM | POA: Diagnosis not present

## 2022-08-16 DIAGNOSIS — C099 Malignant neoplasm of tonsil, unspecified: Secondary | ICD-10-CM | POA: Diagnosis not present

## 2022-08-16 DIAGNOSIS — C7951 Secondary malignant neoplasm of bone: Secondary | ICD-10-CM | POA: Diagnosis not present

## 2022-08-16 DIAGNOSIS — Z006 Encounter for examination for normal comparison and control in clinical research program: Secondary | ICD-10-CM | POA: Diagnosis not present

## 2022-08-16 DIAGNOSIS — Z9221 Personal history of antineoplastic chemotherapy: Secondary | ICD-10-CM | POA: Diagnosis not present

## 2022-08-16 DIAGNOSIS — C109 Malignant neoplasm of oropharynx, unspecified: Secondary | ICD-10-CM | POA: Diagnosis not present

## 2022-08-16 DIAGNOSIS — R339 Retention of urine, unspecified: Secondary | ICD-10-CM | POA: Diagnosis not present

## 2022-08-16 DIAGNOSIS — M533 Sacrococcygeal disorders, not elsewhere classified: Secondary | ICD-10-CM | POA: Diagnosis not present

## 2022-08-18 DIAGNOSIS — Z9221 Personal history of antineoplastic chemotherapy: Secondary | ICD-10-CM | POA: Diagnosis not present

## 2022-08-18 DIAGNOSIS — M533 Sacrococcygeal disorders, not elsewhere classified: Secondary | ICD-10-CM | POA: Diagnosis not present

## 2022-08-18 DIAGNOSIS — Z510 Encounter for antineoplastic radiation therapy: Secondary | ICD-10-CM | POA: Diagnosis not present

## 2022-08-18 DIAGNOSIS — C099 Malignant neoplasm of tonsil, unspecified: Secondary | ICD-10-CM | POA: Diagnosis not present

## 2022-08-18 DIAGNOSIS — Z006 Encounter for examination for normal comparison and control in clinical research program: Secondary | ICD-10-CM | POA: Diagnosis not present

## 2022-08-18 DIAGNOSIS — R339 Retention of urine, unspecified: Secondary | ICD-10-CM | POA: Diagnosis not present

## 2022-08-18 DIAGNOSIS — C109 Malignant neoplasm of oropharynx, unspecified: Secondary | ICD-10-CM | POA: Diagnosis not present

## 2022-08-18 DIAGNOSIS — C7951 Secondary malignant neoplasm of bone: Secondary | ICD-10-CM | POA: Diagnosis not present

## 2022-08-19 ENCOUNTER — Telehealth: Payer: Self-pay

## 2022-08-19 DIAGNOSIS — Z471 Aftercare following joint replacement surgery: Secondary | ICD-10-CM | POA: Diagnosis not present

## 2022-08-19 DIAGNOSIS — M48061 Spinal stenosis, lumbar region without neurogenic claudication: Secondary | ICD-10-CM | POA: Diagnosis not present

## 2022-08-19 DIAGNOSIS — M5124 Other intervertebral disc displacement, thoracic region: Secondary | ICD-10-CM | POA: Diagnosis not present

## 2022-08-19 DIAGNOSIS — M4804 Spinal stenosis, thoracic region: Secondary | ICD-10-CM | POA: Diagnosis not present

## 2022-08-19 DIAGNOSIS — C7951 Secondary malignant neoplasm of bone: Secondary | ICD-10-CM | POA: Diagnosis not present

## 2022-08-19 NOTE — Telephone Encounter (Addendum)
Rn called pt to inquire about how he was doing. He stated quickly on the phone call that he was just leaving [place] and his immunotherapy was cancelled today. He stated this was because of medication not being held properly. He did say this immunotherapy appointment was rescheduled for August 25, 2022. He was instructed to hold follow up until this was therapy was complete. Rn will pass this message to Dr. Isidore Moos and we will reschedule him when appropriate.

## 2022-08-20 ENCOUNTER — Ambulatory Visit: Payer: BC Managed Care – PPO | Admitting: Radiation Oncology

## 2022-08-24 DIAGNOSIS — M84451A Pathological fracture, right femur, initial encounter for fracture: Secondary | ICD-10-CM | POA: Diagnosis not present

## 2022-08-25 DIAGNOSIS — C109 Malignant neoplasm of oropharynx, unspecified: Secondary | ICD-10-CM | POA: Diagnosis not present

## 2022-08-25 DIAGNOSIS — G4700 Insomnia, unspecified: Secondary | ICD-10-CM | POA: Diagnosis not present

## 2022-08-25 DIAGNOSIS — Z006 Encounter for examination for normal comparison and control in clinical research program: Secondary | ICD-10-CM | POA: Diagnosis not present

## 2022-08-25 DIAGNOSIS — K5900 Constipation, unspecified: Secondary | ICD-10-CM | POA: Diagnosis not present

## 2022-08-25 DIAGNOSIS — C760 Malignant neoplasm of head, face and neck: Secondary | ICD-10-CM | POA: Diagnosis not present

## 2022-08-25 DIAGNOSIS — Z5112 Encounter for antineoplastic immunotherapy: Secondary | ICD-10-CM | POA: Diagnosis not present

## 2022-08-25 DIAGNOSIS — N400 Enlarged prostate without lower urinary tract symptoms: Secondary | ICD-10-CM | POA: Diagnosis not present

## 2022-09-08 DIAGNOSIS — Z515 Encounter for palliative care: Secondary | ICD-10-CM | POA: Diagnosis not present

## 2022-09-08 DIAGNOSIS — C109 Malignant neoplasm of oropharynx, unspecified: Secondary | ICD-10-CM | POA: Diagnosis not present

## 2022-09-08 DIAGNOSIS — R110 Nausea: Secondary | ICD-10-CM | POA: Diagnosis not present

## 2022-09-08 DIAGNOSIS — G893 Neoplasm related pain (acute) (chronic): Secondary | ICD-10-CM | POA: Diagnosis not present

## 2022-09-15 DIAGNOSIS — C760 Malignant neoplasm of head, face and neck: Secondary | ICD-10-CM | POA: Diagnosis not present

## 2022-09-15 DIAGNOSIS — C109 Malignant neoplasm of oropharynx, unspecified: Secondary | ICD-10-CM | POA: Diagnosis not present

## 2022-09-15 DIAGNOSIS — R918 Other nonspecific abnormal finding of lung field: Secondary | ICD-10-CM | POA: Diagnosis not present

## 2022-09-15 DIAGNOSIS — R590 Localized enlarged lymph nodes: Secondary | ICD-10-CM | POA: Diagnosis not present

## 2022-09-22 DIAGNOSIS — C109 Malignant neoplasm of oropharynx, unspecified: Secondary | ICD-10-CM | POA: Diagnosis not present

## 2022-09-23 DIAGNOSIS — F418 Other specified anxiety disorders: Secondary | ICD-10-CM | POA: Diagnosis not present

## 2022-10-06 DIAGNOSIS — C109 Malignant neoplasm of oropharynx, unspecified: Secondary | ICD-10-CM | POA: Diagnosis not present

## 2022-10-12 DIAGNOSIS — C109 Malignant neoplasm of oropharynx, unspecified: Secondary | ICD-10-CM | POA: Diagnosis not present

## 2023-01-07 ENCOUNTER — Ambulatory Visit: Payer: BC Managed Care – PPO | Admitting: Hematology and Oncology

## 2023-02-03 ENCOUNTER — Encounter (HOSPITAL_BASED_OUTPATIENT_CLINIC_OR_DEPARTMENT_OTHER): Payer: Self-pay | Admitting: Emergency Medicine

## 2023-02-03 ENCOUNTER — Emergency Department (HOSPITAL_BASED_OUTPATIENT_CLINIC_OR_DEPARTMENT_OTHER)
Admission: EM | Admit: 2023-02-03 | Discharge: 2023-02-04 | Disposition: A | Discharge status: Home / Self Care | Payer: BC Managed Care – PPO | Attending: Emergency Medicine | Admitting: Emergency Medicine

## 2023-02-03 ENCOUNTER — Other Ambulatory Visit: Payer: Self-pay

## 2023-02-03 DIAGNOSIS — F17210 Nicotine dependence, cigarettes, uncomplicated: Secondary | ICD-10-CM | POA: Diagnosis not present

## 2023-02-03 DIAGNOSIS — E119 Type 2 diabetes mellitus without complications: Secondary | ICD-10-CM | POA: Insufficient documentation

## 2023-02-03 DIAGNOSIS — S0990XA Unspecified injury of head, initial encounter: Secondary | ICD-10-CM

## 2023-02-03 DIAGNOSIS — W01198A Fall on same level from slipping, tripping and stumbling with subsequent striking against other object, initial encounter: Secondary | ICD-10-CM | POA: Insufficient documentation

## 2023-02-03 DIAGNOSIS — Z7901 Long term (current) use of anticoagulants: Secondary | ICD-10-CM | POA: Diagnosis not present

## 2023-02-03 DIAGNOSIS — S0101XA Laceration without foreign body of scalp, initial encounter: Secondary | ICD-10-CM | POA: Insufficient documentation

## 2023-02-03 DIAGNOSIS — R55 Syncope and collapse: Secondary | ICD-10-CM | POA: Diagnosis not present

## 2023-02-03 HISTORY — DX: Type 2 diabetes mellitus without complications: E11.9

## 2023-02-03 LAB — CBC
HCT: 31.9 % — ABNORMAL LOW (ref 39.0–52.0)
Hemoglobin: 9.6 g/dL — ABNORMAL LOW (ref 13.0–17.0)
MCH: 25.8 pg — ABNORMAL LOW (ref 26.0–34.0)
MCHC: 30.1 g/dL (ref 30.0–36.0)
MCV: 85.8 fL (ref 80.0–100.0)
Platelets: 223 10*3/uL (ref 150–400)
RBC: 3.72 MIL/uL — ABNORMAL LOW (ref 4.22–5.81)
RDW: 20 % — ABNORMAL HIGH (ref 11.5–15.5)
WBC: 3.7 10*3/uL — ABNORMAL LOW (ref 4.0–10.5)
nRBC: 0 % (ref 0.0–0.2)

## 2023-02-03 NOTE — ED Triage Notes (Signed)
Arrives POV to ED from home. Ambulatory to room. NAD. A+Ox4.  Standup during intercourse and report LOC. Fell back and hit head on piece of furniture. 5cm lac to back of head- reports little to no bleeding. Recovered quickly according to partner. Pupils = reactive 14mm. Denies neck back pain. Denies CP, denies SOB. Chemo yesterday for bone cancer.

## 2023-02-03 NOTE — ED Provider Notes (Signed)
[facility] Provider Note: Lowella Dell, MD, FACEP  CSN: [id number] MRN: [id number] ARRIVAL: 02/03/23 at 2329 ROOM: DB011/DB011   CHIEF COMPLAINT  Syncope   HISTORY OF PRESENT ILLNESS  02/03/23 11:53 PM Justin Baldwin is a 47 y.o. 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  He was having sexual intercourse in a standing position just prior to arrival when he had a syncopal episode.  He thinks he felt a little "dark around the edges" before it happened.  He struck the back of his head and has a laceration to his occiput.  He rates associated pain as a 2 out of 10.  He denies other injury.  He is feeling nauseated but has not been vomiting.   Past Medical History:  Diagnosis Date   Arthritis    Dizziness 11/30/2018   DM (diabetes mellitus)    Erectile dysfunction    GERD (gastroesophageal reflux disease)    Insomnia    Skin abnormality    hidradenitis suppurativa    Throat cancer     Past Surgical History:  Procedure Laterality Date   BONE BIOPSY Right 06/02/2022   Procedure: OPEN BIOPSY RIGHT FEMUR;  Surgeon: Samson Frederic, MD;  Location: [facility];  Service: Orthopedics;  Laterality: Right;   IR GASTROSTOMY TUBE MOD SED  11/21/2020   IR GASTROSTOMY TUBE REMOVAL  02/27/2021   IR IMAGING GUIDED PORT INSERTION  11/21/2020   IR REMOVAL TUN ACCESS W/ PORT W/O FL MOD SED  06/05/2021   IRRIGATION AND DEBRIDEMENT ABSCESS N/A 09/22/2018   Procedure: IRRIGATION AND DEBRIDEMENT ABSCESS;  Surgeon: Axel Filler, MD;  Location: [facility];  Service: General;  Laterality: N/A;   TOTAL HIP ARTHROPLASTY Right 06/02/2022   Procedure: TOTAL HIP ARTHROPLASTY ANTERIOR APPROACH;  Surgeon: Samson Frederic, MD;  Location: [facility];  Service: Orthopedics;  Laterality: Right;    Family History  Problem Relation Age of Onset   Heart attack Father    Emphysema Paternal Grandmother    Arthritis Mother     Social History   Tobacco Use   Smoking status:  Every Day    Packs/day: 1    Types: Cigarettes    Last attempt to quit: 10/27/2020    Years since quitting: 2.2   Smokeless tobacco: Former    Quit date: 02/14/2016  Vaping Use   Vaping Use: Never used  Substance Use Topics   Alcohol use: Yes    Alcohol/week: 5.0 standard drinks of alcohol    Types: 5 Shots of liquor per week    Comment: occasinal    Drug use: No    Prior to Admission medications   Medication Sig Start Date End Date Taking? Authorizing Provider  anastrozole (ARIMIDEX) 1 MG tablet Take 1 mg by mouth once a week. 11/02/21   [provider]  apixaban (ELIQUIS) 2.5 MG TABS tablet Take 1 tablet (2.5 mg total) by mouth 2 (two) times daily. 06/02/22   Swinteck, Arlys Yonah Tangeman, MD  dexamethasone (DECADRON) 4 MG tablet Take 1 tablet (4 mg total) by mouth daily. 07/19/22   Pickenpack-Cousar, Arty Baumgartner, NP  famotidine (PEPCID) 20 MG tablet TAKE 1 TABLET BY MOUTH ONCE DAILY For heartburn 08/04/21   Doreene Nest, NP  magnesium oxide (MAG-OX) 400 (241.3 Mg) MG tablet Take 1 tablet (400 mg total) by mouth 2 (two) times daily. Patient taking differently: Take 400 mg by mouth daily. 01/13/21   Rachel Moulds, MD  Omega-3 Fatty Acids (FISH OIL PO) Take 1  capsule by mouth daily.    [provider]  omeprazole (PRILOSEC) 20 MG capsule Take 20 mg by mouth daily. 03/18/21   [provider]  ondansetron (ZOFRAN) 4 MG tablet Take 1 tablet (4 mg total) by mouth every 8 (eight) hours as needed for nausea or vomiting. 06/02/22   Swinteck, Arlys Steph Cheadle, MD  oxymetazoline (AFRIN) 0.05 % nasal spray Place 1-2 sprays into both nostrils as needed for congestion.     [provider]  polyethylene glycol (MIRALAX) 17 g packet Take 17 g by mouth daily as needed for moderate constipation or severe constipation. 06/02/22   Swinteck, Arlys Teandre Hamre, MD  TURMERIC PO Take 1 capsule by mouth daily.    [provider]    Allergies Patient has no known allergies.   REVIEW OF SYSTEMS  Negative  except as noted here or in the History of Present Illness.   PHYSICAL EXAMINATION  Initial Vital Signs Blood pressure (!) 101/57, pulse 74, temperature 98.1 F (36.7 C), temperature source Oral, resp. rate 14, height 6' (1.829 m), weight 92.7 kg, SpO2 93 %.  Examination General: Well-developed, well-nourished male in no acute distress; appearance consistent with age of record HENT: normocephalic; posterior scalp laceration:    Eyes: pupils equal, round and reactive to light; extraocular muscles intact Neck: supple; nontender Heart: regular rate and rhythm Lungs: clear to auscultation bilaterally Abdomen: soft; nondistended; nontender; bowel sounds present Extremities: No deformity; full range of motion; pulses normal Neurologic: Awake, alert and oriented; motor function intact in all extremities and symmetric; no facial droop Skin: Warm and dry Psychiatric: Normal mood and affect   RESULTS  Summary of this visit's results, reviewed and interpreted by myself:   EKG Interpretation  Date/Time:  Thursday February 03 2023 23:35:48 EDT Ventricular Rate:  74 PR Interval:  132 QRS Duration: 98 QT Interval:  397 QTC Calculation: 441 R Axis:   5 Text Interpretation: Sinus rhythm RSR' in V1 or V2, right VCD or RVH No significant change was found Confirmed by Paula Libra ([id number]) on 02/03/2023 11:38:21 PM       Laboratory Studies: Results for orders placed or performed during the hospital encounter of 02/03/23 (from the past 24 hour(s))  Basic metabolic panel     Status: Abnormal   Collection Time: 02/03/23 11:43 PM  Result Value Ref Range   Sodium 136 135 - 145 mmol/L   Potassium 4.3 3.5 - 5.1 mmol/L   Chloride 99 98 - 111 mmol/L   CO2 28 22 - 32 mmol/L   Glucose, Bld 223 (H) 70 - 99 mg/dL   BUN 24 (H) 6 - 20 mg/dL   Creatinine, Ser 6.04 (H) 0.61 - 1.24 mg/dL   Calcium 8.3 (L) 8.9 - 10.3 mg/dL   GFR, Estimated 59 (L) >60 mL/min   Anion gap 9 5 - 15  CBC     Status: Abnormal    Collection Time: 02/03/23 11:43 PM  Result Value Ref Range   WBC 3.7 (L) 4.0 - 10.5 K/uL   RBC 3.72 (L) 4.22 - 5.81 MIL/uL   Hemoglobin 9.6 (L) 13.0 - 17.0 g/dL   HCT 54.0 (L) 98.1 - 19.1 %   MCV 85.8 80.0 - 100.0 fL   MCH 25.8 (L) 26.0 - 34.0 pg   MCHC 30.1 30.0 - 36.0 g/dL   RDW 47.8 (H) 29.5 - 62.1 %   Platelets 223 150 - 400 K/uL   nRBC 0.0 0.0 - 0.2 %  CBG monitoring, ED  Status: Abnormal   Collection Time: 02/03/23 11:53 PM  Result Value Ref Range   Glucose-Capillary 225 (H) 70 - 99 mg/dL  Urinalysis, Routine w reflex microscopic -Urine, Clean Catch     Status: Abnormal   Collection Time: 02/04/23  2:00 AM  Result Value Ref Range   Color, Urine YELLOW YELLOW   APPearance CLEAR CLEAR   Specific Gravity, Urine 1.028 1.005 - 1.030   pH 6.5 5.0 - 8.0   Glucose, UA NEGATIVE NEGATIVE mg/dL   Hgb urine dipstick NEGATIVE NEGATIVE   Bilirubin Urine NEGATIVE NEGATIVE   Ketones, ur 15 (A) NEGATIVE mg/dL   Protein, ur 30 (A) NEGATIVE mg/dL   Nitrite NEGATIVE NEGATIVE   Leukocytes,Ua SMALL (A) NEGATIVE   RBC / HPF 0-5 0 - 5 RBC/hpf   WBC, UA 11-20 0 - 5 WBC/hpf   Bacteria, UA NONE SEEN NONE SEEN   Squamous Epithelial / HPF 0-5 0 - 5 /HPF   Mucus PRESENT    Hyaline Casts, UA PRESENT    Imaging Studies: CT Head Wo Contrast  Result Date: 02/04/2023 CLINICAL DATA:  Head trauma. EXAM: CT HEAD WITHOUT CONTRAST TECHNIQUE: Contiguous axial images were obtained from the base of the skull through the vertex without intravenous contrast. RADIATION DOSE REDUCTION: This exam was performed according to the departmental dose-optimization program which includes automated exposure control, adjustment of the mA and/or kV according to patient size and/or use of iterative reconstruction technique. COMPARISON:  07/05/2017. FINDINGS: Brain: No acute intracranial hemorrhage, midline shift or mass effect. No extra-axial fluid collection. Gray-white matter differentiation is within normal limits. No  hydrocephalus. Vascular: No hyperdense vessel or unexpected calcification. Skull: No acute fracture. Sinuses/Orbits: Mild mucosal thickening is noted in the paranasal sinuses. No acute abnormality. Other: A chronic mastoid effusion is noted on the right. IMPRESSION: No acute intracranial process. Electronically Signed   By: Thornell Sartorius M.D.   On: 02/04/2023 00:46    ED COURSE and MDM  Nursing notes, initial and subsequent vitals signs, including pulse oximetry, reviewed and interpreted by myself.  Vitals:   02/03/23 2336 02/03/23 2342 02/04/23 0030 02/04/23 0218  BP: (!) 101/57  115/73 127/75  Pulse: 74  65 (!) 56  Resp: Temp: 98.1 F (36.7 C)     TempSrc: Oral     SpO2: 93%  94% 95%  Weight:  92.7 kg    Height:  6' (1.829 m)     Medications  oxymetazoline (AFRIN) 0.05 % nasal spray 2 spray (2 sprays Each Nare Given 02/04/23 0205)  sodium chloride 0.9 % bolus 1,000 mL (has no administration in time range)  sodium chloride 0.9 % bolus 1,000 mL (has no administration in time range)   2:25 AM Urinalysis shows high specific gravity suggesting mild dehydration.  We will give the patient a liter of normal saline.  Urine is equivocal for urinary tract infection and has been sent for culture.  I suspect his syncopal episode was due to a combination of dehydration, recent chemotherapy and sexual activity.   PROCEDURES  Procedures LACERATION REPAIR Performed by: Carlisle Beers Cybill Uriegas Authorized by: Carlisle Beers Rachelann Enloe Consent: Verbal consent obtained. Risks and benefits: risks, benefits and alternatives were discussed Consent given by: patient Patient identity confirmed: provided demographic data Prepped and Draped in normal sterile fashion Wound explored  Laceration Location: Occiput  Laceration Length: 6 cm  No Foreign Bodies seen or palpated  Anesthesia: None  Irrigation method: syringe Amount of cleaning: standard  Skin closure: Dermabond, overlaid with Steri-Strips held in  place with benzoin, with additional layer of Dermabond over this:    Patient tolerance: Patient tolerated the procedure well with no immediate complications.   ED DIAGNOSES     ICD-10-CM   1. Syncope and collapse  R55     2. Scalp laceration, initial encounter  S01.01XA     3. Minor head injury, initial encounter  S09.90XA          Paula Libra, MD 02/04/23 [phone]

## 2023-02-04 ENCOUNTER — Emergency Department (HOSPITAL_BASED_OUTPATIENT_CLINIC_OR_DEPARTMENT_OTHER): Payer: BC Managed Care – PPO

## 2023-02-04 LAB — URINALYSIS, ROUTINE W REFLEX MICROSCOPIC
Bacteria, UA: NONE SEEN
Bilirubin Urine: NEGATIVE
Glucose, UA: NEGATIVE mg/dL
Hgb urine dipstick: NEGATIVE
Ketones, ur: 15 mg/dL — AB
Nitrite: NEGATIVE
Protein, ur: 30 mg/dL — AB
Specific Gravity, Urine: 1.028 (ref 1.005–1.030)
pH: 6.5 (ref 5.0–8.0)

## 2023-02-04 LAB — BASIC METABOLIC PANEL
Anion gap: 9 (ref 5–15)
BUN: 24 mg/dL — ABNORMAL HIGH (ref 6–20)
CO2: 28 mmol/L (ref 22–32)
Calcium: 8.3 mg/dL — ABNORMAL LOW (ref 8.9–10.3)
Chloride: 99 mmol/L (ref 98–111)
Creatinine, Ser: 1.47 mg/dL — ABNORMAL HIGH (ref 0.61–1.24)
GFR, Estimated: 59 mL/min — ABNORMAL LOW (ref >60)
Glucose, Bld: 223 mg/dL — ABNORMAL HIGH (ref 70–99)
Potassium: 4.3 mmol/L (ref 3.5–5.1)
Sodium: 136 mmol/L (ref 135–145)

## 2023-02-04 LAB — CBG MONITORING, ED: Glucose-Capillary: 225 mg/dL — ABNORMAL HIGH (ref 70–99)

## 2023-02-04 MED ORDER — SODIUM CHLORIDE 0.9 % IV BOLUS: 1000.0000 mL | Freq: Once | INTRAVENOUS | Status: DC

## 2023-02-04 MED ORDER — SODIUM CHLORIDE 0.9 % IV BOLUS
1000.0000 mL | Freq: Once | INTRAVENOUS | Status: AC
  Administered 2023-02-04: 1000 mL via INTRAVENOUS

## 2023-02-04 MED ORDER — OXYMETAZOLINE HCL 0.05 % NA SOLN
2.0000 | Freq: Two times a day (BID) | NASAL | Status: DC | PRN
  Administered 2023-02-04: 2 via NASAL
  Filled 2023-02-04: qty 30

## 2023-02-05 LAB — URINE CULTURE: Culture: NO GROWTH

## 2023-02-21 ENCOUNTER — Other Ambulatory Visit: Payer: Self-pay | Admitting: Hematology and Oncology

## 2023-02-21 DIAGNOSIS — C109 Malignant neoplasm of oropharynx, unspecified: Secondary | ICD-10-CM

## 2023-02-21 DIAGNOSIS — C7951 Secondary malignant neoplasm of bone: Secondary | ICD-10-CM

## 2023-03-01 ENCOUNTER — Ambulatory Visit
Admission: RE | Admit: 2023-03-01 | Discharge: 2023-03-01 | Disposition: A | Discharge status: Home / Self Care | Payer: BC Managed Care – PPO | Source: Ambulatory Visit | Attending: Hematology and Oncology | Admitting: Hematology and Oncology

## 2023-03-01 DIAGNOSIS — C7951 Secondary malignant neoplasm of bone: Secondary | ICD-10-CM

## 2023-03-01 DIAGNOSIS — C109 Malignant neoplasm of oropharynx, unspecified: Secondary | ICD-10-CM

## 2023-03-01 MED ORDER — IOPAMIDOL (ISOVUE-300) INJECTION 61%
100.0000 mL | Freq: Once | INTRAVENOUS | Status: AC | PRN
  Administered 2023-03-01: 100 mL via INTRAVENOUS

## 2023-04-29 ENCOUNTER — Other Ambulatory Visit: Payer: Self-pay

## 2023-04-29 ENCOUNTER — Emergency Department (HOSPITAL_BASED_OUTPATIENT_CLINIC_OR_DEPARTMENT_OTHER): Payer: BC Managed Care – PPO

## 2023-04-29 ENCOUNTER — Observation Stay (HOSPITAL_BASED_OUTPATIENT_CLINIC_OR_DEPARTMENT_OTHER)
Admission: EM | Admit: 2023-04-29 | Discharge: 2023-04-30 | Disposition: A | Discharge status: Home / Self Care | Payer: BC Managed Care – PPO | Attending: Family Medicine | Admitting: Family Medicine

## 2023-04-29 ENCOUNTER — Emergency Department (HOSPITAL_BASED_OUTPATIENT_CLINIC_OR_DEPARTMENT_OTHER): Payer: BC Managed Care – PPO | Admitting: Radiology

## 2023-04-29 ENCOUNTER — Encounter (HOSPITAL_BASED_OUTPATIENT_CLINIC_OR_DEPARTMENT_OTHER): Payer: Self-pay | Admitting: Emergency Medicine

## 2023-04-29 DIAGNOSIS — E119 Type 2 diabetes mellitus without complications: Secondary | ICD-10-CM | POA: Diagnosis not present

## 2023-04-29 DIAGNOSIS — C7951 Secondary malignant neoplasm of bone: Secondary | ICD-10-CM

## 2023-04-29 DIAGNOSIS — M25559 Pain in unspecified hip: Secondary | ICD-10-CM | POA: Diagnosis present

## 2023-04-29 DIAGNOSIS — D519 Vitamin B12 deficiency anemia, unspecified: Secondary | ICD-10-CM | POA: Diagnosis not present

## 2023-04-29 DIAGNOSIS — M25552 Pain in left hip: Secondary | ICD-10-CM | POA: Diagnosis present

## 2023-04-29 DIAGNOSIS — D72819 Decreased white blood cell count, unspecified: Secondary | ICD-10-CM | POA: Diagnosis not present

## 2023-04-29 DIAGNOSIS — Z96641 Presence of right artificial hip joint: Secondary | ICD-10-CM | POA: Diagnosis not present

## 2023-04-29 DIAGNOSIS — Z85818 Personal history of malignant neoplasm of other sites of lip, oral cavity, and pharynx: Secondary | ICD-10-CM | POA: Insufficient documentation

## 2023-04-29 DIAGNOSIS — Z87891 Personal history of nicotine dependence: Secondary | ICD-10-CM | POA: Diagnosis not present

## 2023-04-29 DIAGNOSIS — C090 Malignant neoplasm of tonsillar fossa: Secondary | ICD-10-CM | POA: Diagnosis present

## 2023-04-29 DIAGNOSIS — K5903 Drug induced constipation: Secondary | ICD-10-CM | POA: Diagnosis not present

## 2023-04-29 DIAGNOSIS — D529 Folate deficiency anemia, unspecified: Secondary | ICD-10-CM | POA: Insufficient documentation

## 2023-04-29 DIAGNOSIS — E538 Deficiency of other specified B group vitamins: Secondary | ICD-10-CM | POA: Diagnosis present

## 2023-04-29 DIAGNOSIS — R52 Pain, unspecified: Secondary | ICD-10-CM | POA: Diagnosis not present

## 2023-04-29 LAB — BASIC METABOLIC PANEL
Anion gap: 13 (ref 5–15)
BUN: 12 mg/dL (ref 6–20)
CO2: 22 mmol/L (ref 22–32)
Calcium: 9 mg/dL (ref 8.9–10.3)
Chloride: 99 mmol/L (ref 98–111)
Creatinine, Ser: 0.85 mg/dL (ref 0.61–1.24)
GFR, Estimated: >60 mL/min (ref >60)
Glucose, Bld: 121 mg/dL — ABNORMAL HIGH (ref 70–99)
Potassium: 3.9 mmol/L (ref 3.5–5.1)
Sodium: 134 mmol/L — ABNORMAL LOW (ref 135–145)

## 2023-04-29 LAB — CBC WITH DIFFERENTIAL/PLATELET
Abs Immature Granulocytes: 0.01 10*3/uL (ref 0.00–0.07)
Basophils Absolute: 0 10*3/uL (ref 0.0–0.1)
Basophils Relative: 1 %
Eosinophils Absolute: 0.1 10*3/uL (ref 0.0–0.5)
Eosinophils Relative: 2 %
HCT: 33.9 % — ABNORMAL LOW (ref 39.0–52.0)
Hemoglobin: 10.4 g/dL — ABNORMAL LOW (ref 13.0–17.0)
Immature Granulocytes: 0 %
Lymphocytes Relative: 19 %
Lymphs Abs: 0.6 10*3/uL — ABNORMAL LOW (ref 0.7–4.0)
MCH: 27.2 pg (ref 26.0–34.0)
MCHC: 30.7 g/dL (ref 30.0–36.0)
MCV: 88.7 fL (ref 80.0–100.0)
Monocytes Absolute: 0.5 10*3/uL (ref 0.1–1.0)
Monocytes Relative: 17 %
Neutro Abs: 1.9 10*3/uL (ref 1.7–7.7)
Neutrophils Relative %: 61 %
Platelets: 211 10*3/uL (ref 150–400)
RBC: 3.82 MIL/uL — ABNORMAL LOW (ref 4.22–5.81)
RDW: 18.1 % — ABNORMAL HIGH (ref 11.5–15.5)
WBC: 3.1 10*3/uL — ABNORMAL LOW (ref 4.0–10.5)
nRBC: 0 % (ref 0.0–0.2)

## 2023-04-29 MED ORDER — HYDROMORPHONE HCL 1 MG/ML IJ SOLN
1.0000 mg | INTRAMUSCULAR | Status: DC | PRN
  Administered 2023-04-29 – 2023-04-30 (×5): 1 mg via INTRAVENOUS
  Filled 2023-04-29 (×5): qty 1

## 2023-04-29 MED ORDER — FENTANYL CITRATE PF 50 MCG/ML IJ SOSY
50.0000 ug | PREFILLED_SYRINGE | INTRAMUSCULAR | Status: DC | PRN
  Administered 2023-04-29 – 2023-04-30 (×4): 50 ug via INTRAVENOUS
  Filled 2023-04-29 (×5): qty 1

## 2023-04-29 MED ORDER — ACETAMINOPHEN 325 MG PO TABS: 650.0000 mg | ORAL_TABLET | Freq: Four times a day (QID) | ORAL | Status: DC | PRN

## 2023-04-29 MED ORDER — DEXTROSE IN LACTATED RINGERS 5 % IV SOLN: INTRAVENOUS | Status: DC

## 2023-04-29 MED ORDER — CHLORHEXIDINE GLUCONATE CLOTH 2 % EX PADS
6.0000 | MEDICATED_PAD | Freq: Every day | CUTANEOUS | Status: DC
  Administered 2023-04-30: 6 via TOPICAL

## 2023-04-29 MED ORDER — ONDANSETRON HCL 4 MG/2ML IJ SOLN: 4.0000 mg | Freq: Once | INTRAMUSCULAR | Status: DC

## 2023-04-29 MED ORDER — BISACODYL 5 MG PO TBEC
5.0000 mg | DELAYED_RELEASE_TABLET | Freq: Two times a day (BID) | ORAL | Status: DC
  Administered 2023-04-29: 5 mg via ORAL
  Filled 2023-04-29 (×2): qty 1

## 2023-04-29 MED ORDER — ACETAMINOPHEN 650 MG RE SUPP: 650.0000 mg | Freq: Four times a day (QID) | RECTAL | Status: DC | PRN

## 2023-04-29 MED ORDER — HYDROMORPHONE HCL 1 MG/ML IJ SOLN
1.0000 mg | Freq: Once | INTRAMUSCULAR | Status: AC
  Administered 2023-04-29: 1 mg via INTRAVENOUS
  Filled 2023-04-29: qty 1

## 2023-04-29 MED ORDER — TRAZODONE HCL 50 MG PO TABS: 50.0000 mg | ORAL_TABLET | Freq: Every evening | ORAL | Status: DC | PRN

## 2023-04-29 MED ORDER — METHADONE HCL 10 MG PO TABS
10.0000 mg | ORAL_TABLET | Freq: Three times a day (TID) | ORAL | Status: DC
  Administered 2023-04-30 (×3): 10 mg via ORAL
  Filled 2023-04-29 (×3): qty 1

## 2023-04-29 MED ORDER — TAMSULOSIN HCL 0.4 MG PO CAPS
0.4000 mg | ORAL_CAPSULE | Freq: Every day | ORAL | Status: DC
  Administered 2023-04-30: 0.4 mg via ORAL
  Filled 2023-04-29: qty 1

## 2023-04-29 MED ORDER — HYDROMORPHONE HCL 1 MG/ML IJ SOLN
2.0000 mg | Freq: Once | INTRAMUSCULAR | Status: AC
  Administered 2023-04-29: 2 mg via INTRAVENOUS
  Filled 2023-04-29: qty 2

## 2023-04-29 MED ORDER — METHYLNALTREXONE BROMIDE 12 MG/0.6ML ~~LOC~~ SOLN
12.0000 mg | Freq: Once | SUBCUTANEOUS | Status: AC
  Administered 2023-04-29: 12 mg via SUBCUTANEOUS
  Filled 2023-04-29 (×2): qty 0.6

## 2023-04-29 MED ORDER — CALCIUM CARBONATE 1250 (500 CA) MG PO TABS
1.0000 | ORAL_TABLET | Freq: Two times a day (BID) | ORAL | Status: DC
  Administered 2023-04-30 (×2): 1250 mg via ORAL
  Filled 2023-04-29 (×2): qty 1

## 2023-04-29 MED ORDER — PREGABALIN 75 MG PO CAPS
150.0000 mg | ORAL_CAPSULE | Freq: Three times a day (TID) | ORAL | Status: DC
  Administered 2023-04-30 (×2): 150 mg via ORAL
  Filled 2023-04-29 (×2): qty 2

## 2023-04-29 MED ORDER — MORPHINE SULFATE (PF) 4 MG/ML IV SOLN
4.0000 mg | Freq: Once | INTRAVENOUS | Status: AC
  Administered 2023-04-29: 4 mg via INTRAVENOUS
  Filled 2023-04-29: qty 1

## 2023-04-29 MED ORDER — LACTULOSE ENEMA
300.0000 mL | Freq: Once | ORAL | Status: DC
  Filled 2023-04-29: qty 300

## 2023-04-29 MED ORDER — FENTANYL 75 MCG/HR TD PT72
1.0000 | MEDICATED_PATCH | TRANSDERMAL | Status: DC
  Administered 2023-04-29: 1 via TRANSDERMAL
  Filled 2023-04-29: qty 1

## 2023-04-29 MED ORDER — LEVOTHYROXINE SODIUM 25 MCG PO TABS
25.0000 ug | ORAL_TABLET | Freq: Every day | ORAL | Status: DC
  Administered 2023-04-30: 25 ug via ORAL
  Filled 2023-04-29: qty 1

## 2023-04-29 MED ORDER — PANTOPRAZOLE SODIUM 20 MG PO TBEC
20.0000 mg | DELAYED_RELEASE_TABLET | Freq: Every day | ORAL | Status: DC
  Administered 2023-04-30: 20 mg via ORAL
  Filled 2023-04-29: qty 1

## 2023-04-29 MED ORDER — POLYETHYLENE GLYCOL 3350 17 G PO PACK: 17.0000 g | PACK | Freq: Every day | ORAL | Status: DC | PRN

## 2023-04-29 MED ORDER — ONDANSETRON HCL 4 MG/2ML IJ SOLN
4.0000 mg | Freq: Four times a day (QID) | INTRAMUSCULAR | Status: DC | PRN
  Administered 2023-04-30: 4 mg via INTRAVENOUS
  Filled 2023-04-29: qty 2

## 2023-04-29 MED ORDER — FENTANYL CITRATE PF 50 MCG/ML IJ SOSY
100.0000 ug | PREFILLED_SYRINGE | Freq: Once | INTRAMUSCULAR | Status: AC
  Administered 2023-04-29: 100 ug via INTRAVENOUS
  Filled 2023-04-29: qty 2

## 2023-04-29 MED ORDER — FAMOTIDINE 20 MG PO TABS
20.0000 mg | ORAL_TABLET | Freq: Every day | ORAL | Status: DC
  Administered 2023-04-30: 20 mg via ORAL
  Filled 2023-04-29: qty 1

## 2023-04-29 MED ORDER — SODIUM CHLORIDE 0.9% FLUSH
3.0000 mL | Freq: Two times a day (BID) | INTRAVENOUS | Status: DC
  Administered 2023-04-29 – 2023-04-30 (×2): 3 mL via INTRAVENOUS

## 2023-04-29 MED ORDER — CELECOXIB 200 MG PO CAPS
200.0000 mg | ORAL_CAPSULE | Freq: Two times a day (BID) | ORAL | Status: DC
  Administered 2023-04-29 – 2023-04-30 (×2): 200 mg via ORAL
  Filled 2023-04-29 (×3): qty 1

## 2023-04-29 MED ORDER — POLYETHYLENE GLYCOL 3350 17 G PO PACK
17.0000 g | PACK | Freq: Two times a day (BID) | ORAL | Status: DC
  Administered 2023-04-29 – 2023-04-30 (×2): 17 g via ORAL
  Filled 2023-04-29 (×2): qty 1

## 2023-04-29 MED ORDER — ENOXAPARIN SODIUM 40 MG/0.4ML IJ SOSY
40.0000 mg | PREFILLED_SYRINGE | INTRAMUSCULAR | Status: DC
  Administered 2023-04-30: 40 mg via SUBCUTANEOUS
  Filled 2023-04-29: qty 0.4

## 2023-04-29 NOTE — ED Triage Notes (Signed)
Pt arrives pov, to triage in wheelchair, endorses stage 4 bone cancer, c/o LT hip and leg pain, concern for fx.

## 2023-04-29 NOTE — ED Notes (Signed)
It is ok to give this patient a cup of water per PA

## 2023-04-29 NOTE — ED Notes (Signed)
Called [facility] to transport patient to [facility] rm# [phone]

## 2023-04-29 NOTE — Assessment & Plan Note (Signed)
On the left side, that has been going on for at least a month, review of record indicates that the patient had an MRI done on March 28, 2023 with finding of pathological fracture of the left inferior pubic rami.  This is not again demonstrated today on the CAT scan.  I believe that the patient may have more fractures or possibly referred pain from either the knee or the lower back that is causing his ongoing pain.  Unfortunately patient is having such severe pain at this time that he cannot tolerate an MRI.  Patient reports having an excellent clinical response to fentanyl that was given in the ER, reports having rather poor response to Dilaudid.  Therefore I will start the patient on fentanyl patch 75 mcg/h.  With Dilaudid and fentanyl ordered as needed for breakthrough pain.  Once the patient's pain is controlled I think we can pursue more investigations namely MRI of the pelvis and MRI of the lumbar spine to figure out.  In the interim I will order an x-ray of the left knee to make sure were not missing referred pain from there.

## 2023-04-29 NOTE — ED Provider Notes (Signed)
[place] EMERGENCY DEPARTMENT AT [facility] Provider Note   CSN: [id number] Arrival date & time: 04/29/23  4098     History  Chief Complaint  Patient presents with   Hip Pain    Justin Baldwin is a 47 y.o. male with past medical history significant for throat cancer. metastatic squamous cell carcinoma to bone, GERD, diabetes, arthritis presents to the ED complaining of severe left hip pain and leg pain with concern for fracture.  Patient has known lytic lesions in the bone and had a sudden increase in his pain 2 days ago.  Patient has had slowly increasing pain over the past month, but states it is now to a point where he cannot stand or walk.  Patient states he is unable to stand to shower or even to urinate.  Patient takes methadone and Dilaudid daily to help with pain, but states he has had no relief in his symptoms.  He is actively undergoing chemotherapy treatment.  Denies numbness or weakness in the lower extremities, fever, loss of bladder or bowel control, dysuria, back pain.       Home Medications Prior to Admission medications   Medication Sig Start Date End Date Taking? Authorizing Provider  anastrozole (ARIMIDEX) 1 MG tablet Take 1 mg by mouth once a week. 11/02/21   [provider]  apixaban (ELIQUIS) 2.5 MG TABS tablet Take 1 tablet (2.5 mg total) by mouth 2 (two) times daily. 06/02/22   Swinteck, Arlys John, MD  dexamethasone (DECADRON) 4 MG tablet Take 1 tablet (4 mg total) by mouth daily. 07/19/22   Pickenpack-Cousar, Arty Baumgartner, NP  famotidine (PEPCID) 20 MG tablet TAKE 1 TABLET BY MOUTH ONCE DAILY For heartburn 08/04/21   Doreene Nest, NP  magnesium oxide (MAG-OX) 400 (241.3 Mg) MG tablet Take 1 tablet (400 mg total) by mouth 2 (two) times daily. Patient taking differently: Take 400 mg by mouth daily. 01/13/21   Rachel Moulds, MD  Omega-3 Fatty Acids (FISH OIL PO) Take 1 capsule by mouth daily.    [provider]  omeprazole (PRILOSEC) 20 MG  capsule Take 20 mg by mouth daily. 03/18/21   [provider]  ondansetron (ZOFRAN) 4 MG tablet Take 1 tablet (4 mg total) by mouth every 8 (eight) hours as needed for nausea or vomiting. 06/02/22   Swinteck, Arlys John, MD  oxymetazoline (AFRIN) 0.05 % nasal spray Place 1-2 sprays into both nostrils as needed for congestion.     [provider]  polyethylene glycol (MIRALAX) 17 g packet Take 17 g by mouth daily as needed for moderate constipation or severe constipation. 06/02/22   Swinteck, Arlys John, MD  TURMERIC PO Take 1 capsule by mouth daily.    [provider]      Allergies    Prednisone    Review of Systems   Review of Systems  Constitutional:  Negative for fever.  Genitourinary:  Negative for dysuria.  Musculoskeletal:  Positive for arthralgias (L hip pain radiating into L leg) and gait problem. Negative for back pain.  Neurological:  Negative for weakness and numbness.    Physical Exam Updated Vital Signs BP 139/86   Pulse 74   Temp 98.5 F (36.9 C) (Oral)   Resp 15   Ht 6' (1.829 m)   Wt 87.1 kg   SpO2 98%   BMI 26.04 kg/m  Physical Exam Vitals and nursing note reviewed.  Constitutional:      General: He is not in acute distress.  Appearance: Normal appearance. He is not ill-appearing or diaphoretic.  Cardiovascular:     Rate and Rhythm: Normal rate and regular rhythm.  Pulmonary:     Effort: Pulmonary effort is normal.  Musculoskeletal:     Left hip: Tenderness and bony tenderness present. No deformity or crepitus. Decreased range of motion.  Neurological:     Mental Status: He is alert. Mental status is at baseline.  Psychiatric:        Mood and Affect: Mood normal.        Behavior: Behavior normal.     ED Results / Procedures / Treatments   Labs (all labs ordered are listed, but only abnormal results are displayed) Labs Reviewed  BASIC METABOLIC PANEL - Abnormal; Notable for the following components:      Result Value   Sodium 134  (*)    Glucose, Bld 121 (*)    All other components within normal limits  CBC WITH DIFFERENTIAL/PLATELET - Abnormal; Notable for the following components:   WBC 3.1 (*)    RBC 3.82 (*)    Hemoglobin 10.4 (*)    HCT 33.9 (*)    RDW 18.1 (*)    Lymphs Abs 0.6 (*)    All other components within normal limits    EKG None  Radiology CT Hip Left Wo Contrast  Result Date: 04/29/2023 CLINICAL DATA:  Hip pain. Stress fracture suspected. Negative x-ray. History of bone cancer. Stage IV bone cancer. Left hip and leg pain. Metastatic throat cancer with bone metastases. EXAM: CT OF THE LEFT HIP WITHOUT CONTRAST TECHNIQUE: Multidetector CT imaging of the left hip was performed according to the standard protocol. Multiplanar CT image reconstructions were also generated. RADIATION DOSE REDUCTION: This exam was performed according to the departmental dose-optimization program which includes automated exposure control, adjustment of the mA and/or kV according to patient size and/or use of iterative reconstruction technique. COMPARISON:  Pelvis and left hip radiographs 04/29/2023, CT abdomen pelvis 03/01/2023; PET-CT 05/21/2022 FINDINGS: Bones/Joint/Cartilage Mottled lucency and fragmentation of destructive lesion the far anterior aspect of the left iliac wing (axial series 2 images 41 through 85, coronal series 6 images 68 through 84)) appears not significantly changed from 03/01/2023. Smaller mottled lucency destructive lesion within the posterior inferior left acetabulum (axial series 2, images 205 through 215) is also unchanged. Mottled lucent lesion extending through the anterior cortex of the inferior ischium is also unchanged from 03/01/2023. More anterior, mid AP dimension of the left inferior pubic ramus small lateral lucent lesion with moderate surrounding healing callus formation (axial series 2 images 29 through 327) is not significantly changed. There is partial visualization of the destructive lesion  previously seen at the S3 and S4 levels of the mid aspect of the sacrum (sagittal series 7 images 114 through 120 of 120, appearing not significantly changed from prior. No acute fracture is seen. Ligaments Suboptimally assessed by CT. Muscles and Tendons Normal size and density of the regional left hip musculature. Soft tissues There are scattered dense basilar calcific versus postsurgical metallic densities in the region of the presacral soft tissues, in the region of the hypermetabolic mass seen on prior 05/21/2022 PET-CT. No definite left hip joint effusion. IMPRESSION: Redemonstration of mottled lucent destructive lytic lesions within the posteroinferior left acetabulum, left ischium, and left inferior pubic ramus, and partially visualized within the mid S3 and S4 levels of the sacrum. These are again consistent with bone metastases and not significant changed from prior 03/01/2023 CT. No definite associated acute  pathologic fracture is seen. Electronically Signed   By: Neita Garnet M.D.   On: 04/29/2023 14:10   DG Hip Unilat W or Wo Pelvis 2-3 Views Left  Result Date: 04/29/2023 CLINICAL DATA:  Stage IV bone cancer. Left hip and leg pain for a month EXAM: DG HIP (WITH OR WITHOUT PELVIS) 3V LEFT COMPARISON:  CT 03/01/2023 FINDINGS: Right hip arthroplasty identified with Press-Fit components. Incompletely included in the imaging field. Preserved bone mineralization. The lucent mottled lesion along the left inferior pubic ramus is again identified with what may be a subtle pathologic fracture on one view. This has been nondisplaced. Additional focus along the left iliac bone medial and just above the left sacroiliac joint. Preserved pubic symphysis and sacroiliac joints. Vascular calcifications are seen. Surgical clips. IMPRESSION: Lytic lesions identified once again such as left inferior pubic ramus and left iliac bone as on prior CT scan. Subtle contour deformity of the left inferior pubic ramus. A subtle  fracture is not excluded which would be nondisplaced. Right hip hemiarthroplasty. Electronically Signed   By: Karen Kays M.D.   On: 04/29/2023 11:30    Procedures Procedures    Medications Ordered in ED Medications  ondansetron (ZOFRAN) injection 4 mg (4 mg Intravenous Not Given 04/29/23 1343)  morphine (PF) 4 MG/ML injection 4 mg (4 mg Intravenous Given 04/29/23 1135)  HYDROmorphone (DILAUDID) injection 1 mg (1 mg Intravenous Given 04/29/23 1206)  HYDROmorphone (DILAUDID) injection 2 mg (2 mg Intravenous Given 04/29/23 1243)  morphine (PF) 4 MG/ML injection 4 mg (4 mg Intravenous Given 04/29/23 1343)  fentaNYL (SUBLIMAZE) injection 100 mcg (100 mcg Intravenous Given 04/29/23 1507)  HYDROmorphone (DILAUDID) injection 2 mg (2 mg Intravenous Given 04/29/23 1627)    ED Course/ Medical Decision Making/ A&P                             Medical Decision Making Amount and/or Complexity of Data Reviewed Labs: ordered. Radiology: ordered.  Risk Prescription drug management.   This patient presents to the ED with chief complaint(s) of severe left hip pain with pertinent past medical history of metastatic squamous cell carcinoma to bone.  The complaint involves an extensive differential diagnosis and also carries with it a high risk of complications and morbidity.    The differential diagnosis includes pathological fracture, worsening metastatic disease, cancer related pain  The initial plan is to provide pain relief, obtain x-ray of left hip  Additional history obtained: Records reviewed  -reviewed previous CT imaging that patient had done to assess metastatic disease.  Patient CT chest abdomen pelvis on 03/01/2023 demonstrated significant increase in disease.  Patient had multiple hepatic metastases, left-sided mediastinal and hilar pathological nodes, 3 lung metastases, and multiple sclerotic bone lesions to the spine, left shoulder, ribs, and pelvis.  There was an associated pathologic fracture  involving the left humeral head, left lateral rib, and compression deformity of T3 vertebral body.  Initial Assessment:   On exam, patient is moving about the bed and appears to be very uncomfortable.  Patient is moving his lower extremities without difficulty.  He appears to have mildly reduced ROM of the left hip due to pain.  Sensation is grossly intact.  DP pulses are 2+ bilaterally.  There is no obvious gross deformity.  No inversion or eversion of the left foot.  Independent ECG/labs interpretation:  The following labs were independently interpreted:  CBC with mild leukopenia and anemia.  Metabolic panel  with mild hyponatremia, no major electrolyte disturbance, specifically no elevated calcium.  Renal function is normal.  Anion gap is normal.    Independent visualization and interpretation of imaging: I independently visualized the following imaging with scope of interpretation limited to determining acute life threatening conditions related to emergency care: Left hip x-ray with pelvis, which revealed lytic lesions in the inferior pubic ramus and left iliac bone.  Subtle contour deformity of the left inferior pubic ramus.  Subtle fracture cannot be excluded.  Ordered CT to further investigate.  CT of the hip redemonstrated mottled lucent distracted lytic lesions within the posterior inferior left acetabulum, left ischium, and left inferior pubic ramus and partially visualized within the mid S3 and S4 levels of the sacrum.  There has not been significant change from prior CT on 03/01/2023.  No definite associated acute pathological fracture is seen.  Treatment and Reassessment: Patient was given IV morphine without improvement in pain symptoms.  Patient was then given 3 mg total of Dilaudid without improvement in pain symptoms.  Morphine was redosed, patient continues to complain of intractable pain.  Will request hospital admission for intractable pain and give patient fentanyl.  Patient did  not have much improvement of pain with fentanyl, was given another dose of 2 mg Dilaudid prior to transfer.  Consultations Obtained:   I requested consultation with on-call hospitalist and spoke with Dr. Erenest Blank who agreed with hospital admission.   Disposition:   Patient to be transferred via [facility] to [facility] for hospital admission of intractable pain.          Final Clinical Impression(s) / ED Diagnoses Final diagnoses:  Intractable pain  Metastatic squamous cell carcinoma to bone [facility])    Rx / DC Orders ED Discharge Orders     None         Lenard Simmer, PA-C 04/29/23 1730    Sloan Leiter, DO 04/30/23 0747    Sloan Leiter, DO 04/30/23 [phone]

## 2023-04-29 NOTE — ED Notes (Signed)
Attempted to call report, IP RN busy and will call back

## 2023-04-29 NOTE — Assessment & Plan Note (Signed)
Patient with anemia.  In the setting of patient receiving chemotherapy.  I will do a limited anemia workup.

## 2023-04-29 NOTE — Assessment & Plan Note (Signed)
Treat with Relistor, p.o. MiraLAX and rectal enema as needed.

## 2023-04-29 NOTE — H&P (Addendum)
History and Physical    Patient: Justin Baldwin VWU:[id number] DOB: [birth year] DOA: 04/29/2023 DOS: the patient was seen and examined on 04/29/2023 PCP: Doreene Nest, NP  Patient coming from: Home  Chief Complaint:  Chief Complaint  Patient presents with   Hip Pain   HPI: Justin Baldwin is a 47 y.o. male with medical history significant of metastatic cancer of ENT origin.  Patient actually is under treatment at the [facility] health care system, with last chemotherapy being received about 3 weeks ago.  Patient reports an insidious onset of left anterior inguinal area pain that has been going on for about over a month.  That is aggravated with prolonged weightbearing.  Or standing.  There is no report of trauma, patient has actually been continued to able to ambulate for most of this time.Marland Kitchen  However over the last 2 days the pain has become such severe that he is not able to walk with it.  There is no report of any fresh trauma, distal function is intact.  Patient initially presented to the [facility], transferred to [facility] for further evaluation  Patient is describing the pain as mostly centered around the left groin, however radiating downwards towards the knee as well as both anteriorly and posteriorly.  Patient is on a high dose of pain medications at home, is also having no bowel movements for a week now. Review of Systems: As mentioned in the history of present illness. All other systems reviewed and are negative. Past Medical History:  Diagnosis Date   Arthritis    Dizziness 11/30/2018   DM (diabetes mellitus) (HCC)    Erectile dysfunction    GERD (gastroesophageal reflux disease)    Insomnia    Skin abnormality    hidradenitis suppurativa    Throat cancer [facility])    Past Surgical History:  Procedure Laterality Date   BONE BIOPSY Right 06/02/2022   Procedure: OPEN BIOPSY RIGHT FEMUR;  Surgeon: Samson Frederic, MD;  Location: [facility] ORS;  Service: Orthopedics;  Laterality: Right;   IR  GASTROSTOMY TUBE MOD SED  11/21/2020   IR GASTROSTOMY TUBE REMOVAL  02/27/2021   IR IMAGING GUIDED PORT INSERTION  11/21/2020   IR REMOVAL TUN ACCESS W/ PORT W/O FL MOD SED  06/05/2021   IRRIGATION AND DEBRIDEMENT ABSCESS N/A 09/22/2018   Procedure: IRRIGATION AND DEBRIDEMENT ABSCESS;  Surgeon: Axel Filler, MD;  Location: [facility];  Service: General;  Laterality: N/A;   TOTAL HIP ARTHROPLASTY Right 06/02/2022   Procedure: TOTAL HIP ARTHROPLASTY ANTERIOR APPROACH;  Surgeon: Samson Frederic, MD;  Location: [facility];  Service: Orthopedics;  Laterality: Right;   Social History:  reports that he quit smoking about 2 years ago. His smoking use included cigarettes. He smoked an average of 1 pack per day. He quit smokeless tobacco use about 7 years ago. He reports current alcohol use of about 5.0 standard drinks of alcohol per week. He reports that he does not use drugs.  Allergies  Allergen Reactions   Prednisone Swelling and Anxiety    Family History  Problem Relation Age of Onset   Heart attack Father    Emphysema Paternal Grandmother    Arthritis Mother     Prior to Admission medications   Medication Sig Start Date End Date Taking? Authorizing Provider  anastrozole (ARIMIDEX) 1 MG tablet Take 1 mg by mouth once a week. 11/02/21   [provider]  apixaban (ELIQUIS) 2.5 MG TABS tablet Take 1 tablet (2.5 mg total) by  mouth 2 (two) times daily. 06/02/22   Swinteck, Arlys John, MD  dexamethasone (DECADRON) 4 MG tablet Take 1 tablet (4 mg total) by mouth daily. 07/19/22   Pickenpack-Cousar, Arty Baumgartner, NP  famotidine (PEPCID) 20 MG tablet TAKE 1 TABLET BY MOUTH ONCE DAILY For heartburn 08/04/21   Doreene Nest, NP  magnesium oxide (MAG-OX) 400 (241.3 Mg) MG tablet Take 1 tablet (400 mg total) by mouth 2 (two) times daily. Patient taking differently: Take 400 mg by mouth daily. 01/13/21   Rachel Moulds, MD  Omega-3 Fatty Acids (FISH OIL PO) Take 1 capsule by mouth daily.    [provider]   omeprazole (PRILOSEC) 20 MG capsule Take 20 mg by mouth daily. 03/18/21   [provider]  ondansetron (ZOFRAN) 4 MG tablet Take 1 tablet (4 mg total) by mouth every 8 (eight) hours as needed for nausea or vomiting. 06/02/22   Swinteck, Arlys John, MD  oxymetazoline (AFRIN) 0.05 % nasal spray Place 1-2 sprays into both nostrils as needed for congestion.     [provider]  polyethylene glycol (MIRALAX) 17 g packet Take 17 g by mouth daily as needed for moderate constipation or severe constipation. 06/02/22   Swinteck, Arlys John, MD  TURMERIC PO Take 1 capsule by mouth daily.    [provider]    Physical Exam: Vitals:   04/29/23 1035 04/29/23 1400 04/29/23 1500 04/29/23 1754  BP: 135/82 139/86  (!) 155/88  Pulse: 79 74  75  Resp: 18 15    Temp: 98.2 F (36.8 C)  98.5 F (36.9 C) 99.5 F (37.5 C)  TempSrc: Oral  Oral Oral  SpO2: 98% 98%  96%  Weight:      Height:       Neural: Patient seems to be in mild to moderate painful distress, no other physiologic distress apparent Respiratory exam: Bilateral intravesicular Cardiovascular exam S1-S2 normal Abdomen soft nontender Extremities warm without edema, distal function intact even on the left inguinal area there is no direct tenderness patient is able to demonstrate antigravity strength and reasonable range of motion is preserved. Data Reviewed:  Labs on Admission:  Results for orders placed or performed during the hospital encounter of 04/29/23 (from the past 24 hour(s))  Basic metabolic panel     Status: Abnormal   Collection Time: 04/29/23 11:36 AM  Result Value Ref Range   Sodium 134 (L) 135 - 145 mmol/L   Potassium 3.9 3.5 - 5.1 mmol/L   Chloride 99 98 - 111 mmol/L   CO2 22 22 - 32 mmol/L   Glucose, Bld 121 (H) 70 - 99 mg/dL   BUN 12 6 - 20 mg/dL   Creatinine, Ser 4.09 0.61 - 1.24 mg/dL   Calcium 9.0 8.9 - 81.1 mg/dL   GFR, Estimated >91 >47 mL/min   Anion gap 13 5 - 15  CBC with Differential     Status:  Abnormal   Collection Time: 04/29/23 11:36 AM  Result Value Ref Range   WBC 3.1 (L) 4.0 - 10.5 K/uL   RBC 3.82 (L) 4.22 - 5.81 MIL/uL   Hemoglobin 10.4 (L) 13.0 - 17.0 g/dL   HCT 82.9 (L) 56.2 - 13.0 %   MCV 88.7 80.0 - 100.0 fL   MCH 27.2 26.0 - 34.0 pg   MCHC 30.7 30.0 - 36.0 g/dL   RDW 86.5 (H) 78.4 - 69.6 %   Platelets 211 150 - 400 K/uL   nRBC 0.0 0.0 - 0.2 %   Neutrophils  Relative % 61 %   Neutro Abs 1.9 1.7 - 7.7 K/uL   Lymphocytes Relative 19 %   Lymphs Abs 0.6 (L) 0.7 - 4.0 K/uL   Monocytes Relative 17 %   Monocytes Absolute 0.5 0.1 - 1.0 K/uL   Eosinophils Relative 2 %   Eosinophils Absolute 0.1 0.0 - 0.5 K/uL   Basophils Relative 1 %   Basophils Absolute 0.0 0.0 - 0.1 K/uL   Immature Granulocytes 0 %   Abs Immature Granulocytes 0.01 0.00 - 0.07 K/uL   03/28/2023 Impression  MRI of the left hip without IV contrast- 1.  Multiple heterogenous lesions scattered throughout the pelvic bones and left proximal femur as detailed above compatible with osseous metastasis. Soft tissue extension of the left ischial and left iliac bone lesions as detailed above. Fat signal intensity and central necrotic area in the left ischium lesion could be related to treatment response. 2.  Pathological nondisplaced fracture left inferior pubic rami.. 3.  Mild left gluteus medius and minimus tendinosis. Basic Metabolic Panel: Recent Labs  Lab 04/29/23 1136  NA 134*  K 3.9  CL 99  CO2 22  GLUCOSE 121*  BUN 12  CREATININE 0.85  CALCIUM 9.0   Liver Function Tests: No results for input(s): "AST", "ALT", "ALKPHOS", "BILITOT", "PROT", "ALBUMIN" in the last 168 hours. No results for input(s): "LIPASE", "AMYLASE" in the last 168 hours. No results for input(s): "AMMONIA" in the last 168 hours. CBC: Recent Labs  Lab 04/29/23 1136  WBC 3.1*  NEUTROABS 1.9  HGB 10.4*  HCT 33.9*  MCV 88.7  PLT 211   Cardiac Enzymes: No results for input(s): "CKTOTAL", "CKMB", "CKMBINDEX", "TROPONINIHS"  in the last 168 hours.  BNP (last 3 results) No results for input(s): "PROBNP" in the last 8760 hours. CBG: No results for input(s): "GLUCAP" in the last 168 hours.  Radiological Exams on Admission:  CT Hip Left Wo Contrast  Result Date: 04/29/2023 CLINICAL DATA:  Hip pain. Stress fracture suspected. Negative x-ray. History of bone cancer. Stage IV bone cancer. Left hip and leg pain. Metastatic throat cancer with bone metastases. EXAM: CT OF THE LEFT HIP WITHOUT CONTRAST TECHNIQUE: Multidetector CT imaging of the left hip was performed according to the standard protocol. Multiplanar CT image reconstructions were also generated. RADIATION DOSE REDUCTION: This exam was performed according to the departmental dose-optimization program which includes automated exposure control, adjustment of the mA and/or kV according to patient size and/or use of iterative reconstruction technique. COMPARISON:  Pelvis and left hip radiographs 04/29/2023, CT abdomen pelvis 03/01/2023; PET-CT 05/21/2022 FINDINGS: Bones/Joint/Cartilage Mottled lucency and fragmentation of destructive lesion the far anterior aspect of the left iliac wing (axial series 2 images 41 through 85, coronal series 6 images 68 through 84)) appears not significantly changed from 03/01/2023. Smaller mottled lucency destructive lesion within the posterior inferior left acetabulum (axial series 2, images 205 through 215) is also unchanged. Mottled lucent lesion extending through the anterior cortex of the inferior ischium is also unchanged from 03/01/2023. More anterior, mid AP dimension of the left inferior pubic ramus small lateral lucent lesion with moderate surrounding healing callus formation (axial series 2 images 29 through 327) is not significantly changed. There is partial visualization of the destructive lesion previously seen at the S3 and S4 levels of the mid aspect of the sacrum (sagittal series 7 images 114 through 120 of 120, appearing not  significantly changed from prior. No acute fracture is seen. Ligaments Suboptimally assessed by CT. Muscles and Tendons  Normal size and density of the regional left hip musculature. Soft tissues There are scattered dense basilar calcific versus postsurgical metallic densities in the region of the presacral soft tissues, in the region of the hypermetabolic mass seen on prior 05/21/2022 PET-CT. No definite left hip joint effusion. IMPRESSION: Redemonstration of mottled lucent destructive lytic lesions within the posteroinferior left acetabulum, left ischium, and left inferior pubic ramus, and partially visualized within the mid S3 and S4 levels of the sacrum. These are again consistent with bone metastases and not significant changed from prior 03/01/2023 CT. No definite associated acute pathologic fracture is seen. Electronically Signed   By: Neita Garnet M.D.   On: 04/29/2023 14:10   DG Hip Unilat W or Wo Pelvis 2-3 Views Left  Result Date: 04/29/2023 CLINICAL DATA:  Stage IV bone cancer. Left hip and leg pain for a month EXAM: DG HIP (WITH OR WITHOUT PELVIS) 3V LEFT COMPARISON:  CT 03/01/2023 FINDINGS: Right hip arthroplasty identified with Press-Fit components. Incompletely included in the imaging field. Preserved bone mineralization. The lucent mottled lesion along the left inferior pubic ramus is again identified with what may be a subtle pathologic fracture on one view. This has been nondisplaced. Additional focus along the left iliac bone medial and just above the left sacroiliac joint. Preserved pubic symphysis and sacroiliac joints. Vascular calcifications are seen. Surgical clips. IMPRESSION: Lytic lesions identified once again such as left inferior pubic ramus and left iliac bone as on prior CT scan. Subtle contour deformity of the left inferior pubic ramus. A subtle fracture is not excluded which would be nondisplaced. Right hip hemiarthroplasty. Electronically Signed   By: Karen Kays M.D.   On:  04/29/2023 11:30       Assessment and Plan: Leukopenia Patient with anemia.  In the setting of patient receiving chemotherapy.  I will do a limited anemia workup.  Hip pain On the left side, that has been going on for at least a month, review of record indicates that the patient had an MRI done on March 28, 2023 with finding of pathological fracture of the left inferior pubic rami.  This is not again demonstrated today on the CAT scan.  I believe that the patient may have more fractures or possibly referred pain from either the knee or the lower back that is causing his ongoing pain.  Unfortunately patient is having such severe pain at this time that he cannot tolerate an MRI.  Patient reports having an excellent clinical response to fentanyl that was given in the ER, reports having rather poor response to Dilaudid.  Therefore I will start the patient on fentanyl patch 75 mcg/h.  With Dilaudid and fentanyl ordered as needed for breakthrough pain.  Once the patient's pain is controlled I think we can pursue more investigations namely MRI of the pelvis and MRI of the lumbar spine to figure out.  In the interim I will order an x-ray of the left knee to make sure were not missing referred pain from there.  Drug induced constipation Treat with Relistor, p.o. MiraLAX and rectal enema as needed.   Patient told me his outpatinet meds as follows:  Current Outpatient Medications Medication Sig Dispense Refill anastrozole (ARIMIDEX) 1 mg tablet - not anymore calcium carbonate (OS-CAL) 500 mg calcium (1,250 mg) chewable tablet Chew 1 tablet (500 mg elem calcium total) daily. 42 tablet 0 cholecalciferol, vitamin D3-50 mcg, 2,000 unit,, (VITAMIN D3-50 MCG, 2,000 UNIT,) 50 mcg (2,000 unit) tablet Take 1 tablet (50 mcg  total) by mouth daily. 42 tablet 0 famotidine (PEPCID) 20 MG tablet Take 1 tablet (20 mg total) by mouth two (2) times a day as needed for heartburn. 60 tablet 2 Stopped : fluticasone propionate  (FLONASE) 50 mcg/actuation nasal spray 2 sprays into each nostril. HYDROmorphone (DILAUDID) 8 MG tablet Take 1.5 tablets (12 mg total) by mouth every three (3) hours as needed for pain. 168 tablet 0 lactulose 10 gram/15 mL (15 mL) Soln Take 15-30 mL (10-20 g total) by mouth Three (3) times a day as needed (constipation). 900 mL 3 levothyroxine (SYNTHROID) 25 MCG tablet Take 1 tablet (25 mcg total) by mouth daily. 90 tablet 3 methadone (DOLOPHINE) 10 MG tablet Take 1.5 tablets (15 mg total) by mouth every morning AND 1 tablet (10 mg total) daily after lunch AND 1 tablet (10 mg total) every evening. 98 tablet 0 naldemedine 0.2 mg Tab Take 0.2 mg by mouth daily. 30 tablet 2 naloxone (NARCAN) 4 mg nasal spray One spray in either nostril once for known/suspected opioid overdose. May repeat every 2-3 minutes in alternating nostril til EMS arrives 1 each 0 omeprazole (PRILOSEC) 20 MG capsule Take 1 capsule (20 mg total) by mouth daily. 90 capsule 1 Not taking : ondansetron (ZOFRAN) 8 MG tablet Take 1 tablet (8 mg total) by mouth every eight (8) hours as needed for nausea (or vomiting). 60 tablet 3 Stopped: oxymetazoline (NO DRIP) 0.05 % nasal spray into each nostril. polyethylene glycol (MIRALAX) 17 gram packet Take 17 g by mouth Three (3) times a day as needed. 100 packet 5 pregabalin (LYRICA) 150 MG capsule Take 1 capsule (150 mg total) by mouth Three (3) times a day. 90 capsule 1 Stopped : prochlorperazine (COMPAZINE) 10 MG tablet Take 1 tablet (10 mg total) by mouth every six (6) hours as needed (Nausea/Vomiting). 30 tablet 2 Stopped: prochlorperazine (COMPAZINE) 10 MG tablet Take 1 tablet (10 mg total) by mouth every six (6) hours as needed for nausea. 90 tablet 2 SENNA 8.6 mg tablet Take 2 tablets by mouth nightly. 60 tablet 11 sildenafil, antihypertensive, (REVATIO) 20 mg tablet Take 2-5 tablets by mouth 1-2 hours prior to sexual relations. 50 tablet 11 tadalafiL (CIALIS) 5 MG tablet TAKE ONE TABLET  BY MOUTH DAILY AS NEEDED FOR ERECTILE DYSFUNCTION tamsulosin (FLOMAX) 0.4 mg capsule Take 1 capsule (0.4 mg total) by mouth daily. 90 capsule 3 traZODone (DESYREL) 50 MG tablet Take 1/2 to 1 tablet at nighttime as needed for insomnia. 90 tablet 3  Med rec done accordingly. I have continued both methadone (from home) and fentanyl (started here in the hospital) as patient was already on methadone , still tolerated fentanyl in the ER pretty well. Also, he is having better control with fenatnyl than with any other pain medication. I will get palliative care evaluation to help [place] this regard.   Advance Care Planning:   Code Status: Full Code   Consults: none  Family Communication: lady family member at bedside.  Severity of Illness: The appropriate patient status for this patient is OBSERVATION. Observation status is judged to be reasonable and necessary in order to provide the required intensity of service to ensure the patient's safety. The patient's presenting symptoms, physical exam findings, and initial radiographic and laboratory data in the context of their medical condition is felt to place them at decreased risk for further clinical deterioration. Furthermore, it is anticipated that the patient will be medically stable for discharge from the hospital within 2 midnights of admission.  Author: Nolberto Hanlon, MD 04/29/2023 7:21 PM  For on call review [email or web address].

## 2023-04-30 ENCOUNTER — Observation Stay (HOSPITAL_COMMUNITY): Payer: BC Managed Care – PPO

## 2023-04-30 DIAGNOSIS — R52 Pain, unspecified: Secondary | ICD-10-CM | POA: Diagnosis not present

## 2023-04-30 DIAGNOSIS — E538 Deficiency of other specified B group vitamins: Secondary | ICD-10-CM | POA: Diagnosis present

## 2023-04-30 LAB — PROTIME-INR
INR: 1 (ref 0.8–1.2)
Prothrombin Time: 13.6 seconds (ref 11.4–15.2)

## 2023-04-30 LAB — FERRITIN: Ferritin: 132 ng/mL (ref 24–336)

## 2023-04-30 LAB — IRON AND TIBC
Iron: 22 ug/dL — ABNORMAL LOW (ref 45–182)
Saturation Ratios: 10 % — ABNORMAL LOW (ref 17.9–39.5)
TIBC: 220 ug/dL — ABNORMAL LOW (ref 250–450)
UIBC: 198 ug/dL

## 2023-04-30 LAB — VITAMIN B12: Vitamin B-12: 127 pg/mL — ABNORMAL LOW (ref 180–914)

## 2023-04-30 LAB — FOLATE: Folate: 4.5 ng/mL — ABNORMAL LOW (ref >5.9)

## 2023-04-30 LAB — APTT: aPTT: 28 seconds (ref 24–36)

## 2023-04-30 LAB — RETICULOCYTES
Immature Retic Fract: 21.7 % — ABNORMAL HIGH (ref 2.3–15.9)
RBC.: 3.6 MIL/uL — ABNORMAL LOW (ref 4.22–5.81)
Retic Count, Absolute: 99.4 10*3/uL (ref 19.0–186.0)
Retic Ct Pct: 2.8 % (ref 0.4–3.1)

## 2023-04-30 LAB — GLUCOSE, CAPILLARY: Glucose-Capillary: 135 mg/dL — ABNORMAL HIGH (ref 70–99)

## 2023-04-30 LAB — HIV ANTIBODY (ROUTINE TESTING W REFLEX): HIV Screen 4th Generation wRfx: NONREACTIVE

## 2023-04-30 MED ORDER — FOLIC ACID 1 MG PO TABS: 1.0000 mg | ORAL_TABLET | Freq: Every day | ORAL | 1 refills | Status: DC

## 2023-04-30 MED ORDER — HEPARIN SOD (PORK) LOCK FLUSH 100 UNIT/ML IV SOLN
500.0000 [IU] | INTRAVENOUS | Status: AC | PRN
  Administered 2023-04-30: 500 [IU]

## 2023-04-30 MED ORDER — PREGABALIN 150 MG PO CAPS: 150.0000 mg | ORAL_CAPSULE | Freq: Three times a day (TID) | ORAL | 1 refills | Status: DC

## 2023-04-30 MED ORDER — FENTANYL 75 MCG/HR TD PT72: 1.0000 | MEDICATED_PATCH | TRANSDERMAL | 0 refills | Status: DC

## 2023-04-30 MED ORDER — VITAMIN B-12 1000 MCG PO TABS
1000.0000 ug | ORAL_TABLET | Freq: Every day | ORAL | Status: DC
  Administered 2023-04-30: 1000 ug via ORAL
  Filled 2023-04-30: qty 1

## 2023-04-30 MED ORDER — FOLIC ACID 1 MG PO TABS
1.0000 mg | ORAL_TABLET | Freq: Every day | ORAL | Status: DC
  Administered 2023-04-30: 1 mg via ORAL
  Filled 2023-04-30: qty 1

## 2023-04-30 MED ORDER — SODIUM CHLORIDE 0.9% FLUSH: 10.0000 mL | INTRAVENOUS | Status: DC | PRN

## 2023-04-30 MED ORDER — CYANOCOBALAMIN 1000 MCG PO TABS: 1000.0000 ug | ORAL_TABLET | Freq: Every day | ORAL | 1 refills | Status: DC

## 2023-04-30 MED ORDER — FLEET ENEMA 7-19 GM/118ML RE ENEM
1.0000 | ENEMA | Freq: Every day | RECTAL | Status: DC | PRN
  Filled 2023-04-30: qty 1

## 2023-04-30 NOTE — Discharge Summary (Signed)
Physician Discharge Summary   Patient: Justin Baldwin MRN: [id number] DOB: [birth year]  Admit date:     04/29/2023  Discharge date: {dischdate:26783}  Discharge Physician: Reva Bores   PCP: Doreene Nest, NP   Recommendations at discharge:  {Tip this will not be part of the note when signed- Example include specific recommendations for outpatient follow-up, pending tests to follow-up on. (Optional):26781}  ***  Discharge Diagnoses: Principal Problem:   Intractable pain Active Problems:   Cancer of tonsillar fossa (HCC)   Drug induced constipation   Hip pain   Leukopenia   B12 deficiency   Folate deficiency  Resolved Problems:   * No resolved hospital problems. *  Hospital Course: Justin Baldwin is a 47 y.o. male with medical history significant of metastatic cancer of ENT origin.  Patient actually is under treatment at the [facility], with last chemotherapy being received about 3 weeks ago.  Patient reports an insidious onset of left anterior inguinal area pain that has been going on for about over a month.  That is aggravated with prolonged weightbearing.  Or standing.  There is no report of trauma, patient has actually been continued to able to ambulate for most of this time.Marland Kitchen  However over the last 2 days the pain has become such severe that he is not able to walk with it.  There is no report of any fresh trauma, distal function is intact.    Patient is describing the pain as mostly centered around the left groin, however radiating downwards towards the knee as well as both anteriorly and posteriorly.   Patient is on a high dose of pain medications at home, is also having no bowel movements for a week now.  Assessment and Plan: Folate deficiency Begin folate repletion  B12 deficiency Begin oral repletion  Leukopenia Patient with anemia.  In the setting of patient receiving chemotherapy.  I will do a limited anemia workup.  Hip pain On the left side, that  has been going on for at least a month, review of record indicates that the patient had an MRI done on March 28, 2023 with finding of pathological fracture of the left inferior pubic rami.     Started on fentanyl patch which seems to gain control of his pain. Now that patient's pain is controlled, we can pursue more investigations namely MRI of the pelvis and MRI of the lumbar spine to figure out.  Ordered today.  Once this is known potential for discharge later in the day with this new patch if needed.  Drug induced constipation Treat with Relistor, p.o. MiraLAX and rectal enema as needed.  Cancer of tonsillar fossa (HCC) Metastatic at present with multiple bone lesions which are certainly contributing to his pain. He is getting care through [facility] in coordination with MD Dareen Piano.      {Tip this will not be part of the note when signed Body mass index is 26.04 kg/m. , ,  (Optional):26781}  {(NOTE) Pain control PDMP Statment (Optional):26782} Consultants: *** Procedures performed: ***  Disposition: {Plan; Disposition:26390} Diet recommendation:  Discharge Diet Orders (From admission, onward)     Start     Ordered   04/30/23 0000  Diet general        04/30/23 1246           {Diet_Plan:26776} DISCHARGE MEDICATION: Allergies as of 04/30/2023       Reactions   Prednisone Swelling, Anxiety  Medication List     STOP taking these medications    dexamethasone 4 MG tablet Commonly known as: DECADRON       TAKE these medications    Acetaminophen Extra Strength 500 MG Tabs Take 2 tablets by mouth every 8 (eight) hours.   anastrozole 1 MG tablet Commonly known as: ARIMIDEX Take 1 mg by mouth once a week.   apixaban 2.5 MG Tabs tablet Commonly known as: ELIQUIS Take 1 tablet (2.5 mg total) by mouth 2 (two) times daily.   calcium carbonate 500 MG chewable tablet Commonly known as: TUMS - dosed in mg elemental calcium Chew 1 tablet by mouth daily.    cyanocobalamin 1000 MCG tablet Take 1 tablet (1,000 mcg total) by mouth daily. Start taking on: May 01, 2023   famotidine 20 MG tablet Commonly known as: PEPCID TAKE 1 TABLET BY MOUTH ONCE DAILY For heartburn   fentaNYL 75 MCG/HR Commonly known as: DURAGESIC Place 1 patch onto the skin every 3 (three) days. Start taking on: May 02, 2023   FISH OIL PO Take 1 capsule by mouth daily.   folic acid 1 MG tablet Commonly known as: FOLVITE Take 1 tablet (1 mg total) by mouth daily. Start taking on: May 01, 2023   [facility] Vitamin D Maximum Strength 50 MCG (2000 UT) Tabs Generic drug: Cholecalciferol Take 1 tablet by mouth daily.   HYDROmorphone 8 MG tablet Commonly known as: DILAUDID Take 12 mg by mouth See admin instructions. By mouth every 3 hours as needed for pain.   lactulose 10 GM/15ML solution Commonly known as: CHRONULAC Take 10-20 g by mouth 3 (three) times daily as needed for moderate constipation, severe constipation or mild constipation.   levothyroxine 25 MCG tablet Commonly known as: SYNTHROID Take 25 mcg by mouth daily before breakfast.   magnesium oxide 400 (241.3 Mg) MG tablet Commonly known as: MAG-OX Take 1 tablet (400 mg total) by mouth 2 (two) times daily. What changed: when to take this   methadone 10 MG tablet Commonly known as: DOLOPHINE Take 10-15 mg by mouth See admin instructions. Take 1.5 tablets (15 mg total) by mouth every morning AND 1 tablet (10 mg total) daily after lunch AND 1 tablet (10 mg total) every evening.   omeprazole 20 MG capsule Commonly known as: PRILOSEC Take 20 mg by mouth daily.   ondansetron 4 MG tablet Commonly known as: Zofran Take 1 tablet (4 mg total) by mouth every 8 (eight) hours as needed for nausea or vomiting.   ondansetron 8 MG tablet Commonly known as: ZOFRAN Take 8 mg by mouth every 8 (eight) hours as needed for nausea or vomiting.   oxymetazoline 0.05 % nasal spray Commonly known as: AFRIN Place 1-2 sprays  into both nostrils as needed for congestion.   polyethylene glycol 17 g packet Commonly known as: MiraLax Take 17 g by mouth daily as needed for moderate constipation or severe constipation.   pregabalin 150 MG capsule Commonly known as: LYRICA Take 1 capsule (150 mg total) by mouth 3 (three) times daily.   tamsulosin 0.4 MG Caps capsule Commonly known as: FLOMAX Take 0.4 mg by mouth daily.   traZODone 50 MG tablet Commonly known as: DESYREL Take 25 mg by mouth at bedtime as needed (insomnia).   TURMERIC PO Take 1 capsule by mouth daily.        Discharge Exam: Filed Weights   04/29/23 1030  Weight: 87.1 kg   ***  Condition at discharge: {DC Condition:26389}  The results of significant diagnostics from this hospitalization (including imaging, microbiology, ancillary and laboratory) are listed below for reference.   Imaging Studies: CT Hip Left Wo Contrast  Result Date: 04/29/2023 CLINICAL DATA:  Hip pain. Stress fracture suspected. Negative x-ray. History of bone cancer. Stage IV bone cancer. Left hip and leg pain. Metastatic throat cancer with bone metastases. EXAM: CT OF THE LEFT HIP WITHOUT CONTRAST TECHNIQUE: Multidetector CT imaging of the left hip was performed according to the standard protocol. Multiplanar CT image reconstructions were also generated. RADIATION DOSE REDUCTION: This exam was performed according to the departmental dose-optimization program which includes automated exposure control, adjustment of the mA and/or kV according to patient size and/or use of iterative reconstruction technique. COMPARISON:  Pelvis and left hip radiographs 04/29/2023, CT abdomen pelvis 03/01/2023; PET-CT 05/21/2022 FINDINGS: Bones/Joint/Cartilage Mottled lucency and fragmentation of destructive lesion the far anterior aspect of the left iliac wing (axial series 2 images 41 through 85, coronal series 6 images 68 through 84)) appears not significantly changed from 03/01/2023. Smaller  mottled lucency destructive lesion within the posterior inferior left acetabulum (axial series 2, images 205 through 215) is also unchanged. Mottled lucent lesion extending through the anterior cortex of the inferior ischium is also unchanged from 03/01/2023. More anterior, mid AP dimension of the left inferior pubic ramus small lateral lucent lesion with moderate surrounding healing callus formation (axial series 2 images 29 through 327) is not significantly changed. There is partial visualization of the destructive lesion previously seen at the S3 and S4 levels of the mid aspect of the sacrum (sagittal series 7 images 114 through 120 of 120, appearing not significantly changed from prior. No acute fracture is seen. Ligaments Suboptimally assessed by CT. Muscles and Tendons Normal size and density of the regional left hip musculature. Soft tissues There are scattered dense basilar calcific versus postsurgical metallic densities in the region of the presacral soft tissues, in the region of the hypermetabolic mass seen on prior 05/21/2022 PET-CT. No definite left hip joint effusion. IMPRESSION: Redemonstration of mottled lucent destructive lytic lesions within the posteroinferior left acetabulum, left ischium, and left inferior pubic ramus, and partially visualized within the mid S3 and S4 levels of the sacrum. These are again consistent with bone metastases and not significant changed from prior 03/01/2023 CT. No definite associated acute pathologic fracture is seen. Electronically Signed   By: Neita Garnet M.D.   On: 04/29/2023 14:10   DG Hip Unilat W or Wo Pelvis 2-3 Views Left  Result Date: 04/29/2023 CLINICAL DATA:  Stage IV bone cancer. Left hip and leg pain for a month EXAM: DG HIP (WITH OR WITHOUT PELVIS) 3V LEFT COMPARISON:  CT 03/01/2023 FINDINGS: Right hip arthroplasty identified with Press-Fit components. Incompletely included in the imaging field. Preserved bone mineralization. The lucent mottled  lesion along the left inferior pubic ramus is again identified with what may be a subtle pathologic fracture on one view. This has been nondisplaced. Additional focus along the left iliac bone medial and just above the left sacroiliac joint. Preserved pubic symphysis and sacroiliac joints. Vascular calcifications are seen. Surgical clips. IMPRESSION: Lytic lesions identified once again such as left inferior pubic ramus and left iliac bone as on prior CT scan. Subtle contour deformity of the left inferior pubic ramus. A subtle fracture is not excluded which would be nondisplaced. Right hip hemiarthroplasty. Electronically Signed   By: Karen Kays M.D.   On: 04/29/2023 11:30    Microbiology: Results for orders placed or  performed during the hospital encounter of 02/03/23  Urine Culture (for pregnant, neutropenic or urologic patients or patients with an indwelling urinary catheter)     Status: None   Collection Time: 02/04/23  2:00 AM   Specimen: Urine, Clean Catch  Result Value Ref Range Status   Specimen Description   Final    URINE, CLEAN CATCH Performed at [facility], [address], [place], [place] [address]    Special Requests   Final    NONE Performed at [facility], [address] [place], [place] [address]    Culture   Final    NO GROWTH Performed at [facility], [address], [place], [place] [address]    Report Status 02/05/2023 FINAL  Final    Labs: CBC: Recent Labs  Lab 04/29/23 1136  WBC 3.1*  NEUTROABS 1.9  HGB 10.4*  HCT 33.9*  MCV 88.7  PLT 211   Basic Metabolic Panel: Recent Labs  Lab 04/29/23 1136  NA 134*  K 3.9  CL 99  CO2 22  GLUCOSE 121*  BUN 12  CREATININE 0.85  CALCIUM 9.0   Liver Function Tests: No results for input(s): "AST", "ALT", "ALKPHOS", "BILITOT", "PROT", "ALBUMIN" in the last 168 hours. CBG: Recent Labs  Lab 04/30/23 0101  GLUCAP 135*    Discharge time spent: {LESS  THAN/GREATER THAN:26388} 30 minutes.  Signed: Reva Bores, MD [facility] 04/30/2023

## 2023-04-30 NOTE — Progress Notes (Signed)
Per RN. Pt refusing MRI at this time.

## 2023-04-30 NOTE — Hospital Course (Signed)
MORTIMER PARMA is a 47 y.o. male with medical history significant of metastatic cancer of ENT origin.  Patient actually is under treatment at the [facility] health care system, with last chemotherapy being received about 3 weeks ago.  Patient reports an insidious onset of left anterior inguinal area pain that has been going on for about over a month.  That is aggravated with prolonged weightbearing.  Or standing.  There is no report of trauma, patient has actually been continued to able to ambulate for most of this time.Marland Kitchen  However over the last 2 days the pain has become such severe that he is not able to walk with it.  There is no report of any fresh trauma, distal function is intact.    Patient is describing the pain as mostly centered around the left groin, however radiating downwards towards the knee as well as both anteriorly and posteriorly.   Patient is on a high dose of pain medications at home, is also having no bowel movements for a week now.

## 2023-04-30 NOTE — Consult Note (Signed)
  Daily Progress Note   Patient Name: Justin Baldwin       Date: 04/30/2023 DOB: 1976/04/26  Age: 47 y.o. MRN#: [id number] Attending Physician: Reva Bores, MD Primary Care Physician: Doreene Nest, NP Admit Date: 04/29/2023 Length of Stay: 0 days  Discussed care with primary hospitalist today. Patient is followed by [facility] palliative medicine who manages his symptom burden. Currently pain improved and patient may be discharged later today.After discussion with hospitalist, will discontinue inpatient consult at this time. Please reach out if PMT can be of assistance in the future. Thank you.   Alvester Morin, DO Palliative Care Provider PMT # [phone]

## 2023-04-30 NOTE — Assessment & Plan Note (Signed)
Metastatic at present with multiple bone lesions which are certainly contributing to his pain. He is getting care through [facility] in coordination with MD Dareen Piano.

## 2023-04-30 NOTE — Progress Notes (Signed)
Progress Note   Patient: Justin Baldwin:[id number] DOB: May 10, 1976 DOA: 04/29/2023     0 DOS: the patient was seen and examined on 04/30/2023   Brief hospital course: Justin Baldwin is a 47 y.o. male with medical history significant of metastatic cancer of ENT origin.  Patient actually is under treatment at the [facility] health care system, with last chemotherapy being received about 3 weeks ago.  Patient reports an insidious onset of left anterior inguinal area pain that has been going on for about over a month.  That is aggravated with prolonged weightbearing.  Or standing.  There is no report of trauma, patient has actually been continued to able to ambulate for most of this time.Marland Kitchen  However over the last 2 days the pain has become such severe that he is not able to walk with it.  There is no report of any fresh trauma, distal function is intact.    Patient is describing the pain as mostly centered around the left groin, however radiating downwards towards the knee as well as both anteriorly and posteriorly.   Patient is on a high dose of pain medications at home, is also having no bowel movements for a week now.  Assessment and Plan: Folate deficiency Begin folate repletion  B12 deficiency Begin oral repletion  Leukopenia Patient with anemia.  In the setting of patient receiving chemotherapy.  I will do a limited anemia workup.  Hip pain On the left side, that has been going on for at least a month, review of record indicates that the patient had an MRI done on March 28, 2023 with finding of pathological fracture of the left inferior pubic rami.     Started on fentanyl patch which seems to gain control of his pain. Now that patient's pain is controlled, we can pursue more investigations namely MRI of the pelvis and MRI of the lumbar spine to figure out.  Ordered today.  Once this is known potential for discharge later in the day with this new patch if needed.  Drug induced constipation Treat with  Relistor, p.o. MiraLAX and rectal enema as needed.  Cancer of tonsillar fossa (HCC) Metastatic at present with multiple bone lesions which are certainly contributing to his pain. He is getting care through [facility] in coordination with MD Dareen Piano.    Subjective: Patient reports pain is much better controlled now, although he had a rough night was feeling like he might be out of tolerate MRI at this time.  Physical Exam: Vitals:   04/29/23 1954 04/30/23 0026 04/30/23 0055 04/30/23 0429  BP: (!) 146/85 139/78 (!) 155/81 121/77  Pulse: 74 70 84 (!) 59  Resp: 18 18 19 18   Temp: 99.4 F (37.4 C) 98.2 F (36.8 C) 98 F (36.7 C) 98.2 F (36.8 C)  TempSrc: Oral  Oral Oral  SpO2: 97% 93% 96% 96%  Weight:      Height:       Physical Examination: General appearance - alert, well appearing, and in no distress Chest -normal effort Heart - normal rate and regular rhythm Abdomen - soft, nontender, nondistended, no masses or organomegaly  Data Reviewed: Results for orders placed or performed during the hospital encounter of 04/29/23 (from the past 24 hour(s))  Glucose, capillary     Status: Abnormal   Collection Time: 04/30/23  1:01 AM  Result Value Ref Range   Glucose-Capillary 135 (H) 70 - 99 mg/dL  Vitamin N56     Status: Abnormal  Collection Time: 04/30/23  5:18 AM  Result Value Ref Range   Vitamin B-12 127 (L) 180 - 914 pg/mL  Folate     Status: Abnormal   Collection Time: 04/30/23  5:18 AM  Result Value Ref Range   Folate 4.5 (L) >5.9 ng/mL  Iron and TIBC     Status: Abnormal   Collection Time: 04/30/23  5:18 AM  Result Value Ref Range   Iron 22 (L) 45 - 182 ug/dL   TIBC 161 (L) 096 - 045 ug/dL   Saturation Ratios 10 (L) 17.9 - 39.5 %   UIBC 198 ug/dL  Ferritin     Status: None   Collection Time: 04/30/23  5:18 AM  Result Value Ref Range   Ferritin 132 24 - 336 ng/mL  Reticulocytes     Status: Abnormal   Collection Time: 04/30/23  5:18 AM  Result Value Ref Range   Retic  Ct Pct 2.8 0.4 - 3.1 %   RBC. 3.60 (L) 4.22 - 5.81 MIL/uL   Retic Count, Absolute 99.4 19.0 - 186.0 K/uL   Immature Retic Fract 21.7 (H) 2.3 - 15.9 %  APTT     Status: None   Collection Time: 04/30/23  5:18 AM  Result Value Ref Range   aPTT 28 24 - 36 seconds  Protime-INR     Status: None   Collection Time: 04/30/23  5:18 AM  Result Value Ref Range   Prothrombin Time 13.6 11.4 - 15.2 seconds   INR 1.0 0.8 - 1.2   Report  Family Communication: Male friend at bedside  Disposition: Status is: Observation The patient remains OBS appropriate and will d/c before 2 midnights.  Planned Discharge Destination: Home  Time spent: 41 minutes  Author: Reva Bores, MD 04/30/2023 12:37 PM  For on call review [email or web address].

## 2023-04-30 NOTE — Assessment & Plan Note (Signed)
Begin oral repletion

## 2023-04-30 NOTE — Progress Notes (Signed)
Pt reports sudden onset "nervousness", diaphoresis, and overall "don't feel right", resolving within a few minutes of assessment by this nurse. Vital signs- T-98 HR-84 R-19 BP 155/81 O2 sat 96% on room air. No changes noted on telemetry monitor. Pt has been receiving pain medication around the clock for left leg pain, with minimal effects noted. Pt admitted with intractable pain to left hip/leg. Under chemo for throat cancer with mets to bone. Pt receiving dilaudid, fentanyl and methadone for pain management. Pt has been requesting pain medication approximately every hour since admission this evening.  Marja Kays made aware.

## 2023-04-30 NOTE — Assessment & Plan Note (Signed)
Begin folate repletion

## 2023-05-01 ENCOUNTER — Encounter: Payer: Self-pay | Admitting: Hematology and Oncology

## 2023-05-02 ENCOUNTER — Telehealth: Payer: Self-pay

## 2023-05-02 NOTE — Telephone Encounter (Signed)
I haven't seen patient in almost 2 years.   I see that he's reached out to his oncologist, Dr. Al Pimple. He may want to contact whoever he's seeing through [facility], maybe they can help?

## 2023-05-02 NOTE — Transitions of Care (Post Inpatient/ED Visit) (Unsigned)
   05/02/2023  Name: KELLIS BEARMAN MRN: [id number] DOB: 24-Sep-1976  Today's TOC FU Call Status: Today's TOC FU Call Status:: Unsuccessul Call (1st Attempt) Unsuccessful Call (1st Attempt) Date: 05/02/23  Attempted to reach the patient regarding the most recent Inpatient/ED visit.  Follow Up Plan: Additional outreach attempts will be made to reach the patient to complete the Transitions of Care (Post Inpatient/ED visit) call.   Signature   Woodfin Ganja LPN [facility] Nurse Health Advisor Direct Dial [phone]

## 2023-05-03 NOTE — Transitions of Care (Post Inpatient/ED Visit) (Signed)
05/03/2023  Name: Justin Baldwin MRN: [id number] DOB: [birth year]  Today's TOC FU Call Status: Today's TOC FU Call Status:: Successful TOC FU Call Competed Unsuccessful Call (1st Attempt) Date: 05/02/23 [facility] FU Call Complete Date: 05/03/23  Transition Care Management Follow-up Telephone Call Date of Discharge: 04/30/23 Discharge Facility: [facility]) Type of Discharge: Inpatient Admission Primary Inpatient Discharge Diagnosis:: Intractable pain How have you been since you were released from the hospital?: Better Any questions or concerns?: No  Items Reviewed: Did you receive and understand the discharge instructions provided?: Yes Medications obtained,verified, and reconciled?: Yes (Medications Reviewed) Any new allergies since your discharge?: No Dietary orders reviewed?: Yes Do you have support at home?: Yes  Medications Reviewed Today: Medications Reviewed Today     Reviewed by Merleen Nicely, LPN (Licensed Practical Nurse) on 05/03/23 at 1007  Med List Status: <None>   Medication Order Taking? Sig Documenting Provider Last Dose Status Informant  Acetaminophen Extra Strength 500 MG TABS [id number] Yes Take 2 tablets by mouth every 8 (eight) hours. [provider] Taking Active   anastrozole (ARIMIDEX) 1 MG tablet [id number] Yes Take 1 mg by mouth once a week. [provider] Taking Active Self  apixaban (ELIQUIS) 2.5 MG TABS tablet [id number] Yes Take 1 tablet (2.5 mg total) by mouth 2 (two) times daily. Swinteck, Arlys John, MD Taking Active   calcium carbonate (TUMS - DOSED IN MG ELEMENTAL CALCIUM) 500 MG chewable tablet [id number] Yes Chew 1 tablet by mouth daily. [provider] Taking Active   cyanocobalamin 1000 MCG tablet [id number] Yes Take 1 tablet (1,000 mcg total) by mouth daily. Reva Bores, MD Taking Active   famotidine (PEPCID) 20 MG tablet [id number] Yes TAKE 1 TABLET BY MOUTH ONCE DAILY For heartburn Doreene Nest, NP Taking Active  Self  fentaNYL (DURAGESIC) 75 MCG/HR [id number] Yes Place 1 patch onto the skin every 3 (three) days. Reva Bores, MD Taking Active   folic acid (FOLVITE) 1 MG tablet [id number] Yes Take 1 tablet (1 mg total) by mouth daily. Reva Bores, MD Taking Active   [facility] VITAMIN D MAXIMUM STRENGTH 50 MCG (2000 UT) TABS [id number] Yes Take 1 tablet by mouth daily. [provider] Taking Active   HYDROmorphone (DILAUDID) 8 MG tablet [id number] Yes Take 12 mg by mouth See admin instructions. By mouth every 3 hours as needed for pain. [provider] Taking Active   lactulose (CHRONULAC) 10 GM/15ML solution [id number] Yes Take 10-20 g by mouth 3 (three) times daily as needed for moderate constipation, severe constipation or mild constipation. [provider] Taking Active   levothyroxine (SYNTHROID) 25 MCG tablet [id number] Yes Take 25 mcg by mouth daily before breakfast. [provider] Taking Active   magnesium oxide (MAG-OX) 400 (241.3 Mg) MG tablet [id number] Yes Take 1 tablet (400 mg total) by mouth 2 (two) times daily.  Patient taking differently: Take 400 mg by mouth daily.   Rachel Moulds, MD Taking Active Self  methadone (DOLOPHINE) 10 MG tablet [id number] Yes Take 10-15 mg by mouth See admin instructions. Take 1.5 tablets (15 mg total) by mouth every morning AND 1 tablet (10 mg total) daily after lunch AND 1 tablet (10 mg total) every evening. [provider] Taking Active   Omega-3 Fatty Acids (FISH OIL PO) [id number] Yes Take 1 capsule by mouth daily. [provider] Taking Active Self  omeprazole (PRILOSEC) 20 MG capsule [id number] Yes Take 20 mg by mouth daily. [provider] Taking  Active Self  ondansetron (ZOFRAN) 4 MG tablet [id number] Yes Take 1 tablet (4 mg total) by mouth every 8 (eight) hours as needed for nausea or vomiting. Swinteck, Arlys John, MD Taking Active   ondansetron [facility]) 8 MG tablet [id number] Yes Take 8 mg by mouth every 8  (eight) hours as needed for nausea or vomiting. [provider] Taking Active   oxymetazoline (AFRIN) 0.05 % nasal spray [id number] Yes Place 1-2 sprays into both nostrils as needed for congestion.  [provider] Taking Active Self  polyethylene glycol (MIRALAX) 17 g packet [id number] Yes Take 17 g by mouth daily as needed for moderate constipation or severe constipation. Swinteck, Arlys John, MD Taking Active   pregabalin (LYRICA) 150 MG capsule [id number] Yes Take 1 capsule (150 mg total) by mouth 3 (three) times daily. Reva Bores, MD Taking Active   tamsulosin [facility]) 0.4 MG CAPS capsule [id number] Yes Take 0.4 mg by mouth daily. [provider] Taking Active   traZODone (DESYREL) 50 MG tablet [id number] Yes Take 25 mg by mouth at bedtime as needed (insomnia). [provider] Taking Active   TURMERIC PO [id number] Yes Take 1 capsule by mouth daily. [provider] Taking Active Self            Home Care and Equipment/Supplies: Were Home Health Services Ordered?: No Any new equipment or medical supplies ordered?: No  Functional Questionnaire: Do you need assistance with bathing/showering or dressing?: No Do you need assistance with meal preparation?: No Do you need assistance with eating?: No Do you have difficulty maintaining continence: No Do you need assistance with getting out of bed/getting out of a chair/moving?: No Do you have difficulty managing or taking your medications?: No  Follow up appointments reviewed: PCP Follow-up appointment confirmed?: No MD Provider Line Number:[phone] Given: Yes Specialist Hospital Follow-up appointment confirmed?: Yes Follow-Up Specialty Provider:: [facility] Do you need transportation to your follow-up appointment?: No Do you understand care options if your condition(s) worsen?: Yes-patient verbalized understanding    SIGNATURE  Woodfin Ganja LPN [facility] Nurse [facility]  Advisor Direct Dial [phone]

## 2023-05-05 NOTE — Progress Notes (Signed)
Prior auth for Fentanyl patch  (Key: [id number])  Justin Baldwin is processing your PA request and will respond shortly with next steps. You may close this dialog, return to your dashboard, and perform other tasks. To check for an update later, open this request again from your dashboard.  If you need assistance, please chat with CoverMyMeds or call us at [phone].

## 2023-05-23 ENCOUNTER — Ambulatory Visit: Payer: BC Managed Care – PPO | Admitting: Hematology and Oncology

## 2023-05-26 ENCOUNTER — Other Ambulatory Visit: Payer: Self-pay

## 2023-05-26 ENCOUNTER — Emergency Department (HOSPITAL_BASED_OUTPATIENT_CLINIC_OR_DEPARTMENT_OTHER): Payer: BC Managed Care – PPO | Admitting: Radiology

## 2023-05-26 ENCOUNTER — Encounter (HOSPITAL_BASED_OUTPATIENT_CLINIC_OR_DEPARTMENT_OTHER): Payer: Self-pay | Admitting: Emergency Medicine

## 2023-05-26 ENCOUNTER — Emergency Department (HOSPITAL_BASED_OUTPATIENT_CLINIC_OR_DEPARTMENT_OTHER)
Admission: EM | Admit: 2023-05-26 | Discharge: 2023-05-27 | Disposition: A | Discharge status: Home / Self Care | Payer: BC Managed Care – PPO | Attending: Emergency Medicine | Admitting: Emergency Medicine

## 2023-05-26 ENCOUNTER — Emergency Department (HOSPITAL_BASED_OUTPATIENT_CLINIC_OR_DEPARTMENT_OTHER): Payer: BC Managed Care – PPO

## 2023-05-26 DIAGNOSIS — J90 Pleural effusion, not elsewhere classified: Secondary | ICD-10-CM | POA: Insufficient documentation

## 2023-05-26 DIAGNOSIS — Z7901 Long term (current) use of anticoagulants: Secondary | ICD-10-CM | POA: Diagnosis not present

## 2023-05-26 DIAGNOSIS — R0781 Pleurodynia: Secondary | ICD-10-CM | POA: Diagnosis not present

## 2023-05-26 DIAGNOSIS — R0602 Shortness of breath: Secondary | ICD-10-CM | POA: Diagnosis present

## 2023-05-26 DIAGNOSIS — R600 Localized edema: Secondary | ICD-10-CM

## 2023-05-26 DIAGNOSIS — R109 Unspecified abdominal pain: Secondary | ICD-10-CM | POA: Insufficient documentation

## 2023-05-26 DIAGNOSIS — C799 Secondary malignant neoplasm of unspecified site: Secondary | ICD-10-CM | POA: Diagnosis present

## 2023-05-26 LAB — COMPREHENSIVE METABOLIC PANEL
ALT: 53 U/L — ABNORMAL HIGH (ref 0–44)
AST: 35 U/L (ref 15–41)
Albumin: 2.7 g/dL — ABNORMAL LOW (ref 3.5–5.0)
Alkaline Phosphatase: 160 U/L — ABNORMAL HIGH (ref 38–126)
Anion gap: 11 (ref 5–15)
BUN: 23 mg/dL — ABNORMAL HIGH (ref 6–20)
CO2: 26 mmol/L (ref 22–32)
Calcium: 7.9 mg/dL — ABNORMAL LOW (ref 8.9–10.3)
Chloride: 94 mmol/L — ABNORMAL LOW (ref 98–111)
Creatinine, Ser: 1.21 mg/dL (ref 0.61–1.24)
GFR, Estimated: >60 mL/min (ref >60)
Glucose, Bld: 238 mg/dL — ABNORMAL HIGH (ref 70–99)
Potassium: 3.6 mmol/L (ref 3.5–5.1)
Sodium: 131 mmol/L — ABNORMAL LOW (ref 135–145)
Total Bilirubin: 0.9 mg/dL (ref 0.3–1.2)
Total Protein: 5.5 g/dL — ABNORMAL LOW (ref 6.5–8.1)

## 2023-05-26 LAB — CBC WITH DIFFERENTIAL/PLATELET
Abs Immature Granulocytes: 0.07 10*3/uL (ref 0.00–0.07)
Basophils Absolute: 0 10*3/uL (ref 0.0–0.1)
Basophils Relative: 0 %
Eosinophils Absolute: 0 10*3/uL (ref 0.0–0.5)
Eosinophils Relative: 0 %
HCT: 29.9 % — ABNORMAL LOW (ref 39.0–52.0)
Hemoglobin: 9.3 g/dL — ABNORMAL LOW (ref 13.0–17.0)
Immature Granulocytes: 1 %
Lymphocytes Relative: 4 %
Lymphs Abs: 0.3 10*3/uL — ABNORMAL LOW (ref 0.7–4.0)
MCH: 26.5 pg (ref 26.0–34.0)
MCHC: 31.1 g/dL (ref 30.0–36.0)
MCV: 85.2 fL (ref 80.0–100.0)
Monocytes Absolute: 0.4 10*3/uL (ref 0.1–1.0)
Monocytes Relative: 5 %
Neutro Abs: 6.2 10*3/uL (ref 1.7–7.7)
Neutrophils Relative %: 90 %
Platelets: 111 10*3/uL — ABNORMAL LOW (ref 150–400)
RBC: 3.51 MIL/uL — ABNORMAL LOW (ref 4.22–5.81)
RDW: 19 % — ABNORMAL HIGH (ref 11.5–15.5)
WBC: 6.9 10*3/uL (ref 4.0–10.5)
nRBC: 0 % (ref 0.0–0.2)

## 2023-05-26 LAB — TROPONIN I (HIGH SENSITIVITY)
Troponin I (High Sensitivity): 7 ng/L (ref <18)
Troponin I (High Sensitivity): 8 ng/L (ref <18)

## 2023-05-26 LAB — BRAIN NATRIURETIC PEPTIDE: B Natriuretic Peptide: 81.3 pg/mL (ref 0.0–100.0)

## 2023-05-26 MED ORDER — HYDROMORPHONE HCL 1 MG/ML IJ SOLN
1.0000 mg | Freq: Once | INTRAMUSCULAR | Status: AC
  Administered 2023-05-26: 1 mg via INTRAVENOUS
  Filled 2023-05-26: qty 1

## 2023-05-26 MED ORDER — IOHEXOL 350 MG/ML SOLN
100.0000 mL | Freq: Once | INTRAVENOUS | Status: AC | PRN
  Administered 2023-05-26: 85 mL via INTRAVENOUS

## 2023-05-26 NOTE — ED Notes (Signed)
PT given minimal amount of water. MD aware and given permission for oral fluids

## 2023-05-26 NOTE — ED Provider Notes (Signed)
[place] EMERGENCY DEPARTMENT AT [facility] Provider Note   CSN: [id number] Arrival date & time: 05/26/23  1714     History  Chief Complaint  Justin Baldwin presents with   Shortness of Breath    Justin Baldwin is a 47 y.o. male.  The history is provided by the Justin Baldwin and medical records. No language interpreter was used.  Shortness of Breath Severity:  Moderate Onset quality:  Gradual Duration:  4 days Timing:  Intermittent Progression:  Waxing and waning Chronicity:  New Context: not URI   Relieved by:  Nothing Worsened by:  Deep breathing Ineffective treatments:  None tried Associated symptoms: abdominal pain and chest pain   Associated symptoms: no cough, no diaphoresis, no headaches, no neck pain, no rash, no sputum production, no vomiting and no wheezing   Risk factors: hx of cancer   Risk factors: no hx of PE/DVT        Home Medications Prior to Admission medications   Medication Sig Start Date End Date Taking? Authorizing Provider  Acetaminophen Extra Strength 500 MG TABS Take 2 tablets by mouth every 8 (eight) hours.    [provider]  anastrozole (ARIMIDEX) 1 MG tablet Take 1 mg by mouth once a week. 11/02/21   [provider]  apixaban (ELIQUIS) 2.5 MG TABS tablet Take 1 tablet (2.5 mg total) by mouth 2 (two) times daily. 06/02/22   Swinteck, Arlys John, MD  calcium carbonate (TUMS - DOSED IN MG ELEMENTAL CALCIUM) 500 MG chewable tablet Chew 1 tablet by mouth daily. 03/15/23   [provider]  cyanocobalamin 1000 MCG tablet Take 1 tablet (1,000 mcg total) by mouth daily. 05/01/23   Reva Bores, MD  famotidine (PEPCID) 20 MG tablet TAKE 1 TABLET BY MOUTH ONCE DAILY For heartburn 08/04/21   Doreene Nest, NP  fentaNYL (DURAGESIC) 75 MCG/HR Place 1 patch onto the skin every 3 (three) days. 05/02/23   Reva Bores, MD  folic acid (FOLVITE) 1 MG tablet Take 1 tablet (1 mg total) by mouth daily. 05/01/23   Reva Bores, MD  GNP VITAMIN D  MAXIMUM STRENGTH 50 MCG (2000 UT) TABS Take 1 tablet by mouth daily. 03/15/23   [provider]  HYDROmorphone (DILAUDID) 8 MG tablet Take 12 mg by mouth See admin instructions. By mouth every 3 hours as needed for pain.    [provider]  lactulose (CHRONULAC) 10 GM/15ML solution Take 10-20 g by mouth 3 (three) times daily as needed for moderate constipation, severe constipation or mild constipation. 03/08/23   [provider]  levothyroxine (SYNTHROID) 25 MCG tablet Take 25 mcg by mouth daily before breakfast. 03/20/23 03/19/24  [provider]  magnesium oxide (MAG-OX) 400 (241.3 Mg) MG tablet Take 1 tablet (400 mg total) by mouth 2 (two) times daily. Justin Baldwin taking differently: Take 400 mg by mouth daily. 01/13/21   Rachel Moulds, MD  methadone (DOLOPHINE) 10 MG tablet Take 10-15 mg by mouth See admin instructions. Take 1.5 tablets (15 mg total) by mouth every morning AND 1 tablet (10 mg total) daily after lunch AND 1 tablet (10 mg total) every evening.    [provider]  Omega-3 Fatty Acids (FISH OIL PO) Take 1 capsule by mouth daily.    [provider]  omeprazole (PRILOSEC) 20 MG capsule Take 20 mg by mouth daily. 03/18/21   [provider]  ondansetron (ZOFRAN) 4 MG tablet Take 1 tablet (4 mg total) by mouth every 8 (eight)  hours as needed for nausea or vomiting. 06/02/22   Swinteck, Arlys John, MD  ondansetron (ZOFRAN) 8 MG tablet Take 8 mg by mouth every 8 (eight) hours as needed for nausea or vomiting. 02/23/23   [provider]  oxymetazoline (AFRIN) 0.05 % nasal spray Place 1-2 sprays into both nostrils as needed for congestion.     [provider]  polyethylene glycol (MIRALAX) 17 g packet Take 17 g by mouth daily as needed for moderate constipation or severe constipation. 06/02/22   Swinteck, Arlys John, MD  pregabalin (LYRICA) 150 MG capsule Take 1 capsule (150 mg total) by mouth 3 (three) times daily. 04/30/23   Reva Bores, MD  tamsulosin (FLOMAX) 0.4 MG CAPS capsule Take 0.4 mg by mouth daily. 08/26/22 03/21/24  [provider]  traZODone (DESYREL) 50 MG tablet Take 25 mg by mouth at bedtime as needed (insomnia). 03/22/23   [provider]  TURMERIC PO Take 1 capsule by mouth daily.    [provider]      Allergies    Prednisone    Review of Systems   Review of Systems  Constitutional:  Negative for chills, diaphoresis and fatigue.  HENT:  Negative for congestion.   Respiratory:  Positive for chest tightness and shortness of breath. Negative for cough, sputum production, wheezing and stridor.   Cardiovascular:  Positive for chest pain and leg swelling. Negative for palpitations.  Gastrointestinal:  Positive for abdominal pain and constipation. Negative for diarrhea, nausea and vomiting.  Genitourinary:  Negative for dysuria.  Musculoskeletal:  Negative for back pain, neck pain and neck stiffness.  Skin:  Negative for rash and wound.  Neurological:  Negative for headaches.  Psychiatric/Behavioral:  Negative for agitation and confusion.   All other systems reviewed and are negative.   Physical Exam Updated Vital Signs BP 111/77 (BP Location: Right Arm)   Pulse 78   Temp 98 F (36.7 C)   Resp 20   Ht 6' (1.829 m)   Wt 89.4 kg   SpO2 98%   BMI 26.75 kg/m  Physical Exam Vitals and nursing note reviewed.  Constitutional:      General: Justin Baldwin is not in acute distress.    Appearance: Justin Baldwin is well-developed. Justin Baldwin is not ill-appearing, toxic-appearing or diaphoretic.  HENT:     Head: Normocephalic and atraumatic.  Eyes:     Conjunctiva/sclera: Conjunctivae normal.     Pupils: Pupils are equal, round, and reactive to light.  Neck:     Vascular: No carotid bruit.  Cardiovascular:     Rate and Rhythm: Normal rate and regular rhythm.     Heart sounds: No murmur heard. Pulmonary:     Effort: Pulmonary effort is normal. No tachypnea or respiratory distress.     Breath sounds:  Normal breath sounds. No stridor. No wheezing, rhonchi or rales.  Chest:     Chest wall: No tenderness.  Abdominal:     Palpations: Abdomen is soft.     Tenderness: There is no abdominal tenderness.  Musculoskeletal:        General: No swelling.     Cervical back: Neck supple. No rigidity or tenderness.     Right lower leg: Edema present.     Left lower leg: Edema present.  Skin:    General: Skin is warm and dry.     Capillary Refill: Capillary refill takes less than 2 seconds.     Findings: No erythema.  Neurological:     General: No focal  deficit present.     Mental Status: Justin Baldwin is alert.  Psychiatric:        Mood and Affect: Mood normal.     ED Results / Procedures / Treatments   Labs (all labs ordered are listed, but only abnormal results are displayed) Labs Reviewed  CBC WITH DIFFERENTIAL/PLATELET - Abnormal; Notable for the following components:      Result Value   RBC 3.51 (*)    Hemoglobin 9.3 (*)    HCT 29.9 (*)    RDW 19.0 (*)    Platelets 111 (*)    Lymphs Abs 0.3 (*)    All other components within normal limits  COMPREHENSIVE METABOLIC PANEL - Abnormal; Notable for the following components:   Sodium 131 (*)    Chloride 94 (*)    Glucose, Bld 238 (*)    BUN 23 (*)    Calcium 7.9 (*)    Total Protein 5.5 (*)    Albumin 2.7 (*)    ALT 53 (*)    Alkaline Phosphatase 160 (*)    All other components within normal limits  BRAIN NATRIURETIC PEPTIDE  TROPONIN I (HIGH SENSITIVITY)  TROPONIN I (HIGH SENSITIVITY)    EKG EKG Interpretation Date/Time:  Thursday May 26 2023 17:22:13 EDT Ventricular Rate:  78 PR Interval:  128 QRS Duration:  90 QT Interval:  400 QTC Calculation: 456 R Axis:   58  Text Interpretation: Normal sinus rhythm Nonspecific T wave abnormality Abnormal ECG When compared with ECG of 03-Feb-2023 23:35, PREVIOUS ECG IS PRESENT when compared to prior, overall similar appearance. No STEMI Confirmed by Theda Belfast ([id number]) on 05/26/2023  5:24:16 PM  Radiology CT Angio Chest PE W and/or Wo Contrast  Result Date: 05/26/2023 CLINICAL DATA:  Acute abdominal pain. Cancer history. Chest pain and shortness of breath. EXAM: CT ANGIOGRAPHY CHEST CT ABDOMEN AND PELVIS WITH CONTRAST TECHNIQUE: Multidetector CT imaging of the chest was performed using the standard protocol during bolus administration of intravenous contrast. Multiplanar CT image reconstructions and MIPs were obtained to evaluate the vascular anatomy. Multidetector CT imaging of the abdomen and pelvis was performed using the standard protocol during bolus administration of intravenous contrast. RADIATION DOSE REDUCTION: This exam was performed according to the departmental dose-optimization program which includes automated exposure control, adjustment of the mA and/or kV according to Justin Baldwin size and/or use of iterative reconstruction technique. CONTRAST:  85mL OMNIPAQUE IOHEXOL 350 MG/ML SOLN COMPARISON:  CT chest abdomen and pelvis 03/01/2023 FINDINGS: CTA CHEST FINDINGS Cardiovascular: Satisfactory opacification of the pulmonary arteries to the segmental level. No evidence of pulmonary embolism. Normal heart size. No pericardial effusion. Left chest port catheter tip ends at the cavoatrial junction. Mediastinum/Nodes: There is new diffuse mediastinal lymphadenopathy and hilar adenopathy. The largest prevascular lymph node measures 5.0 x 7.1 cm. The largest paratracheal lymph node measures 3.1 by 3.4 cm. The largest right hilar lymph node measures 2.9 by 2.0 cm. The largest left hilar lymph node measures 2.6 by 2.2 cm. Enlarged subcarinal lymph node measures 14 mm short axis. Visualized esophagus and thyroid gland are within normal limits. Lungs/Pleura: There are numerous new bilateral pulmonary nodules measuring up to 2.7 cm. There are trace bilateral pleural effusions. Musculoskeletal: Orthopedic plate is seen in the proximal left femur. Again seen is pathologic fracture of T3 similar to  the prior study. Again seen is osseous metastatic lesion in the left eighth rib. The soft tissue component has mildly increased in size. Review of the MIP images confirms the above  findings. CT ABDOMEN and PELVIS FINDINGS Hepatobiliary: Diffuse osseous metastatic disease has significantly increased. The largest confluence masses in the right liver measuring 14 by 14 cm (previously 8.4 x 6.4 cm). Gallbladder and bile ducts are within normal limits. Pancreas: Unremarkable. No pancreatic ductal dilatation or surrounding inflammatory changes. Spleen: Normal in size without focal abnormality. Adrenals/Urinary Tract: Left renal cyst measuring 1.7 cm is unchanged. Otherwise, the kidneys, adrenal glands and bladder are within normal limits. Stomach/Bowel: No evidence of bowel wall thickening, distention, or inflammatory changes. The appendix is not seen. There is sigmoid colon diverticulosis. Vascular/Lymphatic: New left retrocrural soft tissue density may represent enlarged lymph node image 2/16 measuring 2.6 x 4.7 cm. No other enlarged abdominal or pelvic lymph nodes are seen. Aorta and IVC are normal in size. Reproductive: Prostate is unremarkable. Other: There is a small fat containing umbilical hernia. There is no ascites. Musculoskeletal: Lytic lesion in the left sacrum appears unchanged. Right hip arthroplasty is present. Healed left inferior pubic ramus fracture is unchanged. Lytic lesion in the left ischium is stable. Review of the MIP images confirms the above findings. IMPRESSION: 1. No evidence for pulmonary embolism. 2. New diffuse marked mediastinal and hilar lymphadenopathy worrisome for metastatic disease. 3. Numerous new bilateral pulmonary nodules worrisome for metastatic disease. 4. Trace bilateral pleural effusions. 5. Significant interval worsening of diffuse osseous metastatic disease. 6. Significant interval enlargement of hepatic metastatic lesions. 7. New left retrocrural soft tissue density may  represent enlarged lymph node. Electronically Signed   By: Darliss Cheney M.D.   On: 05/26/2023 20:19   CT ABDOMEN PELVIS W CONTRAST  Result Date: 05/26/2023 CLINICAL DATA:  Acute abdominal pain. Cancer history. Chest pain and shortness of breath. EXAM: CT ANGIOGRAPHY CHEST CT ABDOMEN AND PELVIS WITH CONTRAST TECHNIQUE: Multidetector CT imaging of the chest was performed using the standard protocol during bolus administration of intravenous contrast. Multiplanar CT image reconstructions and MIPs were obtained to evaluate the vascular anatomy. Multidetector CT imaging of the abdomen and pelvis was performed using the standard protocol during bolus administration of intravenous contrast. RADIATION DOSE REDUCTION: This exam was performed according to the departmental dose-optimization program which includes automated exposure control, adjustment of the mA and/or kV according to Justin Baldwin size and/or use of iterative reconstruction technique. CONTRAST:  85mL OMNIPAQUE IOHEXOL 350 MG/ML SOLN COMPARISON:  CT chest abdomen and pelvis 03/01/2023 FINDINGS: CTA CHEST FINDINGS Cardiovascular: Satisfactory opacification of the pulmonary arteries to the segmental level. No evidence of pulmonary embolism. Normal heart size. No pericardial effusion. Left chest port catheter tip ends at the cavoatrial junction. Mediastinum/Nodes: There is new diffuse mediastinal lymphadenopathy and hilar adenopathy. The largest prevascular lymph node measures 5.0 x 7.1 cm. The largest paratracheal lymph node measures 3.1 by 3.4 cm. The largest right hilar lymph node measures 2.9 by 2.0 cm. The largest left hilar lymph node measures 2.6 by 2.2 cm. Enlarged subcarinal lymph node measures 14 mm short axis. Visualized esophagus and thyroid gland are within normal limits. Lungs/Pleura: There are numerous new bilateral pulmonary nodules measuring up to 2.7 cm. There are trace bilateral pleural effusions. Musculoskeletal: Orthopedic plate is seen in the  proximal left femur. Again seen is pathologic fracture of T3 similar to the prior study. Again seen is osseous metastatic lesion in the left eighth rib. The soft tissue component has mildly increased in size. Review of the MIP images confirms the above findings. CT ABDOMEN and PELVIS FINDINGS Hepatobiliary: Diffuse osseous metastatic disease has significantly increased. The largest confluence masses  in the right liver measuring 14 by 14 cm (previously 8.4 x 6.4 cm). Gallbladder and bile ducts are within normal limits. Pancreas: Unremarkable. No pancreatic ductal dilatation or surrounding inflammatory changes. Spleen: Normal in size without focal abnormality. Adrenals/Urinary Tract: Left renal cyst measuring 1.7 cm is unchanged. Otherwise, the kidneys, adrenal glands and bladder are within normal limits. Stomach/Bowel: No evidence of bowel wall thickening, distention, or inflammatory changes. The appendix is not seen. There is sigmoid colon diverticulosis. Vascular/Lymphatic: New left retrocrural soft tissue density may represent enlarged lymph node image 2/16 measuring 2.6 x 4.7 cm. No other enlarged abdominal or pelvic lymph nodes are seen. Aorta and IVC are normal in size. Reproductive: Prostate is unremarkable. Other: There is a small fat containing umbilical hernia. There is no ascites. Musculoskeletal: Lytic lesion in the left sacrum appears unchanged. Right hip arthroplasty is present. Healed left inferior pubic ramus fracture is unchanged. Lytic lesion in the left ischium is stable. Review of the MIP images confirms the above findings. IMPRESSION: 1. No evidence for pulmonary embolism. 2. New diffuse marked mediastinal and hilar lymphadenopathy worrisome for metastatic disease. 3. Numerous new bilateral pulmonary nodules worrisome for metastatic disease. 4. Trace bilateral pleural effusions. 5. Significant interval worsening of diffuse osseous metastatic disease. 6. Significant interval enlargement of hepatic  metastatic lesions. 7. New left retrocrural soft tissue density may represent enlarged lymph node. Electronically Signed   By: Darliss Cheney M.D.   On: 05/26/2023 20:19   DG Chest 2 View  Result Date: 05/26/2023 CLINICAL DATA:  Shortness of breath EXAM: CHEST - 2 VIEW COMPARISON:  X-ray 09/03/2019.  CT scan 03/01/2023 FINDINGS: Tiny left effusion. No right-sided effusion. No consolidation, pneumothorax or edema. Normal cardiac silhouette. There is a mass suprahilar on the left which is much larger than the prior CT scan measuring up to 7.5 cm on this examination. Additional nodular areas identified elsewhere in the anterior thorax on the lateral view. Left IJ chest port with the tip along the right atrium. There is a irregularity as well along the left-sided lower rib lateral. This has seen on prior CT. IMPRESSION: Chest port. Tiny left effusion. Increasing left-sided mediastinal, hilar mass. Additional lung nodules. Electronically Signed   By: Karen Kays M.D.   On: 05/26/2023 18:04    Procedures Procedures    Medications Ordered in ED Medications  iohexol (OMNIPAQUE) 350 MG/ML injection 100 mL (85 mLs Intravenous Contrast Given 05/26/23 1940)  HYDROmorphone (DILAUDID) injection 1 mg (1 mg Intravenous Given 05/26/23 2226)    ED Course/ Medical Decision Making/ A&P                                 Medical Decision Making Amount and/or Complexity of Data Reviewed Labs: ordered. Radiology: ordered.  Risk Prescription drug management. Decision regarding hospitalization.   BROM FLORIDA is a 47 y.o. male with a past medical history significant for throat cancer with diffuse metastasis who presents with 4 days of worsening peripheral edema, shortness of breath, pleuritic chest pain, and upper abdominal discomfort.  Justin Baldwin reports she has not had nausea or vomiting and only has chronic constipation.  No diarrhea.  Denies urinary changes.  Denies history of any blood clots and reports Justin Baldwin is not on  Eliquis, was only on it during a recent admission.  Justin Baldwin denies any fevers, chills, congestion, and denies any productive cough.  Denies any sick contacts.  Justin Baldwin denies any trauma.  Reports Justin Baldwin is having pleuritic shortness of breath and chest discomfort moving the left side of his chest.  Justin Baldwin thinks everything is starting to swell from his legs to his abdomen to his lower chest.  Justin Baldwin reports Justin Baldwin is never had a history of heart failure or history of blood clots.  Justin Baldwin spoke to his oncologist told him to come to the emergency department for evaluation.  On exam, lungs surprisingly were relatively clear.  Did not hear significant rales or rhonchi.  Chest was nontender.  Upper abdomen slightly tender.  Bowel sounds were appreciated.  Back and flanks nontender initially.  Pulses present in extremities but Justin Baldwin does have significant edema in both legs.  Justin Baldwin reports this is all new in the last 4 days.  Intact upper extremity pulses without significant edema in the arms.  Justin Baldwin reports Justin Baldwin had some discomfort feeling in his right supraclavicular area and right neck that has resolved.  I did not appreciate a carotid bruit.  Clinically I am concerned about fluid overload, pulm embolism, or some mechanical obstruction from these tumors in his torso.  An x-ray was done in triage showing significant growth of a chest mass.  Will get a CT PE study that we will also look at this mass and will get a CT abdomen pelvis with his reported upper abdominal discomfort and some feeling of distention and swelling.  Anticipate admission after workup is completed given the progression of his problem.  10:23 PM I reviewed the images on the CT scan and I am concerned about diffuse metastatic worsening.  I spoke to radiology on the phone and they do agree that there could be some compression of the IVC causing some of this peripheral edema compared to several months ago.  There is also pleural effusions and I think his edema is leading to some of the  shortness of breath.  Although Justin Baldwin is not hypoxic, I do feel Justin Baldwin needs to be admitted for pain control, shortness of breath with progressive peripheral edema, and if symptoms do not start to improve, may need transfer to [facility] where Justin Baldwin gets his oncologic care.  There was no pulmonary embolism and I am not convinced about acute infection.  Justin Baldwin admitted for further management.         Final Clinical Impression(s) / ED Diagnoses Final diagnoses:  Pleuritic chest pain  Abdominal pain, unspecified abdominal location  Peripheral edema  Pleural effusion  Shortness of breath    Clinical Impression: 1. Pleuritic chest pain   2. Abdominal pain, unspecified abdominal location   3. Peripheral edema   4. Pleural effusion   5. Shortness of breath     Disposition: Admit  This note was prepared with assistance of Dragon voice recognition software. Occasional wrong-word or sound-a-like substitutions may have occurred due to the inherent limitations of voice recognition software.      Zeffie Bickert, Canary Brim, MD 05/26/23 2337

## 2023-05-26 NOTE — ED Notes (Signed)
Patient requesting port accessed for labs.

## 2023-05-26 NOTE — Progress Notes (Signed)
Plan of Care Note for accepted transfer   Patient: Justin Baldwin MRN: [id number]   DOA: 05/26/2023  Facility requesting transfer: [facility] Requesting Provider: Dr. Rush Landmark Reason for transfer: Dyspnea and peripheral edema in the setting worsening metastatic disease Facility course: 47 year old male with history of metastatic tonsillar SCC, diabetes, GERD, erectile dysfunction, arthritis, hypothyroidism presented to ED with shortness of breath, pleuritic chest pain, and peripheral edema.  Vital signs stable.  Labs showing no leukocytosis, hemoglobin 9.3 (was 10.4 in July 2024 and 9.6 in April 2024), MCV 85.2, platelet count 111k, sodium 131, chloride 94, glucose 238, bicarb 26, creatinine 1.2, calcium 7.9, albumin 2.7, ALT 53, alk phos 160, AST and T. bili normal, BNP normal, initial troponin negative and repeat pending.  EKG not suggestive of STEMI.  CTA chest/CT abdomen pelvis showing: "IMPRESSION: 1. No evidence for pulmonary embolism. 2. New diffuse marked mediastinal and hilar lymphadenopathy worrisome for metastatic disease. 3. Numerous new bilateral pulmonary nodules worrisome for metastatic disease. 4. Trace bilateral pleural effusions. 5. Significant interval worsening of diffuse osseous metastatic disease. 6. Significant interval enlargement of hepatic metastatic lesions. 7. New left retrocrural soft tissue density may represent enlarged lymph node."  Patient received IV Dilaudid in the ED.  Plan of care: The patient is accepted for admission to Telemetry unit, at [facility]..   [facility] will assume care on arrival to accepting facility. Until arrival, care as per EDP. However, [facility] available 24/7 for questions and assistance.  Author: John Giovanni, MD 05/26/2023  Check [email or web address] for on-call coverage.  Nursing staff, Please call [facility] Admits & Consults System-Wide number on Amion as soon as patient's arrival, so appropriate admitting provider can evaluate the  pt.

## 2023-05-26 NOTE — ED Triage Notes (Signed)
Patient reports his oncologist told him to come to the ER because he has fluid on his lungs and is shob.

## 2023-05-27 MED ORDER — HEPARIN SOD (PORK) LOCK FLUSH 100 UNIT/ML IV SOLN
500.0000 [IU] | Freq: Once | INTRAVENOUS | Status: AC
  Administered 2023-05-27: 500 [IU]
  Filled 2023-05-27: qty 5

## 2023-05-27 MED ORDER — HYDROMORPHONE HCL 1 MG/ML IJ SOLN
0.5000 mg | INTRAMUSCULAR | Status: DC | PRN
  Administered 2023-05-27 (×2): 0.5 mg via INTRAVENOUS
  Filled 2023-05-27 (×2): qty 1

## 2023-05-27 NOTE — ED Notes (Signed)
Spoke to patient, states he would prefer to leave AMA and go to a hospital that works with his current oncology team. EDP aware. Pt aware of risks. Port de-accessed for transport.

## 2023-05-27 NOTE — ED Provider Notes (Signed)
I assumed care of this patient from previous provider.  Please see their note for further details of history, exam, and MDM.   Briefly patient is a 47 y.o. male who presented shortness of breath.  Patient with a history of metastatic cancer.  Workup revealed worsening tumor burden likely contributing to his pain and shortness of breath.  No PE is noted.  Patient was then to be admitted for additional pain control with diuresing.  He was awaiting bed at [facility] long.  After some pain treatment, patient chose to be discharged home.  States that he will follow-up with his oncologist for further management.  Patient does not appear to be in any acute respiratory distress and appears stable for discharge.   The patient appears reasonably screened and/or stabilized for discharge and I doubt any other medical condition or other [facility] requiring further screening, evaluation, or treatment in the ED at this time. I have discussed the findings, Dx and Tx plan with the patient/family who expressed understanding and agree(s) with the plan. Discharge instructions discussed at length. The patient/family was given strict return precautions who verbalized understanding of the instructions. No further questions at time of discharge.  Disposition: Discharge  Condition: Good  ED Discharge Orders     None        Follow Up: Doreene Nest, NP [address] [place] [address] [phone]  Call  as needed  [facility] oncology  Call  to schedule an appointment for close follow up         , Amadeo Garnet, MD 05/27/23 [phone]

## 2023-05-27 NOTE — ED Notes (Signed)
Pt ambulated to restroom without assistance.

## 2023-06-26 DEATH — deceased
